# Patient Record
Sex: Male | Born: 1950 | Race: White | Hispanic: No | Smoking: Never smoker
Health system: Southern US, Community
[De-identification: ages and names within clinical notes are randomized; demographics above are authoritative.]

## PROBLEM LIST (undated history)

## (undated) DIAGNOSIS — Z9289 Personal history of other medical treatment: Secondary | ICD-10-CM

## (undated) DIAGNOSIS — E039 Hypothyroidism, unspecified: Secondary | ICD-10-CM

## (undated) DIAGNOSIS — F32A Depression, unspecified: Secondary | ICD-10-CM

## (undated) DIAGNOSIS — I712 Thoracic aortic aneurysm, without rupture, unspecified: Secondary | ICD-10-CM

## (undated) DIAGNOSIS — F1290 Cannabis use, unspecified, uncomplicated: Secondary | ICD-10-CM

## (undated) DIAGNOSIS — C8330 Diffuse large B-cell lymphoma, unspecified site: Secondary | ICD-10-CM

## (undated) DIAGNOSIS — R351 Nocturia: Secondary | ICD-10-CM

## (undated) DIAGNOSIS — F329 Major depressive disorder, single episode, unspecified: Secondary | ICD-10-CM

## (undated) DIAGNOSIS — I10 Essential (primary) hypertension: Secondary | ICD-10-CM

## (undated) DIAGNOSIS — I739 Peripheral vascular disease, unspecified: Secondary | ICD-10-CM

## (undated) DIAGNOSIS — Z9109 Other allergy status, other than to drugs and biological substances: Secondary | ICD-10-CM

## (undated) DIAGNOSIS — F419 Anxiety disorder, unspecified: Secondary | ICD-10-CM

## (undated) DIAGNOSIS — M549 Dorsalgia, unspecified: Secondary | ICD-10-CM

## (undated) DIAGNOSIS — Z9221 Personal history of antineoplastic chemotherapy: Secondary | ICD-10-CM

## (undated) DIAGNOSIS — D649 Anemia, unspecified: Secondary | ICD-10-CM

## (undated) DIAGNOSIS — Z8709 Personal history of other diseases of the respiratory system: Secondary | ICD-10-CM

## (undated) DIAGNOSIS — K5900 Constipation, unspecified: Secondary | ICD-10-CM

## (undated) DIAGNOSIS — R350 Frequency of micturition: Secondary | ICD-10-CM

## (undated) HISTORY — PX: RHINOPLASTY: SUR1284

## (undated) HISTORY — DX: Dorsalgia, unspecified: M54.9

## (undated) HISTORY — DX: Depression, unspecified: F32.A

## (undated) HISTORY — PX: OTHER SURGICAL HISTORY: SHX169

## (undated) HISTORY — DX: Major depressive disorder, single episode, unspecified: F32.9

## (undated) HISTORY — DX: Anxiety disorder, unspecified: F41.9

## (undated) HISTORY — PX: BONE MARROW BIOPSY: SHX199

## (undated) HISTORY — DX: Diffuse large B-cell lymphoma, unspecified site: C83.30

## (undated) HISTORY — PX: HERNIA REPAIR: SHX51

---

## 2012-06-04 ENCOUNTER — Ambulatory Visit (INDEPENDENT_AMBULATORY_CARE_PROVIDER_SITE_OTHER): Payer: BC Managed Care – PPO | Admitting: Psychiatry

## 2012-06-04 VITALS — BP 130/80 | HR 68 | Resp 12 | Ht 73.0 in | Wt 206.0 lb

## 2012-06-04 DIAGNOSIS — F4321 Adjustment disorder with depressed mood: Secondary | ICD-10-CM

## 2012-06-04 DIAGNOSIS — F4323 Adjustment disorder with mixed anxiety and depressed mood: Secondary | ICD-10-CM

## 2012-06-04 MED ORDER — LORAZEPAM 1 MG PO TABS: 1.0000 mg | ORAL_TABLET | Freq: Three times a day (TID) | ORAL | Status: DC
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
## 2012-06-25 ENCOUNTER — Telehealth (HOSPITAL_COMMUNITY): Payer: Self-pay

## 2012-06-25 NOTE — Telephone Encounter (Signed)
8:09am 06/25/12 Recd vm from 058/29 @ 5:10pm statign that pt needed to r/s an appt ..returned call but recd a fast busy..will try again later/sh

## 2012-07-13 ENCOUNTER — Ambulatory Visit (HOSPITAL_COMMUNITY): Payer: BC Managed Care – PPO | Admitting: Licensed Clinical Social Worker

## 2012-08-10 ENCOUNTER — Ambulatory Visit (INDEPENDENT_AMBULATORY_CARE_PROVIDER_SITE_OTHER): Payer: BC Managed Care – PPO | Admitting: Licensed Clinical Social Worker

## 2012-08-10 ENCOUNTER — Encounter (HOSPITAL_COMMUNITY): Payer: Self-pay | Admitting: Licensed Clinical Social Worker

## 2012-08-10 DIAGNOSIS — F3289 Other specified depressive episodes: Secondary | ICD-10-CM

## 2012-08-10 DIAGNOSIS — F1210 Cannabis abuse, uncomplicated: Secondary | ICD-10-CM

## 2012-08-10 DIAGNOSIS — F329 Major depressive disorder, single episode, unspecified: Secondary | ICD-10-CM

## 2012-08-10 DIAGNOSIS — F429 Obsessive-compulsive disorder, unspecified: Secondary | ICD-10-CM | POA: Insufficient documentation
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
## 2012-08-12 ENCOUNTER — Ambulatory Visit (HOSPITAL_COMMUNITY): Payer: BC Managed Care – PPO | Admitting: Licensed Clinical Social Worker

## 2012-08-31 ENCOUNTER — Ambulatory Visit (HOSPITAL_COMMUNITY): Payer: BC Managed Care – PPO | Admitting: Licensed Clinical Social Worker

## 2012-09-02 ENCOUNTER — Ambulatory Visit (INDEPENDENT_AMBULATORY_CARE_PROVIDER_SITE_OTHER): Payer: BC Managed Care – PPO | Admitting: Licensed Clinical Social Worker

## 2012-09-02 DIAGNOSIS — F429 Obsessive-compulsive disorder, unspecified: Secondary | ICD-10-CM

## 2012-09-02 DIAGNOSIS — F329 Major depressive disorder, single episode, unspecified: Secondary | ICD-10-CM

## 2012-09-02 NOTE — Progress Notes (Signed)
   THERAPIST PROGRESS NOTE  Session Time: 4:00pm-4:50pm  Participation Level: Active  Behavioral Response: Well GroomedAlertAnxious and Depressed  Type of Therapy: Individual Therapy  Treatment Goals addressed: Coping  Interventions: CBT, Motivational Interviewing, Strength-based, Supportive and Reframing  Summary: Mayjor Ager III is a 62 y.o. male who presents with depressed mood and anxious affect. He reports ongoing depression and anxiety. He processes his feelings of frustration about living with his sister, parenting his son who is not behaving responsibly and dealing with his girlfriend. He is not happy with his romantic relationship and processes the possibility of ending it. He questions his own desires and ability to set clear boundaries in relationships. He endorses strong feelings of guilt over his parents deaths and not being with them when they died. He processes his grief. His sleep and appetite are wnl.    Suicidal/Homicidal: Nowithout intent/plan  Therapist Response: Assessed patients current functioning and reviewed progress. Reviewed coping strategies. Assessed patients safety and assisted in identifying protective factors.  Reviewed crisis plan with patient. Assisted patient with the expression of anxiety. Reviewed patients self care plan. Assessed progress related to self care. Patients self care is good. Recommend daily exercise, increased socialization and recreation. Used motivational interviewing to assist and encourage patient through the change process. Explored patients barriers to change. Used CBT to assist patient with the identification of negative distortions and irrational thoughts. Encouraged patient to verbalize alternative and factual responses which challenge thought distortions. Used DBT to practice mindfulness, review distraction list and improve distress tolerance skills.   Plan: Return again in two weeks.  Diagnosis: Axis I: Depressive Disorder NOS  and Obsessive Compulsive Disorder    Axis II: No diagnosis    Orbin Mayeux, LCSW 09/02/2012

## 2012-09-15 ENCOUNTER — Ambulatory Visit (INDEPENDENT_AMBULATORY_CARE_PROVIDER_SITE_OTHER): Payer: BC Managed Care – PPO | Admitting: Psychiatry

## 2012-09-15 DIAGNOSIS — F4322 Adjustment disorder with anxiety: Secondary | ICD-10-CM

## 2012-09-15 MED ORDER — LORAZEPAM 1 MG PO TABS: 1.0000 mg | ORAL_TABLET | Freq: Three times a day (TID) | ORAL | Status: DC

## 2012-09-15 MED ORDER — ZOLPIDEM TARTRATE 10 MG PO TABS: 10.0000 mg | ORAL_TABLET | Freq: Every evening | ORAL | Status: DC | PRN

## 2012-09-15 NOTE — Progress Notes (Signed)
[facility] MD Progress Note  09/15/2012 3:40 PM Douglas Bryant  MRN:  [id number] Subjective:  Better Today the patient showed up on time and was seen alone. At this time he is somewhat better. As he says that he doesn't feels freaked out or as anxious as he was when he saw me last. He essentially is done better adjusted to his living circumstance living with his handicapped sister and his teenage son. The other dynamic course is that he was living in [place] near his girlfriend. He is somewhat ambivalent however about his girlfriend. Today the patient denies being severely depressed. He is inconsistent about how often he feels depressed. His anxiety seems to be somewhat stable at this time. He was having great problems sleeping but his primary care doctor gave him Ambien which helps him a great deal. Resonant patient is in therapy and is doing well with Orvan July. The patient who technology smoking marijuana on a rare basis as claim that is beginning to make him paranoia. He is now bleeding it is marijuana days are coming to an end. At this time his alcohol marijuana. The patient is eating well can concentrate without problems and has had no change in his energy level. He denies being suicidal. He still enjoys reading watching TV and occasionally seeing his girlfriend from [place]. Over the next few months the patient will be making some big decisions. Namely this is around the issue with his son. His son is 18 his footwear water regularly and apparently has not committed to school or to work. The patient is somewhat down on himself yet in reality I reviewed with him that he accident nothing wrong. In fact just the opposite. The patient is here because is the only way he can take care of his son and his sister.today reviewed all his medications and I fact offered to give him his Ambien. At this time he does not drink much alcohol at all. Not use any other drugs other than now reducing amounts of marijuana.  Patient also is to make a decision about his financial situation over the next few months. The patient is looking for primary care doctor at this time. The patient denies any shortness of breath or chest pain and denies any neurological symptoms. Diagnosis:   DSM5: Schizophrenia Disorders:   Obsessive-Compulsive Disorders:   Trauma-Stressor Disorders:   Substance/Addictive Disorders:   Depressive Disorders:    Axis I: Adjustment Disorder with Anxiety  ADL's:  Intact  Sleep: Fair  Appetite:  Good  Suicidal Ideation:  no Homicidal Ideation:  none AEB (as evidenced by):  Psychiatric Specialty Exam: ROS  Blood pressure 133/91, pulse 70, height 6\' 1"  (1.854 m), weight 199 lb 12.8 oz (90.629 kg).Body mass index is 26.37 kg/(m^2).  General Appearance: Casual  Eye Contact::  Good  Speech:  Normal Rate  Volume:  Normal  Mood:  Euthymic  Affect:  Appropriate  Thought Process:  Goal Directed  Orientation:  Full (Time, Place, and Person)  Thought Content:  WDL  Suicidal Thoughts:  No  Homicidal Thoughts:  No  Memory:  nl  Judgement:  Good  Insight:  Good  Psychomotor Activity:  Normal  Concentration:  Good  Recall:  Good  Akathisia:  No  Handed:  Right  AIMS (if indicated):     Assets:  Desire for Improvement  Sleep:      Current Medications: Current Outpatient Prescriptions  Medication Sig Dispense Refill  . LORazepam (ATIVAN) 1 MG tablet Take 1  tablet (1 mg total) by mouth 3 (three) times daily.  90 tablet  4  . LORazepam (ATIVAN) 1 MG tablet Take 1 tablet (1 mg total) by mouth 3 (three) times daily.  90 tablet  4  . zolpidem (AMBIEN) 10 MG tablet Take 1 tablet (10 mg total) by mouth at bedtime as needed for sleep.  30 tablet  4   No current facility-administered medications for this visit.    Lab Results: No results found for this or any previous visit (from the past 48 hour(s)).  Physical Findings: AIMS:  , ,  ,  ,    CIWA:    COWS:     Treatment Plan  Summary: At this time I will prescribe Ambien 10 mg at night. I will continue his Ativan 1 mg 3 times a day. The patient will continue in therapy. This patient return to see me in 3 months.  Plan:  Medical Decision Making Problem Points:  Established problem, stable/improving (1) Data Points:  Review of new medications or change in dosage (2)  I certify that inpatient services furnished can reasonably be expected to improve the patient's condition.   Kherington Meraz IRVING 09/15/2012, 3:40 PM

## 2012-09-16 ENCOUNTER — Ambulatory Visit (INDEPENDENT_AMBULATORY_CARE_PROVIDER_SITE_OTHER): Payer: BC Managed Care – PPO | Admitting: Licensed Clinical Social Worker

## 2012-09-16 DIAGNOSIS — F429 Obsessive-compulsive disorder, unspecified: Secondary | ICD-10-CM

## 2012-09-16 DIAGNOSIS — F4323 Adjustment disorder with mixed anxiety and depressed mood: Secondary | ICD-10-CM

## 2012-09-16 NOTE — Progress Notes (Signed)
   THERAPIST PROGRESS NOTE  Session Time: 4:00pm-4:50pm  Participation Level: Active  Behavioral Response: Well GroomedAlertAnxious  Type of Therapy: Individual Therapy  Treatment Goals addressed: Coping  Interventions: CBT, Motivational Interviewing, Strength-based, Supportive and Reframing  Summary: Lenon Kuennen III is a 62 y.o. male who presents with anxious mood and affect. He reports improvement in his depression and anxiety over the past few weeks. He is pleased to report that he did talk to his sister about the animals and express his concern and boundaries. He feels he made a small step and does endorse ongoing difficulty expressing his needs. He struggles with ambivalence in all his romantic relationships. Processed his difficulty letting past hurts go and how he obsesses over these. He plans to talk to his girlfriend about his feelings but often puts this off because it is not the "right time". His sleep and appetite are wnl.    Suicidal/Homicidal: Nowithout intent/plan  Therapist Response: Assessed patients current functioning and reviewed progress. Reviewed coping strategies. Assessed patients safety and assisted in identifying protective factors.  Reviewed crisis plan with patient. Assisted patient with the expression of anxiety. Reviewed patients self care plan. Assessed progress related to self care. Patients self care is improving . Recommend daily exercise, increased socialization and recreation. Used CBT to assist patient with the identification of negative distortions and irrational thoughts. Encouraged patient to verbalize alternative and factual responses which challenge thought distortions. Used motivational interviewing to assist and encourage patient through the change process. Explored patients barriers to change. Reviewed healthy boundaries and assertive communication.   Plan: Return again in three weeks.  Diagnosis: Axis I: Adjustment Disorder with Mixed Emotional  Features and Obsessive Compulsive Disorder    Axis II: No diagnosis    Jayle Solarz, LCSW 09/16/2012

## 2012-10-06 ENCOUNTER — Ambulatory Visit (INDEPENDENT_AMBULATORY_CARE_PROVIDER_SITE_OTHER): Payer: BC Managed Care – PPO | Admitting: Licensed Clinical Social Worker

## 2012-10-06 DIAGNOSIS — F429 Obsessive-compulsive disorder, unspecified: Secondary | ICD-10-CM

## 2012-10-06 DIAGNOSIS — F4323 Adjustment disorder with mixed anxiety and depressed mood: Secondary | ICD-10-CM

## 2012-10-06 NOTE — Progress Notes (Signed)
   THERAPIST PROGRESS NOTE  Session Time: 3:30pm-4:00pm  Participation Level: Active  Behavioral Response: Well GroomedAlertAnxious and Depressed  Type of Therapy: Individual Therapy  Treatment Goals addressed: Coping  Interventions: CBT, Motivational Interviewing, Strength-based, Supportive and Reframing  Summary: Arsalan Brisbin III is a 62 y.o. male who presents with depressed mood and anxious affect. He is late for his appointment because he thought it was at a different time. He reports ongoing frustration and anxiety related to the recent changes in his life, dealing with his son and caring for his sister. He continues to contemplate the relationship with his girlfriend and if this is what he wants. He is under financial stress and will need to get a job. His sleep and appetite are wnl.   Suicidal/Homicidal: Nowithout intent/plan  Therapist Response: Assessed patients current functioning and reviewed progress. Reviewed coping strategies. Assessed patients safety and assisted in identifying protective factors.  Reviewed crisis plan with patient. Assisted patient with the expression of anxiety. Reviewed patients self care plan. Assessed progress related to self care. Patients self care is fair. Recommend daily exercise, increased socialization and recreation. Used CBT to assist patient with the identification of negative distortions and irrational thoughts. Encouraged patient to verbalize alternative and factual responses which challenge thought distortions. Reviewed healthy boundaries and assertive communication. Used motivational interviewing to assist and encourage patient through the change process. Explored patients barriers to change.   Plan: Return again in three weeks.  Diagnosis: Axis I: Adjustment Disorder with Mixed Emotional Features and Obsessive Compulsive Disorder    Axis II: No diagnosis    Ezel Vallone, LCSW 10/06/2012

## 2012-10-27 ENCOUNTER — Ambulatory Visit (INDEPENDENT_AMBULATORY_CARE_PROVIDER_SITE_OTHER): Payer: BC Managed Care – PPO | Admitting: Licensed Clinical Social Worker

## 2012-10-27 DIAGNOSIS — F429 Obsessive-compulsive disorder, unspecified: Secondary | ICD-10-CM

## 2012-10-27 DIAGNOSIS — F329 Major depressive disorder, single episode, unspecified: Secondary | ICD-10-CM

## 2012-10-27 NOTE — Progress Notes (Signed)
   THERAPIST PROGRESS NOTE  Session Time: 4:00pm-4:50pm  Participation Level: Active  Behavioral Response: Fairly GroomedAlertAnxious and Depressed  Type of Therapy: Individual Therapy  Treatment Goals addressed: Coping  Interventions: CBT, Strength-based and Reframing  Summary: Douglas Bryant is a 62 y.o. male who presents with anxious mood and affect. Patient reports ongoing anxiety related to caring for his sister, his son and the house. He reports that he hoards and he feels he is unable to keep up with the upkeep of the home. He needs help, but has not asked for it. He reports feeling stuck and that he will have to care for his sister and live in this home for the rest of his life. He is unable to see choices in front of him. He reports ongoing depression about the state of his life and feels little control.    Suicidal/Homicidal: Nowithout intent/plan  Therapist Response: Assessed patients current functioning and reviewed progress. Reviewed coping strategies. Assessed patients safety and assisted in identifying protective factors.  Reviewed crisis plan with patient. Assisted patient with the expression of anxiety. Reviewed patients self care plan. Assessed progress related to self care. Patients self care is good. Recommend daily exercise, increased socialization and recreation. Used CBT to assist patient with the identification of negative distortions and irrational thoughts. Encouraged patient to verbalize alternative and factual responses which challenge thought distortions. Reviewed healthy boundaries and assertive communication. Used motivational interviewing to assist and encourage patient through the change process. Explored patients barriers to change.   Plan: Return again in three weeks.  Diagnosis: Axis I: Depressive Disorder NOS and Obsessive Compulsive Disorder    Axis II: No diagnosis    Adreena Willits, LCSW 10/27/2012

## 2012-11-10 ENCOUNTER — Ambulatory Visit (HOSPITAL_COMMUNITY): Payer: BC Managed Care – PPO | Admitting: Licensed Clinical Social Worker

## 2012-11-24 ENCOUNTER — Ambulatory Visit (INDEPENDENT_AMBULATORY_CARE_PROVIDER_SITE_OTHER): Payer: BC Managed Care – PPO | Admitting: Licensed Clinical Social Worker

## 2012-11-24 DIAGNOSIS — F329 Major depressive disorder, single episode, unspecified: Secondary | ICD-10-CM

## 2012-11-24 DIAGNOSIS — F429 Obsessive-compulsive disorder, unspecified: Secondary | ICD-10-CM

## 2012-11-24 NOTE — Progress Notes (Signed)
   THERAPIST PROGRESS NOTE  Session Time: 4:00pm-4:50pm  Participation Level: Active  Behavioral Response: Fairly GroomedAlertAnxious and Depressed  Type of Therapy: Individual Therapy  Treatment Goals addressed: Coping  Interventions: CBT, Motivational Interviewing, Strength-based, Assertiveness Training and Reframing  Summary: Douglas Bryant is a 62 y.o. male who presents with depressed mood and anxious affect. He reports no improvement in his degree of depression. Some days he does not leave the house and spends his time in bed. He is not motivated, feels overwhelmed and feels burdened and guilty about care taking for his sister, his son and managing the relationship with his girlfriend. He struggles to hold his son accountable and processed his difficulty with being assertive. His sleep and appetite are fair. Recommend patient discuss medication options at his next med check.   Suicidal/Homicidal: Nowithout intent/plan  Therapist Response: Assessed patients current functioning and reviewed progress. Reviewed coping strategies. Assessed patients safety and assisted in identifying protective factors.  Reviewed crisis plan with patient. Assisted patient with the expression of anxiety. Reviewed patients self care plan. Assessed progress related to self care. Patients self care is fair. Recommend daily exercise, increased socialization and recreation. Used CBT to assist patient with the identification of negative distortions and irrational thoughts. Encouraged patient to verbalize alternative and factual responses which challenge thought distortions. Used motivational interviewing to assist and encourage patient through the change process. Explored patients barriers to change. Reviewed healthy boundaries and assertive communication.   Plan: Return again in three weeks.  Diagnosis: Axis I: Depressive Disorder NOS and Obsessive Compulsive Disorder    Axis II: No  diagnosis    Kajsa Butrum, LCSW 11/24/2012

## 2012-12-17 ENCOUNTER — Ambulatory Visit (INDEPENDENT_AMBULATORY_CARE_PROVIDER_SITE_OTHER): Payer: BC Managed Care – PPO | Admitting: Psychiatry

## 2012-12-17 DIAGNOSIS — F4323 Adjustment disorder with mixed anxiety and depressed mood: Secondary | ICD-10-CM

## 2012-12-17 DIAGNOSIS — F4320 Adjustment disorder, unspecified: Secondary | ICD-10-CM

## 2012-12-17 MED ORDER — ZOLPIDEM TARTRATE 10 MG PO TABS: 10.0000 mg | ORAL_TABLET | Freq: Every evening | ORAL | Status: DC | PRN

## 2012-12-17 MED ORDER — LORAZEPAM 1 MG PO TABS: 1.0000 mg | ORAL_TABLET | Freq: Three times a day (TID) | ORAL | Status: DC

## 2012-12-17 NOTE — Progress Notes (Signed)
[facility] MD Progress Note  12/17/2012 11:42 AM Douglas Bryant  MRN:  [id number] Subjective: Feeling well Patient was seen one time. His son has graduated from high school and he is pleased. His son is going to look at her job. The patient's anxiety seems to be well-controlled. He continues in therapy at the Center. The patient is seeing his girlfriend this week and last week. Is not clear what is going to do ultimately with her. The patient has however that he has to look for part-time job. The patient seemed to be functioning well. Plays guitar rarely and reads. Watches TV. He is engaging and friendly. He denies the use of alcohol or any drugs. Overall he feels calm seems contented. He is sleeping very well on the Ambien. His appetite is good. This patient is not suicidal. Schizophrenia Disorders:   Obsessive-Compulsive Disorders:   Trauma-Stressor Disorders:   Substance/Addictive Disorders:   Depressive Disorders:    Axis I: Adjustment Disorder NOS  ADL's:  Intact  Sleep: Good  Appetite:  Good  Suicidal Ideation:  no Homicidal Ideation:  none AEB (as evidenced by):  Psychiatric Specialty Exam: ROS  There were no vitals taken for this visit.There is no weight on file to calculate BMI.  General Appearance: Fairly Groomed  Patent attorney::  Good  Speech:  Clear and Coherent  Volume:  Normal  Mood:  Euthymic  Affect:  Appropriate  Thought Process:  Coherent  Orientation:  Full (Time, Place, and Person)  Thought Content:  WDL  Suicidal Thoughts:  No  Homicidal Thoughts:  No  Memory:  nl  Judgement:  Good  Insight:  Good  Psychomotor Activity:  Normal  Concentration:  Good  Recall:  Good  Akathisia:  No  Handed:  Right  AIMS (if indicated):     Assets:  Communication Skills  Sleep:      Current Medications: Current Outpatient Prescriptions  Medication Sig Dispense Refill  . LORazepam (ATIVAN) 1 MG tablet Take 1 tablet (1 mg total) by mouth 3 (three) times daily.  90  tablet  4  . LORazepam (ATIVAN) 1 MG tablet Take 1 tablet (1 mg total) by mouth 3 (three) times daily.  90 tablet  5  . zolpidem (AMBIEN) 10 MG tablet Take 1 tablet (10 mg total) by mouth at bedtime as needed for sleep.  30 tablet  4  . zolpidem (AMBIEN) 10 MG tablet Take 1 tablet (10 mg total) by mouth at bedtime as needed for sleep.  30 tablet  5   No current facility-administered medications for this visit.    Lab Results: No results found for this or any previous visit (from the past 48 hour(s)).  Physical Findings: AIMS:  , ,  ,  ,    CIWA:    COWS:     Treatment Plan Summary: At this time I will go ahead and continue his Ativan 1 mg 3 times a day. It should be noted this gentleman has been taking this agent for over 2 decades. The patient also is taking Ambien and seems to get great benefit from it. The patient is not depressed. The patient continues in talking treatment and has a number of issues to deal with. The patient seems to have good insight and seems to benefit by being in therapy. This patient to return to see me in 4 months.  Plan:  Medical Decision Making Problem Points:  Established problem, stable/improving (1) Data Points:  Review of medication regiment & side  effects (2)  I certify that inpatient services furnished can reasonably be expected to improve the patient's condition.   Douglas Bryant 12/17/2012, 11:42 AM

## 2013-01-05 ENCOUNTER — Ambulatory Visit (INDEPENDENT_AMBULATORY_CARE_PROVIDER_SITE_OTHER): Payer: BC Managed Care – PPO | Admitting: Licensed Clinical Social Worker

## 2013-01-05 DIAGNOSIS — F4323 Adjustment disorder with mixed anxiety and depressed mood: Secondary | ICD-10-CM

## 2013-01-05 DIAGNOSIS — F1210 Cannabis abuse, uncomplicated: Secondary | ICD-10-CM

## 2013-01-06 NOTE — Progress Notes (Signed)
   THERAPIST PROGRESS NOTE  Session Time: 4:00pm-4:50pm Participation Level: Active  Behavioral Response: Fairly GroomedAlertAnxious  Type of Therapy: Individual Therapy  Treatment Goals addressed: Anxiety, Communication: expression of boundaries with his sister, his son and his girlfriend, Coping and Diagnosis: cannabis abuse  Interventions: CBT, Motivational Interviewing, Strength-based, Supportive and Reframing  Summary: Douglas Bryant is a 61 y.o. male who presents with anxious mood and affect. He reports that his son graduated from [employer] and he is pleased and surprised. He endorses increased anxiety due to finances and realizes he will have to return to work. He admits to daily cannabis use and has stopped smoking since Thanksgiving because he does not want to fail a drug test for employment. He reports smoking cannabis on an off since his 20's. He states that he has smoked it to calm him down and deal with physical symptoms, such as nausea and stomach pain. He admits that he drinks too much coffee which he knows increases his anxiety and then would smoke to decrease his anxiety. He demonstrates insight into his patterns of use and some commitment to change. He is focused on this goal of sobriety and anxiety reduction. His sleep and appetite are wnl.   Suicidal/Homicidal: Nowithout intent/plan  Therapist Response: Assessed patients current functioning and reviewed progress. Reviewed coping strategies. Assessed patients safety and assisted in identifying protective factors.  Reviewed crisis plan with patient. Assisted patient with the expression of anxeity. Reviewed patients self care plan. Assessed progress related to self care. Patients self care is fair, but improving . Recommend daily exercise, increased socialization and recreation. Used CBT to assist patient with the identification of negative distortions and irrational thoughts. Encouraged patient to verbalize alternative and  factual responses which challenge thought distortions. Reviewed healthy boundaries and assertive communication. Discussed recovery options and support for cannabis abuse.   Plan: Return again in four weeks.  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

## 2013-01-24 ENCOUNTER — Ambulatory Visit (HOSPITAL_COMMUNITY): Payer: BC Managed Care – PPO | Admitting: Licensed Clinical Social Worker

## 2013-02-07 ENCOUNTER — Ambulatory Visit (HOSPITAL_COMMUNITY): Payer: BC Managed Care – PPO | Admitting: Licensed Clinical Social Worker

## 2013-02-21 ENCOUNTER — Ambulatory Visit (INDEPENDENT_AMBULATORY_CARE_PROVIDER_SITE_OTHER): Payer: BC Managed Care – PPO | Admitting: Licensed Clinical Social Worker

## 2013-02-21 DIAGNOSIS — F3289 Other specified depressive episodes: Secondary | ICD-10-CM

## 2013-02-21 DIAGNOSIS — F329 Major depressive disorder, single episode, unspecified: Secondary | ICD-10-CM

## 2013-02-22 NOTE — Progress Notes (Signed)
   THERAPIST PROGRESS NOTE  Session Time: 4:00pm-4:50pm  Participation Level: Active  Behavioral Response: Fairly GroomedAlertAnxious and Depressed  Type of Therapy: Individual Therapy  Treatment Goals addressed: Coping  Interventions: CBT, Motivational Interviewing, Strength-based, Supportive and Reframing  Summary: Douglas Bryant is a 63 y.o. male who presents with depressed mood and anxious affect. He reports an increase in feelings of depression, with poor motivation, increased sleep and anhedonia. He finds completing every day tasks difficult and overwhelming. He reports that he will have to return to work and is anxious about this prospect. He is under financial strain and processes his fears. Informed patient of this writers departure. Will refer at next appointment.    Suicidal/Homicidal: Nowithout intent/plan  Therapist Response: Assessed patients current functioning and reviewed progress. Reviewed coping strategies. Assessed patients safety and assisted in identifying protective factors.  Reviewed crisis plan with patient. Assisted patient with the expression of anxiety. Reviewed patients self care plan. Assessed progress related to self care. Patients self care is fair. Recommend daily exercise, increased socialization and recreation. Used CBT to assist patient with the identification of negative distortions and irrational thoughts. Encouraged patient to verbalize alternative and factual responses which challenge thought distortions. Reviewed healthy boundaries and assertive communication. Used motivational interviewing to assist and encourage patient through the change process. Explored patients barriers to change.   Plan: Return again in two weeks.  Diagnosis: Axis I: Depressive Disorder NOS    Axis II: No diagnosis    Douglas Vanvranken, LCSW 02/22/2013

## 2013-03-03 ENCOUNTER — Ambulatory Visit (INDEPENDENT_AMBULATORY_CARE_PROVIDER_SITE_OTHER): Payer: BC Managed Care – PPO | Admitting: Psychiatry

## 2013-03-03 VITALS — BP 126/84 | HR 76 | Ht 72.0 in | Wt 198.4 lb

## 2013-03-03 DIAGNOSIS — F329 Major depressive disorder, single episode, unspecified: Secondary | ICD-10-CM

## 2013-03-03 DIAGNOSIS — F321 Major depressive disorder, single episode, moderate: Secondary | ICD-10-CM

## 2013-03-03 MED ORDER — BUPROPION HCL ER (XL) 150 MG PO TB24: 150.0000 mg | ORAL_TABLET | Freq: Every day | ORAL | Status: DC

## 2013-03-03 NOTE — Progress Notes (Signed)
[facility] MD Progress Note  03/03/2013 5:00 PM Douglas Bryant  MRN:  [id number] Subjective:  Depressed Today the patient shares that he now feels depressed persistently which is new. He says he sleeps okay but it's only because he takes his medicine to sleep that is Ambien. His appetite is just a bit down but enough that he has lost some weight. His energy level is lower. He is still thinking concentrating he still enjoys the TV and playing a part. The patient's self-worth seems to be reduced. He continues to deal with a variety of issues including the fact that his therapist now sleeping from the Center. He remains caught between his son and his girlfriend and his sister. The patient is not suicidal. He is not drinking alcohol. He uses no drugs. He remembers responding in the past Wellbutrin just some. Again we reviewed how SSRIs were intolerable for him. The patient remembers she could tolerate Wellbutrin but is not certain of this outcome. Diagnosis:   DSM5: Schizophrenia Disorders:   Obsessive-Compulsive Disorders:   Trauma-Stressor Disorders:   Substance/Addictive Disorders:   Depressive Disorders:  Major Depressive Disorder - Mild (296.21) Total Time spent with patient: 30 minutes  Axis I: Major Depression, single episode  ADL's:  Intact  Sleep: Good  Appetite:  Fair  Suicidal Ideation:  no Homicidal Ideation:  none AEB (as evidenced by):  Psychiatric Specialty Exam: Physical Exam  ROS  Blood pressure 126/84, pulse 76, height 6' (1.829 m), weight 198 lb 6.4 oz (89.994 kg).Body mass index is 26.9 kg/(m^2).  General Appearance: Casual  Eye Contact::  Good  Speech:  Clear and Coherent  Volume:  Normal  Mood:  Depressed  Affect:  Blunt  Thought Process:  Coherent  Orientation:  Full (Time, Place, and Person)  Thought Content:  WDL  Suicidal Thoughts:  No  Homicidal Thoughts:  No  Memory:  NA  Judgement:  Good  Insight:  Good  Psychomotor Activity:  Normal  Concentration:   Good  Recall:  Good  Fund of Knowledge:Good  Language: Good  Akathisia:  No  Handed:  Right  AIMS (if indicated):     Assets:  Communication Skills  Sleep:      Musculoskeletal: Strength & Muscle Tone:  Gait & Station:  Patient leans:   Current Medications: Current Outpatient Prescriptions  Medication Sig Dispense Refill  . buPROPion (WELLBUTRIN XL) 150 MG 24 hr tablet Take 1 tablet (150 mg total) by mouth daily. 1 qam for 1 week then 2 qam  60 tablet  5  . LORazepam (ATIVAN) 1 MG tablet Take 1 tablet (1 mg total) by mouth 3 (three) times daily.  90 tablet  4  . LORazepam (ATIVAN) 1 MG tablet Take 1 tablet (1 mg total) by mouth 3 (three) times daily.  90 tablet  5  . zolpidem (AMBIEN) 10 MG tablet Take 1 tablet (10 mg total) by mouth at bedtime as needed for sleep.  30 tablet  4  . zolpidem (AMBIEN) 10 MG tablet Take 1 tablet (10 mg total) by mouth at bedtime as needed for sleep.  30 tablet  5   No current facility-administered medications for this visit.    Lab Results: No results found for this or any previous visit (from the past 48 hour(s)).  Physical Findings: AIMS:  , ,  ,  ,    CIWA:    COWS:     Treatment Plan Summary: At this time we'll go ahead and begin him on  Wellbutrin. It is a very empirical trial. He'll start 150 mg XL in the morning for a week and then double. Ultimately the patient will absolute be reassigned to a new therapist. This patient is not suicidal.  Plan:  Medical Decision Making Problem Points:  Established problem, worsening (2) Data Points:  Review of new medications or change in dosage (2)  I certify that inpatient services furnished can reasonably be expected to improve the patient's condition.   Sumayah Bearse, [place] 03/03/2013, 5:00 PM

## 2013-03-07 ENCOUNTER — Ambulatory Visit (INDEPENDENT_AMBULATORY_CARE_PROVIDER_SITE_OTHER): Payer: BC Managed Care – PPO | Admitting: Licensed Clinical Social Worker

## 2013-03-07 DIAGNOSIS — F329 Major depressive disorder, single episode, unspecified: Secondary | ICD-10-CM

## 2013-03-07 DIAGNOSIS — F3289 Other specified depressive episodes: Secondary | ICD-10-CM

## 2013-03-08 NOTE — Progress Notes (Signed)
   THERAPIST PROGRESS NOTE  Session Time: 4:00pm-4:50pm  Participation Level: Active  Behavioral Response: Fairly GroomedAlertAnxious and Depressed  Type of Therapy: Individual Therapy  Treatment Goals addressed: Coping  Interventions: CBT, Motivational Interviewing, Strength-based, Supportive and Reframing  Summary: Douglas Bryant is a 63 y.o. male who presents with depressed mood and anxious affect. He reports ongoing depression and anxiety but is pleased that he has been started on Wellbutrin. He remains isolated in bed most days. He reports a lack of motivation and overall frustration with her life circumstances. Informed patient of this writers departure, processed work and discussed referral.    Suicidal/Homicidal: Nowithout intent/plan  Therapist Response: Assessed patients current functioning and reviewed progress. Reviewed coping strategies. Assessed patients safety and assisted in identifying protective factors.  Reviewed crisis plan with patient. Assisted patient with the expression of frustration with his current living situation. Reviewed patients self care plan. Assessed progress related to self care. Patients self care is poor. Recommend daily exercise, increased socialization and recreation. Used CBT to assist patient with the identification of negative distortions and irrational thoughts. Encouraged patient to verbalize alternative and factual responses which challenge thought distortions. Reviewed healthy boundaries and assertive communication. Used motivational interviewing to assist and encourage patient through the change process. Explored patients barriers to change.   Plan: Referred to the [place] office since it is closer to his home.   Diagnosis: Axis I: Depressive Disorder NOS    Axis II: No diagnosis    Chenille Toor, LCSW 03/08/2013

## 2013-04-20 ENCOUNTER — Ambulatory Visit (HOSPITAL_COMMUNITY): Payer: BC Managed Care – PPO | Admitting: Psychiatry

## 2013-05-11 ENCOUNTER — Ambulatory Visit (INDEPENDENT_AMBULATORY_CARE_PROVIDER_SITE_OTHER): Payer: BC Managed Care – PPO | Admitting: Psychiatry

## 2013-05-11 VITALS — BP 126/78 | HR 84 | Ht 73.0 in | Wt 189.0 lb

## 2013-05-11 DIAGNOSIS — F341 Dysthymic disorder: Secondary | ICD-10-CM

## 2013-05-11 DIAGNOSIS — F339 Major depressive disorder, recurrent, unspecified: Secondary | ICD-10-CM

## 2013-05-11 MED ORDER — DESIPRAMINE HCL 25 MG PO TABS: 25.0000 mg | ORAL_TABLET | Freq: Every day | ORAL | Status: DC

## 2013-05-11 MED ORDER — LORAZEPAM 1 MG PO TABS: 1.0000 mg | ORAL_TABLET | Freq: Three times a day (TID) | ORAL | Status: DC

## 2013-05-11 MED ORDER — ZOLPIDEM TARTRATE 10 MG PO TABS
10.0000 mg | ORAL_TABLET | Freq: Every evening | ORAL | Status: DC | PRN
Start: 2013-05-11 — End: 2013-06-23

## 2013-05-11 NOTE — Progress Notes (Signed)
[facility] MD Progress Note  05/11/2013 3:56 PM Douglas Bryant  MRN:  [id number] Subjective:  No change The patient clearly seems to describe persistent daily depression. He describes it as an unusual unnoted needed mood state. His sleep is poor get up because he's urinating multiple times through the night. He knows he has a prostate problem. Therefore his sleep is fragmented but it's due to a problem with urination. The patient is eating well but has a reduction in his energy. He can think and concentrate fairly well his self-esteem is clearly reduced but is not quite worthless. The patient denies being suicidal. He still enjoys playing the guitar the harmonic. He reads and watches TV. He continues to have an ongoing issue with his girlfriend. He sees himself dividing himself between his 65 year old son who seems to be drug addicted, his handicapped sister who he lives with a girlfriend who lives in [place] who can quite move towards him. This patient again has been on multiple medications. He had side effects from Zoloft, Effexor, Cymbalta and now Wellbutrin. Strictly speaking he does not meet the criteria of major depression other than perhaps equivocally. I do not think he is a candidate for [place] but that is not completely out of the question. I think to get [place] he should have a definitive diagnosis of major depression and I am not sure of that. I would like to see the patient after he was sleeping better once he sees a urologist for his urinary frequency. The patient also is going to start in therapy with Anderson Malta brown. I would like to give that some time as well. Nonetheless I will make what may be my last pharmacological intervention. It should be noted this patient continues take Ativan 1 mg 3 times a day and has been on for well over a decade. He'll continue Ambien 10 mg. Diagnosis:   DSM5: Schizophrenia Disorders:   Obsessive-Compulsive Disorders:   Trauma-Stressor Disorders:   Substance/Addictive  Disorders:   Depressive Disorders:  Major Depressive Disorder - Mild (296.21) Total Time spent with patient: 30 minutes  Axis I: Dysthymic Disorder  ADL's:  Intact  Sleep: Fair  Appetite:  Good  Suicidal Ideation:  no Homicidal Ideation:  none AEB (as evidenced by):  Psychiatric Specialty Exam: Physical Exam  ROS  Blood pressure 126/78, pulse 84, height 6\' 1"  (1.854 m), weight 189 lb (85.73 kg).Body mass index is 24.94 kg/(m^2).  General Appearance: Casual  Eye Contact::  Good  Speech:  Clear and Coherent  Volume:  Normal  Mood:  Euthymic  Affect:  Appropriate  Thought Process:  Coherent  Orientation:  Full (Time, Place, and Person)  Thought Content:  WDL  Suicidal Thoughts:  No  Homicidal Thoughts:  No  Memory:  NA  Judgement:  Good  Insight:  Fair  Psychomotor Activity:  Normal  Concentration:  Good  Recall:  Good  Fund of Knowledge:Good  Language: Good  Akathisia:  No  Handed:  Right  AIMS (if indicated):     Assets:  Desire for Improvement  Sleep:      Musculoskeletal: Strength & Muscle Tone:  Gait & Station:  Patient leans:   Current Medications: Current Outpatient Prescriptions  Medication Sig Dispense Refill  . buPROPion (WELLBUTRIN XL) 150 MG 24 hr tablet Take 1 tablet (150 mg total) by mouth daily. 1 qam for 1 week then 2 qam  60 tablet  5  . desipramine (NORPRAMIN) 25 MG tablet Take 1 tablet (25 mg total) by  mouth daily.  30 tablet  5  . LORazepam (ATIVAN) 1 MG tablet Take 1 tablet (1 mg total) by mouth 3 (three) times daily.  90 tablet  4  . LORazepam (ATIVAN) 1 MG tablet Take 1 tablet (1 mg total) by mouth 3 (three) times daily.  90 tablet  5  . zolpidem (AMBIEN) 10 MG tablet Take 1 tablet (10 mg total) by mouth at bedtime as needed for sleep.  30 tablet  5  . zolpidem (AMBIEN) 10 MG tablet Take 1 tablet (10 mg total) by mouth at bedtime as needed for sleep.  30 tablet  4   No current facility-administered medications for this visit.    Lab  Results: No results found for this or any previous visit (from the past 48 hour(s)).  Physical Findings: AIMS:  , ,  ,  ,    CIWA:    COWS:     Treatment Plan Summary: At this time we will officially discontinue his Wellbutrin. Today the patient will start on 25 mg of desipramine in the morning. The patient was told that he should call us if he has side effects. She'll return to see me in 6 weeks. If in fact he is not better I would get a dizzy of her mean blood level. At this time he will start up with Anderson Malta brown in therapy, contact a urologist about his urinary problems and continue taking Ativan 1 mg 3 times a day and Ambien 10 mg.  Plan:  Medical Decision Making Problem Points:  Established problem, worsening (2) Data Points:  Review of medication regiment & side effects (2)  I certify that inpatient services furnished can reasonably be expected to improve the patient's condition.   Norma Fredrickson 05/11/2013, 3:56 PM

## 2013-05-19 ENCOUNTER — Telehealth (HOSPITAL_COMMUNITY): Payer: Self-pay | Admitting: *Deleted

## 2013-05-19 NOTE — Telephone Encounter (Signed)
Patient left VM Was prescribed Norpramin last week.Having angry and disturbing thoughts all the time,every day since he started. Should he stay on it? Contacted patient: Advised him if side effects are uncomfortable, can be stopped.

## 2013-06-22 ENCOUNTER — Other Ambulatory Visit (HOSPITAL_COMMUNITY): Payer: Self-pay | Admitting: Internal Medicine

## 2013-06-22 DIAGNOSIS — C8590 Non-Hodgkin lymphoma, unspecified, unspecified site: Secondary | ICD-10-CM

## 2013-06-23 ENCOUNTER — Encounter (HOSPITAL_COMMUNITY): Payer: Self-pay | Admitting: Pharmacy Technician

## 2013-06-24 ENCOUNTER — Ambulatory Visit (INDEPENDENT_AMBULATORY_CARE_PROVIDER_SITE_OTHER): Payer: BC Managed Care – PPO | Admitting: Psychiatry

## 2013-06-24 ENCOUNTER — Other Ambulatory Visit (HOSPITAL_COMMUNITY): Payer: Self-pay | Admitting: Internal Medicine

## 2013-06-24 DIAGNOSIS — F3289 Other specified depressive episodes: Secondary | ICD-10-CM

## 2013-06-24 DIAGNOSIS — C8590 Non-Hodgkin lymphoma, unspecified, unspecified site: Secondary | ICD-10-CM

## 2013-06-24 DIAGNOSIS — F329 Major depressive disorder, single episode, unspecified: Secondary | ICD-10-CM

## 2013-06-24 MED ORDER — LORAZEPAM 1 MG PO TABS: 1.0000 mg | ORAL_TABLET | Freq: Three times a day (TID) | ORAL | Status: DC

## 2013-06-24 NOTE — Progress Notes (Signed)
[facility] MD Progress Note  06/24/2013 9:09 AM Douglas Bryant  MRN:  [id number] Subjective:  Got some bad news Today the patient described that after his last visit he seemed to get more and more depressed. He felt weaker and weaker and when he would get up to urinate at night he started feeling dizzy and couldn't walk. Eventually he was medically hospitalized and found to be severely anemic. He received transfusions and felt much better. In that medical hospitalization he began a diagnostic process where they concluded that he has non-Hodgkin's lymphoma. They suggest chemotherapy in the coming months. At this time he still being staged. Over the next week or 2 he is getting a PET scan and other diagnostic tests. Interestingly the patient's mood does not seem to be any different. In fact in some ways his mood seems a bit better. Once he became medically ill his family he claims  ralleyed  around him. An essential issue was that he felt unsupported as he come to his community to live in his home with his handicapped sister. He also was very frustrated with his teenage son who is not doing well. Once he became physically ill his other 2 sisters came to have been caring for him in an erratic but meaningful way. Also his girlfriend had come back and was supportive. In essence he experienced a great deal of support. The patient says that overall he sleeping fairly well. He still gets up 2 or 3 times a night to urinate. This is still somewhat of a problem. Apparently months ago at some point he did have abdominal pain and nausea and that now seems to be resolved. While he claims that he still has an appetite and he eats a moderate amount of food he has lost over 20 pounds in the last few months. His energy level is the biggest problem. It is distinctly low. The patient stopped all alcohol use. The patient still is able to enjoy guitar watching TV and reading. The patient is not suicidal. The patient is coughing through  this interview. The patient did give Disipramine a trial but he said it made him feel curable and angry. He is no other physical side effects from it. The patient has an appointment on June 22 with Jennifer brown. Overall his anxiety seems to be well-controlled. Is not psychotic. Diagnosis:   DSM5: Schizophrenia Disorders:   Obsessive-Compulsive Disorders:   Trauma-Stressor Disorders:   Substance/Addictive Disorders:   Depressive Disorders:  Major Depressive Disorder - Mild (296.21) and Major Depressive Disorder - Unspecified (296.20) Total Time spent with patient: 30 minutes  Axis I: Depressive Disorder NOS  ADL's:  Intact  Sleep: Good  Appetite:  Fair  Suicidal Ideation:  no Homicidal Ideation:  none AEB (as evidenced by):  Psychiatric Specialty Exam: Physical Exam  ROS  There were no vitals taken for this visit.There is no weight on file to calculate BMI.  General Appearance: Disheveled  Eye Contact::  Good  Speech:  NA  Volume:  Normal  Mood:  Euthymic  Affect: NL  Thought Process:  Coherent  Orientation:  Full (Time, Place, and Person)  Thought Content:  WDL  Suicidal Thoughts:  No  Homicidal Thoughts:  No  Memory:  NA  Judgement:  Good  Insight:  Good  Psychomotor Activity:  Normal  Concentration:  Good  Recall:  Good  Fund of Knowledge:Good  Language: Good  Akathisia:  No  Handed:  Right  AIMS (if indicated):  Assets:  Communication Skills  Sleep:      Musculoskeletal: Strength & Muscle Tone:  Gait & Station:  Patient leans:   Current Medications: Current Outpatient Prescriptions  Medication Sig Dispense Refill  . levofloxacin (LEVAQUIN) 500 MG tablet Take 500 mg by mouth daily. 10 day course started 06/20/13      . LORazepam (ATIVAN) 1 MG tablet Take 1 tablet (1 mg total) by mouth 3 (three) times daily.  90 tablet  4  . pantoprazole (PROTONIX) 40 MG tablet Take 40 mg by mouth daily.      . valACYclovir (VALTREX) 500 MG tablet Take 500 mg by  mouth daily as needed (Herpes Virus).      . zolpidem (AMBIEN) 10 MG tablet Take 1 tablet (10 mg total) by mouth at bedtime as needed for sleep.  30 tablet  5   No current facility-administered medications for this visit.    Lab Results: No results found for this or any previous visit (from the past 48 hour(s)).  Physical Findings: AIMS:  , ,  ,  ,    CIWA:    COWS:     Treatment Plan Summary: At this time this patient she'll continue taking his Ativan 1 mg 3 times at night. He no longer takes Ambien at this time we will not be offering an antidepressant. At this time I'll attempt to support him and follow him. I strongly encouraged him to keep his June 22 therapy appointment. I not certain that antidepressants are to be relevant in this patient. The possibility of using nortriptyline is not out of the question. I suspect that the Cipro me was too stimulating and resulted in irritability. Nonetheless he has a lot of things are going happen in the next few months including the completion of the diagnostic staging process in the beginning of chemotherapy. This patient to return to see me in 3 months.   Plan:  Medical Decision Making Problem Points:  Established problem, worsening (2) Data Points:  Review of medication regiment & side effects (2)  I certify that inpatient services furnished can reasonably be expected to improve the patient's condition.   Berneta Sages Glenys Snader 06/24/2013, 9:09 AM

## 2013-06-27 ENCOUNTER — Inpatient Hospital Stay (HOSPITAL_COMMUNITY): Admission: RE | Admit: 2013-06-27 | Payer: BC Managed Care – PPO | Source: Ambulatory Visit

## 2013-06-27 ENCOUNTER — Other Ambulatory Visit: Payer: Self-pay | Admitting: Radiology

## 2013-06-29 ENCOUNTER — Encounter (HOSPITAL_COMMUNITY): Payer: Self-pay

## 2013-06-29 ENCOUNTER — Ambulatory Visit (HOSPITAL_COMMUNITY)
Admission: RE | Admit: 2013-06-29 | Discharge: 2013-06-29 | Disposition: A | Discharge status: Home / Self Care | Payer: Medicare Other | Source: Ambulatory Visit | Attending: Internal Medicine | Admitting: Internal Medicine

## 2013-06-29 ENCOUNTER — Other Ambulatory Visit (HOSPITAL_COMMUNITY): Payer: Self-pay | Admitting: Internal Medicine

## 2013-06-29 ENCOUNTER — Ambulatory Visit (HOSPITAL_COMMUNITY)
Admission: RE | Admit: 2013-06-29 | Discharge: 2013-06-29 | Disposition: A | Discharge status: Home / Self Care | Payer: BC Managed Care – PPO | Source: Ambulatory Visit | Attending: Internal Medicine | Admitting: Internal Medicine

## 2013-06-29 DIAGNOSIS — F329 Major depressive disorder, single episode, unspecified: Secondary | ICD-10-CM | POA: Insufficient documentation

## 2013-06-29 DIAGNOSIS — C8590 Non-Hodgkin lymphoma, unspecified, unspecified site: Secondary | ICD-10-CM

## 2013-06-29 DIAGNOSIS — F411 Generalized anxiety disorder: Secondary | ICD-10-CM | POA: Insufficient documentation

## 2013-06-29 DIAGNOSIS — F3289 Other specified depressive episodes: Secondary | ICD-10-CM | POA: Insufficient documentation

## 2013-06-29 DIAGNOSIS — C8589 Other specified types of non-Hodgkin lymphoma, extranodal and solid organ sites: Secondary | ICD-10-CM | POA: Insufficient documentation

## 2013-06-29 DIAGNOSIS — I10 Essential (primary) hypertension: Secondary | ICD-10-CM | POA: Insufficient documentation

## 2013-06-29 DIAGNOSIS — Z79899 Other long term (current) drug therapy: Secondary | ICD-10-CM | POA: Insufficient documentation

## 2013-06-29 LAB — CBC WITH DIFFERENTIAL/PLATELET
BASOS ABS: 0 10*3/uL (ref 0.0–0.1)
BASOS PCT: 0 % (ref 0–1)
EOS ABS: 0 10*3/uL (ref 0.0–0.7)
EOS PCT: 0 % (ref 0–5)
HCT: 28.6 % — ABNORMAL LOW (ref 39.0–52.0)
Hemoglobin: 9.2 g/dL — ABNORMAL LOW (ref 13.0–17.0)
Lymphocytes Relative: 11 % — ABNORMAL LOW (ref 12–46)
Lymphs Abs: 0.8 10*3/uL (ref 0.7–4.0)
MCH: 26.1 pg (ref 26.0–34.0)
MCHC: 32.2 g/dL (ref 30.0–36.0)
MCV: 81.3 fL (ref 78.0–100.0)
Monocytes Absolute: 0.6 10*3/uL (ref 0.1–1.0)
Monocytes Relative: 9 % (ref 3–12)
Neutro Abs: 5.9 10*3/uL (ref 1.7–7.7)
Neutrophils Relative %: 80 % — ABNORMAL HIGH (ref 43–77)
PLATELETS: 383 10*3/uL (ref 150–400)
RBC: 3.52 MIL/uL — ABNORMAL LOW (ref 4.22–5.81)
RDW: 15.7 % — ABNORMAL HIGH (ref 11.5–15.5)
WBC: 7.3 10*3/uL (ref 4.0–10.5)

## 2013-06-29 LAB — PROTIME-INR
INR: 1.09 (ref 0.00–1.49)
PROTHROMBIN TIME: 13.9 s (ref 11.6–15.2)

## 2013-06-29 MED ORDER — SODIUM CHLORIDE 0.9 % IV SOLN
INTRAVENOUS | Status: DC
  Administered 2013-06-29: 12:00:00 via INTRAVENOUS

## 2013-06-29 MED ORDER — MIDAZOLAM HCL 2 MG/2ML IJ SOLN
INTRAMUSCULAR | Status: AC | PRN
  Administered 2013-06-29: 1 mg via INTRAVENOUS
  Administered 2013-06-29: 2 mg via INTRAVENOUS
  Administered 2013-06-29: 1 mg via INTRAVENOUS

## 2013-06-29 MED ORDER — LIDOCAINE-EPINEPHRINE (PF) 2 %-1:200000 IJ SOLN
INTRAMUSCULAR | Status: AC
  Filled 2013-06-29: qty 20

## 2013-06-29 MED ORDER — MIDAZOLAM HCL 2 MG/2ML IJ SOLN
INTRAMUSCULAR | Status: AC
  Filled 2013-06-29: qty 4

## 2013-06-29 MED ORDER — FENTANYL CITRATE 0.05 MG/ML IJ SOLN
INTRAMUSCULAR | Status: AC | PRN
  Administered 2013-06-29 (×2): 50 ug via INTRAVENOUS
  Administered 2013-06-29: 100 ug via INTRAVENOUS

## 2013-06-29 MED ORDER — HEPARIN SOD (PORK) LOCK FLUSH 100 UNIT/ML IV SOLN
500.0000 [IU] | Freq: Once | INTRAVENOUS | Status: AC
  Administered 2013-06-29: 500 [IU] via INTRAVENOUS

## 2013-06-29 MED ORDER — HEPARIN SOD (PORK) LOCK FLUSH 100 UNIT/ML IV SOLN
INTRAVENOUS | Status: AC
  Filled 2013-06-29: qty 5

## 2013-06-29 MED ORDER — FENTANYL CITRATE 0.05 MG/ML IJ SOLN
INTRAMUSCULAR | Status: AC
  Filled 2013-06-29: qty 4

## 2013-06-29 MED ORDER — LIDOCAINE HCL 1 % IJ SOLN
INTRAMUSCULAR | Status: AC
  Filled 2013-06-29: qty 20

## 2013-06-29 MED ORDER — CEFAZOLIN SODIUM-DEXTROSE 2-3 GM-% IV SOLR
2.0000 g | INTRAVENOUS | Status: AC
Start: 2013-06-29 — End: 2013-06-29
  Administered 2013-06-29: 2 g via INTRAVENOUS
  Filled 2013-06-29: qty 50

## 2013-06-29 NOTE — H&P (Signed)
Chief Complaint: "I'm here for a portacath" Referring Physician:Darovsky HPI: Douglas Bryant is an 63 y.o. male with new diagnosis of lymphoma. He is set to begin chemotherapy next week and is scheduled today for [facility] placement. PMHx and current med list reviewed. Pt feels well today, no recent fevers, chills, illness.  Past Medical History:  Past Medical History  Diagnosis Date  . Anxiety   . Depression   . HTN (hypertension)   . Back pain     Past Surgical History:  Past Surgical History  Procedure Laterality Date  . Hernia repair      Family History:  Family History  Problem Relation Age of Onset  . Alcohol abuse Father   . Depression Sister   . Anxiety disorder Sister   . Depression Sister   . Anxiety disorder Sister     Social History:  reports that he has never smoked. He does not have any smokeless tobacco history on file. He reports that he drinks about 1.8 ounces of alcohol per week. He reports that he uses illicit drugs (Marijuana).  Allergies: No Known Allergies  Medications:   Medication List    ASK your doctor about these medications       levofloxacin 500 MG tablet  Commonly known as:  LEVAQUIN  Take 500 mg by mouth daily. 10 day course started 06/20/13     LORazepam 1 MG tablet  Commonly known as:  ATIVAN  Take 1 tablet (1 mg total) by mouth 3 (three) times daily.     pantoprazole 40 MG tablet  Commonly known as:  PROTONIX  Take 40 mg by mouth daily.     valACYclovir 500 MG tablet  Commonly known as:  VALTREX  Take 500 mg by mouth daily as needed (Herpes Virus).     zolpidem 10 MG tablet  Commonly known as:  AMBIEN  Take 1 tablet (10 mg total) by mouth at bedtime as needed for sleep.        Please HPI for pertinent positives, otherwise complete 10 system ROS negative.  Physical Exam: Temp: 97.3, HR: 78, RR: 18, BP: 107/71   General Appearance:  Alert, cooperative, no distress, appears stated age  Head:  Normocephalic, without  obvious abnormality, atraumatic  ENT: Unremarkable  Neck: Supple, symmetrical, trachea midline  Lungs:   Clear to auscultation bilaterally, no w/r/r, respirations unlabored without use of accessory muscles.  Chest Wall:  No tenderness or deformity  Heart:  Regular rate and rhythm, S1, S2 normal, no murmur, rub or gallop.  Neurologic: Normal affect, no gross deficits.   Results for orders placed during the hospital encounter of 06/29/13 (from the past 48 hour(s))  CBC WITH DIFFERENTIAL     Status: Abnormal   Collection Time    06/29/13 11:54 AM      Result Value Ref Range   WBC 7.3  4.0 - 10.5 K/uL   RBC 3.52 (*) 4.22 - 5.81 MIL/uL   Hemoglobin 9.2 (*) 13.0 - 17.0 g/dL   HCT 28.6 (*) 39.0 - 52.0 %   MCV 81.3  78.0 - 100.0 fL   MCH 26.1  26.0 - 34.0 pg   MCHC 32.2  30.0 - 36.0 g/dL   RDW 15.7 (*) 11.5 - 15.5 %   Platelets 383  150 - 400 K/uL   Neutrophils Relative % 80 (*) 43 - 77 %   Neutro Abs 5.9  1.7 - 7.7 K/uL   Lymphocytes Relative 11 (*) 12 - 46 %  Lymphs Abs 0.8  0.7 - 4.0 K/uL   Monocytes Relative 9  3 - 12 %   Monocytes Absolute 0.6  0.1 - 1.0 K/uL   Eosinophils Relative 0  0 - 5 %   Eosinophils Absolute 0.0  0.0 - 0.7 K/uL   Basophils Relative 0  0 - 1 %   Basophils Absolute 0.0  0.0 - 0.1 K/uL  PROTIME-INR     Status: None   Collection Time    06/29/13 11:54 AM      Result Value Ref Range   Prothrombin Time 13.9  11.6 - 15.2 seconds   INR 1.09  0.00 - 1.49   No results found.  Assessment/Plan Lymphoma For Port placement today Discussed procedure, risks, complications, use of sedation Labs reviewed, ok Consent signed in chart  Ascencion Dike PA-C 06/29/2013, 1:20 PM

## 2013-06-29 NOTE — Procedures (Signed)
Placement of right jugular port.  Tip in lower SVC.  No immediate complication.

## 2013-06-29 NOTE — Discharge Instructions (Signed)
Implanted Port Home Guide °An implanted port is a type of central line that is placed under the skin. Central lines are used to provide IV access when treatment or nutrition needs to be given through a person's veins. Implanted ports are used for long-term IV access. An implanted port may be placed because:  °· You need IV medicine that would be irritating to the small veins in your hands or arms.   °· You need long-term IV medicines, such as antibiotics.   °· You need IV nutrition for a long period.   °· You need frequent blood draws for lab tests.   °· You need dialysis.   °Implanted ports are usually placed in the chest area, but they can also be placed in the upper arm, the abdomen, or the leg. An implanted port has two main parts:  °· Reservoir. The reservoir is round and will appear as a small, raised area under your skin. The reservoir is the part where a needle is inserted to give medicines or draw blood.   °· Catheter. The catheter is a thin, flexible tube that extends from the reservoir. The catheter is placed into a large vein. Medicine that is inserted into the reservoir goes into the catheter and then into the vein.   °HOW WILL I CARE FOR MY INCISION SITE? °Do not get the incision site wet. Bathe or shower as directed by your health care provider.  °HOW IS MY PORT ACCESSED? °Special steps must be taken to access the port:  °· Before the port is accessed, a numbing cream can be placed on the skin. This helps numb the skin over the port site.   °· Your health care provider uses a sterile technique to access the port. °· Your health care provider must put on a mask and sterile gloves. °· The skin over your port is cleaned carefully with an antiseptic and allowed to dry. °· The port is gently pinched between sterile gloves, and a needle is inserted into the port. °· Only "non-coring" port needles should be used to access the port. Once the port is accessed, a blood return should be checked. This helps  ensure that the port is in the vein and is not clogged.   °· If your port needs to remain accessed for a constant infusion, a clear (transparent) bandage will be placed over the needle site. The bandage and needle will need to be changed every week, or as directed by your health care provider.   °· Keep the bandage covering the needle clean and dry. Do not get it wet. Follow your health care provider's instructions on how to take a shower or bath while the port is accessed.   °· If your port does not need to stay accessed, no bandage is needed over the port.   °WHAT IS FLUSHING? °Flushing helps keep the port from getting clogged. Follow your health care provider's instructions on how and when to flush the port. Ports are usually flushed with saline solution or a medicine called heparin. The need for flushing will depend on how the port is used.  °· If the port is used for intermittent medicines or blood draws, the port will need to be flushed:   °· After medicines have been given.   °· After blood has been drawn.   °· As part of routine maintenance.   °· If a constant infusion is running, the port may not need to be flushed.   °HOW LONG WILL MY PORT STAY IMPLANTED? °The port can stay in for as long as your health care   provider thinks it is needed. When it is time for the port to come out, surgery will be done to remove it. The procedure is similar to the one performed when the port was put in.  °WHEN SHOULD I SEEK IMMEDIATE MEDICAL CARE? °When you have an implanted port, you should seek immediate medical care if:  °· You notice a bad smell coming from the incision site.   °· You have swelling, redness, or drainage at the incision site.   °· You have more swelling or pain at the port site or the surrounding area.   °· You have a fever that is not controlled with medicine. °Document Released: 01/13/2005 Document Revised: 11/03/2012 Document Reviewed: 09/20/2012 °[employer]® Patient Information ©2014 [employer]. ° °Conscious Sedation, Adult, Care After °Refer to this sheet in the next few weeks. These instructions provide you with information on caring for yourself after your procedure. Your health care provider may also give you more specific instructions. Your treatment has been planned according to current medical practices, but problems sometimes occur. Call your health care provider if you have any problems or questions after your procedure. °WHAT TO EXPECT AFTER THE PROCEDURE  °After your procedure: °· You may feel sleepy, clumsy, and have poor balance for several hours. °· Vomiting may occur if you eat too soon after the procedure. °HOME CARE INSTRUCTIONS °· Do not participate in any activities where you could become injured for at least 24 hours. Do not: °· Drive. °· Swim. °· Ride a bicycle. °· Operate heavy machinery. °· Cook. °· Use power tools. °· Climb ladders. °· Work from a high place. °· Do not make important decisions or sign legal documents until you are improved. °· If you vomit, drink water, juice, or soup when you can drink without vomiting. Make sure you have little or no nausea before eating solid foods. °· Only take over-the-counter or prescription medicines for pain, discomfort, or fever as directed by your health care provider. °· Make sure you and your family fully understand everything about the medicines given to you, including what side effects may occur. °· You should not drink alcohol, take sleeping pills, or take medicines that cause drowsiness for at least 24 hours. °· If you smoke, do not smoke without supervision. °· If you are feeling better, you may resume normal activities 24 hours after you were sedated. °· Keep all appointments with your health care provider. °SEEK MEDICAL CARE IF: °· Your skin is pale or bluish in color. °· You continue to feel nauseous or vomit. °· Your pain is getting worse and is not helped by medicine. °· You have bleeding or swelling. °· You are still  sleepy or feeling clumsy after 24 hours. °SEEK IMMEDIATE MEDICAL CARE IF: °· You develop a rash. °· You have difficulty breathing. °· You develop any type of allergic problem. °· You have a fever. °MAKE SURE YOU: °· Understand these instructions. °· Will watch your condition. °· Will get help right away if you are not doing well or get worse. °Document Released: 11/03/2012 Document Reviewed: 08/20/2012 °ExitCare® Patient Information ©2014 [employer]. ° °

## 2013-07-05 ENCOUNTER — Other Ambulatory Visit: Payer: Self-pay | Admitting: Radiology

## 2013-07-05 ENCOUNTER — Ambulatory Visit (HOSPITAL_COMMUNITY): Payer: BC Managed Care – PPO

## 2013-07-06 ENCOUNTER — Encounter (HOSPITAL_COMMUNITY): Payer: Self-pay | Admitting: Pharmacy Technician

## 2013-07-07 ENCOUNTER — Ambulatory Visit (HOSPITAL_COMMUNITY)
Admission: RE | Admit: 2013-07-07 | Discharge: 2013-07-07 | Disposition: A | Discharge status: Home / Self Care | Payer: Medicare Other | Source: Ambulatory Visit | Attending: Internal Medicine | Admitting: Internal Medicine

## 2013-07-07 ENCOUNTER — Encounter (HOSPITAL_COMMUNITY): Payer: Self-pay

## 2013-07-07 DIAGNOSIS — D704 Cyclic neutropenia: Secondary | ICD-10-CM | POA: Diagnosis not present

## 2013-07-07 DIAGNOSIS — C8589 Other specified types of non-Hodgkin lymphoma, extranodal and solid organ sites: Secondary | ICD-10-CM | POA: Insufficient documentation

## 2013-07-07 DIAGNOSIS — I10 Essential (primary) hypertension: Secondary | ICD-10-CM | POA: Insufficient documentation

## 2013-07-07 DIAGNOSIS — F3289 Other specified depressive episodes: Secondary | ICD-10-CM | POA: Diagnosis not present

## 2013-07-07 DIAGNOSIS — F329 Major depressive disorder, single episode, unspecified: Secondary | ICD-10-CM | POA: Insufficient documentation

## 2013-07-07 DIAGNOSIS — F411 Generalized anxiety disorder: Secondary | ICD-10-CM | POA: Diagnosis not present

## 2013-07-07 DIAGNOSIS — D649 Anemia, unspecified: Secondary | ICD-10-CM | POA: Diagnosis not present

## 2013-07-07 DIAGNOSIS — C8590 Non-Hodgkin lymphoma, unspecified, unspecified site: Secondary | ICD-10-CM

## 2013-07-07 LAB — BONE MARROW EXAM

## 2013-07-07 LAB — APTT: APTT: 35 s (ref 24–37)

## 2013-07-07 LAB — CBC
HCT: 28.9 % — ABNORMAL LOW (ref 39.0–52.0)
HEMOGLOBIN: 9.1 g/dL — AB (ref 13.0–17.0)
MCH: 25.7 pg — AB (ref 26.0–34.0)
MCHC: 31.5 g/dL (ref 30.0–36.0)
MCV: 81.6 fL (ref 78.0–100.0)
PLATELETS: 305 10*3/uL (ref 150–400)
RBC: 3.54 MIL/uL — ABNORMAL LOW (ref 4.22–5.81)
RDW: 16 % — ABNORMAL HIGH (ref 11.5–15.5)
WBC: 7.8 10*3/uL (ref 4.0–10.5)

## 2013-07-07 LAB — PROTIME-INR
INR: 1.22 (ref 0.00–1.49)
PROTHROMBIN TIME: 15.1 s (ref 11.6–15.2)

## 2013-07-07 MED ORDER — FENTANYL CITRATE 0.05 MG/ML IJ SOLN
INTRAMUSCULAR | Status: AC
  Filled 2013-07-07: qty 4

## 2013-07-07 MED ORDER — ACETAMINOPHEN 325 MG PO TABS
650.0000 mg | ORAL_TABLET | Freq: Once | ORAL | Status: AC
  Administered 2013-07-07: 650 mg via ORAL
  Filled 2013-07-07 (×2): qty 2

## 2013-07-07 MED ORDER — MIDAZOLAM HCL 2 MG/2ML IJ SOLN
INTRAMUSCULAR | Status: AC
  Filled 2013-07-07: qty 4

## 2013-07-07 MED ORDER — SODIUM CHLORIDE 0.9 % IV SOLN
INTRAVENOUS | Status: DC
Start: 2013-07-07 — End: 2013-07-08
  Administered 2013-07-07: 08:00:00 via INTRAVENOUS

## 2013-07-07 MED ORDER — FENTANYL CITRATE 0.05 MG/ML IJ SOLN
INTRAMUSCULAR | Status: AC | PRN
  Administered 2013-07-07: 100 ug via INTRAVENOUS

## 2013-07-07 NOTE — Discharge Instructions (Signed)
Conscious Sedation, Adult, Care After °Refer to this sheet in the next few weeks. These instructions provide you with information on caring for yourself after your procedure. Your health care provider may also give you more specific instructions. Your treatment has been planned according to current medical practices, but problems sometimes occur. Call your health care provider if you have any problems or questions after your procedure. °WHAT TO EXPECT AFTER THE PROCEDURE  °After your procedure: °· You may feel sleepy, clumsy, and have poor balance for several hours. °· Vomiting may occur if you eat too soon after the procedure. °HOME CARE INSTRUCTIONS °· Do not participate in any activities where you could become injured for at least 24 hours. Do not: °· Drive. °· Swim. °· Ride a bicycle. °· Operate heavy machinery. °· Cook. °· Use power tools. °· Climb ladders. °· Work from a high place. °· Do not make important decisions or sign legal documents until you are improved. °· If you vomit, drink water, juice, or soup when you can drink without vomiting. Make sure you have little or no nausea before eating solid foods. °· Only take over-the-counter or prescription medicines for pain, discomfort, or fever as directed by your health care provider. °· Make sure you and your family fully understand everything about the medicines given to you, including what side effects may occur. °· You should not drink alcohol, take sleeping pills, or take medicines that cause drowsiness for at least 24 hours. °· If you smoke, do not smoke without supervision. °· If you are feeling better, you may resume normal activities 24 hours after you were sedated. °· Keep all appointments with your health care provider. °SEEK MEDICAL CARE IF: °· Your skin is pale or bluish in color. °· You continue to feel nauseous or vomit. °· Your pain is getting worse and is not helped by medicine. °· You have bleeding or swelling. °· You are still sleepy or  feeling clumsy after 24 hours. °SEEK IMMEDIATE MEDICAL CARE IF: °· You develop a rash. °· You have difficulty breathing. °· You develop any type of allergic problem. °· You have a fever. °MAKE SURE YOU: °· Understand these instructions. °· Will watch your condition. °· Will get help right away if you are not doing well or get worse. °Document Released: 11/03/2012 Document Reviewed: 08/20/2012 °Exit[employer]® Patient Information ©2014 [employer]. °Bone Marrow Aspiration, Bone Marrow Biopsy °Care After °Read the instructions outlined below and refer to this sheet in the next few weeks. These discharge instructions provide you with general information on caring for yourself after you leave the hospital. Your caregiver may also give you specific instructions. While your treatment has been planned according to the most current medical practices available, unavoidable complications occasionally occur. If you have any problems or questions after discharge, call your caregiver. °FINDING OUT THE RESULTS OF YOUR TEST °Not all test results are available during your visit. If your test results are not back during the visit, make an appointment with your caregiver to find out the results. Do not assume everything is normal if you have not heard from your caregiver or the medical facility. It is important for you to follow up on all of your test results.  °HOME CARE INSTRUCTIONS  °You have had sedation and may be sleepy or dizzy. Your thinking may not be as clear as usual. For the next 24 hours: °· Only take over-the-counter or prescription medicines for pain, discomfort, and or fever as directed by your caregiver. °·   by your caregiver.  Do not drink alcohol.  Do not smoke.  Do not drive.  Do not make important legal decisions.  Do not operate heavy machinery.  Do not care for small children by yourself.  Keep your dressing clean and dry. You may replace dressing with a bandage after 24 hours.  You may take a bath or shower after 24  hours.  Use an ice pack for 20 minutes every 2 hours while awake for pain as needed. SEEK MEDICAL CARE IF:   There is redness, swelling, or increasing pain at the biopsy site.  There is pus coming from the biopsy site.  There is drainage from a biopsy site lasting longer than one day.  An unexplained oral temperature above 102 F (38.9 C) develops. SEEK IMMEDIATE MEDICAL CARE IF:   You develop a rash.  You have difficulty breathing.  You develop any reaction or side effects to medications given. Document Released: 08/02/2004 Document Revised: 04/07/2011 Document Reviewed: 01/11/2008 [facility] Patient Information 2014 [place].

## 2013-07-07 NOTE — H&P (Signed)
Douglas Bryant is an 63 y.o. male.   Chief Complaint: "I'm having a bone marrow biopsy" HPI: Patient with history of lymphoma presents today for CT guided bone marrow biopsy.  Past Medical History  Diagnosis Date  . Anxiety   . Depression   . HTN (hypertension)   . Back pain     Past Surgical History  Procedure Laterality Date  . Hernia repair      Family History  Problem Relation Age of Onset  . Alcohol abuse Father   . Depression Sister   . Anxiety disorder Sister   . Depression Sister   . Anxiety disorder Sister    Social History:  reports that he has never smoked. He does not have any smokeless tobacco history on file. He reports that he drinks about 1.8 ounces of alcohol per week. He reports that he uses illicit drugs (Marijuana).  Allergies: No Known Allergies  Current outpatient prescriptions:LORazepam (ATIVAN) 1 MG tablet, Take 1 tablet (1 mg total) by mouth 3 (three) times daily., Disp: 90 tablet, Rfl: 4;  pantoprazole (PROTONIX) 40 MG tablet, Take 40 mg by mouth daily., Disp: , Rfl: ;  valACYclovir (VALTREX) 500 MG tablet, Take 500 mg by mouth daily as needed (Herpes Virus)., Disp: , Rfl:  acetaminophen (TYLENOL) 500 MG tablet, Take 1,000 mg by mouth every 6 (six) hours as needed for mild pain., Disp: , Rfl: ;  chlorpheniramine (CHLOR-TRIMETON) 4 MG tablet, Take 4 mg by mouth 2 (two) times daily as needed for allergies., Disp: , Rfl: ;  HYDROcodone-acetaminophen (NORCO/VICODIN) 5-325 MG per tablet, Take 1 tablet by mouth every 4 (four) hours as needed for moderate pain., Disp: , Rfl:  Current facility-administered medications:0.9 %  sodium chloride infusion, , Intravenous, Continuous, Ascencion Dike, PA-C, Last Rate: 20 mL/hr at 07/07/13 0809   Results for orders placed during the hospital encounter of 07/07/13 (from the past 48 hour(s))  APTT     Status: None   Collection Time    07/07/13  7:59 AM      Result Value Ref Range   aPTT 35  24 - 37 seconds  CBC      Status: Abnormal   Collection Time    07/07/13  7:59 AM      Result Value Ref Range   WBC 7.8  4.0 - 10.5 K/uL   RBC 3.54 (*) 4.22 - 5.81 MIL/uL   Hemoglobin 9.1 (*) 13.0 - 17.0 g/dL   HCT 28.9 (*) 39.0 - 52.0 %   MCV 81.6  78.0 - 100.0 fL   MCH 25.7 (*) 26.0 - 34.0 pg   MCHC 31.5  30.0 - 36.0 g/dL   RDW 16.0 (*) 11.5 - 15.5 %   Platelets 305  150 - 400 K/uL  PROTIME-INR     Status: None   Collection Time    07/07/13  7:59 AM      Result Value Ref Range   Prothrombin Time 15.1  11.6 - 15.2 seconds   INR 1.22  0.00 - 1.49   No results found.  Review of Systems  Constitutional: Positive for fever, chills and malaise/fatigue.  HENT:       Occ HA's  Respiratory: Negative for hemoptysis and shortness of breath.        Occ cough  Cardiovascular: Negative for chest pain.  Gastrointestinal: Positive for abdominal pain. Negative for vomiting and blood in stool.       Occ nausea  Genitourinary: Negative for dysuria and hematuria.  Musculoskeletal: Positive for back pain.  Endo/Heme/Allergies: Does not bruise/bleed easily.  Psychiatric/Behavioral: Positive for depression.    Blood pressure 113/61, pulse 112, temperature 103 F (39.4 C), resp. rate 16, height $RemoveBe'6\' 1"'[id number]$  (1.854 m), weight 169 lb (76.658 kg), SpO2 96.00%. Physical Exam  Constitutional: He is oriented to person, place, and time. He appears well-developed and well-nourished.  Neck:  Left lateral neck incision from recent node dissection clean and dry, no erythema  Cardiovascular:  Tachy but regular  Respiratory: Effort normal.  Few scatt wheezes; clean, intact rt chest wall PAC  GI: Soft. Bowel sounds are normal.  Mild LLQ discomfort  Musculoskeletal: Normal range of motion. He exhibits no edema.  Neurological: He is alert and oriented to person, place, and time.     Assessment/Plan Pt with hx newly diagnosed lymphoma. Plan is for CT guided bone marrow biopsy today. Details/risks of procedure d/w pt with his  understanding and consent. Fever most likely related to lymphoma- will administer tylenol. Case d/w Drs. Darovsky/Shick- plan to proceed with bone marrow biopsy today. Pt will f/u with Dr. [name] on 6/12.  Ranesha Val,D KEVIN 07/07/2013, 8:40 AM

## 2013-07-07 NOTE — Sedation Documentation (Signed)
Pt. Stable. Fluids offered for low BP.

## 2013-07-07 NOTE — Progress Notes (Signed)
Patient ambulated to restroom with RN. Tolerated well. Temperature down to 98.5. Escorted to exit and home with sister.

## 2013-07-07 NOTE — Progress Notes (Signed)
Patient stated he took his Ativan (1mg ) last night and took an extra one. Stated took 2 tabs then he stated he actually took 4 tabs. After questioning patient was unclear exactly how many he took and how spaced out they were. Sister states patient is more groggy than normal this am. Patient also states he was having chills last night with fever. IV started and blood work drawn. Will call IR MD now and inform of Ativan as well as fever this am. Patient currently resting in bed with sister at bedside.

## 2013-07-07 NOTE — Progress Notes (Signed)
Patient's temp is 102.9 rechecked and temp 103. He states he started feeling poorly last night. Had port placed last week. Site unremarkable. Had lymph nodes from left side of neck removed several days ago. Incision unremarkable. Call placed to IR to make aware of temp.

## 2013-07-07 NOTE — Procedures (Signed)
Successful RTILIAC BM ASP AND CORE BX No comp Stable Path pending

## 2013-07-15 LAB — CHROMOSOME ANALYSIS, BONE MARROW

## 2013-07-18 ENCOUNTER — Ambulatory Visit (HOSPITAL_COMMUNITY): Payer: BC Managed Care – PPO | Admitting: Psychiatry

## 2013-07-25 ENCOUNTER — Ambulatory Visit (HOSPITAL_COMMUNITY): Payer: BC Managed Care – PPO

## 2013-08-08 ENCOUNTER — Telehealth (HOSPITAL_COMMUNITY): Payer: Self-pay | Admitting: *Deleted

## 2013-08-08 NOTE — Telephone Encounter (Signed)
Patient states on Prednisone during chemotherapy.Due to Prednisone, has been wound up feeling and was taking extra Lorazepam during first of month.Realized what he was doing and that it did not help as he thought it would, so resumed  3/day as ordered.Because of taking extra Lorazepam, ran out early. If MD will approve, needs to fill earlier tan allowed at [facility]. Reviewed information with Dr. Dwyane Dee (in Dr. Karen Chafe absence). Per Dr.Kumar, early fill of Lorazepam authorized this one time.Per Vaughan Basta at [facility], last fill was 07/15/13 so this is 6 days early. Information given to [place] at [facility] regarding auth to refill early.

## 2013-08-10 NOTE — Telephone Encounter (Signed)
Phoned patient to update: 7/13 @ 1625, 1630, 1643, 1653 - Busy signal Phoned patient to update: 7/14 @ 1610, 1620 - Busy signal

## 2013-08-26 ENCOUNTER — Other Ambulatory Visit: Payer: Self-pay | Admitting: Internal Medicine

## 2013-08-26 DIAGNOSIS — C8590 Non-Hodgkin lymphoma, unspecified, unspecified site: Secondary | ICD-10-CM

## 2013-09-01 ENCOUNTER — Other Ambulatory Visit: Payer: Self-pay | Admitting: Internal Medicine

## 2013-09-01 ENCOUNTER — Ambulatory Visit (HOSPITAL_COMMUNITY)
Admission: RE | Admit: 2013-09-01 | Discharge: 2013-09-01 | Disposition: A | Discharge status: Home / Self Care | Payer: Medicare Other | Source: Ambulatory Visit | Attending: Internal Medicine | Admitting: Internal Medicine

## 2013-09-01 ENCOUNTER — Encounter (HOSPITAL_COMMUNITY): Payer: Self-pay

## 2013-09-01 DIAGNOSIS — R161 Splenomegaly, not elsewhere classified: Secondary | ICD-10-CM | POA: Insufficient documentation

## 2013-09-01 DIAGNOSIS — C8590 Non-Hodgkin lymphoma, unspecified, unspecified site: Secondary | ICD-10-CM

## 2013-09-01 DIAGNOSIS — Z9221 Personal history of antineoplastic chemotherapy: Secondary | ICD-10-CM | POA: Insufficient documentation

## 2013-09-01 DIAGNOSIS — C8589 Other specified types of non-Hodgkin lymphoma, extranodal and solid organ sites: Secondary | ICD-10-CM | POA: Insufficient documentation

## 2013-09-01 DIAGNOSIS — R599 Enlarged lymph nodes, unspecified: Secondary | ICD-10-CM | POA: Insufficient documentation

## 2013-09-01 DIAGNOSIS — D739 Disease of spleen, unspecified: Secondary | ICD-10-CM | POA: Diagnosis not present

## 2013-09-01 LAB — GLUCOSE, CAPILLARY: Glucose-Capillary: 86 mg/dL (ref 70–99)

## 2013-09-01 MED ORDER — FLUDEOXYGLUCOSE F - 18 (FDG) INJECTION
10.1000 | Freq: Once | INTRAVENOUS | Status: AC | PRN
  Administered 2013-09-01: 10.1 via INTRAVENOUS

## 2013-09-21 ENCOUNTER — Encounter (HOSPITAL_COMMUNITY): Payer: Self-pay

## 2013-10-12 ENCOUNTER — Ambulatory Visit (INDEPENDENT_AMBULATORY_CARE_PROVIDER_SITE_OTHER): Payer: Medicare Other | Admitting: Psychiatry

## 2013-10-12 VITALS — BP 100/64 | HR 88 | Ht 73.0 in | Wt 181.4 lb

## 2013-10-12 DIAGNOSIS — F4323 Adjustment disorder with mixed anxiety and depressed mood: Secondary | ICD-10-CM

## 2013-10-12 MED ORDER — ZOLPIDEM TARTRATE 10 MG PO TABS: 10.0000 mg | ORAL_TABLET | Freq: Every evening | ORAL | Status: DC | PRN

## 2013-10-12 MED ORDER — LORAZEPAM 1 MG PO TABS: ORAL_TABLET | ORAL | Status: DC

## 2013-10-12 NOTE — Progress Notes (Signed)
[facility] MD Progress Note  10/12/2013 5:01 PM Douglas Bryant  MRN:  [id number] Subjective: Doing well Today the patient is finishing his chemotherapy for his non-Hodgkin's lymphoma. His mood is amazingly good. His anxiety is controlled taking 3 or 4 mg of Ativan. He is sleeping fairly well he is eating well. He's finished his chemotherapy. The patient is not drinking any alcohol and he remains positive and optimistic. The patient shows no evidence of psychosis. Diagnosis:   DSM5: Schizophrenia Disorders:   Obsessive-Compulsive Disorders:   Trauma-Stressor Disorders:   Substance/Addictive Disorders:   Depressive Disorders:   Total Time spent with patient: 20 minutes  Axis I: Adjustment Disorder with Mixed Emotional Features  ADL's:  Intact  Sleep: Good  Appetite:  Good  Suicidal Ideation:  no Homicidal Ideation:  none AEB (as evidenced by):  Psychiatric Specialty Exam: Physical Exam  ROS  Blood pressure 100/64, pulse 88, height 6\' 1"  (1.854 m), weight 181 lb 6.4 oz (82.283 kg).Body mass index is 23.94 kg/(m^2).  General Appearance: Casual  Eye Contact::  Good  Speech:  Clear and Coherent  Volume:  Normal  Mood:  Euthymic  Affect:  Appropriate  Thought Process:  Coherent  Orientation:  Full (Time, Place, and Person)  Thought Content:  WDL  Suicidal Thoughts:  No  Homicidal Thoughts:  No  Memory:  NA  Judgement:  Good  Insight:  Good  Psychomotor Activity:  Normal  Concentration:  Good  Recall:  Good  Fund of Knowledge:Good  Language: Good  Akathisia:  No  Handed:  Right  AIMS (if indicated):     Assets:  Desire for Improvement  Sleep:      Musculoskeletal: Strength & Muscle Tone:  Gait & Station:  Patient leans:   Current Medications: Current Outpatient Prescriptions  Medication Sig Dispense Refill  . acetaminophen (TYLENOL) 500 MG tablet Take 1,000 mg by mouth every 6 (six) hours as needed for mild pain.      . chlorpheniramine (CHLOR-TRIMETON) 4 MG  tablet Take 4 mg by mouth 2 (two) times daily as needed for allergies.      Marland Kitchen HYDROcodone-acetaminophen (NORCO/VICODIN) 5-325 MG per tablet Take 1 tablet by mouth every 4 (four) hours as needed for moderate pain.      Marland Kitchen LORazepam (ATIVAN) 1 MG tablet 1 tid  1  prn  120 tablet  4  . pantoprazole (PROTONIX) 40 MG tablet Take 40 mg by mouth daily.      . valACYclovir (VALTREX) 500 MG tablet Take 500 mg by mouth daily as needed (Herpes Virus).      . zolpidem (AMBIEN) 10 MG tablet Take 1 tablet (10 mg total) by mouth at bedtime as needed for sleep.  30 tablet  5   No current facility-administered medications for this visit.    Lab Results: No results found for this or any previous visit (from the past 48 hour(s)).  Physical Findings: AIMS:  , ,  ,  ,    CIWA:    COWS:     Treatment Plan Summary: At this time the patient will increase his Ativan to taking 1 mg 3 times a day with 1 extra when necessary. I will describe Ambien 10 mg at night for sleep. This patient to return to see me in 3 months. Patient is really doing quite well.  Plan:  Medical Decision Making Problem Points:  Established problem, stable/improving (1) Data Points:  Review of medication regiment & side effects (2)  I certify that  inpatient services furnished can reasonably be expected to improve the patient's condition.   Kaislyn Gulas, [place] 10/12/2013, 5:01 PM

## 2013-11-10 ENCOUNTER — Other Ambulatory Visit (HOSPITAL_COMMUNITY): Payer: Self-pay | Admitting: Internal Medicine

## 2013-11-10 DIAGNOSIS — C8590 Non-Hodgkin lymphoma, unspecified, unspecified site: Secondary | ICD-10-CM

## 2013-11-28 ENCOUNTER — Encounter (HOSPITAL_COMMUNITY)
Admission: RE | Admit: 2013-11-28 | Discharge: 2013-11-28 | Disposition: A | Discharge status: Home / Self Care | Payer: Medicare Other | Source: Ambulatory Visit | Attending: Body Imaging | Admitting: Body Imaging

## 2013-11-28 ENCOUNTER — Encounter (HOSPITAL_COMMUNITY): Payer: Self-pay

## 2013-11-28 DIAGNOSIS — I517 Cardiomegaly: Secondary | ICD-10-CM | POA: Insufficient documentation

## 2013-11-28 DIAGNOSIS — C8590 Non-Hodgkin lymphoma, unspecified, unspecified site: Secondary | ICD-10-CM

## 2013-11-28 DIAGNOSIS — D7389 Other diseases of spleen: Secondary | ICD-10-CM | POA: Insufficient documentation

## 2013-11-28 DIAGNOSIS — I2510 Atherosclerotic heart disease of native coronary artery without angina pectoris: Secondary | ICD-10-CM | POA: Diagnosis not present

## 2013-11-28 DIAGNOSIS — R918 Other nonspecific abnormal finding of lung field: Secondary | ICD-10-CM | POA: Insufficient documentation

## 2013-11-28 DIAGNOSIS — I7090 Unspecified atherosclerosis: Secondary | ICD-10-CM | POA: Insufficient documentation

## 2013-11-28 MED ORDER — FLUDEOXYGLUCOSE F - 18 (FDG) INJECTION
9.1000 | Freq: Once | INTRAVENOUS | Status: AC | PRN
  Administered 2013-11-28: 9.1 via INTRAVENOUS

## 2013-11-29 DIAGNOSIS — C8590 Non-Hodgkin lymphoma, unspecified, unspecified site: Secondary | ICD-10-CM | POA: Diagnosis not present

## 2013-11-29 LAB — GLUCOSE, CAPILLARY: Glucose-Capillary: 104 mg/dL — ABNORMAL HIGH (ref 70–99)

## 2014-01-11 ENCOUNTER — Ambulatory Visit (INDEPENDENT_AMBULATORY_CARE_PROVIDER_SITE_OTHER): Payer: Medicare Other | Admitting: Psychiatry

## 2014-01-11 VITALS — BP 140/92 | HR 98 | Ht 73.5 in | Wt 186.2 lb

## 2014-01-11 DIAGNOSIS — F4322 Adjustment disorder with anxiety: Secondary | ICD-10-CM

## 2014-01-11 DIAGNOSIS — F4323 Adjustment disorder with mixed anxiety and depressed mood: Secondary | ICD-10-CM

## 2014-01-11 MED ORDER — ZOLPIDEM TARTRATE 10 MG PO TABS: 10.0000 mg | ORAL_TABLET | Freq: Every evening | ORAL | Status: DC | PRN

## 2014-01-11 MED ORDER — LORAZEPAM 1 MG PO TABS: ORAL_TABLET | ORAL | Status: DC

## 2014-01-11 NOTE — Progress Notes (Signed)
[facility] MD Progress Note  01/11/2014 3:56 PM Douglas Bryant  MRN:  [id number] Subjective: Doing great The patient's last PET scan was negative. The patient is cancer free from his lymphoma. Tomorrow is going to visit his girlfriend in [place]. Patient's son is doing better. He is in college. Environment the patient sitting in is stable. The patient denies daily depression. He is sleeping and eating very well. He's got good energy. He is minimal side effects from his chemotherapy. He just finished his chemotherapy in the last one month. The patient plays music plays a guitar even plays with his son. He denies anxiety. The patient doesn't knowledge that he sleeping fairly long. He sleeps 10-11 hours a day which is more than usual. The patient is a good sex drive he enjoys life. The patient has been on Ativan for many many years. He clearly calm and benefits from it. The patient is doing very well. Diagnosis:   DSM5: Schizophrenia Disorders:   Obsessive-Compulsive Disorders:   Trauma-Stressor Disorders:   Substance/Addictive Disorders:   Depressive Disorders:   Total Time spent with patient:   Axis I: Adjustment Disorder with Anxiety  ADL's:  Intact  Sleep: Good  Appetite:  Good  Suicidal Ideation:  no Homicidal Ideation:  none AEB (as evidenced by):  Psychiatric Specialty Exam: Physical Exam  ROS  Blood pressure 140/92, pulse 98, height 6' 1.5" (1.867 m), weight 186 lb 3.2 oz (84.46 kg).Body mass index is 24.23 kg/(m^2).  General Appearance: Casual  Eye Contact::  Good  Speech:  Clear and Coherent  Volume:  Normal  Mood:  Euthymic  Affect:  Appropriate  Thought Process:  Coherent  Orientation:  Full (Time, Place, and Person)  Thought Content:  WDL  Suicidal Thoughts:  No  Homicidal Thoughts:  No  Memory:  NA  Judgement:  NA  Insight:  Good  Psychomotor Activity:  Normal  Concentration:  Good  Recall:  Good  Fund of Knowledge:Good  Language: Good  Akathisia:  No   Handed:  Right  AIMS (if indicated):     Assets:  Desire for Improvement  Sleep:      Musculoskeletal: Strength & Muscle Tone:  Gait & Station:  Patient leans:   Current Medications: Current Outpatient Prescriptions  Medication Sig Dispense Refill  . acetaminophen (TYLENOL) 500 MG tablet Take 1,000 mg by mouth every 6 (six) hours as needed for mild pain.    . chlorpheniramine (CHLOR-TRIMETON) 4 MG tablet Take 4 mg by mouth 2 (two) times daily as needed for allergies.    Marland Kitchen HYDROcodone-acetaminophen (NORCO/VICODIN) 5-325 MG per tablet Take 1 tablet by mouth every 4 (four) hours as needed for moderate pain.    Marland Kitchen LORazepam (ATIVAN) 1 MG tablet 1 tid  1  prn 120 tablet 4  . pantoprazole (PROTONIX) 40 MG tablet Take 40 mg by mouth daily.    . valACYclovir (VALTREX) 500 MG tablet Take 500 mg by mouth daily as needed (Herpes Virus).    . zolpidem (AMBIEN) 10 MG tablet Take 1 tablet (10 mg total) by mouth at bedtime as needed for sleep. 30 tablet 5   No current facility-administered medications for this visit.    Lab Results: No results found for this or any previous visit (from the past 48 hour(s)).  Physical Findings: AIMS:  , ,  ,  ,    CIWA:    COWS:     Treatment Plan Summary: At this time the patient will continue all his medications. The  patient make attempt to just take 3 of his 1 mg Ativan's and take 1 extra only if necessary. He seems to most the time get away with just taking 3. He has difficulty falling off to sleep all. When he does follow-up to sleep he sleeps a long time. At this time I will continue his Ambien. This patient will return to see me in 5 months.  Plan:  Medical Decision Making Problem Points:  Established problem, stable/improving (1) Data Points:  Review of medication regiment & side effects (2)  I certify that inpatient services furnished can reasonably be expected to improve the patient's condition.   Akaila Rambo, [place] 01/11/2014, 3:56 PM

## 2014-03-09 ENCOUNTER — Telehealth (HOSPITAL_COMMUNITY): Payer: Self-pay | Admitting: *Deleted

## 2014-03-09 NOTE — Telephone Encounter (Signed)
Pt called asking if he can be started on Cymbalta for depression. States he has taken it in the past with good results. Has an appointment in May but will make one sooner if he needs to speak with the doctor first.

## 2014-03-24 NOTE — Telephone Encounter (Signed)
Called patient after discussion with Dr. Casimiro Needle about his request to restart Cymbalta he had taken in the past.  Dr. Casimiro Needle reported he would like to evaluate patient first and informed patient he was actually scheduled for 03/29/14 at 4:00pm with Dr. Casimiro Needle.  Patient agreed with plan and will discuss medication adjustments at evaluation that date.  Patient to call if any concerns prior.

## 2014-03-29 ENCOUNTER — Ambulatory Visit (INDEPENDENT_AMBULATORY_CARE_PROVIDER_SITE_OTHER): Payer: Medicare Other | Admitting: Psychiatry

## 2014-03-29 ENCOUNTER — Encounter (HOSPITAL_COMMUNITY): Payer: Self-pay | Admitting: Psychiatry

## 2014-03-29 VITALS — BP 136/82 | HR 68 | Wt 195.0 lb

## 2014-03-29 DIAGNOSIS — F341 Dysthymic disorder: Secondary | ICD-10-CM | POA: Diagnosis not present

## 2014-03-29 MED ORDER — DULOXETINE HCL 30 MG PO CPEP: 30.0000 mg | ORAL_CAPSULE | Freq: Every day | ORAL | Status: DC

## 2014-03-29 NOTE — Progress Notes (Addendum)
[name] Progress Note  03/29/2014 4:31 PM Douglas Bryant  MRN:  [id number] Subjective:  Depressed The patient physically is doing well. He is cancer free. He just had a negative CAT scan. He no longer has anemia. The patient notes that in the last month or 2 he's become persistently depressed much like he is been over the years. For a while it seemed to abate while he was dealing with his cancer but now he experiences depression nearly every day. The patient notes that he sleeping excessively. His energy level is low although it's getting better. The patient is actually eating well has good concentration. 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 He's able to read. The patient can describe a long time history of being depressed since high school. He does admit to symptoms of being hopeless and helpless over the years. Fortunately he's never been suicidal. He's not suicidal now. He denies a feeling of worthlessness but does have a low self-esteem. In review of all his notes with me it is evident this patient likely has a dysthymic disorder early onset. He's been on multiple antidepressants with minimal benefits with exception of one time when he took Cymbalta. The patient remembers taking it and feeling a little bit better but not really getting it a good chance to work. It is noted this patient is never been in a psychiatric hospital. He has upcoming appointment in a month or 2 but could not wait. The patient has been in therapy the past. Right now his psychosocial stressors seem actually mild. He lives with his physically ill sister who has polio who actually is stable. His son is in college and is doing a little bit better. His relationship with his girlfriend is stable. In essence he is survived lymphoma and instead of feeling great continues to feel sad and down. Is been a chronic problem. I think the most likely diagnosis for this gentleman is early onset dysthymic disorder. Consistent with  this is the fact that he's failed many different antidepressants including Wellbutrin and other SSRIs. Principal Problem:Dysthymic Disoder Diagnosis:   Patient Active Problem List   Diagnosis Date Noted  . Cannabis abuse [F12.10] 08/10/2012  . Depressive disorder, not elsewhere classified [F32.9] 08/10/2012  . 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 Adjustment disorder with mixed anxiety and depressed mood [F43.23] 06/04/2012   Total Time spent with patient: 30 minutes   Past Medical History:  Past Medical History  Diagnosis Date  . Anxiety   . Depression   . HTN (hypertension)   . Back pain     Past Surgical History  Procedure Laterality Date  . Hernia repair     Family History:  Family History  Problem Relation Age of Onset  . Alcohol abuse Father   . Depression Sister   . Anxiety disorder Sister   . Depression Sister   . Anxiety disorder Sister    Social History:  History  Alcohol Use  . 1.8 oz/week  . 3 Cans of beer per week     History  Drug Use  . Yes  . Special: Marijuana    Comment: smokes daily for thirty years. currently takes one puff    History   Social History  . Marital Status: Divorced    Spouse Name: N/A  . Number of Children: N/A  . Years of Education: N/A   Social History Main Topics  . Smoking status: Never Smoker   . Smokeless tobacco: Not on  file  . Alcohol Use: 1.8 oz/week    3 Cans of beer per week  . Drug Use: Yes    Special: Marijuana     Comment: smokes daily for thirty years. currently takes one puff  . Sexual Activity: Yes   Other Topics Concern  . None   Social History Narrative   Additional History:    Sleep: Poor  Appetite:  Good   Assessment:   Musculoskeletal: Strength & Muscle Tone:  Gait & Station:  Patient leans:    Psychiatric Specialty Exam: Physical Exam  ROS  Blood pressure 136/82, pulse 68, weight 195 lb (88.451 kg).Body mass index is 25.38 kg/(m^2).  General App good  Eye  Contact::  Good  Speech:  Clear and Coherent  Volume:  Normal  Mood:  Depressed  Affect:  Blunt  Thought Process:  Coherent  Orientation:  Full (Time, Place, and Person)  Thought Content:  WDL  Suicidal Thoughts:  No  Homicidal Thoughts:  No  Memory:  NA  Judgement:  Good  Insight:  Good  Psychomotor Activity:  NA and Normal  Concentration:  Good  Recall:  Good  Fund of Knowledge:Good  Language: Good  Akathisia:  No  Handed:  Right  AIMS (if indicated):     Assets:  Desire for Improvement  ADL's:  Intact  Cognition: WNL  Sleep:        Current Medications: Current Outpatient Prescriptions  Medication Sig Dispense Refill  . acetaminophen (TYLENOL) 500 MG tablet Take 1,000 mg by mouth every 6 (six) hours as needed for mild pain.    . chlorpheniramine (CHLOR-TRIMETON) 4 MG tablet Take 4 mg by mouth 2 (two) times daily as needed for allergies.    Marland Kitchen HYDROcodone-acetaminophen (NORCO/VICODIN) 5-325 MG per tablet Take 1 tablet by mouth every 4 (four) hours as needed for moderate pain.    Marland Kitchen LORazepam (ATIVAN) 1 MG tablet 1 tid  1  prn 120 tablet 4  . pantoprazole (PROTONIX) 40 MG tablet Take 40 mg by mouth daily.    . valACYclovir (VALTREX) 500 MG tablet Take 500 mg by mouth daily as needed (Herpes Virus).    . DULoxetine (CYMBALTA) 30 MG capsule Take 1 capsule (30 mg total) by mouth daily. 30 capsule 4   No current facility-administered medications for this visit.    Lab Results: No results found for this or any previous visit (from the past 48 hour(s)).  Physical Findings: AIMS:  , ,  ,  ,    CIWA:    COWS:     Treatment Plan Summary: Medication management At this time we will go ahead and begin this patient on Cymbalta 30 mg. I am willing to trial this for couple months and perhaps even increase it to 60. The patient continue taking Ativan 1 3 times a day with 1 extra when necessary and continue Ambien at night. This patient is not suicidal. I think at a close examination  it is evident this patient has a chronic dysthymic disorder. This patient she'll be reevaluated in 2 months. This patient may take time to respond.  Medical Decision Making:  New problem, with additional work up planned     Haskel Schroeder 03/29/2014, 4:31 PM

## 2014-04-10 ENCOUNTER — Ambulatory Visit (HOSPITAL_COMMUNITY): Payer: Self-pay | Admitting: Psychology

## 2014-06-14 ENCOUNTER — Ambulatory Visit (HOSPITAL_COMMUNITY): Payer: Self-pay | Admitting: Psychiatry

## 2014-06-15 ENCOUNTER — Ambulatory Visit (INDEPENDENT_AMBULATORY_CARE_PROVIDER_SITE_OTHER): Payer: Medicare Other | Admitting: Psychology

## 2014-06-15 DIAGNOSIS — F341 Dysthymic disorder: Secondary | ICD-10-CM

## 2014-07-05 ENCOUNTER — Encounter (HOSPITAL_COMMUNITY): Payer: Self-pay | Admitting: Psychiatry

## 2014-07-05 ENCOUNTER — Ambulatory Visit (INDEPENDENT_AMBULATORY_CARE_PROVIDER_SITE_OTHER): Payer: Medicare Other | Admitting: Psychiatry

## 2014-07-05 VITALS — BP 130/86 | HR 71 | Ht 73.5 in | Wt 193.0 lb

## 2014-07-05 DIAGNOSIS — F341 Dysthymic disorder: Secondary | ICD-10-CM | POA: Diagnosis not present

## 2014-07-05 MED ORDER — LORAZEPAM 1 MG PO TABS: ORAL_TABLET | ORAL | Status: DC

## 2014-07-05 MED ORDER — ZOLPIDEM TARTRATE 10 MG PO TABS: 10.0000 mg | ORAL_TABLET | Freq: Every evening | ORAL | Status: DC | PRN

## 2014-07-05 MED ORDER — VORTIOXETINE HBR 5 MG PO TABS: 5.0000 mg | ORAL_TABLET | Freq: Every morning | ORAL | Status: DC

## 2014-07-05 NOTE — Progress Notes (Signed)
[facility] MD Progress Note  07/05/2014 4:16 PM [name]  MRN:  [id number] Subjective:  Mood is good Principal Problem: Dysthymic disorder  Diagnosis:  Dysthymic disorder  Today the patient is doing fairly well. He does describe a chronic mild depression which does affect him on a day-to-day basis. He is sleeping excessively. His energy level is low. He does not believe he has much stamina at all. The patient is eating and concentrating fairly well. He is not psychotic he is not drinking any alcohol. The patient remembered now in retrospect that all the SSRI specifically that of Cymbalta actually made him feel like he was chilling and cold. I do suspect it made him sweat after sweating he then would get a chill. He try the Cymbalta and could not tolerate it. Once again the patient has issues related to an girlfriend who lives in another city and he has to deal with his son who just recently got a job. The patient feels a little bit better about a son now now that is working. This patient is not suicidal. He shows no evidence of psychosis. He shows a chronic dysthymic disorder and is failed most anterior presence. I suspect he has one left there were going to try. Patient Active Problem List   Diagnosis Date Noted  . Cannabis abuse [F12.10] 08/10/2012  . Depressive disorder, not elsewhere classified [F32.9] 08/10/2012  . 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 Adjustment disorder with mixed anxiety and depressed mood [F43.23] 06/04/2012   Total Time spent with patient: 30 minutes   Past Medical History:  Past Medical History  Diagnosis Date  . Anxiety   . Depression   . HTN (hypertension)   . Back pain     Past Surgical History  Procedure Laterality Date  . Hernia repair     Family History:  Family History  Problem Relation Age of Onset  . Alcohol abuse Father   . Depression Sister   . Anxiety disorder Sister   . Depression Sister   . Anxiety disorder Sister     Social History:  History  Alcohol Use  . 1.8 oz/week  . 3 Cans of beer per week     History  Drug Use  . Yes  . Special: Marijuana    Comment: smokes daily for thirty years. currently takes one puff    History   Social History  . Marital Status: Divorced    Spouse Name: N/A  . Number of Children: N/A  . Years of Education: N/A   Social History Main Topics  . Smoking status: Never Smoker   . Smokeless tobacco: Not on file  . Alcohol Use: 1.8 oz/week    3 Cans of beer per week  . Drug Use: Yes    Special: Marijuana     Comment: smokes daily for thirty years. currently takes one puff  . Sexual Activity: Yes   Other Topics Concern  . None   Social History Narrative   Additional History:    Sleep: Fair  Appetite:  Good   Assessment:   Musculoskeletal: Strength & Muscle Tone:  Gait & Station:  Patient leans:    Psychiatric Specialty Exam: Physical Exam  ROS  Blood pressure 130/86, pulse 71, height 6' 1.5" (1.867 m), weight 193 lb (87.544 kg).Body mass index is 25.12 kg/(m^2).  General Appearance: Casual  Eye Contact::  Good  Speech:  Normal Rate  Volume:  Normal  Mood:  NA  Affect:  NA  Thought  Process:  Coherent  Orientation:  Negative  Thought Content:  WDL  Suicidal Thoughts:  No  Homicidal Thoughts:  No  Memory:  NA  Judgement:  Good  Insight:  NA  Psychomotor Activity:  Normal  Concentration:  Good  Recall:  Good  Fund of Knowledge:Fair  Language: NA  Akathisia:  No  Handed:  Right  AIMS (if indicated):     Assets:  Desire for Improvement  ADL's:  Intact  Cognition: WNL  Sleep:        Current Medications: Current Outpatient Prescriptions  Medication Sig Dispense Refill  . acetaminophen (TYLENOL) 500 MG tablet Take 1,000 mg by mouth every 6 (six) hours as needed for mild pain.    . chlorpheniramine (CHLOR-TRIMETON) 4 MG tablet Take 4 mg by mouth 2 (two) times daily as needed for allergies.    . DULoxetine (CYMBALTA) 30 MG  capsule Take 1 capsule (30 mg total) by mouth daily. 30 capsule 4  . HYDROcodone-acetaminophen (NORCO/VICODIN) 5-325 MG per tablet Take 1 tablet by mouth every 4 (four) hours as needed for moderate pain.    Marland Kitchen LORazepam (ATIVAN) 1 MG tablet 1 tid  1  prn 120 tablet 4  . pantoprazole (PROTONIX) 40 MG tablet Take 40 mg by mouth daily.    . valACYclovir (VALTREX) 500 MG tablet Take 500 mg by mouth daily as needed (Herpes Virus).    . Vortioxetine HBr (BRINTELLIX) 5 MG TABS Take 1 tablet (5 mg total) by mouth every morning. 30 tablet 3  . zolpidem (AMBIEN) 10 MG tablet Take 1 tablet (10 mg total) by mouth at bedtime as needed for sleep. 30 tablet 4   No current facility-administered medications for this visit.    Lab Results: No results found for this or any previous visit (from the past 48 hour(s)).  Physical Findings: AIMS:  , ,  ,  ,    CIWA:    COWS:     Treatment Plan Summary: At this time we are going to start this patient on Brentilix 5 mg. The patient also is entered in therapy in his community. This patient to return to see me in 2 months    Medical Decision Making:  Self-Limited or Minor (1)     Douglas Bryant IRVING 07/05/2014, 4:16 PM

## 2014-09-27 ENCOUNTER — Ambulatory Visit (INDEPENDENT_AMBULATORY_CARE_PROVIDER_SITE_OTHER): Payer: Medicare Other | Admitting: Psychiatry

## 2014-09-27 VITALS — BP 133/95 | HR 75 | Ht 73.0 in | Wt 196.0 lb

## 2014-09-27 DIAGNOSIS — F341 Dysthymic disorder: Secondary | ICD-10-CM

## 2014-09-27 MED ORDER — VORTIOXETINE HBR 5 MG PO TABS: ORAL_TABLET | ORAL | Status: DC

## 2014-09-27 MED ORDER — LORAZEPAM 1 MG PO TABS: ORAL_TABLET | ORAL | Status: DC

## 2014-09-27 MED ORDER — ZOLPIDEM TARTRATE 10 MG PO TABS: 10.0000 mg | ORAL_TABLET | Freq: Every evening | ORAL | Status: DC | PRN

## 2014-09-27 NOTE — Progress Notes (Signed)
[facility] MD Progress Note  09/27/2014 2:40 PM Douglas Bryant  MRN:  [id number] Subjective: Better Principal Problem depression  Diagnosis:   dysthymic disorder  Today the patient says that his mood is better. Which most important is the fact that he is tolerated the new antidepressive. Usually he cannot tolerate SSRIs but is taken trineelix 5 mg low dose and done well. He says his mood actually has improved. His energy level is better. He'll he sleeps well and is eating okay. He still enjoying things. His son got a new job. The patient still enjoys music and singing his girlfriend in [place]. The patient denies chest pain or shortness of breath. He is in good spirits. The patient has no neurological symptoms. The new antidepressive is not making him have chills and not making him hyper. He says his blood pressure is mildly elevated but I do not believe it's due to this antidepressive. The patient is actually pleased that he is responding and tolerating a low dose of the medicine.  Patient Active Problem List   Diagnosis Date Noted  . Cannabis abuse [F12.10] 08/10/2012  . Depressive disorder, not elsewhere classified [F32.9] 08/10/2012  . 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 Adjustment disorder with mixed anxiety and depressed mood [F43.23] 06/04/2012   Total Time spent with patient: 30 minutes   Past Medical History:  Past Medical History  Diagnosis Date  . Anxiety   . Depression   . HTN (hypertension)   . Back pain     Past Surgical History  Procedure Laterality Date  . Hernia repair     Family History:  Family History  Problem Relation Age of Onset  . Alcohol abuse Father   . Depression Sister   . Anxiety disorder Sister   . Depression Sister   . Anxiety disorder Sister    Social History:  History  Alcohol Use  . 1.8 oz/week  . 3 Cans of beer per week     History  Drug Use  . Yes  . Special: Marijuana    Comment: smokes daily for thirty years. currently  takes one puff    Social History   Social History  . Marital Status: Divorced    Spouse Name: N/A  . Number of Children: N/A  . Years of Education: N/A   Social History Main Topics  . Smoking status: Never Smoker   . Smokeless tobacco: Not on file  . Alcohol Use: 1.8 oz/week    3 Cans of beer per week  . Drug Use: Yes    Special: Marijuana     Comment: smokes daily for thirty years. currently takes one puff  . Sexual Activity: Yes   Other Topics Concern  . Not on file   Social History Narrative   Additional History:    Sleep: Good  Appetite:  Good   Assessment:   Musculoskeletal: Strength & Muscle Tone: within normal limits Gait & Station: normal Patient leans: Right   Psychiatric Specialty Exam: Physical Exam  ROS  Blood pressure 133/95, pulse 75, height 6\' 1"  (1.854 m), weight 196 lb (88.905 kg).Body mass index is 25.86 kg/(m^2).  General Appearance: Casual  Eye Contact::  Good  Speech:  Clear and Coherent  Volume:  Normal  Mood:  Euthymic  Affect:  Appropriate  Thought Process:  Coherent  Orientation:  Full (Time, Place, and Person)  Thought Content:  WDL  Suicidal Thoughts:  No  Homicidal Thoughts:  No  Memory:  NA  Judgement:  NA  Insight:  Good  Psychomotor Activity:  NA  Concentration:  Good  Recall:  Good  Fund of Knowledge:Good  Language: Fair  Akathisia:  No  Handed:  Right  AIMS (if indicated):     Assets:  Financial Resources/Insurance  ADL's:  Intact  Cognition: WNL  Sleep:        Current Medications: Current Outpatient Prescriptions  Medication Sig Dispense Refill  . acetaminophen (TYLENOL) 500 MG tablet Take 1,000 mg by mouth every 6 (six) hours as needed for mild pain.    . chlorpheniramine (CHLOR-TRIMETON) 4 MG tablet Take 4 mg by mouth 2 (two) times daily as needed for allergies.    . DULoxetine (CYMBALTA) 30 MG capsule Take 1 capsule (30 mg total) by mouth daily. 30 capsule 4  . HYDROcodone-acetaminophen (NORCO/VICODIN)  5-325 MG per tablet Take 1 tablet by mouth every 4 (four) hours as needed for moderate pain.    Marland Kitchen LORazepam (ATIVAN) 1 MG tablet 1 tid  1  prn 120 tablet 4  . pantoprazole (PROTONIX) 40 MG tablet Take 40 mg by mouth daily.    . valACYclovir (VALTREX) 500 MG tablet Take 500 mg by mouth daily as needed (Herpes Virus).    . Vortioxetine HBr (BRINTELLIX) 5 MG TABS 1  qam 30 tablet 3  . zolpidem (AMBIEN) 10 MG tablet Take 1 tablet (10 mg total) by mouth at bedtime as needed for sleep. 30 tablet 4   No current facility-administered medications for this visit.    Lab Results: No results found for this or any previous visit (from the past 48 hour(s)).  Physical Findings: AIMS:  , ,  ,  ,    CIWA:    COWS:     Treatment Plan Summary: At this time we'll go ahead and increase his  Trntelix to a dose of 10 mg. This will be the highest dose will go for the time being. The patient continue taking Ativan for day and Ambien. The possibility beginning to reduce his Ativan she'll be re-looked at again. This patient is #1 problem is that of a depressed mood state that is chronic and mild. His mood is better and as a result his energy level seems to be better as well. He's taking the 80 to present as prescribed.    Medical Decision Making:  New problem, with additional work up planned     Haskel Schroeder 09/27/2014, 2:40 PM

## 2015-01-17 ENCOUNTER — Ambulatory Visit (HOSPITAL_COMMUNITY): Payer: Self-pay | Admitting: Psychiatry

## 2015-02-05 NOTE — Progress Notes (Signed)
Patient:  Douglas Bryant   DOB: September 30, 1950  MR Number: [id number]  Location: [facility] [address] [place] [address] Dept: [phone]  Start: 11 AM End: 12 PM  Provider/Observer:     Edgardo Roys PSYD  Chief Complaint:      Chief Complaint  Patient presents with  . Depression    Reason For Service:     The patient was referred by the con behavior helped clinic in [facility] for issues of depression. He described significant stressors have it with financial difficulties, his relationships, and his living situation. He describes excessive worrying, low energy, anxiety and depression. The patient reports he rightly worries about money and where he is going to live. He reports that the symptoms of been going on for the past four years. The patient reports that he has experienced depression for a long time and was treated by psychiatry in [place] and continues to be followed there. He was seen for counseling with Serafina Mitchell in the past. He reported two years ago things change. The patient had a son with his second wife who lived in [place]. As a teenager the sun was kicked out of the home and the patient was living in [place]. The patient for city took over and the patient eventually moved back to meet and to live here and keep the sun. The patient portion got very unhappy living here in [place] became depressed about his living situation. He reports it now he is stuck living here and taking care of his sister who is disabled from polio. His son is now 57 years old and is going to [employer]. 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  Interventions Strategy:  Cognitive/behavioral psychotherapeutic intervention  Participation Level:   Active  Participation Quality:  Appropriate      Behavioral Observation:  Well  Groomed, Alert, and Appropriate.   Current Psychosocial Factors: The patient reports he is very stressed about his current living situation and continues to be frustrated about the overall situation. He is happy that his son is doing better in school.  Content of Session:   Review current symptoms and worked on therapeutic interventions around issues of dysthymic disorder and depression.  Current Status:   Patient reports cities continue to be quite frustrated about his overall living situation.  Patient Progress:   stable  Target Goals:   Target goals include building better coping skills and strategies around the room with his current situation is financial difficulties particularly his medical debts.  Last Reviewed:   06/15/2014  Goals Addressed Today:    Goals addressed today had to do with building better coping skills and strategies in following up on the work that was being done in [place] at the behavioral health clinic there.  Impression/Diagnosis:   The patient has a history of increasing symptoms of depression and stress as well as dysthymic types of symptoms. Low energy, worrying about money, and his current living situation are all playing a major role.  Diagnosis:    Axis I: Dysthymic disorder

## 2015-02-08 ENCOUNTER — Ambulatory Visit (INDEPENDENT_AMBULATORY_CARE_PROVIDER_SITE_OTHER): Payer: Medicare Other | Admitting: Psychology

## 2015-02-08 DIAGNOSIS — F341 Dysthymic disorder: Secondary | ICD-10-CM | POA: Diagnosis not present

## 2015-02-08 DIAGNOSIS — F329 Major depressive disorder, single episode, unspecified: Secondary | ICD-10-CM | POA: Diagnosis not present

## 2015-02-16 DIAGNOSIS — L720 Epidermal cyst: Secondary | ICD-10-CM | POA: Diagnosis not present

## 2015-02-16 DIAGNOSIS — L989 Disorder of the skin and subcutaneous tissue, unspecified: Secondary | ICD-10-CM | POA: Diagnosis not present

## 2015-02-16 DIAGNOSIS — D485 Neoplasm of uncertain behavior of skin: Secondary | ICD-10-CM | POA: Diagnosis not present

## 2015-02-16 DIAGNOSIS — D225 Melanocytic nevi of trunk: Secondary | ICD-10-CM | POA: Diagnosis not present

## 2015-02-22 DIAGNOSIS — Z95828 Presence of other vascular implants and grafts: Secondary | ICD-10-CM | POA: Diagnosis not present

## 2015-02-28 ENCOUNTER — Ambulatory Visit (INDEPENDENT_AMBULATORY_CARE_PROVIDER_SITE_OTHER): Payer: Medicare Other | Admitting: Psychology

## 2015-02-28 DIAGNOSIS — F329 Major depressive disorder, single episode, unspecified: Secondary | ICD-10-CM | POA: Diagnosis not present

## 2015-03-20 ENCOUNTER — Ambulatory Visit (INDEPENDENT_AMBULATORY_CARE_PROVIDER_SITE_OTHER): Payer: Medicare Other | Admitting: Psychology

## 2015-03-20 DIAGNOSIS — F329 Major depressive disorder, single episode, unspecified: Secondary | ICD-10-CM

## 2015-03-21 ENCOUNTER — Ambulatory Visit (INDEPENDENT_AMBULATORY_CARE_PROVIDER_SITE_OTHER): Payer: Medicare Other | Admitting: Psychiatry

## 2015-03-21 VITALS — BP 122/83 | HR 78 | Ht 73.0 in | Wt 195.0 lb

## 2015-03-21 DIAGNOSIS — F341 Dysthymic disorder: Secondary | ICD-10-CM | POA: Diagnosis not present

## 2015-03-21 DIAGNOSIS — F1210 Cannabis abuse, uncomplicated: Secondary | ICD-10-CM

## 2015-03-21 MED ORDER — BUSPIRONE HCL 10 MG PO TABS: ORAL_TABLET | ORAL | Status: DC

## 2015-03-21 MED ORDER — LORAZEPAM 1 MG PO TABS: ORAL_TABLET | ORAL | Status: DC

## 2015-03-21 NOTE — Progress Notes (Signed)
[facility] MD Progress Note  03/21/2015 4:05 PM Douglas Bryant  MRN:  [id number] Subjective:  Mild depression Principal Problem: Dysthymic disorder Diagnosis: Dysthymic disorder; cannabis abuse In contrast to his last visit he now claims that the new antidepressant, Trintelix was causing side effects. He said he would making feel strange and sweaty. He said it would make him feel nervous and anxious. On his last visit he was taking 5 mg and he claimed it was helping him. We asked him to increase it to 10 mg a day for a while but he was erratic in its use. The patient seems to be inconsistent historian. He says the Ativan helps him a lot and reduce his anxiety yet he says he is always nervous and always depressed. Is not suicidal. He sleeps fairly normally waking up now and then to urinate. He is eating well. He says his energy level is reduced. He smokes marijuana every day but a small amount. He uses no other drugs he does drink a little more than initially describe drinking about 3 or 4 drinks every other day. His son is moved out and is working. The patient seems like he's not having a good relationship with his girlfriend in [place]. Patient's major complaint is that he can't seem to get anything done. He completely knowledge is that marijuana might affect his motivation but he says when he gets high for a little while he can do some things. The patient told me that he took his sons stimulant Focalin and it helped him. The patient requesting to be on this agent. The patient describes himself as a straight a student all the way until about fifth or sixth grade when he had problems with OCD symptoms. The patient eventually did get to college but he was not consistent. It is noted the patient presently is in therapy seeing his therapist at least once a month. He likes the therapist. The patient is still able to play guitar and still enjoys TV. He denies being hopeless or helpless. He demonstrates no evidence of  psychosis. He is not suicidal. The patient is difficult to assess. My initial wish was to decrease his Ativan and today he is agreed to reduce it from 4 day down to 3 a day. He says sometimes he only takes it 3 a day since should be that the change. I think this patient being a straight a student up until fifth grade likely did not have attention deficit disorder. We will reevaluate the issue of OCD. Today we talked about other antidepressants but he's tried many of them and doesn't think really make a difference.   Patient Active Problem List   Diagnosis Date Noted  . Cannabis abuse [F12.10] 08/10/2012  . Depressive disorder, not elsewhere classified [F32.9] 08/10/2012  . Obsessive-compulsive disorders [F42.9] 08/10/2012  . Adjustment disorder with mixed anxiety and depressed mood [F43.23] 06/04/2012   Total Time spent with patient: 30 minutes  Past Psychiatric History:   Past Medical History:  Past Medical History  Diagnosis Date  . Anxiety   . Depression   . HTN (hypertension)   . Back pain     Past Surgical History  Procedure Laterality Date  . Hernia repair     Family History:  Family History  Problem Relation Age of Onset  . Alcohol abuse Father   . Depression Sister   . Anxiety disorder Sister   . Depression Sister   . Anxiety disorder Sister    Family Psychiatric  History:  Social History:  History  Alcohol Use  . 1.8 oz/week  . 3 Cans of beer per week     History  Drug Use  . Yes  . Special: Marijuana    Comment: smokes daily for thirty years. currently takes one puff    Social History   Social History  . Marital Status: Divorced    Spouse Name: N/A  . Number of Children: N/A  . Years of Education: N/A   Social History Main Topics  . Smoking status: Never Smoker   . Smokeless tobacco: Not on file  . Alcohol Use: 1.8 oz/week    3 Cans of beer per week  . Drug Use: Yes    Special: Marijuana     Comment: smokes daily for thirty years. currently takes  one puff  . Sexual Activity: Yes   Other Topics Concern  . Not on file   Social History Narrative   Additional Social History:                         Sleep: Fair  Appetite:  Good  Current Medications: Current Outpatient Prescriptions  Medication Sig Dispense Refill  . acetaminophen (TYLENOL) 500 MG tablet Take 1,000 mg by mouth every 6 (six) hours as needed for mild pain.    . busPIRone (BUSPAR) 10 MG tablet 1  Bid withfood 60 tablet 3  . chlorpheniramine (CHLOR-TRIMETON) 4 MG tablet Take 4 mg by mouth 2 (two) times daily as needed for allergies.    . DULoxetine (CYMBALTA) 30 MG capsule Take 1 capsule (30 mg total) by mouth daily. 30 capsule 4  . HYDROcodone-acetaminophen (NORCO/VICODIN) 5-325 MG per tablet Take 1 tablet by mouth every 4 (four) hours as needed for moderate pain.    Marland Kitchen LORazepam (ATIVAN) 1 MG tablet 1 tid 90 tablet 3  . pantoprazole (PROTONIX) 40 MG tablet Take 40 mg by mouth daily.    . valACYclovir (VALTREX) 500 MG tablet Take 500 mg by mouth daily as needed (Herpes Virus).    . Vortioxetine HBr (BRINTELLIX) 5 MG TABS 1  qam 30 tablet 3   No current facility-administered medications for this visit.    Lab Results: No results found for this or any previous visit (from the past 48 hour(s)).  Blood Alcohol level:  No results found for: [facility]  Physical Findings: AIMS:  , ,  ,  ,    CIWA:    COWS:     Musculoskeletal: Strength & Muscle Tone: within normal limits Gait & Station: normal Patient leans: N/A  Psychiatric Specialty Exam: ROS  Blood pressure 122/83, pulse 78, height 6\' 1"  (1.854 m), weight 195 lb (88.451 kg).Body mass index is 25.73 kg/(m^2).  General Appearance: Casual  Eye Contact::  Good  Speech:  Clear and Coherent  Volume:  Normal  Mood:  Euthymic  Affect:  Congruent  Thought Process:  Coherent  Orientation:  Full (Time, Place, and Person)  Thought Content:  WDL  Suicidal Thoughts:  No  Homicidal Thoughts:  No  Memory:   NA  Judgement:  Good  Insight:  Fair  Psychomotor Activity:  Normal  Concentration:  Good  Recall:  Good  Fund of Knowledge:Good  Language: Fair  Akathisia:  No  Handed:  Right  AIMS (if indicated):     Assets:  Desire for Improvement  ADL's:  Intact  Cognition: WNL  Sleep:      Treatment Plan Summary: At this time  this patient is difficult to assess. He's had chemotherapy and I suspected had a cognitive affect. He actually thinks the same thing. I think the use of stimulants may not be appropriate in this individual in that he is not focused on any particular outcome and he smoking marijuana. Is also drinking some. The patient says that he is reading implicating that he is concentrating to some degree. I was not believe the stimulant should be used for motivation. Motivation should be a psychological issue and hopefully he'll discuss this with his therapist. Today is agreed to stop his Ambien which she rarely takes to take no antidepressant and to reduce his Ativan to 1 mg 3 a day. I shared with him that I disagree with the use of marijuana if in fact he has a cognitive problem from his chemotherapy. I think is likely making it worse. I think this is self-destructive behavior addiction behavior and I think the use of a stimulant in the setting is inappropriate. We will keep discussing this topic when he returns in 3 months. The patient acknowledges that because he smokes marijuana he knows he can apply for a job because they'll drug screen yet the patient does not seem to be be willing to stop smoking marijuana at least for a month. I'm glad the patient is in therapy yet he is yet to describe to his therapist is lack of motivation issue. Again I think there is no specific task that this patient has to do that he is not being able to do because of concentration issues. I think he can repeat I think he complained guitar. He agreed that the one topic that we could focus on is his anxiety. Therefore  we'll go ahead and start him on BuSpar 10 mg twice a day in conjunction with his Ativan. A scenario could be that he comes off Ativan and uses only BuSpar. He'll return in 3 months and we'll discuss it.  Haskel Schroeder, MD 03/21/2015, 4:05 PM

## 2015-04-18 DIAGNOSIS — D801 Nonfamilial hypogammaglobulinemia: Secondary | ICD-10-CM | POA: Diagnosis not present

## 2015-04-18 DIAGNOSIS — C8338 Diffuse large B-cell lymphoma, lymph nodes of multiple sites: Secondary | ICD-10-CM | POA: Diagnosis not present

## 2015-04-18 DIAGNOSIS — Z95828 Presence of other vascular implants and grafts: Secondary | ICD-10-CM | POA: Diagnosis not present

## 2015-04-18 DIAGNOSIS — C8380 Other non-follicular lymphoma, unspecified site: Secondary | ICD-10-CM | POA: Diagnosis not present

## 2015-04-18 DIAGNOSIS — R740 Nonspecific elevation of levels of transaminase and lactic acid dehydrogenase [LDH]: Secondary | ICD-10-CM | POA: Diagnosis not present

## 2015-04-18 DIAGNOSIS — Z9221 Personal history of antineoplastic chemotherapy: Secondary | ICD-10-CM | POA: Diagnosis not present

## 2015-05-07 ENCOUNTER — Telehealth (HOSPITAL_COMMUNITY): Payer: Self-pay

## 2015-05-07 DIAGNOSIS — F341 Dysthymic disorder: Secondary | ICD-10-CM

## 2015-05-07 NOTE — Telephone Encounter (Signed)
Patient calling in, he states that at his last appointment on 2/22, that the Ambien had been discontinued. Patient now feels like he really needs it and would like to know if you can refill it again. Please review and advise, thank you

## 2015-05-09 MED ORDER — DOXEPIN HCL 25 MG PO CAPS: 25.0000 mg | ORAL_CAPSULE | Freq: Every evening | ORAL | Status: DC | PRN

## 2015-05-09 NOTE — Telephone Encounter (Signed)
Per Dr. Casimiro Needle, no refill on Ambien. He ordered Doxepin 25 mg 1 po qhs, this order was sent to the pharmacy and I called patient and left a voicemail for him

## 2015-05-15 ENCOUNTER — Encounter (HOSPITAL_COMMUNITY): Payer: Self-pay | Admitting: Psychology

## 2015-05-15 NOTE — Progress Notes (Signed)
Patient:  Douglas Bryant   DOB: 11/27/1950  MR Number: [id number]  Location: [facility] [address] [place] [address] Dept: [phone]  Start: 11 AM End: 12 PM  Provider/Observer:     Edgardo Roys PSYD  Chief Complaint:      Chief Complaint  Patient presents with  . Depression  . Stress    Reason For Service:     The patient was referred by the [facility] behavior helped clinic in [facility] for issues of depression. He described significant stressors have it with financial difficulties, his relationships, and his living situation. He describes excessive worrying, low energy, anxiety and depression. The patient reports he rightly worries about money and where he is going to live. He reports that the symptoms of been going on for the past four years. The patient reports that he has experienced depression for a long time and was treated by psychiatry in [place] and continues to be followed there. He was seen for counseling with Serafina Mitchell in the past. He reported two years ago things change. The patient had a son with his second wife who lived in [place]. As a teenager the sun was kicked out of the home and the patient was living in [place]. The patient for city took over and the patient eventually moved back to [place] and to live here and keep the sun. The patient portion got very unhappy living here in [place] became depressed about his living situation. He reports it now he is stuck living here and taking care of his sister who is disabled from polio. His son is now 30 years old and is going to [employer]. 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  Interventions Strategy:  Cognitive/behavioral psychotherapeutic intervention  Participation Level:   Active  Participation Quality:  Appropriate      Behavioral  Observation:  Well Groomed, Alert, and Appropriate.   Current Psychosocial Factors: The patient returns after not being seen for some time. The patient reports that he is continued to be quite stressed with his financial situation but we continue to work on coping skills. The patient reports that relationships have stabilized and he is actively work on some of the coping skills we have developed .  Content of Session:   Review current symptoms and worked on therapeutic interventions around issues of dysthymic disorder and depression.  Current Status:   Patient reports cities continue to be quite frustrated about his overall living situation.  Patient Progress:   stable  Target Goals:   Target goals include building better coping skills and strategies around the room with his current situation is financial difficulties particularly his medical debts.  Last Reviewed:   02/09/2015  Goals Addressed Today:    Goals addressed today had to do with building better coping skills and strategies in following up on the work that was being done in [place] at the behavioral health clinic there.  Impression/Diagnosis:   The patient has a history of increasing symptoms of depression and stress as well as dysthymic types of symptoms. Low energy, worrying about money, and his current living situation are all playing a major role.  Diagnosis:    Axis I: Dysthymic disorder  Major depressive disorder, single episode, unspecified ([place])

## 2015-05-21 ENCOUNTER — Encounter (HOSPITAL_COMMUNITY): Payer: Self-pay | Admitting: Psychology

## 2015-05-21 NOTE — Progress Notes (Signed)
Patient:  Douglas Bryant   DOB: 1950/09/10  MR Number: [id number]  Location: [facility] [address] [place] [address] Dept: [phone]  Start: 3 PM End: 4 PM  Provider/Observer:     Edgardo Roys PSYD  Chief Complaint:      Chief Complaint  Patient presents with  . Depression    Reason For Service:     The patient was referred by the [facility] behavior helped clinic in [facility] for issues of depression. He described significant stressors have it with financial difficulties, his relationships, and his living situation. He describes excessive worrying, low energy, anxiety and depression. The patient reports he rightly worries about money and where he is going to live. He reports that the symptoms of been going on for the past four years. The patient reports that he has experienced depression for a long time and was treated by psychiatry in [place] and continues to be followed there. He was seen for counseling with Serafina Mitchell in the past. He reported two years ago things change. The patient had a son with his second wife who lived in [place]. As a teenager the sun was kicked out of the home and the patient was living in [place]. The patient for city took over and the patient eventually moved back to [place] and to live here and keep the sun. The patient portion got very unhappy living here in [place] became depressed about his living situation. He reports it now he is stuck living here and taking care of his sister who is disabled from polio. His son is now 67 years old and is going to [employer]. 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  Interventions Strategy:  Cognitive/behavioral psychotherapeutic intervention  Participation Level:   Active  Participation Quality:  Appropriate      Behavioral Observation:  Well  Groomed, Alert, and Appropriate.   Current Psychosocial Factors: The patient returns reporting that he is still struggling with financial issues. However, he reports that he has been actively working on furthering the development of his coping skills and strategies.  Content of Session:   Review current symptoms and worked on therapeutic interventions around issues of dysthymic disorder and depression.  Current Status:   Patient reports that he has been working on figuring out his long-term plans for where he is can live and what can happen with his relationship with his girlfriend. He has been doing better with how to deal with his son.  Patient Progress:   stable  Target Goals:   Target goals include building better coping skills and strategies around the room with his current situation is financial difficulties particularly his medical debts.  Last Reviewed:   02/28/2015  Goals Addressed Today:    Goals addressed today had to do with building better coping skills and strategies in following up on the work that was being done in [place] at the behavioral health clinic there.  Impression/Diagnosis:   The patient has a history of increasing symptoms of depression and stress as well as dysthymic types of symptoms. Low energy, worrying about money, and his current living situation are all playing a major role.  Diagnosis:    Axis I: Major depressive disorder, single episode, unspecified ([place])

## 2015-05-22 DIAGNOSIS — N400 Enlarged prostate without lower urinary tract symptoms: Secondary | ICD-10-CM | POA: Diagnosis not present

## 2015-05-22 DIAGNOSIS — R350 Frequency of micturition: Secondary | ICD-10-CM | POA: Diagnosis not present

## 2015-05-22 DIAGNOSIS — Z789 Other specified health status: Secondary | ICD-10-CM | POA: Diagnosis not present

## 2015-05-22 DIAGNOSIS — Z6826 Body mass index (BMI) 26.0-26.9, adult: Secondary | ICD-10-CM | POA: Diagnosis not present

## 2015-05-22 DIAGNOSIS — M461 Sacroiliitis, not elsewhere classified: Secondary | ICD-10-CM | POA: Diagnosis not present

## 2015-05-22 DIAGNOSIS — Z299 Encounter for prophylactic measures, unspecified: Secondary | ICD-10-CM | POA: Diagnosis not present

## 2015-05-22 DIAGNOSIS — C8590 Non-Hodgkin lymphoma, unspecified, unspecified site: Secondary | ICD-10-CM | POA: Diagnosis not present

## 2015-06-06 DIAGNOSIS — Z95828 Presence of other vascular implants and grafts: Secondary | ICD-10-CM | POA: Diagnosis not present

## 2015-06-21 ENCOUNTER — Ambulatory Visit (INDEPENDENT_AMBULATORY_CARE_PROVIDER_SITE_OTHER): Payer: Medicare Other | Admitting: Psychiatry

## 2015-06-21 VITALS — BP 140/88 | HR 70 | Ht 73.0 in | Wt 196.0 lb

## 2015-06-21 DIAGNOSIS — F341 Dysthymic disorder: Secondary | ICD-10-CM

## 2015-06-21 MED ORDER — LORAZEPAM 1 MG PO TABS: ORAL_TABLET | ORAL | Status: DC

## 2015-06-21 NOTE — Progress Notes (Signed)
[name], male   DOB: 15-Mar-1950, 65 y.o.   MRN: [id number] [facility] MD Progress Note  06/21/2015 3:28 PM Douglas Bryant  MRN:  [id number] Subjective:  Mild depression Principal Problem: Dysthymic disorder Diagnosis: Dysthymic disorder; cannabis abuse At this time this patient is at his baseline. He did actually start knowing his neighbor's lawn and his own lawn. The patient's son continues to do well having a part-time job Theme park manager living on his own. The patient's sister is stable but is dependent upon crutches. It is evident the patient is blind to take care of her. Patient says he wakes up every morning and feels depressed looking at his sister and her two dogs. The patient was girlfriend visits every month or he goes to visit her neurologist. They continue to have a relationship. The patient however is glad he doesn't live with her. He likes living alone. On his last visit the patient was begun on some BuSpar which he took only for a week or so but didn't like it. He said he didn't like the way deal. The patient did not bring up his wish for stimulants. The context that this patient is that he fact drinks 3 drinks every night and can't describe why he does that. He says it really doesn't make him feel that well. The patient is high for marijuana all the time. He does enjoy playing his guitar and does have some friends that he plays with. The patient in general is sleeping fairly well but has to get up to urinate. His eating is stable his energy level is fair. Patient's central complaint is a lack of motivation. This is likely a combination of multiple factors including the fact that he's recipient of chemotherapy, smokes marijuana and drinks every day. Is not clear that he is persistently depressed. He says he feels depressed every day but he still able to function and he has not anhedonic. Her last effort was to begin him on BuSpar with his Ativan never to eventually reduce  and taper his Ativan. At this time is able to describe that Ativan has been helpful and he has been on it for over 20 years. He says that he removes his obsessive thinking and worrying about things. Generally the patient is not suicidal not psychotic and he is functioning. Patient now is beginning to try to work more. Medically he is stable. 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 He denies chest pain or shortness of breath. He denies any neurological symptoms at this time. Total Time spent with patient: 30 minutes  Past Psychiatric History:   Past Medical History:  Past Medical History  Diagnosis Date  . Anxiety   . Depression   . HTN (hypertension)   . Back pain     Past Surgical History  Procedure Laterality Date  . Hernia repair     Family History:  Family History  Problem Relation Age of Onset  . Alcohol abuse Father   . Depression Sister   . Anxiety disorder Sister   . Depression Sister   . Anxiety disorder Sister    Family Psychiatric  History:  Social History:  History  Alcohol Use  . 1.8 oz/week  . 3 Cans of beer per week     History  Drug Use  . Yes  . Special: Marijuana    Comment: smokes daily for thirty years. currently takes one puff    Social History   Social History  .  Marital Status: Divorced    Spouse Name: N/A  . Number of Children: N/A  . Years of Education: N/A   Social History Main Topics  . Smoking status: Never Smoker   . Smokeless tobacco: Not on file  . Alcohol Use: 1.8 oz/week    3 Cans of beer per week  . Drug Use: Yes    Special: Marijuana     Comment: smokes daily for thirty years. currently takes one puff  . Sexual Activity: Yes   Other Topics Concern  . Not on file   Social History Narrative   Additional Social History:                         Sleep: Fair  Appetite:  Good  Current Medications: Current Outpatient Prescriptions  Medication Sig Dispense Refill  . acetaminophen (TYLENOL) 500 MG  tablet Take 1,000 mg by mouth every 6 (six) hours as needed for mild pain.    . busPIRone (BUSPAR) 10 MG tablet 1  Bid withfood 60 tablet 3  . chlorpheniramine (CHLOR-TRIMETON) 4 MG tablet Take 4 mg by mouth 2 (two) times daily as needed for allergies.    [place] Kitchen HYDROcodone-acetaminophen (NORCO/VICODIN) 5-325 MG per tablet Take 1 tablet by mouth every 4 (four) hours as needed for moderate pain.    [place] Kitchen LORazepam (ATIVAN) 1 MG tablet 1 tid 90 tablet 4  . pantoprazole (PROTONIX) 40 MG tablet Take 40 mg by mouth daily.    . valACYclovir (VALTREX) 500 MG tablet Take 500 mg by mouth daily as needed (Herpes Virus).    . Vortioxetine HBr (BRINTELLIX) 5 MG TABS 1  qam 30 tablet 3   No current facility-administered medications for this visit.    Lab Results: No results found for this or any previous visit (from the past 48 hour(s)).  Blood Alcohol level:  No results found for: [facility]  Physical Findings: AIMS:  , ,  ,  ,    CIWA:    COWS:     Musculoskeletal: Strength & Muscle Tone: within normal limits Gait & Station: normal Patient leans: N/A  Psychiatric Specialty Exam: ROS  Blood pressure 140/88, pulse 70, height 6\' 1"  (1.854 m), weight 196 lb (88.905 kg).Body mass index is 25.86 kg/(m^2).  General Appearance: Casual  Eye Contact::  Good  Speech:  Clear and Coherent  Volume:  Normal  Mood:  Euthymic  Affect:  Congruent  Thought Process:  Coherent  Orientation:  Full (Time, Place, and Person)  Thought Content:  WDL  Suicidal Thoughts:  No  Homicidal Thoughts:  No  Memory:  NA  Judgement:  Good  Insight:  Fair  Psychomotor Activity:  Normal  Concentration:  Good  Recall:  Good  Fund of Knowledge:Good  Language: Fair  Akathisia:  No  Handed:  Right  AIMS (if indicated):     Assets:  Desire for Improvement  ADL's:  Intact  Cognition: WNL  Sleep:      Treatment Plan Summary: At this time the patient will officially discontinue any efforts to use BuSpar. He restates that all the  antidepressants he's tried have been intolerable. His sleep problem is chronic but mainly related to the fact that he has to get up to urinate. The patient has made his own decisions about the place to live. Overall he now to live with his sister than anywhere else. At this time I think is reasonable just to continue his Ativan that is been on  for over 2 decades. He takes 1 mg 3 times a day. We will continue this and asked him to return to see me in 4 months. The patient is medically stable. Is not suicidal. He denies the use of illicit illegal drugs other than marijuana.  Haskel Schroeder, MD 06/21/2015, 3:28 PM

## 2015-06-22 DIAGNOSIS — Z299 Encounter for prophylactic measures, unspecified: Secondary | ICD-10-CM | POA: Diagnosis not present

## 2015-06-22 DIAGNOSIS — R5383 Other fatigue: Secondary | ICD-10-CM | POA: Diagnosis not present

## 2015-06-22 DIAGNOSIS — Z0000 Encounter for general adult medical examination without abnormal findings: Secondary | ICD-10-CM | POA: Diagnosis not present

## 2015-06-22 DIAGNOSIS — Z6826 Body mass index (BMI) 26.0-26.9, adult: Secondary | ICD-10-CM | POA: Diagnosis not present

## 2015-06-22 DIAGNOSIS — E7800 Pure hypercholesterolemia, unspecified: Secondary | ICD-10-CM | POA: Diagnosis not present

## 2015-06-22 DIAGNOSIS — Z125 Encounter for screening for malignant neoplasm of prostate: Secondary | ICD-10-CM | POA: Diagnosis not present

## 2015-06-22 DIAGNOSIS — Z7189 Other specified counseling: Secondary | ICD-10-CM | POA: Diagnosis not present

## 2015-06-22 DIAGNOSIS — Z79899 Other long term (current) drug therapy: Secondary | ICD-10-CM | POA: Diagnosis not present

## 2015-06-22 DIAGNOSIS — Z1389 Encounter for screening for other disorder: Secondary | ICD-10-CM | POA: Diagnosis not present

## 2015-06-22 DIAGNOSIS — Z1211 Encounter for screening for malignant neoplasm of colon: Secondary | ICD-10-CM | POA: Diagnosis not present

## 2015-06-27 ENCOUNTER — Ambulatory Visit (HOSPITAL_COMMUNITY): Payer: Medicare Other | Admitting: Psychology

## 2015-07-04 ENCOUNTER — Encounter (HOSPITAL_COMMUNITY): Payer: Self-pay | Admitting: Psychology

## 2015-07-04 NOTE — Progress Notes (Signed)
Patient:  Douglas Bryant   DOB: 09-13-1950  MR Number: [id number]  Location: [facility] [address] [place] [address] Dept: [phone]  Start: 3 PM End: 4 PM  Provider/Observer:     Edgardo Roys PSYD  Chief Complaint:      Chief Complaint  Patient presents with  . Depression    Reason For Service:     The patient was referred by the [facility] behavior helped clinic in [facility] for issues of depression. He described significant stressors have it with financial difficulties, his relationships, and his living situation. He describes excessive worrying, low energy, anxiety and depression. The patient reports he rightly worries about money and where he is going to live. He reports that the symptoms of been going on for the past four years. The patient reports that he has experienced depression for a long time and was treated by psychiatry in [place] and continues to be followed there. He was seen for counseling with Serafina Mitchell in the past. He reported two years ago things change. The patient had a son with his second wife who lived in [place]. As a teenager the sun was kicked out of the home and the patient was living in [place]. The patient for city took over and the patient eventually moved back to [place] and to live here and keep the sun. The patient portion got very unhappy living here in [place] became depressed about his living situation. He reports it now he is stuck living here and taking care of his sister who is disabled from polio. His son is now 7 years old and is going to [employer]. 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  Interventions Strategy:  Cognitive/behavioral psychotherapeutic intervention  Participation Level:   Active  Participation Quality:  Appropriate      Behavioral Observation:  Well  Groomed, Alert, and Appropriate.   Current Psychosocial Factors: The patient returns reporting that he is still struggling with financial issues. However, he reports that he has been actively working on furthering the development of his coping skills and strategies.  Content of Session:   Review current symptoms and worked on therapeutic interventions around issues of dysthymic disorder and depression.  Current Status:   Patient reports that he has been working on figuring out his long-term plans for where he is can live and what can happen with his relationship with his girlfriend. He has been doing better with how to deal with his son.  Patient Progress:   stable  Target Goals:   Target goals include building better coping skills and strategies around the room with his current situation is financial difficulties particularly his medical debts.  Last Reviewed:   03/20/2015  Goals Addressed Today:    Goals addressed today had to do with building better coping skills and strategies in following up on the work that was being done in [place] at the behavioral health clinic there.  Impression/Diagnosis:   The patient has a history of increasing symptoms of depression and stress as well as dysthymic types of symptoms. Low energy, worrying about money, and his current living situation are all playing a major role.  Diagnosis:    Axis I: Major depressive disorder, single episode, unspecified ([place])

## 2015-07-12 DIAGNOSIS — E039 Hypothyroidism, unspecified: Secondary | ICD-10-CM | POA: Diagnosis not present

## 2015-07-12 DIAGNOSIS — E7800 Pure hypercholesterolemia, unspecified: Secondary | ICD-10-CM | POA: Diagnosis not present

## 2015-07-17 ENCOUNTER — Other Ambulatory Visit (HOSPITAL_COMMUNITY): Payer: Self-pay | Admitting: Oncology

## 2015-07-17 DIAGNOSIS — C8330 Diffuse large B-cell lymphoma, unspecified site: Secondary | ICD-10-CM

## 2015-07-17 DIAGNOSIS — D801 Nonfamilial hypogammaglobulinemia: Secondary | ICD-10-CM | POA: Diagnosis not present

## 2015-07-17 DIAGNOSIS — Z95828 Presence of other vascular implants and grafts: Secondary | ICD-10-CM | POA: Diagnosis not present

## 2015-07-17 DIAGNOSIS — C8380 Other non-follicular lymphoma, unspecified site: Secondary | ICD-10-CM | POA: Diagnosis not present

## 2015-07-17 HISTORY — DX: Diffuse large B-cell lymphoma, unspecified site: C83.30

## 2015-08-20 ENCOUNTER — Ambulatory Visit (HOSPITAL_COMMUNITY): Payer: Self-pay | Admitting: Psychology

## 2015-08-22 ENCOUNTER — Ambulatory Visit (INDEPENDENT_AMBULATORY_CARE_PROVIDER_SITE_OTHER): Payer: Medicare Other | Admitting: Psychology

## 2015-08-22 DIAGNOSIS — F341 Dysthymic disorder: Secondary | ICD-10-CM | POA: Diagnosis not present

## 2015-08-23 DIAGNOSIS — E039 Hypothyroidism, unspecified: Secondary | ICD-10-CM | POA: Diagnosis not present

## 2015-08-23 DIAGNOSIS — C8590 Non-Hodgkin lymphoma, unspecified, unspecified site: Secondary | ICD-10-CM | POA: Diagnosis not present

## 2015-08-23 DIAGNOSIS — Z299 Encounter for prophylactic measures, unspecified: Secondary | ICD-10-CM | POA: Diagnosis not present

## 2015-08-23 DIAGNOSIS — E7800 Pure hypercholesterolemia, unspecified: Secondary | ICD-10-CM | POA: Diagnosis not present

## 2015-09-07 ENCOUNTER — Encounter (HOSPITAL_COMMUNITY): Payer: Self-pay

## 2015-09-10 ENCOUNTER — Encounter (HOSPITAL_COMMUNITY): Payer: Medicare Other | Attending: Oncology

## 2015-09-10 VITALS — BP 120/78 | HR 65 | Temp 97.8°F | Resp 18

## 2015-09-10 DIAGNOSIS — C8330 Diffuse large B-cell lymphoma, unspecified site: Secondary | ICD-10-CM | POA: Diagnosis not present

## 2015-09-10 DIAGNOSIS — Z95828 Presence of other vascular implants and grafts: Secondary | ICD-10-CM

## 2015-09-10 DIAGNOSIS — Z452 Encounter for adjustment and management of vascular access device: Secondary | ICD-10-CM

## 2015-09-10 MED ORDER — HEPARIN SOD (PORK) LOCK FLUSH 100 UNIT/ML IV SOLN
500.0000 [IU] | Freq: Once | INTRAVENOUS | Status: AC
  Administered 2015-09-10: 500 [IU] via INTRAVENOUS

## 2015-09-10 MED ORDER — HEPARIN SOD (PORK) LOCK FLUSH 100 UNIT/ML IV SOLN
INTRAVENOUS | Status: AC
  Filled 2015-09-10: qty 5

## 2015-09-10 MED ORDER — SODIUM CHLORIDE 0.9% FLUSH
10.0000 mL | INTRAVENOUS | Status: DC | PRN
  Administered 2015-09-10: 10 mL via INTRAVENOUS
  Filled 2015-09-10: qty 10

## 2015-09-10 NOTE — Patient Instructions (Signed)
[place] at [facility] Discharge Instructions  RECOMMENDATIONS MADE BY THE CONSULTANT AND ANY TEST RESULTS WILL BE SENT TO YOUR REFERRING PHYSICIAN.  Port flushed today. Follow-up as scheduled. Call clinic for any questions or concerns  Thank you for choosing [place] at [facility] to provide your oncology and hematology care.  To afford each patient quality time with our provider, please arrive at least 15 minutes before your scheduled appointment time.   Beginning January 23rd 2017 lab work for the [facility] will be done in the  Main lab at [facility] on 1st floor. If you have a lab appointment with the [facility] please come in thru the  Main Entrance and check in at the main information desk  You need to re-schedule your appointment should you arrive 10 or more minutes late.  We strive to give you quality time with our providers, and arriving late affects you and other patients whose appointments are after yours.  Also, if you no show three or more times for appointments you may be dismissed from the clinic at the providers discretion.     Again, thank you for choosing [facility].  Our hope is that these requests will decrease the amount of time that you wait before being seen by our physicians.       _____________________________________________________________  Should you have questions after your visit to [facility], please contact our office at [phone] between the hours of 8:30 a.m. and 4:30 p.m.  Voicemails left after 4:30 p.m. will not be returned until the following business day.  For prescription refill requests, have your pharmacy contact our office.         Resources For Cancer Patients and their Caregivers ? American Cancer Society: Can assist with transportation, wigs, general needs, runs Look Good Feel Better.        701-576-8532 ? Cancer Care: Provides financial assistance,  online support groups, medication/co-pay assistance.  1-800-813-HOPE (551)743-9502) ? [place] Assists [place] cancer patients and their families through emotional , educational and financial support.  709-587-9950 ? [place] DSS Where to apply for food stamps, Medicaid and utility assistance. 785-550-7414 ? [facility]: Transportation to medical appointments. (415)660-3166 ? Social Security Administration: May apply for disability if have a Stage IV cancer. 217-413-6257 (337) 519-3838 ? [employer], Disability and Transit Services: Assists with nutrition, care and transit needs. [place] Support Programs: @10RELATIVEDAYS @ > Cancer Support Group  2nd Tuesday of the month 1pm-2pm, Journey Room  > Creative Journey  3rd Tuesday of the month 1130am-1pm, Journey Room  > Look Good Feel Better  1st Wednesday of the month 10am-12 noon, Journey Room (Call [place] to register [phone])

## 2015-09-10 NOTE — Progress Notes (Signed)
Lynann Beaver III presented for Portacath access and flush.  Proper placement of portacath confirmed by CXR.  Portacath located right chest wall accessed with  H 20 needle.  Good blood return present. Portacath flushed with 43ml NS and 500U/81ml Heparin and needle removed intact.  Procedure tolerated well and without incident.

## 2015-09-20 ENCOUNTER — Ambulatory Visit (HOSPITAL_COMMUNITY): Payer: Medicare Other | Admitting: Psychology

## 2015-09-21 DIAGNOSIS — N400 Enlarged prostate without lower urinary tract symptoms: Secondary | ICD-10-CM | POA: Diagnosis not present

## 2015-09-21 DIAGNOSIS — E7800 Pure hypercholesterolemia, unspecified: Secondary | ICD-10-CM | POA: Diagnosis not present

## 2015-09-21 DIAGNOSIS — E039 Hypothyroidism, unspecified: Secondary | ICD-10-CM | POA: Diagnosis not present

## 2015-10-03 ENCOUNTER — Telehealth (HOSPITAL_COMMUNITY): Payer: Self-pay | Admitting: *Deleted

## 2015-10-03 NOTE — Telephone Encounter (Signed)
patient canceled appointment on 10/24/15, he said he will call back to reschedule.

## 2015-10-17 ENCOUNTER — Ambulatory Visit (HOSPITAL_COMMUNITY)
Admission: RE | Admit: 2015-10-17 | Discharge: 2015-10-17 | Disposition: A | Discharge status: Home / Self Care | Payer: Medicare Other | Source: Ambulatory Visit | Attending: Oncology | Admitting: Oncology

## 2015-10-17 DIAGNOSIS — C8330 Diffuse large B-cell lymphoma, unspecified site: Secondary | ICD-10-CM | POA: Insufficient documentation

## 2015-10-17 DIAGNOSIS — J984 Other disorders of lung: Secondary | ICD-10-CM | POA: Insufficient documentation

## 2015-10-17 DIAGNOSIS — I700 Atherosclerosis of aorta: Secondary | ICD-10-CM | POA: Insufficient documentation

## 2015-10-17 DIAGNOSIS — D739 Disease of spleen, unspecified: Secondary | ICD-10-CM | POA: Diagnosis not present

## 2015-10-17 DIAGNOSIS — I2510 Atherosclerotic heart disease of native coronary artery without angina pectoris: Secondary | ICD-10-CM | POA: Diagnosis not present

## 2015-10-17 LAB — POCT I-STAT CREATININE: Creatinine, Ser: 0.9 mg/dL (ref 0.61–1.24)

## 2015-10-17 MED ORDER — IOPAMIDOL (ISOVUE-300) INJECTION 61%
100.0000 mL | Freq: Once | INTRAVENOUS | Status: AC | PRN
  Administered 2015-10-17: 100 mL via INTRAVENOUS

## 2015-10-17 NOTE — Progress Notes (Signed)
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 [name] at [facility]    DLBCL (diffuse large B cell lymphoma) ([place])   07/07/2013 Initial Biopsy    CT guided right iliac bone marrow aspiration and core biopsy.      07/07/2013 Pathology Results    Bone Marrow Flow Cytometry - PREDOMINANCE OF T-LYMPHOCYTES WITH NONSPECIFIC REVERSAL OF THE CD4:CD8 RATIO. - NO MONOCLONAL B-CELL POPULATION IDENTIFIED. Bone Marrow, Aspirate,Biopsy, and Clot, right iliac - SLIGHTLY HYPERCELLULAR BONE MARROW FOR AGE WITH TRILINEAGE HEMATOPOIESIS. - NEGATIVE FOR LYMPHOMA - SEE COMMENT. PERIPHERAL BLOOD: - NORMOCYTIC-NORMOCHROMIC ANEMIA. - NEUTROPHILIC LEFT SHIFT.      09/01/2013 PET scan    Minimal metabolic activity associated with the splenic lesion. Otherwise complete response to chemotherapy. No residual hypermetabolic lymph nodes in the skullbase to thigh PET scan. Marked reduction in  lingular consolidation      07/17/2015 Initial Diagnosis    DLBCL (diffuse large B cell lymphoma) ([place])     10/17/2015 Imaging    CT CAP- Stable exam. No evidence for lymphadenopathy in the chest, abdomen, or pelvis. No new or progressive interval findings. 2. 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        HISTORY OF PRESENTING ILLNESS:  Douglas Bryant 65 y.o. male is here to establish ongoing care for of a history of DLBCL, stage IV. He was previously followed at [facility] by Dr. [name].  Douglas Bryant is a pleasant 65 y.o. man who presents with non-Hodgkin's lymphoma, diffuse large B-cell type, clinical stage IV who finished 6 cycles of chemotherapy RCHOP in October 2015. PET CT scan in 2016 was entirely negative. He was last seen by Dr. [name] on 04/18/2015. At that visit,  elevated LDH was noting to be slowly rising.   His PCP is Dr. Brigitte Pulse. Patient notes that his TSH has been running high. He has been started on synthroid. Last TSH was WNL.  Denies any B symptoms. No drenching night sweats. Good appetite.  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 Stumbling at times, has not fallen.  Overall feels well without major complaints.    MEDICAL HISTORY:  Past Medical History:  Diagnosis Date  . Anxiety   . Back pain   . Depression   . HTN (hypertension)     SURGICAL HISTORY: Past Surgical History:  Procedure Laterality Date  . HERNIA REPAIR      SOCIAL HISTORY: Social History   Social History  . Marital status: Divorced    Spouse name: N/A  . Number of children: N/A  . Years of education: N/A   Occupational History  . Not on file.   Social History Main Topics  . Smoking status: Never Smoker  . Smokeless tobacco: Former Systems developer    Types: Chew    Quit date: 1997  . Alcohol use 3.6 oz/week    6 Cans of beer per week  . Drug use:     Frequency: 2.0 times per week    Types: Marijuana     Comment: smokes daily for thirty years. currently takes one puff  . Sexual activity: Yes    Partners: Female    Birth control/ protection: None   Other Topics Concern  . Not on file   Social History Narrative  . No narrative on file  Born  in [place]. Used to live in [place]. Moved here to help sister, she had polio as child. Divorced. 2 children. No grandchildren. 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 Nonsmoker, no tobacco products. Parents smoked. Occasional marijuana.   FAMILY HISTORY: Family History  Problem Relation Age of Onset  . Alcohol abuse Father   . Depression Sister   . Anxiety disorder Sister   . Depression Sister   . Anxiety disorder Sister   Both parents are deceased. Mother was 34, CHF, COPD. Father was 4, MI. 3 younger sisters. Healthy.  ALLERGIES:  is allergic to flomax [tamsulosin hcl].  MEDICATIONS:  Current  Outpatient Prescriptions  Medication Sig Dispense Refill  . acetaminophen (TYLENOL) 500 MG tablet Take 1,000 mg by mouth every 6 (six) hours as needed for mild pain.    . busPIRone (BUSPAR) 10 MG tablet 1  Bid withfood (Patient not taking: Reported on 09/10/2015) 60 tablet 3  . chlorpheniramine (CHLOR-TRIMETON) 4 MG tablet Take 4 mg by mouth 2 (two) times daily as needed for allergies.    Marland Kitchen HYDROcodone-acetaminophen (NORCO/VICODIN) 5-325 MG per tablet Take 1 tablet by mouth every 4 (four) hours as needed for moderate pain.    Marland Kitchen levothyroxine (SYNTHROID) 25 MCG tablet Take 25 mcg by mouth daily before breakfast.    . LORazepam (ATIVAN) 1 MG tablet 1  bid 60 tablet 4  . pantoprazole (PROTONIX) 40 MG tablet Take 40 mg by mouth daily.    . valACYclovir (VALTREX) 500 MG tablet Take 500 mg by mouth daily as needed (Herpes Virus).    . Vortioxetine HBr (BRINTELLIX) 5 MG TABS 1  qam (Patient not taking: Reported on 09/10/2015) 30 tablet 3   No current facility-administered medications for this visit.     Review of Systems  Constitutional: Negative for chills, fever, malaise/fatigue and weight loss.  HENT: Negative for congestion, hearing loss, nosebleeds, sore throat and tinnitus.   Eyes: Negative for blurred vision, double vision, pain and discharge.       Wears bifocals  Respiratory: Negative for cough, hemoptysis, sputum production, shortness of breath and wheezing.   Cardiovascular: Negative for chest pain, palpitations, claudication, leg swelling and PND.  Gastrointestinal: Positive for constipation. Negative for abdominal pain, blood in stool, diarrhea, heartburn, melena, nausea and vomiting.       Colonoscopy 2011 in [place]  Genitourinary: Positive for frequency. Negative for dysuria, hematuria and urgency.       Some symptoms of enlarged prostate at times frequency, nocturia  Musculoskeletal: Positive for back pain. Negative for falls, joint pain and myalgias.  Skin: Negative for itching  and rash.  Neurological: Positive for tingling. Negative for dizziness, tremors, sensory change, speech change, focal weakness, seizures, loss of consciousness, weakness and headaches.       Neuropathy in feet from chemotherapy, has stumbled at times. No falls  Endo/Heme/Allergies: Does not bruise/bleed easily.  Psychiatric/Behavioral: Negative for depression, memory loss, substance abuse and suicidal ideas. The patient is not nervous/anxious and does not have insomnia.    14 point ROS was done and is otherwise as detailed above or in HPI   PHYSICAL EXAMINATION: ECOG PERFORMANCE STATUS: 1 - Symptomatic but completely ambulatory   Vitals - 1 value per visit [id number]  SYSTOLIC [id number]  DIASTOLIC 56  Pulse 79  Temperature 98  Respirations 18  Weight (lb) 197  Height   BMI 25.99  VISIT REPORT     Physical Exam  Constitutional: He is oriented to person, place, and time  and well-developed, well-nourished, and in no distress.  HENT:  Head: Normocephalic and atraumatic.  Nose: Nose normal.  Mouth/Throat: Oropharynx is clear and moist. No oropharyngeal exudate.  Eyes: Conjunctivae and EOM are normal. Pupils are equal, round, and reactive to light. Right eye exhibits no discharge. Left eye exhibits no discharge. No scleral icterus.  Neck: Normal range of motion. Neck supple. No tracheal deviation present. No thyromegaly present.  Cardiovascular: Normal rate, regular rhythm and normal heart sounds.  Exam reveals no gallop and no friction rub.   No murmur heard. Pulmonary/Chest: Effort normal and breath sounds normal. He has no wheezes. He has no rales.  Abdominal: Soft. Bowel sounds are normal. He exhibits no distension and no mass. There is no tenderness. There is no rebound and no guarding.  Musculoskeletal: Normal range of motion. He exhibits no edema.  Lymphadenopathy:    He has no cervical adenopathy.  Neurological: He is alert and oriented to person, place, and time. He has normal  reflexes. No cranial nerve deficit. Gait normal. Coordination normal.  Skin: Skin is warm and dry. No rash noted.  Psychiatric: Mood, memory, affect and judgment normal.  Nursing note and vitals reviewed.   LABORATORY DATA:  I have reviewed the data as listed Lab Results  Component Value Date   WBC 5.3 10/18/2015   HGB 13.8 10/18/2015   HCT 39.2 10/18/2015   MCV 94.7 10/18/2015   PLT 172 10/18/2015   CMP     Component Value Date/Time   NA 135 10/18/2015 1430   K 3.7 10/18/2015 1430   CL 103 10/18/2015 1430   CO2 25 10/18/2015 1430   GLUCOSE 99 10/18/2015 1430   BUN 16 10/18/2015 1430   CREATININE 0.96 10/18/2015 1430   CALCIUM 9.0 10/18/2015 1430   PROT 6.8 10/18/2015 1430   ALBUMIN 4.5 10/18/2015 1430   AST 32 10/18/2015 1430   ALT 26 10/18/2015 1430   ALKPHOS 30 (L) 10/18/2015 1430   BILITOT 1.1 10/18/2015 1430   GFRNONAA >60 10/18/2015 1430   GFRAA >60 10/18/2015 1430         RADIOGRAPHIC STUDIES: I have personally reviewed the radiological images as listed and agreed with the findings in the report. No results found. CLINICAL DATA:  Restaging diffuse large B-cell lymphoma.  EXAM: CT CHEST, ABDOMEN, AND PELVIS WITH CONTRAST  TECHNIQUE: Multidetector CT imaging of the chest, abdomen and pelvis was performed following the standard protocol during bolus administration of intravenous contrast.  CONTRAST:  196mL ISOVUE-300 IOPAMIDOL (ISOVUE-300) INJECTION 61%  COMPARISON:  03/23/2014.  PET-CT 11/28/2013.  FINDINGS: CT CHEST FINDINGS  Cardiovascular: Heart size is normal. Coronary artery calcification is noted. Ascending thoracic aorta measures up to 4.3 cm in maximum diameter. Right Port-A-Cath tip is positioned at the mid SVC level.  Mediastinum/Nodes: No mediastinal lymphadenopathy. There is no hilar lymphadenopathy. The esophagus has normal imaging features. There is no axillary lymphadenopathy.  Lungs/Pleura: Stable appearance of  lingular scarring. Platelike scarring posterior right lower lobe is also stable. No focal airspace consolidation. No pulmonary nodule or mass. No evidence for pulmonary edema or pleural effusion.  Musculoskeletal: Bone windows reveal no worrisome lytic or sclerotic osseous lesions.  CT ABDOMEN PELVIS FINDINGS  Hepatobiliary: No focal abnormality within the liver parenchyma. There is no evidence for gallstones, gallbladder wall thickening, or pericholecystic fluid. No intrahepatic or extrahepatic biliary dilation.  Pancreas: No focal mass lesion. No dilatation of the main duct. No intraparenchymal cyst. No peripancreatic edema.  Spleen: Hypo attenuating lesions seen  in the anterior spleen on the prior study are less conspicuous today.  Adrenals/Urinary Tract: No adrenal nodule or mass. Kidneys are unremarkable. Tiny cortical lesions left kidney are too small to characterize but likely represent cysts. No evidence for hydroureter. The urinary bladder appears normal for the degree of distention.  Stomach/Bowel: Stomach is nondistended. No gastric wall thickening. No evidence of outlet obstruction. Duodenum is normally positioned as is the ligament of Treitz. No small bowel wall thickening. No small bowel dilatation. The terminal ileum is normal. The appendix is normal. No gross colonic mass. No colonic wall thickening. No substantial diverticular change.  Vascular/Lymphatic: There is abdominal aortic atherosclerosis without aneurysm. There is no gastrohepatic or hepatoduodenal ligament lymphadenopathy. No intraperitoneal or retroperitoneal lymphadenopathy. No pelvic sidewall lymphadenopathy.  Reproductive: The prostate gland and seminal vesicles have normal imaging features.  Other: No intraperitoneal free fluid.  Musculoskeletal: 2 cm lucent lesion right iliac crest unchanged since 03/23/2014. Bone windows reveal no worrisome lytic or sclerotic osseous  lesions.  IMPRESSION: 1. Stable exam. No evidence for lymphadenopathy in the chest, abdomen, or pelvis. No new or progressive interval findings. 2. 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 Recommend annual imaging followup by CTA or MRA. This recommendation follows 2010 ACCF/AHA/AATS/ACR/ASA/SCA/SCAI/SIR/STS/SVM Guidelines for the Diagnosis and Management of Patients with Thoracic Aortic Disease. Circulation. 2010; 121: [id number]. 3. Coronary artery atherosclerosis. 4. Stable scarring lingula and right lower lobe. 5. Previously seen hypo attenuating lesion anterior spleen is less conspicuous on today's study.   Electronically Signed   By: Misty Stanley M.D.   On: 10/17/2015 16:14  ASSESSMENT & PLAN:  DLBCL, stage IV Depression Chemotherapy induced neuropathy Port a cath Ascending thoracic aortic aneurysm  We reviewed his CT imaging in detail. He is currently doing fairly well without evidence of recurrence. Will continue with follow-ups in accordance with NCCN guidelines detailed below for stage IV DLBCL.  Flu Vaccination today.   Last appointment with Dr. [name] on 04/18/15. Port flushes will be arranged every 6 weeks.   CT scans show an ascending thoracic aortic aneurysm, he will be referred to CT surgery.   RTC in 3 months with labs, PE.   NCCN guidelines recommends the follow surveillance for Stage I, II for those who asertain a complete response in the first-line treatment setting (3.2017):  A. H&P every 3-6 months for 5 years, then yearly or as clinically indicated.  B. Labs every 3-6 months for 5 years and then annually or as clinically indicated.  C. Imaging only as indicated. For those ascertaining a complete response in the Stage Bryant, IV setting in first-line treatment setting:  A. H&P every 3-6 months for 5 years, then yearly or as clinically indicated.  B. Labs every 3-6 months for 5 years and then annually or as clinically  indicated.  C. CT C/A/P with contrast no more than every 6 months for 2 years after  completion of treatment; then only as indicated.   MEDICATIONS PRESCRIBED THIS ENCOUNTER: Meds ordered this encounter  Medications  . Influenza vac split quadrivalent PF (FLUARIX) injection 0.5 mL   All questions were answered. The patient knows to call the clinic with any problems, questions or concerns.  This document serves as a record of services personally performed by Ancil Linsey, MD. It was created on her behalf by Arlyce Harman, a trained medical scribe. The creation of this record is based on the scribe's personal observations and the provider's statements to them. This document has been checked  and approved by the attending provider.  I have reviewed the above documentation for accuracy and completeness and I agree with the above.  This note was electronically signed.    Molli Hazard, MD   10/27/2015 12:22 AM

## 2015-10-18 ENCOUNTER — Encounter (HOSPITAL_COMMUNITY): Payer: Medicare Other | Attending: Oncology | Admitting: Hematology & Oncology

## 2015-10-18 ENCOUNTER — Encounter (HOSPITAL_BASED_OUTPATIENT_CLINIC_OR_DEPARTMENT_OTHER): Payer: Medicare Other

## 2015-10-18 VITALS — BP 114/56 | HR 79 | Temp 98.0°F | Resp 18 | Wt 197.0 lb

## 2015-10-18 DIAGNOSIS — F1290 Cannabis use, unspecified, uncomplicated: Secondary | ICD-10-CM | POA: Diagnosis not present

## 2015-10-18 DIAGNOSIS — Z23 Encounter for immunization: Secondary | ICD-10-CM | POA: Diagnosis present

## 2015-10-18 DIAGNOSIS — I7121 Aneurysm of the ascending aorta, without rupture: Secondary | ICD-10-CM

## 2015-10-18 DIAGNOSIS — F329 Major depressive disorder, single episode, unspecified: Secondary | ICD-10-CM | POA: Diagnosis not present

## 2015-10-18 DIAGNOSIS — Z95828 Presence of other vascular implants and grafts: Secondary | ICD-10-CM | POA: Insufficient documentation

## 2015-10-18 DIAGNOSIS — G620 Drug-induced polyneuropathy: Secondary | ICD-10-CM

## 2015-10-18 DIAGNOSIS — C8330 Diffuse large B-cell lymphoma, unspecified site: Secondary | ICD-10-CM | POA: Diagnosis not present

## 2015-10-18 DIAGNOSIS — T451X5A Adverse effect of antineoplastic and immunosuppressive drugs, initial encounter: Secondary | ICD-10-CM

## 2015-10-18 DIAGNOSIS — I712 Thoracic aortic aneurysm, without rupture: Secondary | ICD-10-CM

## 2015-10-18 DIAGNOSIS — Z0000 Encounter for general adult medical examination without abnormal findings: Secondary | ICD-10-CM | POA: Diagnosis not present

## 2015-10-18 LAB — COMPREHENSIVE METABOLIC PANEL
ALT: 26 U/L (ref 17–63)
ANION GAP: 7 (ref 5–15)
AST: 32 U/L (ref 15–41)
Albumin: 4.5 g/dL (ref 3.5–5.0)
Alkaline Phosphatase: 30 U/L — ABNORMAL LOW (ref 38–126)
BUN: 16 mg/dL (ref 6–20)
CHLORIDE: 103 mmol/L (ref 101–111)
CO2: 25 mmol/L (ref 22–32)
Calcium: 9 mg/dL (ref 8.9–10.3)
Creatinine, Ser: 0.96 mg/dL (ref 0.61–1.24)
Glucose, Bld: 99 mg/dL (ref 65–99)
POTASSIUM: 3.7 mmol/L (ref 3.5–5.1)
SODIUM: 135 mmol/L (ref 135–145)
Total Bilirubin: 1.1 mg/dL (ref 0.3–1.2)
Total Protein: 6.8 g/dL (ref 6.5–8.1)

## 2015-10-18 LAB — LACTATE DEHYDROGENASE: LDH: 229 U/L — ABNORMAL HIGH (ref 98–192)

## 2015-10-18 LAB — CBC WITH DIFFERENTIAL/PLATELET
Basophils Absolute: 0.1 10*3/uL (ref 0.0–0.1)
Basophils Relative: 2 %
EOS ABS: 0.4 10*3/uL (ref 0.0–0.7)
EOS PCT: 7 %
HCT: 39.2 % (ref 39.0–52.0)
Hemoglobin: 13.8 g/dL (ref 13.0–17.0)
LYMPHS ABS: 1.8 10*3/uL (ref 0.7–4.0)
LYMPHS PCT: 33 %
MCH: 33.3 pg (ref 26.0–34.0)
MCHC: 35.2 g/dL (ref 30.0–36.0)
MCV: 94.7 fL (ref 78.0–100.0)
MONO ABS: 0.5 10*3/uL (ref 0.1–1.0)
Monocytes Relative: 9 %
Neutro Abs: 2.6 10*3/uL (ref 1.7–7.7)
Neutrophils Relative %: 49 %
PLATELETS: 172 10*3/uL (ref 150–400)
RBC: 4.14 MIL/uL — AB (ref 4.22–5.81)
RDW: 12.5 % (ref 11.5–15.5)
WBC: 5.3 10*3/uL (ref 4.0–10.5)

## 2015-10-18 LAB — SEDIMENTATION RATE: SED RATE: 5 mm/h (ref 0–16)

## 2015-10-18 LAB — C-REACTIVE PROTEIN

## 2015-10-18 MED ORDER — HEPARIN SOD (PORK) LOCK FLUSH 100 UNIT/ML IV SOLN
INTRAVENOUS | Status: AC
  Filled 2015-10-18: qty 5

## 2015-10-18 MED ORDER — HEPARIN SOD (PORK) LOCK FLUSH 100 UNIT/ML IV SOLN
500.0000 [IU] | Freq: Once | INTRAVENOUS | Status: AC
  Administered 2015-10-18: 500 [IU] via INTRAVENOUS

## 2015-10-18 MED ORDER — INFLUENZA VAC SPLIT QUAD 0.5 ML IM SUSY
0.5000 mL | PREFILLED_SYRINGE | Freq: Once | INTRAMUSCULAR | Status: AC
  Administered 2015-10-18: 0.5 mL via INTRAMUSCULAR
  Filled 2015-10-18: qty 0.5

## 2015-10-18 MED ORDER — SODIUM CHLORIDE 0.9% FLUSH
20.0000 mL | INTRAVENOUS | Status: DC | PRN
  Administered 2015-10-18: 20 mL via INTRAVENOUS
  Filled 2015-10-18: qty 20

## 2015-10-18 NOTE — Patient Instructions (Addendum)
[place] at [facility] Discharge Instructions  RECOMMENDATIONS MADE BY THE CONSULTANT AND ANY TEST RESULTS WILL BE SENT TO YOUR REFERRING PHYSICIAN.  You saw Dr. Whitney Muse today. Follow up in 3 months with labs. Flu vaccine today. You are being referred to Dr. Roxan Hockey, a cardiothoracic surgeon, (or one of his associates) to evaluate your ascending thoracic aortic aneurysm  Thank you for choosing [place] at [facility] to provide your oncology and hematology care.  To afford each patient quality time with our provider, please arrive at least 15 minutes before your scheduled appointment time.   Beginning January 23rd 2017 lab work for the [facility] will be done in the  Main lab at [facility] on 1st floor. If you have a lab appointment with the [facility] please come in thru the  Main Entrance and check in at the main information desk  You need to re-schedule your appointment should you arrive 10 or more minutes late.  We strive to give you quality time with our providers, and arriving late affects you and other patients whose appointments are after yours.  Also, if you no show three or more times for appointments you may be dismissed from the clinic at the providers discretion.     Again, thank you for choosing [facility].  Our hope is that these requests will decrease the amount of time that you wait before being seen by our physicians.       _____________________________________________________________  Should you have questions after your visit to [facility], please contact our office at [phone] between the hours of 8:30 a.m. and 4:30 p.m.  Voicemails left after 4:30 p.m. will not be returned until the following business day.  For prescription refill requests, have your pharmacy contact our office.         Resources For Cancer Patients and their Caregivers ? American Cancer Society: Can  assist with transportation, wigs, general needs, runs Look Good Feel Better.        401-150-8459 ? Cancer Care: Provides financial assistance, online support groups, medication/co-pay assistance.  1-800-813-HOPE 561-588-4890) ? [place] Assists [place] cancer patients and their families through emotional , educational and financial support.  564-038-3096 ? [place] DSS Where to apply for food stamps, Medicaid and utility assistance. (863)746-6891 ? RCATS: Transportation to medical appointments. 469-498-4884 ? Social Security Administration: May apply for disability if have a Stage IV cancer. 250 291 6097 (540)042-5891 ? [employer], Disability and Transit Services: Assists with nutrition, care and transit needs. [place] Support Programs: @10RELATIVEDAYS @ > Cancer Support Group  2nd Tuesday of the month 1pm-2pm, Journey Room  > Creative Journey  3rd Tuesday of the month 1130am-1pm, Journey Room  > Look Good Feel Better  1st Wednesday of the month 10am-12 noon, Journey Room (Call [place] to register [phone])

## 2015-10-18 NOTE — Progress Notes (Signed)
Douglas Bryant presents today for injection per MD orders. Flu vaccine administered IM in left Upper Arm. Administration without incident. Patient tolerated well.

## 2015-10-18 NOTE — Patient Instructions (Signed)
[place] at [facility] Discharge Instructions  RECOMMENDATIONS MADE BY THE CONSULTANT AND ANY TEST RESULTS WILL BE SENT TO YOUR REFERRING PHYSICIAN.  Port flush and peripheral lab draw today. Follow-up as scheduled. Call clinic for any questions or concerns  Thank you for choosing [place] at [facility] to provide your oncology and hematology care.  To afford each patient quality time with our provider, please arrive at least 15 minutes before your scheduled appointment time.   Beginning January 23rd 2017 lab work for the [facility] will be done in the  Main lab at [facility] on 1st floor. If you have a lab appointment with the [facility] please come in thru the  Main Entrance and check in at the main information desk  You need to re-schedule your appointment should you arrive 10 or more minutes late.  We strive to give you quality time with our providers, and arriving late affects you and other patients whose appointments are after yours.  Also, if you no show three or more times for appointments you may be dismissed from the clinic at the providers discretion.     Again, thank you for choosing [facility].  Our hope is that these requests will decrease the amount of time that you wait before being seen by our physicians.       _____________________________________________________________  Should you have questions after your visit to [facility], please contact our office at [phone] between the hours of 8:30 a.m. and 4:30 p.m.  Voicemails left after 4:30 p.m. will not be returned until the following business day.  For prescription refill requests, have your pharmacy contact our office.         Resources For Cancer Patients and their Caregivers ? American Cancer Society: Can assist with transportation, wigs, general needs, runs Look Good Feel Better.        [phone] ? Cancer Care: Provides  financial assistance, online support groups, medication/co-pay assistance.  1-800-813-HOPE 931 590 6864) ? [place] Assists [place] cancer patients and their families through emotional , educational and financial support.  731-250-1628 ? [place] DSS Where to apply for food stamps, Medicaid and utility assistance. 6826603386 ? RCATS: Transportation to medical appointments. (404) 464-7452 ? Social Security Administration: May apply for disability if have a Stage IV cancer. 6154826387 830-056-3952 ? [employer], Disability and Transit Services: Assists with nutrition, care and transit needs. [place] Support Programs: @10RELATIVEDAYS @ > Cancer Support Group  2nd Tuesday of the month 1pm-2pm, Journey Room  > Creative Journey  3rd Tuesday of the month 1130am-1pm, Journey Room  > Look Good Feel Better  1st Wednesday of the month 10am-12 noon, Journey Room (Call [place] to register [phone])

## 2015-10-18 NOTE — Progress Notes (Signed)
Douglas Bryant tolerated port flush and peripheral lab draw well without complaints. Port was accessed with blood return noted but not enough for lab that was ordered so blood was obtained peripherally without issues. Port flushed with 20 ml NS and 5 ml Heparin easily without complaints. Pt discharged self ambulatory in satisfactory condition

## 2015-10-19 ENCOUNTER — Telehealth (HOSPITAL_COMMUNITY): Payer: Self-pay | Admitting: *Deleted

## 2015-10-19 NOTE — Telephone Encounter (Signed)
Pt aware that labs are stable.  

## 2015-10-19 NOTE — Telephone Encounter (Signed)
-----   Message from [facility], PA-C sent at 10/18/2015  6:06 PM EDT ----- Stable

## 2015-10-24 ENCOUNTER — Encounter (HOSPITAL_COMMUNITY): Payer: Self-pay | Admitting: Psychiatry

## 2015-10-24 ENCOUNTER — Ambulatory Visit (HOSPITAL_COMMUNITY): Payer: Self-pay | Admitting: Psychology

## 2015-10-24 ENCOUNTER — Ambulatory Visit (INDEPENDENT_AMBULATORY_CARE_PROVIDER_SITE_OTHER): Payer: Medicare Other | Admitting: Psychiatry

## 2015-10-24 ENCOUNTER — Other Ambulatory Visit (HOSPITAL_COMMUNITY): Payer: Self-pay | Admitting: Psychiatry

## 2015-10-24 VITALS — BP 132/86 | HR 76 | Ht 73.0 in | Wt 196.8 lb

## 2015-10-24 DIAGNOSIS — F341 Dysthymic disorder: Secondary | ICD-10-CM

## 2015-10-24 MED ORDER — LORAZEPAM 1 MG PO TABS: ORAL_TABLET | ORAL | 4 refills | Status: DC

## 2015-10-24 NOTE — Progress Notes (Signed)
[name], male   DOB: Sep 12, 1950, 65 y.o.   MRN: [id number] [facility] MD Progress Note  10/24/2015 2:17 PM Douglas Bryant  MRN:  [id number] Subjective:  Mild depression Principal Problem: Dysthymic disorder At this time the patient seems to be stable. He denies daily persistent depression. He is mild chronic depression. He sleeps excessively about 12 hours. His energy level is reduced. His energy level and his sleep pattern been like this for years. He does describe psychomotor retardation. He denies overt worthlessness. Is not suicidal. He generally is eating fairly well. He is in remission from his lymphoma. He still enjoys music somewhat but he plays a lot of computer. He likes to read. He still gets benefits from his environment. Interestingly he cut back his alcohol only a few times a week and he smokes less marijuana 2 or 3 times a week. He sees his therapist a little bit more which is only about once a month. The patient is a very sedentary lifestyle. Today he shared with me that he was concerned about his cognition. He's concerned about his energy and concerned about balance. Today we had a long talk about his Ativan. Given his age of 38 I suspect it is now time after a decade to start reducing his Ativan. He agreed with this idea is to add Vistaril at night for sleep. Past Psychiatric History:   Past Medical History:  Past Medical History:  Diagnosis Date  . Anxiety   . Back pain   . Depression   . HTN (hypertension)     Past Surgical History:  Procedure Laterality Date  . HERNIA REPAIR     Family History:  Family History  Problem Relation Age of Onset  . Alcohol abuse Father   . Depression Sister   . Anxiety disorder Sister   . Depression Sister   . Anxiety disorder Sister    Family Psychiatric  History:  Social History:  History  Alcohol Use  . 3.6 oz/week  . 6 Cans of beer per week     History  Drug Use  . Frequency: 2.0 times per week  . Types:  Marijuana    Comment: smokes daily for thirty years. currently takes one puff    Social History   Social History  . Marital status: Divorced    Spouse name: N/A  . Number of children: N/A  . Years of education: N/A   Social History Main Topics  . Smoking status: Never Smoker  . Smokeless tobacco: Former Systems developer    Types: Chew    Quit date: 1997  . Alcohol use 3.6 oz/week    6 Cans of beer per week  . Drug use:     Frequency: 2.0 times per week    Types: Marijuana     Comment: smokes daily for thirty years. currently takes one puff  . Sexual activity: Yes    Partners: Female    Birth control/ protection: None   Other Topics Concern  . None   Social History Narrative  . None   Additional Social History:                         Sleep: Fair  Appetite:  Good  Current Medications: Current Outpatient Prescriptions  Medication Sig Dispense Refill  . acetaminophen (TYLENOL) 500 MG tablet Take 1,000 mg by mouth every 6 (six) hours as needed for mild pain.    . busPIRone (BUSPAR) 10  MG tablet 1  Bid withfood (Patient not taking: Reported on 09/10/2015) 60 tablet 3  . chlorpheniramine (CHLOR-TRIMETON) 4 MG tablet Take 4 mg by mouth 2 (two) times daily as needed for allergies.    Marland Kitchen HYDROcodone-acetaminophen (NORCO/VICODIN) 5-325 MG per tablet Take 1 tablet by mouth every 4 (four) hours as needed for moderate pain.    Marland Kitchen levothyroxine (SYNTHROID) 25 MCG tablet Take 25 mcg by mouth daily before breakfast.    . LORazepam (ATIVAN) 1 MG tablet 1  bid 60 tablet 4  . pantoprazole (PROTONIX) 40 MG tablet Take 40 mg by mouth daily.    . valACYclovir (VALTREX) 500 MG tablet Take 500 mg by mouth daily as needed (Herpes Virus).    . Vortioxetine HBr (BRINTELLIX) 5 MG TABS 1  qam (Patient not taking: Reported on 09/10/2015) 30 tablet 3   No current facility-administered medications for this visit.     Lab Results: No results found for this or any previous visit (from the past 48  hour(s)).  Blood Alcohol level:  No results found for: [facility]  Physical Findings: AIMS:  , ,  ,  ,    CIWA:    COWS:     Musculoskeletal: Strength & Muscle Tone: within normal limits Gait & Station: normal Patient leans: N/A  Psychiatric Specialty Exam: ROS  Blood pressure 132/86, pulse 76, height 6\' 1"  (1.854 m), weight 196 lb 12.8 oz (89.3 kg).Body mass index is 25.96 kg/m.  General Appearance: Casual  Eye Contact::  Good  Speech:  Clear and Coherent  Volume:  Normal  Mood:  Euthymic  Affect:  Congruent  Thought Process:  Coherent  Orientation:  Full (Time, Place, and Person)  Thought Content:  WDL  Suicidal Thoughts:  No  Homicidal Thoughts:  No  Memory:  NA  Judgement:  Good  Insight:  Fair  Psychomotor Activity:  Normal  Concentration:  Good  Recall:  Good  Fund of Knowledge:Good  Language: Fair  Akathisia:  No  Handed:  Right  AIMS (if indicated):     Assets:  Desire for Improvement  ADL's:  Intact  Cognition: WNL  Sleep:      Treatment Plan Summary: At this time we will reduce his Ativan to taking 1 mg twice a day. He'll begin on this drill 25 mg taking one or 2 at night. This medicine was called into his pharmacy as was heart findings in our computer system. The patient is not suicidal. Again his alcohol and marijuana have lessened. The patient does very little. He knows his landlord on gets pain 4. Haskel Schroeder, MD 10/24/2015, 2:17 PM

## 2015-10-27 ENCOUNTER — Encounter (HOSPITAL_COMMUNITY): Payer: Self-pay | Admitting: Hematology & Oncology

## 2015-11-27 ENCOUNTER — Encounter: Payer: Self-pay | Admitting: Thoracic Surgery (Cardiothoracic Vascular Surgery)

## 2015-11-27 ENCOUNTER — Institutional Professional Consult (permissible substitution) (INDEPENDENT_AMBULATORY_CARE_PROVIDER_SITE_OTHER): Payer: Medicare Other | Admitting: Thoracic Surgery (Cardiothoracic Vascular Surgery)

## 2015-11-27 VITALS — BP 140/91 | HR 81 | Resp 20 | Ht 73.0 in | Wt 196.0 lb

## 2015-11-27 DIAGNOSIS — I7121 Aneurysm of the ascending aorta, without rupture: Secondary | ICD-10-CM

## 2015-11-27 DIAGNOSIS — I712 Thoracic aortic aneurysm, without rupture: Secondary | ICD-10-CM | POA: Insufficient documentation

## 2015-11-27 NOTE — Progress Notes (Signed)
PCP is [name], MD Referring Provider is Penland, Kelby Fam, MD  Chief Complaint  Patient presents with  . Thoracic Aortic Aneurysm    Surgical eval, Chest CT 10/17/15,     HPI: Douglas Bryant is a 65 year old man with a history of stage IV B-cell non-Hodgkin's lymphoma, borderline hypertension, anxiety and depression. He worked as a Education officer, museum with a minimally ill retiring. He is a lifelong nonsmoker. He was diagnosed with lymphoma in May 2015. He completed treatment in October 2015 and has been in remission since then. He recently had a 6 month follow-up with a CT of the chest, abdomen and pelvis. 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  He says that since he found out about the aneurysm he has been feeling some vague chest discomfort, but thinks is probably indigestion. He's not having any shortness of breath or severe chest pain.   Past Medical History:  Diagnosis Date  . Anxiety   . Asthma due to seasonal allergies   . Back pain   . Depression   . HTN (hypertension)     Past Surgical History:  Procedure Laterality Date  . HERNIA REPAIR    . RHINOPLASTY      Family History  Problem Relation Age of Onset  . Alcohol abuse Father   . Depression Sister   . Anxiety disorder Sister   . Depression Sister   . Anxiety disorder Sister     Social History Social History  Substance Use Topics  . Smoking status: Never Smoker  . Smokeless tobacco: Former Systems developer    Types: Chew    Quit date: 1997  . Alcohol use 3.6 oz/week    6 Cans of beer per week    Current Outpatient Prescriptions  Medication Sig Dispense Refill  . acetaminophen (TYLENOL) 500 MG tablet Take 1,000 mg by mouth every 6 (six) hours as needed for mild pain.    . chlorpheniramine (CHLOR-TRIMETON) 4 MG tablet Take 4 mg by mouth 2 (two) times daily as needed for allergies.    . hydrOXYzine (VISTARIL) 25 MG capsule 25 mg at bedtime as needed.     Marland Kitchen levothyroxine (SYNTHROID,  LEVOTHROID) 50 MCG tablet Take 50 mcg by mouth daily before breakfast.     . LORazepam (ATIVAN) 1 MG tablet 1  bid 60 tablet 4  . valACYclovir (VALTREX) 500 MG tablet Take 500 mg by mouth daily as needed (Herpes Virus).     No current facility-administered medications for this visit.     Allergies  Allergen Reactions  . Flomax [Tamsulosin Hcl] Other (See Comments)    Review of Systems  Constitutional: Positive for fatigue. Negative for activity change, chills, diaphoresis and fever.  HENT: Negative for voice change.   Respiratory: Negative for shortness of breath and wheezing.   Cardiovascular: Positive for chest pain. Negative for palpitations and leg swelling.  Gastrointestinal: Negative for blood in stool.       Indigestion  Genitourinary: Positive for frequency. Negative for dysuria.  Musculoskeletal: Negative for arthralgias and joint swelling.  Neurological: Positive for weakness (Subjective) and numbness.  Psychiatric/Behavioral: Positive for dysphoric mood. The patient is nervous/anxious.   All other systems reviewed and are negative.   BP (!) 140/91   Pulse 81   Resp 20   Ht 6\' 1"  (1.854 m)   Wt 196 lb (88.9 kg)   SpO2 98% Comment: RA  BMI 25.86 kg/m  Physical Exam  Constitutional: He  is oriented to person, place, and time. He appears well-developed and well-nourished. No distress.  HENT:  Head: Normocephalic and atraumatic.  Mouth/Throat: No oropharyngeal exudate.  Eyes: Conjunctivae and EOM are normal. No scleral icterus.  Neck: Neck supple. No thyromegaly present.  No carotid bruits  Cardiovascular: Normal rate, regular rhythm, normal heart sounds and intact distal pulses.  Exam reveals no gallop and no friction rub.   No murmur heard. Pulmonary/Chest: Effort normal and breath sounds normal. No respiratory distress. He has no wheezes.  Abdominal: Soft. He exhibits no distension. There is no tenderness.  Musculoskeletal: He exhibits no edema.   Lymphadenopathy:    He has no cervical adenopathy.  Neurological: He is alert and oriented to person, place, and time. No cranial nerve deficit.  Motor intact  Skin: Skin is warm and dry.  Vitals reviewed.    Diagnostic Tests: CT CHEST, ABDOMEN, AND PELVIS WITH CONTRAST  TECHNIQUE: Multidetector CT imaging of the chest, abdomen and pelvis was performed following the standard protocol during bolus administration of intravenous contrast.  CONTRAST:  147mL ISOVUE-300 IOPAMIDOL (ISOVUE-300) INJECTION 61%  COMPARISON:  03/23/2014.  PET-CT 11/28/2013.  FINDINGS: CT CHEST FINDINGS  Cardiovascular: Heart size is normal. Coronary artery calcification is noted. Ascending thoracic aorta measures up to 4.3 cm in maximum diameter. Right Port-A-Cath tip is positioned at the mid SVC level.  Mediastinum/Nodes: No mediastinal lymphadenopathy. There is no hilar lymphadenopathy. The esophagus has normal imaging features. There is no axillary lymphadenopathy.  Lungs/Pleura: Stable appearance of lingular scarring. Platelike scarring posterior right lower lobe is also stable. No focal airspace consolidation. No pulmonary nodule or mass. No evidence for pulmonary edema or pleural effusion.  Musculoskeletal: Bone windows reveal no worrisome lytic or sclerotic osseous lesions.  CT ABDOMEN PELVIS FINDINGS  Hepatobiliary: No focal abnormality within the liver parenchyma. There is no evidence for gallstones, gallbladder wall thickening, or pericholecystic fluid. No intrahepatic or extrahepatic biliary dilation.  Pancreas: No focal mass lesion. No dilatation of the main duct. No intraparenchymal cyst. No peripancreatic edema.  Spleen: Hypo attenuating lesions seen in the anterior spleen on the prior study are less conspicuous today.  Adrenals/Urinary Tract: No adrenal nodule or mass. Kidneys are unremarkable. Tiny cortical lesions left kidney are too small to characterize but  likely represent cysts. No evidence for hydroureter. The urinary bladder appears normal for the degree of distention.  Stomach/Bowel: Stomach is nondistended. No gastric wall thickening. No evidence of outlet obstruction. Duodenum is normally positioned as is the ligament of Treitz. No small bowel wall thickening. No small bowel dilatation. The terminal ileum is normal. The appendix is normal. No gross colonic mass. No colonic wall thickening. No substantial diverticular change.  Vascular/Lymphatic: There is abdominal aortic atherosclerosis without aneurysm. There is no gastrohepatic or hepatoduodenal ligament lymphadenopathy. No intraperitoneal or retroperitoneal lymphadenopathy. No pelvic sidewall lymphadenopathy.  Reproductive: The prostate gland and seminal vesicles have normal imaging features.  Other: No intraperitoneal free fluid.  Musculoskeletal: 2 cm lucent lesion right iliac crest unchanged since 03/23/2014. Bone windows reveal no worrisome lytic or sclerotic osseous lesions.  IMPRESSION: 1. Stable exam. No evidence for lymphadenopathy in the chest, abdomen, or pelvis. No new or progressive interval findings. 2. 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 Recommend annual imaging followup by CTA or MRA. This recommendation follows 2010 ACCF/AHA/AATS/ACR/ASA/SCA/SCAI/SIR/STS/SVM Guidelines for the Diagnosis and Management of Patients with Thoracic Aortic Disease. Circulation. 2010; 121: [id number]. 3. Coronary artery atherosclerosis. 4. Stable scarring lingula and right lower lobe. 5.  Previously seen hypo attenuating lesion anterior spleen is less conspicuous on today's study.   Electronically Signed   By: Misty Stanley M.D.   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 Looking back at his previous CTs including the first one in our system from May 2015, the aneurysm was present at that time and is unchanged in the interim.  There is no indication for surgery at the present time. Indications for size would be 5.5-6 cm or an increase of greater than 5 mm between scans.  He will need at least annual follow-up of the aneurysm with CT or MR. He currently is getting CT scans on a six-month interval basis for his lymphoma.  His blood pressure was borderline elevated today. Rather than start him on medication based on 1 borderline abnormal reading I recommended he follow up with Dr. Trena Platt office to have his blood pressure checked sometime next week.  Plan: Return after next CT scan for follow-up(6 months)  Melrose Nakayama, MD [facility] [phone]

## 2015-11-28 NOTE — Progress Notes (Signed)
Patient:  Douglas Bryant   DOB: January 26, 1951  MR Number: [id number]  Location: [facility] [address] [place] [address] Dept: [phone]  Start: 3 PM End: 4 PM  Provider/Observer:     Edgardo Roys PSYD  Chief Complaint:      Chief Complaint  Patient presents with  . Anxiety  . Depression  . Stress    Reason For Service:     The patient [facility] for issues of depression. He described significant stressors have it with financial difficulties, his relationships, and his living situation. He describes excessive worrying, low energy, anxiety and depression. The patient reports he rightly worries about money and where he is going to live. He reports that the symptoms of been going on for the past four years. The patient reports that he has experienced depression for a long time and was treated by psychiatry in [place] and continues to be followed there. He was seen for counseling with Serafina Mitchell in the past. He reported two years ago things change. The patient had a son with his second wife who lived in [place]. As a teenager the sun was kicked out of the home and the patient was living in [employer] [place]. The patient for city took over and the patient eventually moved back to [place] and to live here and keep the sun. The patient portion got very unhappy living here in [place] became depressed about his living situation. He reports it now he is stuck living here and taking care of his sister who is disabled from polio. His son is now 44 years old and is going to [employer]. 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  Interventions Strategy:  Cognitive/behavioral psychotherapeutic intervention  Participation Level:   Active  Participation Quality:  Appropriate       Behavioral Observation:  Well Groomed, Alert, and Appropriate.   Current Psychosocial Factors: The patient returns reporting that he is still struggling with financial issues. However, he reports that he has been actively working on furthering the development of his coping skills and strategies.  Content of Session:   Review current symptoms and worked on therapeutic interventions around issues of dysthymic disorder and depression.  Current Status:   Patient reports that he has been working on figuring out his long-term plans for where he is can live and what can happen with his relationship with his girlfriend. He has been doing better with how to deal with his son.  Patient Progress:   stable  Target Goals:   Target goals include building better coping skills and strategies around the room with his current situation is financial difficulties particularly his medical debts.  Last Reviewed:   08/16/2015  Goals Addressed Today:    Goals addressed today had to do with building better coping skills and strategies in following up on the work that was being done in [place] at the behavioral health clinic there.  Impression/Diagnosis:   The patient has a history of increasing symptoms of depression and stress as well as dysthymic types of symptoms. Low energy, worrying about money, and his current living situation are all playing a major role.  Diagnosis:    Axis I: Dysthymic disorder

## 2015-12-25 ENCOUNTER — Ambulatory Visit (HOSPITAL_COMMUNITY): Payer: Self-pay | Admitting: Psychology

## 2015-12-28 ENCOUNTER — Ambulatory Visit (INDEPENDENT_AMBULATORY_CARE_PROVIDER_SITE_OTHER): Payer: Medicare Other | Admitting: Psychology

## 2015-12-28 DIAGNOSIS — F4323 Adjustment disorder with mixed anxiety and depressed mood: Secondary | ICD-10-CM | POA: Diagnosis not present

## 2015-12-28 DIAGNOSIS — F341 Dysthymic disorder: Secondary | ICD-10-CM | POA: Diagnosis not present

## 2016-01-04 ENCOUNTER — Ambulatory Visit (HOSPITAL_COMMUNITY): Payer: Self-pay | Admitting: Psychology

## 2016-01-17 ENCOUNTER — Encounter (HOSPITAL_COMMUNITY): Payer: Self-pay | Admitting: Oncology

## 2016-01-17 ENCOUNTER — Encounter (HOSPITAL_COMMUNITY): Payer: Medicare Other | Attending: Oncology | Admitting: Oncology

## 2016-01-17 ENCOUNTER — Encounter (HOSPITAL_COMMUNITY): Payer: Medicare Other

## 2016-01-17 ENCOUNTER — Other Ambulatory Visit (HOSPITAL_COMMUNITY): Payer: Self-pay

## 2016-01-17 VITALS — BP 123/80 | HR 63 | Temp 98.2°F | Resp 18 | Ht 73.0 in | Wt 199.0 lb

## 2016-01-17 DIAGNOSIS — Z9889 Other specified postprocedural states: Secondary | ICD-10-CM | POA: Insufficient documentation

## 2016-01-17 DIAGNOSIS — Z9221 Personal history of antineoplastic chemotherapy: Secondary | ICD-10-CM | POA: Diagnosis not present

## 2016-01-17 DIAGNOSIS — C8330 Diffuse large B-cell lymphoma, unspecified site: Secondary | ICD-10-CM | POA: Diagnosis not present

## 2016-01-17 DIAGNOSIS — G4700 Insomnia, unspecified: Secondary | ICD-10-CM

## 2016-01-17 DIAGNOSIS — Z5189 Encounter for other specified aftercare: Secondary | ICD-10-CM | POA: Diagnosis not present

## 2016-01-17 LAB — COMPREHENSIVE METABOLIC PANEL
ALT: 26 U/L (ref 17–63)
AST: 28 U/L (ref 15–41)
Albumin: 4.1 g/dL (ref 3.5–5.0)
Alkaline Phosphatase: 33 U/L — ABNORMAL LOW (ref 38–126)
Anion gap: 5 (ref 5–15)
BUN: 13 mg/dL (ref 6–20)
CHLORIDE: 104 mmol/L (ref 101–111)
CO2: 27 mmol/L (ref 22–32)
Calcium: 9 mg/dL (ref 8.9–10.3)
Creatinine, Ser: 0.87 mg/dL (ref 0.61–1.24)
GFR calc non Af Amer: >60 mL/min (ref >60)
Glucose, Bld: 96 mg/dL (ref 65–99)
POTASSIUM: 3.7 mmol/L (ref 3.5–5.1)
SODIUM: 136 mmol/L (ref 135–145)
Total Bilirubin: 0.7 mg/dL (ref 0.3–1.2)
Total Protein: 6.8 g/dL (ref 6.5–8.1)

## 2016-01-17 LAB — CBC WITH DIFFERENTIAL/PLATELET
Basophils Absolute: 0.1 10*3/uL (ref 0.0–0.1)
Basophils Relative: 3 %
EOS ABS: 0.6 10*3/uL (ref 0.0–0.7)
EOS PCT: 11 %
HCT: 40.6 % (ref 39.0–52.0)
Hemoglobin: 14.3 g/dL (ref 13.0–17.0)
LYMPHS ABS: 1.7 10*3/uL (ref 0.7–4.0)
Lymphocytes Relative: 34 %
MCH: 33.9 pg (ref 26.0–34.0)
MCHC: 35.2 g/dL (ref 30.0–36.0)
MCV: 96.2 fL (ref 78.0–100.0)
MONO ABS: 0.6 10*3/uL (ref 0.1–1.0)
MONOS PCT: 11 %
Neutro Abs: 2.1 10*3/uL (ref 1.7–7.7)
Neutrophils Relative %: 41 %
PLATELETS: 159 10*3/uL (ref 150–400)
RBC: 4.22 MIL/uL (ref 4.22–5.81)
RDW: 12.5 % (ref 11.5–15.5)
WBC: 5.2 10*3/uL (ref 4.0–10.5)

## 2016-01-17 LAB — LACTATE DEHYDROGENASE: LDH: 200 U/L — AB (ref 98–192)

## 2016-01-17 MED ORDER — SODIUM CHLORIDE 0.9% FLUSH
20.0000 mL | INTRAVENOUS | Status: DC | PRN
  Administered 2016-01-17: 20 mL via INTRAVENOUS
  Filled 2016-01-17: qty 20

## 2016-01-17 MED ORDER — HEPARIN SOD (PORK) LOCK FLUSH 100 UNIT/ML IV SOLN
INTRAVENOUS | Status: AC
  Filled 2016-01-17: qty 5

## 2016-01-17 MED ORDER — HEPARIN SOD (PORK) LOCK FLUSH 100 UNIT/ML IV SOLN
500.0000 [IU] | Freq: Once | INTRAVENOUS | Status: AC
  Administered 2016-01-17: 500 [IU] via INTRAVENOUS

## 2016-01-17 NOTE — Progress Notes (Signed)
[facility], MD [address] [place] [address]  Diffuse large B-cell lymphoma, unspecified body region [facility]) - Plan: CBC with Differential, Comprehensive metabolic panel, Lactate dehydrogenase, CBC with Differential, Comprehensive metabolic panel, Lactate dehydrogenase  CURRENT THERAPY: Surveillance according with NCCN guidelines  INTERVAL HISTORY: Douglas Bryant 65 y.o. male returns for followup of Stage IV DLBCL, S/P R-CHOP x 6 cycles by Dr. [name] at [facility] with last cycle being administered on 10/27/2013.    DLBCL (diffuse large B cell lymphoma) ([place])   07/07/2013 Initial Biopsy    CT guided right iliac bone marrow aspiration and core biopsy.      07/07/2013 Pathology Results    Bone Marrow Flow Cytometry - PREDOMINANCE OF T-LYMPHOCYTES WITH NONSPECIFIC REVERSAL OF THE CD4:CD8 RATIO. - NO MONOCLONAL B-CELL POPULATION IDENTIFIED. Bone Marrow, Aspirate,Biopsy, and Clot, right iliac - SLIGHTLY HYPERCELLULAR BONE MARROW FOR AGE WITH TRILINEAGE HEMATOPOIESIS. - NEGATIVE FOR LYMPHOMA - SEE COMMENT. PERIPHERAL BLOOD: - NORMOCYTIC-NORMOCHROMIC ANEMIA. - NEUTROPHILIC LEFT SHIFT.      07/13/2013 - 10/27/2013 Chemotherapy    R-CHOP x 6 cycles by Dr. [name] at [facility]       09/01/2013 PET scan    Minimal metabolic activity associated with the splenic lesion. Otherwise complete response to chemotherapy. No residual hypermetabolic lymph nodes in the skullbase to thigh PET scan. Marked reduction in  lingular consolidation      09/01/2013 PET scan    1. Minimal metabolic activity associated with the splenic lesion. Otherwise complete response to chemotherapy. 1. No residual hypermetabolic lymph nodes in the skullbase to thigh PET scan.  2. Marked reduction in  lingular consolidation      11/28/2013 PET scan    1. Today's study is very similar to prior study from 09/01/2013. Specifically, the ill-defined low-attenuation lesion in the anterior aspect of the spleen is  slightly smaller, currently measuring 2.1 x 2.3 cm, and continues to demonstrate some very low-level metabolic activity (SUVmax = 2.9). 2. There is a persistent mass-like opacity in the posterior aspect of the left upper lobe, which also appears slightly smaller than the prior examination. This too demonstrates some low-level metabolic activity (SUVmax = 2.7). While this may simply represent a resolving post infectious or inflammatory scar, if there is any clinical concern that the opacity on the original study from 06/09/2013 that this was in fact a lymphomatous infiltrate, this could represent a residual focus of disease. 3. No new foci of disease noted in the neck, chest, abdomen or pelvis. 4. Atherosclerosis, including left main and 3 vessel coronary artery disease. Please note that although the presence of coronary artery calcium documents the presence of coronary artery disease, the severity of this disease and any potential stenosis cannot be assessed on this non-gated CT examination. Assessment for potential risk factor modification, dietary therapy or pharmacologic therapy may be warranted, if clinically indicated. 5. Mild cardiomegaly.      07/17/2015 Initial Diagnosis    DLBCL (diffuse large B cell lymphoma) ([place])     10/17/2015 Imaging    CT CAP- Stable exam. No evidence for lymphadenopathy in the chest, abdomen, or pelvis. No new or progressive interval findings. 2. 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       He is doing well from a hematologic standpoint.  He denies any B symptoms.  His weight is stable and appetite is good.  He denies any unintentional weight loss or early satiety.    He is having  issues with sleeping and I have given him some OTC options.  Otherwise, he is advised to follow-up with his primary care provider.  I reviewed the NCCN guidelines pertaining to surveillance for Stage IV DLBCL.  Review of Systems    Constitutional: Negative.  Negative for chills, fever and weight loss.  HENT: Negative.   Eyes: Negative.   Respiratory: Negative.  Negative for cough.   Cardiovascular: Negative.  Negative for chest pain.  Gastrointestinal: Negative.  Negative for abdominal pain, constipation, diarrhea, nausea and vomiting.  Genitourinary: Negative.   Musculoskeletal: Negative.   Skin: Negative.   Neurological: Negative.  Negative for weakness.  Endo/Heme/Allergies: Negative.   Psychiatric/Behavioral: Negative.     Past Medical History:  Diagnosis Date  . Anxiety   . Asthma due to seasonal allergies   . Back pain   . Depression   . DLBCL (diffuse large B cell lymphoma) ([place]) 07/17/2015  . HTN (hypertension)     Past Surgical History:  Procedure Laterality Date  . HERNIA REPAIR    . RHINOPLASTY      Family History  Problem Relation Age of Onset  . Alcohol abuse Father   . Depression Sister   . Anxiety disorder Sister   . Depression Sister   . Anxiety disorder Sister     Social History   Social History  . Marital status: Divorced    Spouse name: N/A  . Number of children: N/A  . Years of education: N/A   Social History Main Topics  . Smoking status: Never Smoker  . Smokeless tobacco: Former Systems developer    Types: Chew    Quit date: 1997  . Alcohol use 3.6 oz/week    6 Cans of beer per week  . Drug use:     Frequency: 2.0 times per week    Types: Marijuana     Comment: smokes daily for thirty years. currently takes one puff  . Sexual activity: Yes    Partners: Female    Birth control/ protection: None   Other Topics Concern  . None   Social History Narrative  . None     PHYSICAL EXAMINATION  ECOG PERFORMANCE STATUS: 0 - Asymptomatic  Vitals:   01/17/16 1145  BP: 123/80  Pulse: 63  Resp: 18  Temp: 98.2 F (36.8 C)    GENERAL:alert, no distress, well nourished, well developed, comfortable, cooperative, smiling and unaccompanied. SKIN: skin color, texture, turgor  are normal, no rashes or significant lesions HEAD: Normocephalic, No masses, lesions, tenderness or abnormalities EYES: normal, EOMI, Conjunctiva are pink and non-injected EARS: External ears normal OROPHARYNX:lips, buccal mucosa, and tongue normal and mucous membranes are moist  NECK: supple, no adenopathy, trachea midline LYMPH:  no palpable lymphadenopathy BREAST:not examined LUNGS: clear to auscultation  HEART: regular rate & rhythm ABDOMEN:abdomen soft and normal bowel sounds BACK: Back symmetric, no curvature. EXTREMITIES:less then 2 second capillary refill, no joint deformities, effusion, or inflammation, no skin discoloration, no cyanosis  NEURO: alert & oriented x 3 with fluent speech, no focal motor/sensory deficits, gait normal   LABORATORY DATA: CBC    Component Value Date/Time   WBC 5.3 10/18/2015 1430   RBC 4.14 (L) 10/18/2015 1430   HGB 13.8 10/18/2015 1430   HCT 39.2 10/18/2015 1430   PLT 172 10/18/2015 1430   MCV 94.7 10/18/2015 1430   MCH 33.3 10/18/2015 1430   MCHC 35.2 10/18/2015 1430   RDW 12.5 10/18/2015 1430   LYMPHSABS 1.8 10/18/2015 1430   MONOABS 0.5  10/18/2015 1430   EOSABS 0.4 10/18/2015 1430   BASOSABS 0.1 10/18/2015 1430      Chemistry      Component Value Date/Time   NA 135 10/18/2015 1430   K 3.7 10/18/2015 1430   CL 103 10/18/2015 1430   CO2 25 10/18/2015 1430   BUN 16 10/18/2015 1430   CREATININE 0.96 10/18/2015 1430      Component Value Date/Time   CALCIUM 9.0 10/18/2015 1430   ALKPHOS 30 (L) 10/18/2015 1430   AST 32 10/18/2015 1430   ALT 26 10/18/2015 1430   BILITOT 1.1 10/18/2015 1430        PENDING LABS:   RADIOGRAPHIC STUDIES:  No results found.   PATHOLOGY:    ASSESSMENT AND PLAN:  DLBCL (diffuse large B cell lymphoma) (HCC) Stage IV DLBCL, S/P R-CHOP x 6 cycles by Dr. [name] at [facility] with last cycle being administered on 10/27/2013.  Oncology history is updated with more details.  Labs today with port  flush: CBC diff, CMET, LDH.  Port flush every 6-8 weeks  Labs in 4 months: CBC diff, CMET, LDH.  I have provided him with some OTC sleeping aids that he can purchase including Melatonin, Valerian Root, Tylenol PM, Benadryl.  I will defer any prescription strength sleeping aids to his primary care provider.  I have messaged Dr. Roxan Hockey regarding the patient's upcoming surveillance plan which will no longer include routine imaging now that the patient is 2+ years out from systemic chemotherapy.  Dr. Roxan Hockey is following the patient thoracic aneurysm.  He was last seen by Dr. Roxan Hockey in October 2017 with an anticipated follow-up in April 2018.  Return in 4 months for follow-up.   ORDERS PLACED FOR THIS ENCOUNTER: Orders Placed This Encounter  Procedures  . CBC with Differential  . Comprehensive metabolic panel  . Lactate dehydrogenase  . CBC with Differential  . Comprehensive metabolic panel  . Lactate dehydrogenase    MEDICATIONS PRESCRIBED THIS ENCOUNTER: No orders of the defined types were placed in this encounter.   THERAPY PLAN:  NCCN guidelines recommends the follow surveillance for Stage I, II for those who asertain a complete response in the first-line treatment setting (3.2017):  A. H&P every 3-6 months for 5 years, then yearly or as clinically indicated.  B. Labs every 3-6 months for 5 years and then annually or as clinically indicated.  C. Imaging only as indicated. For those ascertaining a complete response in the Stage Bryant, IV setting in first-line treatment setting:  A. H&P every 3-6 months for 5 years, then yearly or as clinically indicated.  B. Labs every 3-6 months for 5 years and then annually or as clinically indicated.  C. CT C/A/P with contrast no more than every 6 months for 2 years after completion of treatment; then only as indicated.   All questions were answered. The patient knows to call the clinic with any problems, questions or concerns. We  can certainly see the patient much sooner if necessary.  Patient and plan discussed with Dr. Ancil Linsey and she is in agreement with the aforementioned.   This note is electronically signed by: Doy Mince 01/17/2016 12:13 PM

## 2016-01-17 NOTE — Assessment & Plan Note (Addendum)
Stage IV DLBCL, S/P R-CHOP x 6 cycles by Dr. [name] at [facility] with last cycle being administered on 10/27/2013.  Oncology history is updated with more details.  Labs today with port flush: CBC diff, CMET, LDH.  Port flush every 6-8 weeks  Labs in 4 months: CBC diff, CMET, LDH.  I have provided him with some OTC sleeping aids that he can purchase including Melatonin, Valerian Root, Tylenol PM, Benadryl.  I will defer any prescription strength sleeping aids to his primary care provider.  I have messaged Dr. Roxan Hockey regarding the patient's upcoming surveillance plan which will no longer include routine imaging now that the patient is 2+ years out from systemic chemotherapy.  Dr. Roxan Hockey is following the patient thoracic aneurysm.  He was last seen by Dr. Roxan Hockey in October 2017 with an anticipated follow-up in April 2018.  Return in 4 months for follow-up.

## 2016-01-17 NOTE — Progress Notes (Signed)
Douglas Bryant tolerated Port lab draw and flush well without complaints or incident. Port accessed with 20 gauge needle and blood drawn for labs ordered then port flushed with 20 ml NS and 5 ml Heparin per protocol then de-accessed.Pt discharged self ambulatory in satisfactory condition

## 2016-01-17 NOTE — Patient Instructions (Signed)
[place] at [facility] Discharge Instructions  RECOMMENDATIONS MADE BY THE CONSULTANT AND ANY TEST RESULTS WILL BE SENT TO YOUR REFERRING PHYSICIAN.  You were seen today by Kirby Crigler PA-C. Labs today, will call with results. Port flush done today. Return every 6-8 weeks for port flush. Labs in 3-4 months. Return in 4 months for follow up.   Thank you for choosing [place] at [facility] to provide your oncology and hematology care.  To afford each patient quality time with our provider, please arrive at least 15 minutes before your scheduled appointment time.   Beginning January 23rd 2017 lab work for the [facility] will be done in the  Main lab at [facility] on 1st floor. If you have a lab appointment with the [facility] please come in thru the  Main Entrance and check in at the main information desk  You need to re-schedule your appointment should you arrive 10 or more minutes late.  We strive to give you quality time with our providers, and arriving late affects you and other patients whose appointments are after yours.  Also, if you no show three or more times for appointments you may be dismissed from the clinic at the providers discretion.     Again, thank you for choosing [facility].  Our hope is that these requests will decrease the amount of time that you wait before being seen by our physicians.       _____________________________________________________________  Should you have questions after your visit to [facility], please contact our office at [phone] between the hours of 8:30 a.m. and 4:30 p.m.  Voicemails left after 4:30 p.m. will not be returned until the following business day.  For prescription refill requests, have your pharmacy contact our office.         Resources For Cancer Patients and their Caregivers ? American Cancer Society: Can assist with transportation, wigs,  general needs, runs Look Good Feel Better.        (236)695-4783 ? Cancer Care: Provides financial assistance, online support groups, medication/co-pay assistance.  1-800-813-HOPE (740)029-5605) ? [facility] Assists [place] cancer patients and their families through emotional , educational and financial support.  915-682-3009 ? [place] DSS Where to apply for food stamps, Medicaid and utility assistance. 9127794503 ? RCATS: Transportation to medical appointments. 313-359-7195 ? Social Security Administration: May apply for disability if have a Stage IV cancer. 440-419-2689 248-594-1024 ? [employer], Disability and Transit Services: Assists with nutrition, care and transit needs. [place] Support Programs: @10RELATIVEDAYS @ > Cancer Support Group  2nd Tuesday of the month 1pm-2pm, Journey Room  > Creative Journey  3rd Tuesday of the month 1130am-1pm, Journey Room  > Look Good Feel Better  1st Wednesday of the month 10am-12 noon, Journey Room (Call [place] to register [phone])

## 2016-01-17 NOTE — Patient Instructions (Signed)
[place] at [facility] Discharge Instructions  RECOMMENDATIONS MADE BY THE CONSULTANT AND ANY TEST RESULTS WILL BE SENT TO YOUR REFERRING PHYSICIAN. Labs drawn from portacath and port flushed per protocol. Follow-up as scheduled. Call clinic for any questions or concerns  Thank you for choosing [place] at [facility] to provide your oncology and hematology care.  To afford each patient quality time with our provider, please arrive at least 15 minutes before your scheduled appointment time.   Beginning January 23rd 2017 lab work for the [facility] will be done in the  Main lab at [facility] on 1st floor. If you have a lab appointment with the [facility] please come in thru the  Main Entrance and check in at the main information desk  You need to re-schedule your appointment should you arrive 10 or more minutes late.  We strive to give you quality time with our providers, and arriving late affects you and other patients whose appointments are after yours.  Also, if you no show three or more times for appointments you may be dismissed from the clinic at the providers discretion.     Again, thank you for choosing [facility].  Our hope is that these requests will decrease the amount of time that you wait before being seen by our physicians.       _____________________________________________________________  Should you have questions after your visit to [facility], please contact our office at [phone] between the hours of 8:30 a.m. and 4:30 p.m.  Voicemails left after 4:30 p.m. will not be returned until the following business day.  For prescription refill requests, have your pharmacy contact our office.         Resources For Cancer Patients and their Caregivers ? American Cancer Society: Can assist with transportation, wigs, general needs, runs Look Good Feel Better.        667-649-8478 ? Cancer  Care: Provides financial assistance, online support groups, medication/co-pay assistance.  1-800-813-HOPE 413-111-0038) ? [facility] Assists [place] cancer patients and their families through emotional , educational and financial support.  2295669885 ? [place] DSS Where to apply for food stamps, Medicaid and utility assistance. (478)786-8132 ? RCATS: Transportation to medical appointments. 5126449124 ? Social Security Administration: May apply for disability if have a Stage IV cancer. (581) 694-0486 5022789320 ? [employer], Disability and Transit Services: Assists with nutrition, care and transit needs. [place] Support Programs: @10RELATIVEDAYS @ > Cancer Support Group  2nd Tuesday of the month 1pm-2pm, Journey Room  > Creative Journey  3rd Tuesday of the month 1130am-1pm, Journey Room  > Look Good Feel Better  1st Wednesday of the month 10am-12 noon, Journey Room (Call [place] to register [phone])

## 2016-02-07 ENCOUNTER — Ambulatory Visit (HOSPITAL_COMMUNITY): Payer: Self-pay | Admitting: Psychology

## 2016-02-08 NOTE — Progress Notes (Signed)
Patient:  Douglas Bryant   DOB: May 30, 1950  MR Number: [id number]  Location: [facility] [address] [place] [address] Dept: [phone]  Start: 3 PM End: 4 PM  Provider/Observer:     Edgardo Roys PSYD  Chief Complaint:      Chief Complaint  Patient presents with  . Depression  . Stress  . Agitation    Reason For Service:     The patient was [facility] by [facility] for issues of depression. He described significant stressors have it with financial difficulties, his relationships, and his living situation. He describes excessive worrying, low energy, anxiety and depression. The patient reports he rightly worries about money and where he is going to live. He reports that the symptoms of been going on for the past four years. The patient reports that he has experienced depression for a long time and was treated by psychiatry in [place] and continues to be followed there. He was seen for counseling with Serafina Mitchell in the past. He reported two years ago things change. The patient had a son with his second wife who lived in [place]. As a teenager the sun was kicked out of the home and the patient was living in [place]. The patient for city took over and the patient eventually moved back to [place] and to live here and keep the sun. The patient portion got very unhappy living here in [place] became depressed about his living situation. He reports it now he is stuck living here and taking care of his sister who is disabled from polio. His son is now 1 years old and is going to [employer]. 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  Interventions Strategy:  Cognitive/behavioral psychotherapeutic intervention  Participation Level:   Active  Participation Quality:  Appropriate       Behavioral Observation:  Well Groomed, Alert, and Appropriate.   Current Psychosocial Factors: The patient returns reporting that he is still struggling with financial issues. However, he reports that he has been actively working on furthering the development of his coping skills and strategies.  Content of Session:   Review current symptoms and worked on therapeutic interventions around issues of dysthymic disorder and depression.  Current Status:   Patient reports that he has been working on figuring out his long-term plans for where he is can live and what can happen with his relationship with his girlfriend. He has been doing better with how to deal with his son.  Patient Progress:   stable  Target Goals:   Target goals include building better coping skills and strategies around the room with his current situation is financial difficulties particularly his medical debts.  Last Reviewed:   12/28/2015  Goals Addressed Today:    Goals addressed today had to do with building better coping skills and strategies in following up on the work that was being done in [place] at the behavioral health clinic there.  Impression/Diagnosis:   The patient has a history of increasing symptoms of depression and stress as well as dysthymic types of symptoms. Low energy, worrying about money, and his current living situation are all playing a major role.  Diagnosis:    Axis I: Dysthymic disorder  Adjustment disorder with mixed anxiety and depressed mood

## 2016-02-14 ENCOUNTER — Ambulatory Visit (HOSPITAL_COMMUNITY): Payer: Self-pay | Admitting: Psychology

## 2016-02-21 DIAGNOSIS — E039 Hypothyroidism, unspecified: Secondary | ICD-10-CM | POA: Diagnosis not present

## 2016-02-21 DIAGNOSIS — M549 Dorsalgia, unspecified: Secondary | ICD-10-CM | POA: Diagnosis not present

## 2016-02-21 DIAGNOSIS — C8590 Non-Hodgkin lymphoma, unspecified, unspecified site: Secondary | ICD-10-CM | POA: Diagnosis not present

## 2016-02-27 ENCOUNTER — Ambulatory Visit (INDEPENDENT_AMBULATORY_CARE_PROVIDER_SITE_OTHER): Payer: Medicare Other | Admitting: Psychiatry

## 2016-02-27 ENCOUNTER — Encounter (HOSPITAL_COMMUNITY): Payer: Self-pay | Admitting: Psychiatry

## 2016-02-27 VITALS — BP 114/82 | HR 75 | Ht 73.0 in | Wt 199.0 lb

## 2016-02-27 DIAGNOSIS — Z818 Family history of other mental and behavioral disorders: Secondary | ICD-10-CM | POA: Diagnosis not present

## 2016-02-27 DIAGNOSIS — Z811 Family history of alcohol abuse and dependence: Secondary | ICD-10-CM

## 2016-02-27 DIAGNOSIS — F341 Dysthymic disorder: Secondary | ICD-10-CM

## 2016-02-27 DIAGNOSIS — Z79899 Other long term (current) drug therapy: Secondary | ICD-10-CM

## 2016-02-27 MED ORDER — LORAZEPAM 1 MG PO TABS: ORAL_TABLET | ORAL | 4 refills | Status: DC

## 2016-02-27 MED ORDER — HYDROXYZINE PAMOATE 25 MG PO CAPS: 25.0000 mg | ORAL_CAPSULE | Freq: Every evening | ORAL | 7 refills | Status: DC | PRN

## 2016-02-27 MED ORDER — ZOLPIDEM TARTRATE 10 MG PO TABS: 10.0000 mg | ORAL_TABLET | Freq: Every evening | ORAL | 4 refills | Status: DC | PRN

## 2016-02-27 NOTE — Progress Notes (Signed)
[name], male   DOB: 1951-01-16, 66 y.o.   MRN: [id number] [facility] MD Progress Note  02/27/2016 2:05 PM Douglas Bryant  MRN:  [id number] Subjective:  Mild depression Principal Problem: Dysthymic disorder Today the patient is seen on time. He is at his baseline. He has chronic dysthymia. He has a problem with fatigue. He believes this is related to his treatment for his lymphoma. The patient has a problem with his prostate. Wakes up through the night. His sleep is clearly afflicted by this. He feels fatigued and that anytime he does things she gets tired her entire. He is eating fairly well. His energy is reduced. He denies worthlessness. He just ending with his therapist is he seeing for years. Will be reassigned to a new therapist. He denies being suicidal. He started cutting down alcohol and marijuana. Is reduced his Ativan as acquired to 1 mg twice a day. He did take a Vistaril for sleep but it makes his mouth very dry. Today the patient is requesting Ambien. He stabilization before. Patient denies psychotic symptoms. He is not suicidal. He's a great deal of problems with motivation. His son is moved out and is living with his mother. The patient continues his relationship with his girlfriend in [place] cc found. He has one other friendly this visit.  Past Medical History:  Past Medical History:  Diagnosis Date  . Anxiety   . Asthma due to seasonal allergies   . Back pain   . Depression   . DLBCL (diffuse large B cell lymphoma) ([place]) 07/17/2015  . HTN (hypertension)     Past Surgical History:  Procedure Laterality Date  . HERNIA REPAIR    . RHINOPLASTY     Family History:  Family History  Problem Relation Age of Onset  . Alcohol abuse Father   . Depression Sister   . Anxiety disorder Sister   . Depression Sister   . Anxiety disorder Sister    Family Psychiatric  History:  Social History:  History  Alcohol Use  . 3.6 oz/week  . 6 Cans of beer per week      History  Drug Use  . Frequency: 2.0 times per week  . Types: Marijuana    Comment: smokes daily for thirty years. currently takes one puff    Social History   Social History  . Marital status: Divorced    Spouse name: N/A  . Number of children: N/A  . Years of education: N/A   Social History Main Topics  . Smoking status: Never Smoker  . Smokeless tobacco: Former Systems developer    Types: Chew    Quit date: 1997  . Alcohol use 3.6 oz/week    6 Cans of beer per week  . Drug use: Yes    Frequency: 2.0 times per week    Types: Marijuana     Comment: smokes daily for thirty years. currently takes one puff  . Sexual activity: Yes    Partners: Female    Birth control/ protection: Condom   Other Topics Concern  . None   Social History Narrative  . None   Additional Social History:                         Sleep: Fair  Appetite:  Good  Current Medications: Current Outpatient Prescriptions  Medication Sig Dispense Refill  . acetaminophen (TYLENOL) 500 MG tablet Take 1,000 mg by mouth every 6 (six) hours as  needed for mild pain.    . chlorpheniramine (CHLOR-TRIMETON) 4 MG tablet Take 4 mg by mouth 2 (two) times daily as needed for allergies.    . diphenhydrAMINE (BENADRYL) 25 mg capsule Take 25 mg by mouth every 6 (six) hours as needed for allergies or sleep.    . hydrOXYzine (VISTARIL) 25 MG capsule Take 1 capsule (25 mg total) by mouth at bedtime as needed. 30 capsule 7  . levothyroxine (SYNTHROID, LEVOTHROID) 50 MCG tablet Take 50 mcg by mouth daily before breakfast.     . LORazepam (ATIVAN) 1 MG tablet 1  bid 60 tablet 4  . valACYclovir (VALTREX) 500 MG tablet Take 500 mg by mouth daily as needed (Herpes Virus).    . zolpidem (AMBIEN) 10 MG tablet Take 1 tablet (10 mg total) by mouth at bedtime as needed for sleep. 30 tablet 4   No current facility-administered medications for this visit.     Lab Results: No results found for this or any previous visit (from the  past 48 hour(s)).  Blood Alcohol level:  No results found for: [facility]  Physical Findings: AIMS:  , ,  ,  ,    CIWA:    COWS:     Musculoskeletal: Strength & Muscle Tone: within normal limits Gait & Station: normal Patient leans: N/A  Psychiatric Specialty Exam: ROS  Blood pressure 114/82, pulse 75, height 6\' 1"  (1.854 m), weight 199 lb (90.3 kg).Body mass index is 26.25 kg/m.  General Appearance: Casual  Eye Contact::  Good  Speech:  Clear and Coherent  Volume:  Normal  Mood:  Euthymic  Affect:  Congruent  Thought Process:  Coherent  Orientation:  Full (Time, Place, and Person)  Thought Content:  WDL  Suicidal Thoughts:  No  Homicidal Thoughts:  No  Memory:  NA  Judgement:  Good  Insight:  Fair  Psychomotor Activity:  Normal  Concentration:  Good  Recall:  Good  Fund of Knowledge:Good  Language: Fair  Akathisia:  No  Handed:  Right  AIMS (if indicated):     Assets:  Desire for Improvement  ADL's:  Intact  Cognition: WNL  Sleep:      Treatment Plan Summary: 02/27/2016, 2:05 PM At this time the patient will continue taking his Vistaril 25 mg 1 or 2 at night. For now we'll continue Ativan 1 mg twice a day. Patient will continue trying to be in therapy. Get a therapist in the next month. Today we'll go ahead and start her on Ambien 10 mg 6 sleep through the night. My hope is that he gets a therapist more energy and hopefully that'll translate more motivation. At this time is 66 years of age he denies chest pain shortness of breath or any physical symptoms. He actually is fairly healthy. Still plays the guitar. He'll return to see me in 5 months.

## 2016-03-13 ENCOUNTER — Encounter (HOSPITAL_COMMUNITY): Payer: Self-pay

## 2016-03-13 ENCOUNTER — Encounter (HOSPITAL_COMMUNITY): Payer: Medicare Other | Attending: Oncology

## 2016-03-13 DIAGNOSIS — Z452 Encounter for adjustment and management of vascular access device: Secondary | ICD-10-CM | POA: Diagnosis not present

## 2016-03-13 DIAGNOSIS — Z5189 Encounter for other specified aftercare: Secondary | ICD-10-CM | POA: Insufficient documentation

## 2016-03-13 DIAGNOSIS — Z9889 Other specified postprocedural states: Secondary | ICD-10-CM | POA: Insufficient documentation

## 2016-03-13 DIAGNOSIS — Z9221 Personal history of antineoplastic chemotherapy: Secondary | ICD-10-CM | POA: Insufficient documentation

## 2016-03-13 DIAGNOSIS — C8330 Diffuse large B-cell lymphoma, unspecified site: Secondary | ICD-10-CM | POA: Insufficient documentation

## 2016-03-13 MED ORDER — HEPARIN SOD (PORK) LOCK FLUSH 100 UNIT/ML IV SOLN
500.0000 [IU] | Freq: Once | INTRAVENOUS | Status: AC
  Administered 2016-03-13: 500 [IU] via INTRAVENOUS
  Filled 2016-03-13: qty 5

## 2016-03-13 MED ORDER — SODIUM CHLORIDE 0.9% FLUSH: 10.0000 mL | INTRAVENOUS | Status: DC | PRN

## 2016-03-13 NOTE — Patient Instructions (Signed)
[facility] at [facility] Discharge Instructions  RECOMMENDATIONS MADE BY THE CONSULTANT AND ANY TEST RESULTS WILL BE SENT TO YOUR REFERRING PHYSICIAN.  Port flush today. Return as scheduled.   Thank you for choosing [facility] at [facility] to provide your oncology and hematology care.  To afford each patient quality time with our provider, please arrive at least 15 minutes before your scheduled appointment time.    If you have a lab appointment with the Cancer Center please come in thru the  Main Entrance and check in at the main information desk  You need to re-schedule your appointment should you arrive 10 or more minutes late.  We strive to give you quality time with our providers, and arriving late affects you and other patients whose appointments are after yours.  Also, if you no show three or more times for appointments you may be dismissed from the clinic at the providers discretion.     Again, thank you for choosing [facility].  Our hope is that these requests will decrease the amount of time that you wait before being seen by our physicians.       _____________________________________________________________  Should you have questions after your visit to [facility], please contact our office at [phone] between the hours of 8:30 a.m. and 4:30 p.m.  Voicemails left after 4:30 p.m. will not be returned until the following business day.  For prescription refill requests, have your pharmacy contact our office.       Resources For Cancer Patients and their Caregivers ? American Cancer Society: Can assist with transportation, wigs, general needs, runs Look Good Feel Better.        1-888-227-6333 ? Cancer Care: Provides financial assistance, online support groups, medication/co-pay assistance.  1-800-813-HOPE (4673) ? [facility] Assists [place] cancer patients and their  families through emotional , educational and financial support.  336-427-4357 ? [place] DSS Where to apply for food stamps, Medicaid and utility assistance. 336-342-1394 ? RCATS: Transportation to medical appointments. 336-347-2287 ? Social Security Administration: May apply for disability if have a Stage IV cancer. 336-342-7796 1-800-772-1213 ? [place] Aging, Disability and Transit Services: Assists with nutrition, care and transit needs. 336-349-2343  Cancer Center Support Programs: @10RELATIVEDAYS@ > Cancer Support Group  2nd Tuesday of the month 1pm-2pm, Journey Room  > Creative Journey  3rd Tuesday of the month 1130am-1pm, Journey Room  > Look Good Feel Better  1st Wednesday of the month 10am-12 noon, Journey Room (Call American Cancer Society to register 1-800-395-5775)   

## 2016-03-13 NOTE — Progress Notes (Signed)
Douglas Bryant presented for Portacath access and flush.   Portacath located right chest wall accessed with  H 20 needle.  Good blood return present. Portacath flushed with 65ml NS and 500U/71ml Heparin and needle removed intact.  Procedure tolerated well and without incident.

## 2016-04-07 DIAGNOSIS — Z299 Encounter for prophylactic measures, unspecified: Secondary | ICD-10-CM | POA: Diagnosis not present

## 2016-04-07 DIAGNOSIS — C8590 Non-Hodgkin lymphoma, unspecified, unspecified site: Secondary | ICD-10-CM | POA: Diagnosis not present

## 2016-04-07 DIAGNOSIS — Z713 Dietary counseling and surveillance: Secondary | ICD-10-CM | POA: Diagnosis not present

## 2016-04-07 DIAGNOSIS — Z6826 Body mass index (BMI) 26.0-26.9, adult: Secondary | ICD-10-CM | POA: Diagnosis not present

## 2016-04-07 DIAGNOSIS — M549 Dorsalgia, unspecified: Secondary | ICD-10-CM | POA: Diagnosis not present

## 2016-04-07 DIAGNOSIS — E039 Hypothyroidism, unspecified: Secondary | ICD-10-CM | POA: Diagnosis not present

## 2016-04-08 ENCOUNTER — Other Ambulatory Visit: Payer: Self-pay | Admitting: *Deleted

## 2016-04-08 DIAGNOSIS — I712 Thoracic aortic aneurysm, without rupture, unspecified: Secondary | ICD-10-CM

## 2016-04-16 ENCOUNTER — Other Ambulatory Visit: Payer: Self-pay | Admitting: *Deleted

## 2016-04-16 DIAGNOSIS — I7121 Aneurysm of the ascending aorta, without rupture: Secondary | ICD-10-CM

## 2016-04-16 DIAGNOSIS — I712 Thoracic aortic aneurysm, without rupture: Secondary | ICD-10-CM

## 2016-04-29 ENCOUNTER — Ambulatory Visit (HOSPITAL_COMMUNITY): Payer: Medicare Other

## 2016-05-13 ENCOUNTER — Ambulatory Visit: Payer: Medicare Other | Admitting: Thoracic Surgery (Cardiothoracic Vascular Surgery)

## 2016-05-16 ENCOUNTER — Encounter (HOSPITAL_COMMUNITY): Payer: Self-pay | Admitting: Hematology

## 2016-05-16 ENCOUNTER — Encounter (HOSPITAL_COMMUNITY): Payer: Medicare Other | Attending: Oncology | Admitting: Hematology

## 2016-05-16 ENCOUNTER — Encounter (HOSPITAL_COMMUNITY): Payer: Medicare Other

## 2016-05-16 VITALS — BP 137/79 | HR 74 | Temp 97.9°F | Resp 18 | Wt 193.0 lb

## 2016-05-16 VITALS — Wt 193.9 lb

## 2016-05-16 DIAGNOSIS — F418 Other specified anxiety disorders: Secondary | ICD-10-CM

## 2016-05-16 DIAGNOSIS — Z9889 Other specified postprocedural states: Secondary | ICD-10-CM | POA: Diagnosis not present

## 2016-05-16 DIAGNOSIS — C8330 Diffuse large B-cell lymphoma, unspecified site: Secondary | ICD-10-CM | POA: Diagnosis not present

## 2016-05-16 DIAGNOSIS — Z5189 Encounter for other specified aftercare: Secondary | ICD-10-CM | POA: Diagnosis not present

## 2016-05-16 DIAGNOSIS — Z9221 Personal history of antineoplastic chemotherapy: Secondary | ICD-10-CM | POA: Diagnosis not present

## 2016-05-16 DIAGNOSIS — Z95828 Presence of other vascular implants and grafts: Secondary | ICD-10-CM

## 2016-05-16 LAB — CBC WITH DIFFERENTIAL/PLATELET
BASOS ABS: 0.2 10*3/uL — AB (ref 0.0–0.1)
Basophils Relative: 3 %
Eosinophils Absolute: 0.4 10*3/uL (ref 0.0–0.7)
Eosinophils Relative: 7 %
HEMATOCRIT: 41.3 % (ref 39.0–52.0)
HEMOGLOBIN: 14.9 g/dL (ref 13.0–17.0)
LYMPHS ABS: 2.2 10*3/uL (ref 0.7–4.0)
LYMPHS PCT: 37 %
MCH: 33.1 pg (ref 26.0–34.0)
MCHC: 36.1 g/dL — AB (ref 30.0–36.0)
MCV: 91.8 fL (ref 78.0–100.0)
Monocytes Absolute: 0.5 10*3/uL (ref 0.1–1.0)
Monocytes Relative: 9 %
NEUTROS ABS: 2.6 10*3/uL (ref 1.7–7.7)
NEUTROS PCT: 44 %
Platelets: 194 10*3/uL (ref 150–400)
RBC: 4.5 MIL/uL (ref 4.22–5.81)
RDW: 12.8 % (ref 11.5–15.5)
WBC: 5.8 10*3/uL (ref 4.0–10.5)

## 2016-05-16 LAB — COMPREHENSIVE METABOLIC PANEL
ALK PHOS: 33 U/L — AB (ref 38–126)
ALT: 23 U/L (ref 17–63)
ANION GAP: 9 (ref 5–15)
AST: 24 U/L (ref 15–41)
Albumin: 4.4 g/dL (ref 3.5–5.0)
BILIRUBIN TOTAL: 1 mg/dL (ref 0.3–1.2)
BUN: 15 mg/dL (ref 6–20)
CALCIUM: 9.1 mg/dL (ref 8.9–10.3)
CO2: 22 mmol/L (ref 22–32)
Chloride: 103 mmol/L (ref 101–111)
Creatinine, Ser: 0.81 mg/dL (ref 0.61–1.24)
Glucose, Bld: 101 mg/dL — ABNORMAL HIGH (ref 65–99)
Potassium: 3.6 mmol/L (ref 3.5–5.1)
Sodium: 134 mmol/L — ABNORMAL LOW (ref 135–145)
TOTAL PROTEIN: 7.1 g/dL (ref 6.5–8.1)

## 2016-05-16 LAB — LACTATE DEHYDROGENASE: LDH: 182 U/L (ref 98–192)

## 2016-05-16 MED ORDER — SODIUM CHLORIDE 0.9% FLUSH
10.0000 mL | INTRAVENOUS | Status: DC | PRN
  Administered 2016-05-16: 10 mL via INTRAVENOUS
  Filled 2016-05-16: qty 10

## 2016-05-16 MED ORDER — HEPARIN SOD (PORK) LOCK FLUSH 100 UNIT/ML IV SOLN
500.0000 [IU] | Freq: Once | INTRAVENOUS | Status: AC
  Administered 2016-05-16: 500 [IU] via INTRAVENOUS
  Filled 2016-05-16: qty 5

## 2016-05-16 NOTE — Progress Notes (Signed)
Douglas Bryant presented for Portacath access and flush. Portacath located rt chest wall accessed with  H 20 needle. Good blood return present (slow fill but obtained 30ml) but unable to get blood for labs. Portacath flushed with 54ml NS and 500U/73ml Heparin and needle removed intact. Procedure without incident. Patient tolerated procedure well.  Douglas Bryant presented for labwork. Labs per MD order drawn via Peripheral Line 23 gauge needle inserted in rt ac Good blood return present. Procedure without incident.  Needle removed intact. Patient tolerated procedure well.

## 2016-05-16 NOTE — Progress Notes (Signed)
Douglas Bryant  HEMATOLOGY ONCOLOGY PROGRESS NOTE  Date of service: .05/16/2016  Patient Care Team: [name], MD as PCP - General (Internal Medicine) No Pcp Per Patient (General Practice)  CC: f/u for DLBCL  Diagnosis: DLBCL in complete remission   Current Treatment: Active surveillance   SUMMARY OF ONCOLOGIC HISTORY:   DLBCL (diffuse large B cell lymphoma) ([place])   07/07/2013 Initial Biopsy    CT guided right iliac bone marrow aspiration and core biopsy.      07/07/2013 Pathology Results    Bone Marrow Flow Cytometry - PREDOMINANCE OF T-LYMPHOCYTES WITH NONSPECIFIC REVERSAL OF THE CD4:CD8 RATIO. - NO MONOCLONAL B-CELL POPULATION IDENTIFIED. Bone Marrow, Aspirate,Biopsy, and Clot, right iliac - SLIGHTLY HYPERCELLULAR BONE MARROW FOR AGE WITH TRILINEAGE HEMATOPOIESIS. - NEGATIVE FOR LYMPHOMA - SEE COMMENT. PERIPHERAL BLOOD: - NORMOCYTIC-NORMOCHROMIC ANEMIA. - NEUTROPHILIC LEFT SHIFT.      07/13/2013 - 10/27/2013 Chemotherapy    R-CHOP x 6 cycles by Dr. [name] at [facility]       09/01/2013 PET scan    Minimal metabolic activity associated with the splenic lesion. Otherwise complete response to chemotherapy. No residual hypermetabolic lymph nodes in the skullbase to thigh PET scan. Marked reduction in  lingular consolidation      09/01/2013 PET scan    1. Minimal metabolic activity associated with the splenic lesion. Otherwise complete response to chemotherapy. 1. No residual hypermetabolic lymph nodes in the skullbase to thigh PET scan.  2. Marked reduction in  lingular consolidation      11/28/2013 PET scan    1. Today's study is very similar to prior study from 09/01/2013. Specifically, the ill-defined low-attenuation lesion in the anterior aspect of the spleen is slightly smaller, currently measuring 2.1 x 2.3 cm, and continues to demonstrate some very low-level metabolic activity (SUVmax = 2.9). 2. There is a persistent mass-like opacity in the posterior aspect of the  left upper lobe, which also appears slightly smaller than the prior examination. This too demonstrates some low-level metabolic activity (SUVmax = 2.7). While this may simply represent a resolving post infectious or inflammatory scar, if there is any clinical concern that the opacity on the original study from 06/09/2013 that this was in fact a lymphomatous infiltrate, this could represent a residual focus of disease. 3. No new foci of disease noted in the neck, chest, abdomen or pelvis. 4. Atherosclerosis, including left main and 3 vessel coronary artery disease. Please note that although the presence of coronary artery calcium documents the presence of coronary artery disease, the severity of this disease and any potential stenosis cannot be assessed on this non-gated CT examination. Assessment for potential risk factor modification, dietary therapy or pharmacologic therapy may be warranted, if clinically indicated. 5. Mild cardiomegaly.      07/17/2015 Initial Diagnosis    DLBCL (diffuse large B cell lymphoma) ([place])     10/17/2015 Imaging    CT CAP- Stable exam. No evidence for lymphadenopathy in the chest, abdomen, or pelvis. No new or progressive interval findings. 2. 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       INTERVAL HISTORY:  Patient is here for his scheduled f/u for diffuse large B cell lymphoma. No notes no new enlarged lymph nodes, no constitutional symptoms, no weight loss no night sweats. Notes multiple social and financial stressors. 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 Has good veins and no indication for continuing to keep the port- a-cath in. Has f/u  with Dr Roxan Hockey and needs to have his CTA chest for TAA rescheduled with Dr Hendrickson's office.  REVIEW OF SYSTEMS:    10 Point review of systems of done and is negative except as noted above.  . Past Medical History:  Diagnosis  Date  . Anxiety   . Asthma due to seasonal allergies   . Back pain   . Depression   . DLBCL (diffuse large B cell lymphoma) ([place]) 07/17/2015  . HTN (hypertension)     . Past Surgical History:  Procedure Laterality Date  . HERNIA REPAIR    . RHINOPLASTY      . Social History  Substance Use Topics  . Smoking status: Never Smoker  . Smokeless tobacco: Former Systems developer    Types: Chew    Quit date: 1997  . Alcohol use 3.6 oz/week    6 Cans of beer per week    ALLERGIES:  is allergic to flomax [tamsulosin hcl].  MEDICATIONS:  Current Outpatient Prescriptions  Medication Sig Dispense Refill  . acetaminophen (TYLENOL) 500 MG tablet Take 1,000 mg by mouth every 6 (six) hours as needed for mild pain.    . chlorpheniramine (CHLOR-TRIMETON) 4 MG tablet Take 4 mg by mouth 2 (two) times daily as needed for allergies.    . diphenhydrAMINE (BENADRYL) 25 mg capsule Take 25 mg by mouth every 6 (six) hours as needed for allergies or sleep.    . hydrOXYzine (VISTARIL) 25 MG capsule Take 1 capsule (25 mg total) by mouth at bedtime as needed. 30 capsule 7  . levothyroxine (SYNTHROID, LEVOTHROID) 50 MCG tablet Take 50 mcg by mouth daily before breakfast.     . LORazepam (ATIVAN) 1 MG tablet 1  bid 60 tablet 4  . valACYclovir (VALTREX) 500 MG tablet Take 500 mg by mouth daily as needed (Herpes Virus).    . zolpidem (AMBIEN) 10 MG tablet      No current facility-administered medications for this visit.     PHYSICAL EXAMINATION: ECOG PERFORMANCE STATUS: 1 - Symptomatic but completely ambulatory  . Vitals:   05/16/16 1030  BP: 137/79  Pulse: 74  Resp: 18  Temp: 97.9 F (36.6 C)    Filed Weights   05/16/16 1030  Weight: 193 lb (87.5 kg)   .Body mass index is 25.46 kg/m.  GENERAL:alert, in no acute distress and comfortable SKIN: no acute rashes, no significant lesions EYES: conjunctiva are pink and non-injected, sclera anicteric OROPHARYNX: MMM, no exudates, no oropharyngeal erythema  or ulceration NECK: supple, no JVD LYMPH:  no palpable lymphadenopathy in the cervical, axillary or inguinal regions LUNGS: clear to auscultation b/l with normal respiratory effort HEART: regular rate & rhythm ABDOMEN:  normoactive bowel sounds , non tender, not distended. No palpable hepato-splenomegaly Extremity: no pedal edema PSYCH: alert & oriented x 3 with fluent speech NEURO: no focal motor/sensory deficits  LABORATORY DATA:   I have reviewed the data as listed  . CBC Latest Ref Rng & Units 01/17/2016 10/18/2015 07/07/2013  WBC 4.0 - 10.5 K/uL 5.2 5.3 7.8  Hemoglobin 13.0 - 17.0 g/dL 14.3 13.8 9.1(L)  Hematocrit 39.0 - 52.0 % 40.6 39.2 28.9(L)  Platelets 150 - 400 K/uL 159 172 305    . CMP Latest Ref Rng & Units 01/17/2016 10/18/2015 10/17/2015  Glucose 65 - 99 mg/dL 96 99 -  BUN 6 - 20 mg/dL 13 16 -  Creatinine 0.61 - 1.24 mg/dL 0.87 0.96 0.90  Sodium 135 - 145 mmol/L 136 135 -  Potassium  3.5 - 5.1 mmol/L 3.7 3.7 -  Chloride 101 - 111 mmol/L 104 103 -  CO2 22 - 32 mmol/L 27 25 -  Calcium 8.9 - 10.3 mg/dL 9.0 9.0 -  Total Protein 6.5 - 8.1 g/dL 6.8 6.8 -  Total Bilirubin 0.3 - 1.2 mg/dL 0.7 1.1 -  Alkaline Phos 38 - 126 U/L 33(L) 30(L) -  AST 15 - 41 U/L 28 32 -  ALT 17 - 63 U/L 26 26 -   . Lab Results  Component Value Date   LDH 182 05/16/2016     RADIOGRAPHIC STUDIES: I have personally reviewed the radiological images as listed and agreed with the findings in the report. No results found.  ASSESSMENT & PLAN:   DLBCL (diffuse large B cell lymphoma) (HCC) Stage IV DLBCL, S/P R-CHOP x 6 cycles by Dr. [name] at [facility] with last cycle being administered on 10/27/2013. Patient continues to reman in CR with no clinical evidence of lymphoma progression at this time Labs  Are stable, LDH WNL No palpable LNadenopathy PLAN -no indication for additional w/u or treatment for the patient DLBCL at this time [facility] a cath is not drawing blood and is not need. -- will  setup port-a-cath removal by IR - patient agreeable with this plan. -RTC in 4 months with labs - f/u with Dr Roxan Hockey for TAA and CTA  Depression/Anxiety -continue f/u with psych and continue meds per their recommendations  I spent 20 minutes counseling the patient face to face. The total time spent in the appointment was 25 minutes and more than 50% was on counseling and direct patient cares.    Sullivan Lone MD [place] AAHIVMS [facility] Hematology/Oncology Physician [facility]  (Office):       [phone] (Work cell):  [phone] (Fax):           [phone]

## 2016-05-16 NOTE — Patient Instructions (Signed)
[place] at [facility] Discharge Instructions  RECOMMENDATIONS MADE BY THE CONSULTANT AND ANY TEST RESULTS WILL BE SENT TO YOUR REFERRING PHYSICIAN.  You were seen today by Dr. Irene Limbo We will schedule to have your Port removed Follow up with Dr. Leonarda Salon office for CT scans of chest Follow up in 4 months with lab work See Amy up front for appointments    Thank you for choosing [place] at [facility] to provide your oncology and hematology care.  To afford each patient quality time with our provider, please arrive at least 15 minutes before your scheduled appointment time.    If you have a lab appointment with the [place] please come in thru the  Main Entrance and check in at the main information desk  You need to re-schedule your appointment should you arrive 10 or more minutes late.  We strive to give you quality time with our providers, and arriving late affects you and other patients whose appointments are after yours.  Also, if you no show three or more times for appointments you may be dismissed from the clinic at the providers discretion.     Again, thank you for choosing [facility].  Our hope is that these requests will decrease the amount of time that you wait before being seen by our physicians.       _____________________________________________________________  Should you have questions after your visit to [facility], please contact our office at [phone] between the hours of 8:30 a.m. and 4:30 p.m.  Voicemails left after 4:30 p.m. will not be returned until the following business day.  For prescription refill requests, have your pharmacy contact our office.       Resources For Cancer Patients and their Caregivers ? American Cancer Society: Can assist with transportation, wigs, general needs, runs Look Good Feel Better.        608-599-9048 ? Cancer Care: Provides financial  assistance, online support groups, medication/co-pay assistance.  1-800-813-HOPE (484)415-8985) ? [place] Assists [place] cancer patients and their families through emotional , educational and financial support.  (513)644-3986 ? [place] DSS Where to apply for food stamps, Medicaid and utility assistance. (223)689-3247 ? RCATS: Transportation to medical appointments. 603 501 1159 ? Social Security Administration: May apply for disability if have a Stage IV cancer. 509-567-3174 458 238 9126 ? [employer], Disability and Transit Services: Assists with nutrition, care and transit needs. [place] Support Programs: @10RELATIVEDAYS @ > Cancer Support Group  2nd Tuesday of the month 1pm-2pm, Journey Room  > Creative Journey  3rd Tuesday of the month 1130am-1pm, Journey Room  > Look Good Feel Better  1st Wednesday of the month 10am-12 noon, Journey Room (Call [place] to register [phone])

## 2016-05-30 ENCOUNTER — Ambulatory Visit (HOSPITAL_COMMUNITY): Payer: Medicare Other

## 2016-05-30 ENCOUNTER — Other Ambulatory Visit (HOSPITAL_COMMUNITY): Payer: Self-pay

## 2016-06-02 ENCOUNTER — Other Ambulatory Visit (HOSPITAL_COMMUNITY): Payer: Self-pay

## 2016-06-02 NOTE — Telephone Encounter (Signed)
Medication management - Pt called back and left a message verifying he had filled his Lorazepam several times early due to "extreme stress" and would need a new order prior to appt. 07/23/16. Requested Dr. Casimiro Needle refill again or to get him in earlier if needed.

## 2016-06-02 NOTE — Telephone Encounter (Signed)
Medication management - Message left for pt after he left on that he would need a new Lorazepam order prior to appt. 07/23/16. Informed last order 02/27/16 + 4 refills and requested call back if would be out before appt as appears he should have enough until seen.  Patient to call back if order needed sooner.

## 2016-06-03 NOTE — Telephone Encounter (Signed)
I called and spoke to the pharmacy. Patient still has 3 refills left on the January prescription. He last filled on 4/16 and can get another refill on 5/15. Patient has filled early the last 3 months, I requested they put a note in his chart to fill at 30 days no early fills. I called patient and lvm for him with my call back number.

## 2016-06-04 ENCOUNTER — Telehealth (HOSPITAL_COMMUNITY): Payer: Self-pay | Admitting: *Deleted

## 2016-07-01 ENCOUNTER — Ambulatory Visit: Payer: Self-pay | Admitting: Thoracic Surgery (Cardiothoracic Vascular Surgery)

## 2016-07-02 ENCOUNTER — Other Ambulatory Visit (HOSPITAL_COMMUNITY): Payer: Self-pay

## 2016-07-02 ENCOUNTER — Ambulatory Visit (HOSPITAL_COMMUNITY): Payer: Medicare Other

## 2016-07-06 ENCOUNTER — Other Ambulatory Visit: Payer: Self-pay | Admitting: Radiology

## 2016-07-07 ENCOUNTER — Other Ambulatory Visit: Payer: Self-pay | Admitting: General Surgery

## 2016-07-08 ENCOUNTER — Encounter (HOSPITAL_COMMUNITY): Payer: Self-pay

## 2016-07-08 ENCOUNTER — Ambulatory Visit (HOSPITAL_COMMUNITY)
Admission: RE | Admit: 2016-07-08 | Discharge: 2016-07-08 | Disposition: A | Discharge status: Home / Self Care | Payer: Medicare Other | Source: Ambulatory Visit | Attending: Hematology | Admitting: Hematology

## 2016-07-08 DIAGNOSIS — F419 Anxiety disorder, unspecified: Secondary | ICD-10-CM | POA: Diagnosis not present

## 2016-07-08 DIAGNOSIS — Z9221 Personal history of antineoplastic chemotherapy: Secondary | ICD-10-CM | POA: Diagnosis not present

## 2016-07-08 DIAGNOSIS — Z79899 Other long term (current) drug therapy: Secondary | ICD-10-CM | POA: Diagnosis not present

## 2016-07-08 DIAGNOSIS — Z5111 Encounter for antineoplastic chemotherapy: Secondary | ICD-10-CM | POA: Diagnosis not present

## 2016-07-08 DIAGNOSIS — Z8572 Personal history of non-Hodgkin lymphomas: Secondary | ICD-10-CM | POA: Diagnosis not present

## 2016-07-08 DIAGNOSIS — C8330 Diffuse large B-cell lymphoma, unspecified site: Secondary | ICD-10-CM | POA: Insufficient documentation

## 2016-07-08 DIAGNOSIS — Z452 Encounter for adjustment and management of vascular access device: Secondary | ICD-10-CM | POA: Insufficient documentation

## 2016-07-08 DIAGNOSIS — Z888 Allergy status to other drugs, medicaments and biological substances status: Secondary | ICD-10-CM | POA: Diagnosis not present

## 2016-07-08 DIAGNOSIS — I10 Essential (primary) hypertension: Secondary | ICD-10-CM | POA: Diagnosis not present

## 2016-07-08 DIAGNOSIS — F329 Major depressive disorder, single episode, unspecified: Secondary | ICD-10-CM | POA: Insufficient documentation

## 2016-07-08 HISTORY — DX: Personal history of antineoplastic chemotherapy: Z92.21

## 2016-07-08 HISTORY — DX: Personal history of other medical treatment: Z92.89

## 2016-07-08 HISTORY — DX: Thoracic aortic aneurysm, without rupture, unspecified: I71.20

## 2016-07-08 HISTORY — DX: Personal history of other diseases of the respiratory system: Z87.09

## 2016-07-08 HISTORY — DX: Nocturia: R35.1

## 2016-07-08 HISTORY — DX: Hypothyroidism, unspecified: E03.9

## 2016-07-08 HISTORY — DX: Peripheral vascular disease, unspecified: I73.9

## 2016-07-08 HISTORY — DX: Anemia, unspecified: D64.9

## 2016-07-08 HISTORY — DX: Frequency of micturition: R35.0

## 2016-07-08 HISTORY — PX: IR REMOVAL TUN ACCESS W/ PORT W/O FL MOD SED: IMG2290

## 2016-07-08 HISTORY — DX: Other allergy status, other than to drugs and biological substances: Z91.09

## 2016-07-08 HISTORY — DX: Thoracic aortic aneurysm, without rupture: I71.2

## 2016-07-08 LAB — CBC WITH DIFFERENTIAL/PLATELET
BASOS PCT: 2 %
Basophils Absolute: 0.1 10*3/uL (ref 0.0–0.1)
EOS ABS: 0.5 10*3/uL (ref 0.0–0.7)
Eosinophils Relative: 9 %
HCT: 40.8 % (ref 39.0–52.0)
Hemoglobin: 14.3 g/dL (ref 13.0–17.0)
LYMPHS ABS: 2.2 10*3/uL (ref 0.7–4.0)
Lymphocytes Relative: 36 %
MCH: 32.5 pg (ref 26.0–34.0)
MCHC: 35 g/dL (ref 30.0–36.0)
MCV: 92.7 fL (ref 78.0–100.0)
MONOS PCT: 8 %
Monocytes Absolute: 0.5 10*3/uL (ref 0.1–1.0)
Neutro Abs: 2.8 10*3/uL (ref 1.7–7.7)
Neutrophils Relative %: 45 %
Platelets: 172 10*3/uL (ref 150–400)
RBC: 4.4 MIL/uL (ref 4.22–5.81)
RDW: 13 % (ref 11.5–15.5)
WBC: 6.1 10*3/uL (ref 4.0–10.5)

## 2016-07-08 LAB — PROTIME-INR
INR: 0.91
PROTHROMBIN TIME: 12.3 s (ref 11.4–15.2)

## 2016-07-08 LAB — BASIC METABOLIC PANEL
Anion gap: 7 (ref 5–15)
BUN: 13 mg/dL (ref 6–20)
CALCIUM: 8.9 mg/dL (ref 8.9–10.3)
CHLORIDE: 105 mmol/L (ref 101–111)
CO2: 26 mmol/L (ref 22–32)
CREATININE: 0.86 mg/dL (ref 0.61–1.24)
GFR calc Af Amer: >60 mL/min (ref >60)
GFR calc non Af Amer: >60 mL/min (ref >60)
GLUCOSE: 107 mg/dL — AB (ref 65–99)
Potassium: 4.1 mmol/L (ref 3.5–5.1)
Sodium: 138 mmol/L (ref 135–145)

## 2016-07-08 MED ORDER — MIDAZOLAM HCL 2 MG/2ML IJ SOLN
INTRAMUSCULAR | Status: AC
  Filled 2016-07-08: qty 6

## 2016-07-08 MED ORDER — LIDOCAINE-EPINEPHRINE (PF) 2 %-1:200000 IJ SOLN
INTRAMUSCULAR | Status: AC
  Filled 2016-07-08: qty 20

## 2016-07-08 MED ORDER — MIDAZOLAM HCL 2 MG/2ML IJ SOLN
INTRAMUSCULAR | Status: AC | PRN
  Administered 2016-07-08 (×2): 1 mg via INTRAVENOUS

## 2016-07-08 MED ORDER — CEFAZOLIN SODIUM-DEXTROSE 2-4 GM/100ML-% IV SOLN
INTRAVENOUS | Status: AC
  Filled 2016-07-08: qty 100

## 2016-07-08 MED ORDER — FENTANYL CITRATE (PF) 100 MCG/2ML IJ SOLN
INTRAMUSCULAR | Status: AC
  Filled 2016-07-08: qty 2

## 2016-07-08 MED ORDER — FENTANYL CITRATE (PF) 100 MCG/2ML IJ SOLN
INTRAMUSCULAR | Status: AC | PRN
  Administered 2016-07-08 (×2): 50 ug via INTRAVENOUS

## 2016-07-08 MED ORDER — CEFAZOLIN SODIUM-DEXTROSE 2-4 GM/100ML-% IV SOLN
2.0000 g | INTRAVENOUS | Status: AC
  Administered 2016-07-08: 2 g via INTRAVENOUS
  Filled 2016-07-08: qty 100

## 2016-07-08 MED ORDER — SODIUM CHLORIDE 0.9 % IV SOLN
INTRAVENOUS | Status: DC
  Administered 2016-07-08: 12:00:00 via INTRAVENOUS

## 2016-07-08 NOTE — H&P (Signed)
Referring Physician(s): Brunetta Genera  Supervising Physician: Sandi Mariscal  Patient Status:  [facility]  Chief Complaint: "I'm getting my port out"   Subjective: Patient familiar to IR service from prior right chest wall Port-A-Cath and bone marrow biopsy in 2015. He has a history of diffuse large B-cell lymphoma and is status post chemotherapy. He is currently in clinical remission. He presents today for Port-A-Cath removal. He currently denies fever, headache, chest pain, dyspnea, cough, abdominal/back pain, nausea, vomiting or abnormal bleeding. Past Medical History:  Diagnosis Date  . Anxiety   . Asthma due to seasonal allergies   . Back pain   . Depression   . DLBCL (diffuse large B cell lymphoma) ([place]) 07/17/2015  . HTN (hypertension)    Past Surgical History:  Procedure Laterality Date  . HERNIA REPAIR    . RHINOPLASTY        Allergies: Flomax [tamsulosin hcl]  Medications: Prior to Admission medications   Medication Sig Start Date End Date Taking? Authorizing Provider  acetaminophen (TYLENOL) 500 MG tablet Take 1,000 mg by mouth every 6 (six) hours as needed for mild pain.    [provider]  chlorpheniramine (CHLOR-TRIMETON) 4 MG tablet Take 4 mg by mouth 2 (two) times daily as needed for allergies.    [provider]  diphenhydrAMINE (BENADRYL) 25 mg capsule Take 25 mg by mouth every 6 (six) hours as needed for allergies or sleep.    [provider]  hydrOXYzine (VISTARIL) 25 MG capsule Take 1 capsule (25 mg total) by mouth at bedtime as needed. 02/27/16   Plovsky, Berneta Sages, MD  levothyroxine (SYNTHROID, LEVOTHROID) 50 MCG tablet Take 50 mcg by mouth daily before breakfast.  10/30/15   [provider]  LORazepam (ATIVAN) 1 MG tablet 1  bid 02/27/16   Plovsky, Berneta Sages, MD  valACYclovir (VALTREX) 500 MG tablet Take 500 mg by mouth daily as needed (Herpes Virus).    [provider]  zolpidem (AMBIEN) 10 MG tablet  05/02/16    [provider]     Vital Signs: BP (!) 155/101 (BP Location: Left Arm)   Pulse 79   Temp 98.2 F (36.8 C) (Oral)   Resp 18   SpO2 95%   Physical Exam awake, alert. Chest clear to auscultation bilaterally. Clean, intact right chest wall Port-A-Cath. Heart with regular rate and rhythm. Abdomen soft, positive bowel sounds, nontender. Lower extremities with no edema.  Imaging: No results found.  Labs:  CBC:  Recent Labs  10/18/15 1430 01/17/16 1150 05/16/16 1030  WBC 5.3 5.2 5.8  HGB 13.8 14.3 14.9  HCT 39.2 40.6 41.3  PLT 172 159 194    COAGS: No results for input(s): INR, APTT in the last 8760 hours.  BMP:  Recent Labs  10/17/15 1136 10/18/15 1430 01/17/16 1150 05/16/16 1030  NA  --  135 136 134*  K  --  3.7 3.7 3.6  CL  --  103 104 103  CO2  --  _0 GLUCOSE  --  99 96 101*  BUN  --  _1 CALCIUM  --  9.0 9.0 9.1  CREATININE 0.90 0.96 0.87 0.81  GFRNONAA  --  >60 >60 >60  GFRAA  --  >60 >60 >60    LIVER FUNCTION TESTS:  Recent Labs  10/18/15 1430 01/17/16 1150 05/16/16 1030  BILITOT 1.1 0.7 1.0  AST 32 28 24  ALT _2 ALKPHOS 30* 33* 33*  PROT 6.8 6.8 7.1  ALBUMIN 4.5 4.1 4.4    Assessment and Plan:  Pt with history of diffuse large B-cell lymphoma, initially diagnosed in 2015 ; status post chemotherapy. He is currently in clinical remission. He presents today for Port-A-Cath removal. Details/risks of procedure, including but not limited to, internal bleeding, infection, injury to adjacent structures, discussed with patient with his understanding and consent. Labs pending.    Electronically Signed: D. Rowe Robert, PA-C 07/08/2016, 11:48 AM   I spent a total of 20 minutes at the the patient's bedside AND on the patient's hospital floor or unit, greater than 50% of which was counseling/coordinating care for Port-A-Cath removal

## 2016-07-08 NOTE — Sedation Documentation (Signed)
Patient is resting comfortably. 

## 2016-07-08 NOTE — Procedures (Signed)
Successful removal of right anterior chest wall port-a-cath. EBL: Minimal No immediate post procedural complications.   Ronny Bacon, MD Pager #: [phone]

## 2016-07-08 NOTE — Discharge Instructions (Signed)
Moderate Conscious Sedation, Adult, Care After These instructions provide you with information about caring for yourself after your procedure. Your health care provider may also give you more specific instructions. Your treatment has been planned according to current medical practices, but problems sometimes occur. Call your health care provider if you have any problems or questions after your procedure. What can I expect after the procedure? After your procedure, it is common:  To feel sleepy for several hours.  To feel clumsy and have poor balance for several hours.  To have poor judgment for several hours.  To vomit if you eat too soon.  Follow these instructions at home: For at least 24 hours after the procedure:   Do not: ? Participate in activities where you could fall or become injured. ? Drive. ? Use heavy machinery. ? Drink alcohol. ? Take sleeping pills or medicines that cause drowsiness. ? Make important decisions or sign legal documents. ? Take care of children on your own.  Rest. Eating and drinking  Follow the diet recommended by your health care provider.  If you vomit: ? Drink water, juice, or soup when you can drink without vomiting. ? Make sure you have little or no nausea before eating solid foods. General instructions  Have a responsible adult stay with you until you are awake and alert.  Take over-the-counter and prescription medicines only as told by your health care provider.  If you smoke, do not smoke without supervision.  Keep all follow-up visits as told by your health care provider. This is important. Contact a health care provider if:  You keep feeling nauseous or you keep vomiting.  You feel light-headed.  You develop a rash.  You have a fever. Get help right away if:  You have trouble breathing. This information is not intended to replace advice given to you by your health care provider. Make sure you discuss any questions you have  with your health care provider. Document Released: 11/03/2012 Document Revised: 06/18/2015 Document Reviewed: 05/05/2015 Elsevier Interactive Patient Education  2018 [place] Removal Implanted port removal is a procedure to remove the port and catheter (port-a-cath) that is implanted under your skin. The port is a small disc under your skin that can be punctured with a needle. It is connected to a vein in your chest or neck by a small flexible tube (catheter). The port-a-cath is used for treatment through an IV tube and for taking blood samples. Your health care provider will remove the port-a-cath if:  You no longer need it for treatment.  It is not working properly.  The area around it gets infected.  Tell a health care provider about:  Any allergies you have.  All medicines you are taking, including vitamins, herbs, eye drops, creams, and over-the-counter medicines.  Any problems you or family members have had with anesthetic medicines.  Any blood disorders you have.  Any surgeries you have had.  Any medical conditions you have.  Whether you are pregnant or may be pregnant. What are the risks? Generally, this is a safe procedure. However, problems may occur, including:  Infection.  Bleeding.  Allergic reactions to anesthetic medicines.  Damage to nerves or blood vessels.  What happens before the procedure?  You will have: ? A physical exam. ? Blood tests. ? Imaging tests, including a chest X-ray.  Follow instructions from your health care provider about eating or drinking restrictions.  Ask your health care provider about: ? Changing or stopping your  regular medicines. This is especially important if you are taking diabetes medicines or blood thinners. ? Taking medicines such as aspirin and ibuprofen. These medicines can thin your blood. Do not take these medicines before your procedure if your surgeon instructs you not to.  Ask your health  care provider how your surgical site will be marked or identified.  You may be given antibiotic medicine to help prevent infection.  Plan to have someone take you home after the procedure.  If you will be going home right after the procedure, plan to have someone stay with you for 24 hours. What happens during the procedure?  To reduce your risk of infection: ? Your health care team will wash or sanitize their hands. ? Your skin will be washed with soap.  You may be given one or more of the following: ? A medicine to help you relax (sedative). ? A medicine to numb the area (local anesthetic).  A small cut (incision) will be made at the site of your port-a-cath.  The port-a-cath and the catheter that has been inside your vein will gently be removed.  The incision will be closed with stitches (sutures), adhesive strips, or skin glue.  A bandage (dressing) will be placed over the incision. The procedure may vary among health care providers and hospitals. What happens after the procedure?  Your blood pressure, heart rate, breathing rate, and blood oxygen level will be monitored often until the medicines you were given have worn off.  Do not drive for 24 hours if you received a sedative. This information is not intended to replace advice given to you by your health care provider. Make sure you discuss any questions you have with your health care provider. Document Released: 12/25/2014 Document Revised: 06/21/2015 Document Reviewed: 10/18/2014 Elsevier Interactive Patient Education  2018 [place] Removal, Care After Refer to this sheet in the next few weeks. These instructions provide you with information about caring for yourself after your procedure. Your health care provider may also give you more specific instructions. Your treatment has been planned according to current medical practices, but problems sometimes occur. Call your health care provider if you have  any problems or questions after your procedure. What can I expect after the procedure? After the procedure, it is common to have:  Soreness or pain near your incision.  Some swelling or bruising near your incision.  Follow these instructions at home: Medicines  Take over-the-counter and prescription medicines only as told by your health care provider.  If you were prescribed an antibiotic medicine, take it as told by your health care provider. Do not stop taking the antibiotic even if you start to feel better. Bathing  Do not take baths, swim, or use a hot tub until your health care provider approves. Ask your health care provider if you can take showers. You may only be allowed to take sponge baths for bathing. Incision care  Follow instructions from your health care provider about how to take care of your incision. Make sure you: ? Wash your hands with soap and water before you change your bandage (dressing). If soap and water are not available, use hand sanitizer. ? Change your dressing as told by your health care provider. ? Keep your dressing dry. ? Leave stitches (sutures), skin glue, or adhesive strips in place. These skin closures may need to stay in place for 2 weeks or longer. If adhesive strip edges start to loosen and curl up, you may  trim the loose edges. Do not remove adhesive strips completely unless your health care provider tells you to do that.  Check your incision area every day for signs of infection. Check for: ? More redness, swelling, or pain. ? More fluid or blood. ? Warmth. ? Pus or a bad smell. Driving  If you received a sedative, do not drive for 24 hours after the procedure.  If you did not receive a sedative, ask your health care provider when it is safe to drive. Activity  Return to your normal activities as told by your health care provider. Ask your health care provider what activities are safe for you.  Until your health care provider says it is  safe: ? Do not lift anything that is heavier than 10 lb (4.5 kg). ? Do not do activities that involve lifting your arms over your head. General instructions  Do not use any tobacco products, such as cigarettes, chewing tobacco, and e-cigarettes. Tobacco can delay healing. If you need help quitting, ask your health care provider.  Keep all follow-up visits as told by your health care provider. This is important. Contact a health care provider if:  You have more redness, swelling, or pain around your incision.  You have more fluid or blood coming from your incision.  Your incision feels warm to the touch.  You have pus or a bad smell coming from your incision.  You have a fever.  You have pain that is not relieved by your pain medicine. Get help right away if:  You have chest pain.  You have difficulty breathing. This information is not intended to replace advice given to you by your health care provider. Make sure you discuss any questions you have with your health care provider. Document Released: 12/25/2014 Document Revised: 06/21/2015 Document Reviewed: 10/18/2014 Elsevier Interactive Patient Education  [employer].

## 2016-07-14 DIAGNOSIS — J069 Acute upper respiratory infection, unspecified: Secondary | ICD-10-CM | POA: Diagnosis not present

## 2016-07-14 DIAGNOSIS — I711 Thoracic aortic aneurysm, ruptured: Secondary | ICD-10-CM | POA: Diagnosis not present

## 2016-07-14 DIAGNOSIS — Z6825 Body mass index (BMI) 25.0-25.9, adult: Secondary | ICD-10-CM | POA: Diagnosis not present

## 2016-07-14 DIAGNOSIS — Z299 Encounter for prophylactic measures, unspecified: Secondary | ICD-10-CM | POA: Diagnosis not present

## 2016-07-14 DIAGNOSIS — E7800 Pure hypercholesterolemia, unspecified: Secondary | ICD-10-CM | POA: Diagnosis not present

## 2016-07-14 DIAGNOSIS — Z789 Other specified health status: Secondary | ICD-10-CM | POA: Diagnosis not present

## 2016-07-14 DIAGNOSIS — C8590 Non-Hodgkin lymphoma, unspecified, unspecified site: Secondary | ICD-10-CM | POA: Diagnosis not present

## 2016-07-23 ENCOUNTER — Ambulatory Visit (HOSPITAL_COMMUNITY): Payer: Self-pay | Admitting: Psychiatry

## 2016-07-25 ENCOUNTER — Ambulatory Visit (INDEPENDENT_AMBULATORY_CARE_PROVIDER_SITE_OTHER): Payer: Medicare Other | Admitting: Psychiatry

## 2016-07-25 DIAGNOSIS — F341 Dysthymic disorder: Secondary | ICD-10-CM

## 2016-07-25 DIAGNOSIS — Z818 Family history of other mental and behavioral disorders: Secondary | ICD-10-CM

## 2016-07-25 DIAGNOSIS — Z811 Family history of alcohol abuse and dependence: Secondary | ICD-10-CM | POA: Diagnosis not present

## 2016-07-25 MED ORDER — CLONAZEPAM 1 MG PO TABS: ORAL_TABLET | ORAL | 3 refills | Status: DC

## 2016-07-25 MED ORDER — HYDROXYZINE PAMOATE 25 MG PO CAPS: 25.0000 mg | ORAL_CAPSULE | Freq: Every evening | ORAL | 7 refills | Status: DC | PRN

## 2016-07-25 MED ORDER — ZOLPIDEM TARTRATE 10 MG PO TABS: 10.0000 mg | ORAL_TABLET | Freq: Every day | ORAL | 4 refills | Status: DC
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
## 2016-08-06 ENCOUNTER — Ambulatory Visit (HOSPITAL_COMMUNITY): Payer: Self-pay | Admitting: Psychiatry

## 2016-08-08 DIAGNOSIS — L538 Other specified erythematous conditions: Secondary | ICD-10-CM | POA: Diagnosis not present

## 2016-08-08 DIAGNOSIS — L814 Other melanin hyperpigmentation: Secondary | ICD-10-CM | POA: Diagnosis not present

## 2016-08-08 DIAGNOSIS — L821 Other seborrheic keratosis: Secondary | ICD-10-CM | POA: Diagnosis not present

## 2016-08-08 DIAGNOSIS — L820 Inflamed seborrheic keratosis: Secondary | ICD-10-CM | POA: Diagnosis not present

## 2016-08-08 DIAGNOSIS — L720 Epidermal cyst: Secondary | ICD-10-CM | POA: Diagnosis not present

## 2016-08-18 ENCOUNTER — Ambulatory Visit (INDEPENDENT_AMBULATORY_CARE_PROVIDER_SITE_OTHER): Payer: Medicare Other | Admitting: Licensed Clinical Social Worker

## 2016-08-18 ENCOUNTER — Encounter (HOSPITAL_COMMUNITY): Payer: Self-pay | Admitting: Licensed Clinical Social Worker

## 2016-08-18 DIAGNOSIS — F4322 Adjustment disorder with anxiety: Secondary | ICD-10-CM | POA: Diagnosis not present

## 2016-08-18 DIAGNOSIS — F341 Dysthymic disorder: Secondary | ICD-10-CM
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
## 2016-08-29 DIAGNOSIS — N400 Enlarged prostate without lower urinary tract symptoms: Secondary | ICD-10-CM | POA: Diagnosis not present

## 2016-08-29 DIAGNOSIS — Z299 Encounter for prophylactic measures, unspecified: Secondary | ICD-10-CM | POA: Diagnosis not present

## 2016-08-29 DIAGNOSIS — G4700 Insomnia, unspecified: Secondary | ICD-10-CM | POA: Diagnosis not present

## 2016-08-29 DIAGNOSIS — C8590 Non-Hodgkin lymphoma, unspecified, unspecified site: Secondary | ICD-10-CM | POA: Diagnosis not present

## 2016-08-29 DIAGNOSIS — Z79899 Other long term (current) drug therapy: Secondary | ICD-10-CM | POA: Diagnosis not present

## 2016-08-29 DIAGNOSIS — M549 Dorsalgia, unspecified: Secondary | ICD-10-CM | POA: Diagnosis not present

## 2016-08-29 DIAGNOSIS — Z6826 Body mass index (BMI) 26.0-26.9, adult: Secondary | ICD-10-CM | POA: Diagnosis not present

## 2016-08-29 DIAGNOSIS — E039 Hypothyroidism, unspecified: Secondary | ICD-10-CM | POA: Diagnosis not present

## 2016-08-29 DIAGNOSIS — I711 Thoracic aortic aneurysm, ruptured: Secondary | ICD-10-CM | POA: Diagnosis not present

## 2016-08-29 DIAGNOSIS — E7800 Pure hypercholesterolemia, unspecified: Secondary | ICD-10-CM | POA: Diagnosis not present

## 2016-09-15 ENCOUNTER — Encounter (HOSPITAL_COMMUNITY): Payer: Self-pay

## 2016-09-15 ENCOUNTER — Encounter (HOSPITAL_COMMUNITY): Payer: Medicare Other | Attending: Oncology

## 2016-09-15 ENCOUNTER — Encounter (HOSPITAL_COMMUNITY): Payer: Medicare Other | Attending: Oncology | Admitting: Oncology

## 2016-09-15 VITALS — BP 143/88 | HR 70 | Resp 16 | Ht 73.0 in | Wt 193.0 lb

## 2016-09-15 DIAGNOSIS — F418 Other specified anxiety disorders: Secondary | ICD-10-CM

## 2016-09-15 DIAGNOSIS — C8330 Diffuse large B-cell lymphoma, unspecified site: Secondary | ICD-10-CM

## 2016-09-15 LAB — CBC WITH DIFFERENTIAL/PLATELET
BASOS ABS: 0.1 10*3/uL (ref 0.0–0.1)
Basophils Relative: 2 %
EOS PCT: 9 %
Eosinophils Absolute: 0.6 10*3/uL (ref 0.0–0.7)
HCT: 42.6 % (ref 39.0–52.0)
Hemoglobin: 15 g/dL (ref 13.0–17.0)
LYMPHS PCT: 37 %
Lymphs Abs: 2.3 10*3/uL (ref 0.7–4.0)
MCH: 33.3 pg (ref 26.0–34.0)
MCHC: 35.2 g/dL (ref 30.0–36.0)
MCV: 94.5 fL (ref 78.0–100.0)
MONO ABS: 0.5 10*3/uL (ref 0.1–1.0)
Monocytes Relative: 9 %
Neutro Abs: 2.7 10*3/uL (ref 1.7–7.7)
Neutrophils Relative %: 43 %
PLATELETS: 185 10*3/uL (ref 150–400)
RBC: 4.51 MIL/uL (ref 4.22–5.81)
RDW: 12.9 % (ref 11.5–15.5)
WBC: 6.2 10*3/uL (ref 4.0–10.5)

## 2016-09-15 LAB — COMPREHENSIVE METABOLIC PANEL
ALK PHOS: 37 U/L — AB (ref 38–126)
ALT: 25 U/L (ref 17–63)
AST: 30 U/L (ref 15–41)
Albumin: 4.3 g/dL (ref 3.5–5.0)
Anion gap: 8 (ref 5–15)
BILIRUBIN TOTAL: 0.9 mg/dL (ref 0.3–1.2)
BUN: 16 mg/dL (ref 6–20)
CHLORIDE: 101 mmol/L (ref 101–111)
CO2: 27 mmol/L (ref 22–32)
CREATININE: 0.94 mg/dL (ref 0.61–1.24)
Calcium: 9 mg/dL (ref 8.9–10.3)
GFR calc Af Amer: >60 mL/min (ref >60)
Glucose, Bld: 102 mg/dL — ABNORMAL HIGH (ref 65–99)
Potassium: 3.9 mmol/L (ref 3.5–5.1)
Sodium: 136 mmol/L (ref 135–145)
Total Protein: 7.2 g/dL (ref 6.5–8.1)

## 2016-09-15 LAB — LACTATE DEHYDROGENASE: LDH: 220 U/L — AB (ref 98–192)

## 2016-09-15 NOTE — Progress Notes (Signed)
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 [name] at [facility]    DLBCL (diffuse large B cell lymphoma) ([place])   07/07/2013 Initial Biopsy    CT guided right iliac bone marrow aspiration and core biopsy.      07/07/2013 Pathology Results    Bone Marrow Flow Cytometry - PREDOMINANCE OF T-LYMPHOCYTES WITH NONSPECIFIC REVERSAL OF THE CD4:CD8 RATIO. - NO MONOCLONAL B-CELL POPULATION IDENTIFIED. Bone Marrow, Aspirate,Biopsy, and Clot, right iliac - SLIGHTLY HYPERCELLULAR BONE MARROW FOR AGE WITH TRILINEAGE HEMATOPOIESIS. - NEGATIVE FOR LYMPHOMA - SEE COMMENT. PERIPHERAL BLOOD: - NORMOCYTIC-NORMOCHROMIC ANEMIA. - NEUTROPHILIC LEFT SHIFT.      07/13/2013 - 10/27/2013 Chemotherapy    R-CHOP x 6 cycles by Dr. [name] at [facility]       09/01/2013 PET scan    Minimal metabolic activity associated with the splenic lesion. Otherwise complete response to chemotherapy. No residual hypermetabolic lymph nodes in the skullbase to thigh PET scan. Marked reduction in  lingular consolidation      09/01/2013 PET scan    1. Minimal metabolic activity associated with the splenic lesion. Otherwise complete response to chemotherapy. 1. No residual hypermetabolic lymph nodes in the skullbase to thigh PET scan.  2. Marked reduction in  lingular consolidation      11/28/2013 PET scan    1. Today's study is very similar to prior study from 09/01/2013. Specifically, the ill-defined low-attenuation lesion in the anterior aspect of the spleen is slightly smaller, currently measuring 2.1 x 2.3 cm, and continues to demonstrate some very low-level metabolic activity (SUVmax = 2.9). 2. There is a persistent mass-like opacity in the posterior aspect of the left upper lobe, which also appears slightly  smaller than the prior examination. This too demonstrates some low-level metabolic activity (SUVmax = 2.7). While this may simply represent a resolving post infectious or inflammatory scar, if there is any clinical concern that the opacity on the original study from 06/09/2013 that this was in fact a lymphomatous infiltrate, this could represent a residual focus of disease. 3. No new foci of disease noted in the neck, chest, abdomen or pelvis. 4. Atherosclerosis, including left main and 3 vessel coronary artery disease. Please note that although the presence of coronary artery calcium documents the presence of coronary artery disease, the severity of this disease and any potential stenosis cannot be assessed on this non-gated CT examination. Assessment for potential risk factor modification, dietary therapy or pharmacologic therapy may be warranted, if clinically indicated. 5. Mild cardiomegaly.      07/17/2015 Initial Diagnosis    DLBCL (diffuse large B cell lymphoma) ([place])     10/17/2015 Imaging    CT CAP- Stable exam. No evidence for lymphadenopathy in the chest, abdomen, or pelvis. No new or progressive interval findings. 2. 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        HISTORY OF PRESENTING ILLNESS:  Douglas Bryant 66 y.o. male is here to establish ongoing care for of a history of DLBCL, stage IV. He was previously followed at [facility] by Dr. [name].  Mr. Brandow is a pleasant 66 y.o. man who presents with non-Hodgkin's lymphoma, diffuse large B-cell type, clinical stage IV who finished 6 cycles of chemotherapy RCHOP in October 2015.   He states that he has been doing well  since his last visit. He denies any drenching night sweats except for when they turn on the air-conditioning to high images under a blanket and he sweats. Appetite is good. No weight loss. No severe fatigue. Patient states that he struggles with anxiety/depression and has been  having a lot of stresses at home with his 15 year old son who is living with him and getting him into debt. He denies any chest pain, shortness breath, abdominal pain, recent infections, focal weakness.  MEDICAL HISTORY:  Past Medical History:  Diagnosis Date  . Anemia   . Aneurysm, aorta, thoracic ([place])   . Anxiety   . Back pain   . Depression   . DLBCL (diffuse large B cell lymphoma) ([place]) 07/17/2015  . Environmental allergies   . History of blood transfusion   . History of bronchitis   . History of chemotherapy   . Hypothyroidism   . Nocturia   . Peripheral vascular disease ([place])   . Urinary frequency     SURGICAL HISTORY: Past Surgical History:  Procedure Laterality Date  . BONE MARROW BIOPSY    . HERNIA REPAIR     umblical times 2; inguinal (left)   . IR REMOVAL TUN ACCESS W/ PORT W/O FL MOD SED  07/08/2016  . port a cath placement    . RHINOPLASTY      SOCIAL HISTORY: Social History   Social History  . Marital status: Divorced    Spouse name: N/A  . Number of children: N/A  . Years of education: N/A   Occupational History  . Not on file.   Social History Main Topics  . Smoking status: Never Smoker  . Smokeless tobacco: Never Used  . Alcohol use 3.6 oz/week    6 Cans of beer per week     Comment: beer occas  . Drug use: No     Comment: smokes daily for thirty years. currently takes one puff  . Sexual activity: Yes    Partners: Female    Birth control/ protection: Condom   Other Topics Concern  . Not on file   Social History Narrative  . No narrative on file  Born in [place]. Used to live in [place]. Moved here to help sister, she had polio as child. Divorced. 2 children. No grandchildren. 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 Nonsmoker, no tobacco products. Parents smoked. Occasional marijuana.   FAMILY HISTORY: Family History  Problem Relation Age of Onset  . Alcohol abuse Father   . Depression Sister   . Anxiety  disorder Sister   . Depression Sister   . Anxiety disorder Sister   Both parents are deceased. Mother was 39, CHF, COPD. Father was 42, MI. 3 younger sisters. Healthy.  ALLERGIES:  is allergic to flomax [tamsulosin hcl].  MEDICATIONS:  Current Outpatient Prescriptions  Medication Sig Dispense Refill  . acetaminophen (TYLENOL) 500 MG tablet Take 1,000 mg by mouth every 6 (six) hours as needed for mild pain.    . chlorpheniramine (CHLOR-TRIMETON) 4 MG tablet Take 4 mg by mouth 2 (two) times daily as needed for allergies.    . clonazePAM (KLONOPIN) 1 MG tablet 1 bid 60 tablet 3  . diphenhydrAMINE (BENADRYL) 25 mg capsule Take 25 mg by mouth every 6 (six) hours as needed for allergies or sleep.    . hydrOXYzine (VISTARIL) 25 MG capsule Take 1 capsule (25 mg total) by mouth at bedtime as needed. 30 capsule 7  . levothyroxine (SYNTHROID, LEVOTHROID) 50 MCG tablet  Take 50 mcg by mouth daily before breakfast.     . valACYclovir (VALTREX) 500 MG tablet Take 500 mg by mouth daily as needed (Herpes Virus).    . zolpidem (AMBIEN) 10 MG tablet Take 1 tablet (10 mg total) by mouth at bedtime. 30 tablet 4   No current facility-administered medications for this visit.     Review of Systems  Constitutional: Negative for chills, fever, malaise/fatigue and weight loss.  HENT: Negative for congestion, hearing loss, nosebleeds, sore throat and tinnitus.   Eyes: Negative for blurred vision, double vision, pain and discharge.       Wears bifocals  Respiratory: Negative for cough, hemoptysis, sputum production, shortness of breath and wheezing.   Cardiovascular: Negative for chest pain, palpitations, claudication, leg swelling and PND.  Gastrointestinal: Positive for constipation (chronic, intermittent). Negative for abdominal pain, blood in stool, diarrhea, heartburn, melena, nausea and vomiting.  Genitourinary: Negative for dysuria, frequency, hematuria and urgency.  Musculoskeletal: Negative for back pain,  falls, joint pain and myalgias.  Skin: Negative for itching and rash.  Neurological: Negative for dizziness, tingling, tremors, sensory change, speech change, focal weakness, seizures, loss of consciousness, weakness and headaches.  Endo/Heme/Allergies: Does not bruise/bleed easily.  Psychiatric/Behavioral: Positive for depression. Negative for memory loss, substance abuse and suicidal ideas. The patient is nervous/anxious. The patient does not have insomnia.    14 point ROS was done and is otherwise as detailed above or in HPI   PHYSICAL EXAMINATION: ECOG PERFORMANCE STATUS: 1 - Symptomatic but completely ambulatory   Physical Exam  Constitutional: He is oriented to person, place, and time and well-developed, well-nourished, and in no distress.  HENT:  Head: Normocephalic and atraumatic.  Nose: Nose normal.  Mouth/Throat: Oropharynx is clear and moist. No oropharyngeal exudate.  Eyes: Pupils are equal, round, and reactive to light. Conjunctivae and EOM are normal. Right eye exhibits no discharge. Left eye exhibits no discharge. No scleral icterus.  Neck: Normal range of motion. Neck supple. No tracheal deviation present. No thyromegaly present.  Cardiovascular: Normal rate, regular rhythm and normal heart sounds.  Exam reveals no gallop and no friction rub.   No murmur heard. Pulmonary/Chest: Effort normal and breath sounds normal. He has no wheezes. He has no rales.  Abdominal: Soft. Bowel sounds are normal. He exhibits no distension and no mass. There is no tenderness. There is no rebound and no guarding.  Musculoskeletal: Normal range of motion. He exhibits no edema.  Lymphadenopathy:    He has no cervical adenopathy.  Neurological: He is alert and oriented to person, place, and time. He has normal reflexes. No cranial nerve deficit. Gait normal. Coordination normal.  Skin: Skin is warm and dry. No rash noted.  Psychiatric: Mood, memory, affect and judgment normal.  Nursing note and  vitals reviewed.   LABORATORY DATA:  I have reviewed the data as listed Lab Results  Component Value Date   WBC 6.2 09/15/2016   HGB 15.0 09/15/2016   HCT 42.6 09/15/2016   MCV 94.5 09/15/2016   PLT 185 09/15/2016   CMP     Component Value Date/Time   NA 136 09/15/2016 1034   K 3.9 09/15/2016 1034   CL 101 09/15/2016 1034   CO2 27 09/15/2016 1034   GLUCOSE 102 (H) 09/15/2016 1034   BUN 16 09/15/2016 1034   CREATININE 0.94 09/15/2016 1034   CALCIUM 9.0 09/15/2016 1034   PROT 7.2 09/15/2016 1034   ALBUMIN 4.3 09/15/2016 1034   AST 30 09/15/2016 1034  ALT 25 09/15/2016 1034   ALKPHOS 37 (L) 09/15/2016 1034   BILITOT 0.9 09/15/2016 1034   GFRNONAA >60 09/15/2016 1034   GFRAA >60 09/15/2016 1034         RADIOGRAPHIC STUDIES: I have personally reviewed the radiological images as listed and agreed with the findings in the report. No results found. CLINICAL DATA:  Restaging diffuse large B-cell lymphoma.  EXAM: CT CHEST, ABDOMEN, AND PELVIS WITH CONTRAST  TECHNIQUE: Multidetector CT imaging of the chest, abdomen and pelvis was performed following the standard protocol during bolus administration of intravenous contrast.  CONTRAST:  188m ISOVUE-300 IOPAMIDOL (ISOVUE-300) INJECTION 61%  COMPARISON:  03/23/2014.  PET-CT 11/28/2013.  FINDINGS: CT CHEST FINDINGS  Cardiovascular: Heart size is normal. Coronary artery calcification is noted. Ascending thoracic aorta measures up to 4.3 cm in maximum diameter. Right Port-A-Cath tip is positioned at the mid SVC level.  Mediastinum/Nodes: No mediastinal lymphadenopathy. There is no hilar lymphadenopathy. The esophagus has normal imaging features. There is no axillary lymphadenopathy.  Lungs/Pleura: Stable appearance of lingular scarring. Platelike scarring posterior right lower lobe is also stable. No focal airspace consolidation. No pulmonary nodule or mass. No evidence for pulmonary edema or pleural  effusion.  Musculoskeletal: Bone windows reveal no worrisome lytic or sclerotic osseous lesions.  CT ABDOMEN PELVIS FINDINGS  Hepatobiliary: No focal abnormality within the liver parenchyma. There is no evidence for gallstones, gallbladder wall thickening, or pericholecystic fluid. No intrahepatic or extrahepatic biliary dilation.  Pancreas: No focal mass lesion. No dilatation of the main duct. No intraparenchymal cyst. No peripancreatic edema.  Spleen: Hypo attenuating lesions seen in the anterior spleen on the prior study are less conspicuous today.  Adrenals/Urinary Tract: No adrenal nodule or mass. Kidneys are unremarkable. Tiny cortical lesions left kidney are too small to characterize but likely represent cysts. No evidence for hydroureter. The urinary bladder appears normal for the degree of distention.  Stomach/Bowel: Stomach is nondistended. No gastric wall thickening. No evidence of outlet obstruction. Duodenum is normally positioned as is the ligament of Treitz. No small bowel wall thickening. No small bowel dilatation. The terminal ileum is normal. The appendix is normal. No gross colonic mass. No colonic wall thickening. No substantial diverticular change.  Vascular/Lymphatic: There is abdominal aortic atherosclerosis without aneurysm. There is no gastrohepatic or hepatoduodenal ligament lymphadenopathy. No intraperitoneal or retroperitoneal lymphadenopathy. No pelvic sidewall lymphadenopathy.  Reproductive: The prostate gland and seminal vesicles have normal imaging features.  Other: No intraperitoneal free fluid.  Musculoskeletal: 2 cm lucent lesion right iliac crest unchanged since 03/23/2014. Bone windows reveal no worrisome lytic or sclerotic osseous lesions.  IMPRESSION: 1. Stable exam. No evidence for lymphadenopathy in the chest, abdomen, or pelvis. No new or progressive interval findings. 2. 4.3 cm diameter ascending thoracic aorta  consistent with aneurysm. Recommend annual imaging followup by CTA or MRA. This recommendation follows 2010 ACCF/AHA/AATS/ACR/ASA/SCA/SCAI/SIR/STS/SVM Guidelines for the Diagnosis and Management of Patients with Thoracic Aortic Disease. Circulation. 2010; 121: [id number] 3. Coronary artery atherosclerosis. 4. Stable scarring lingula and right lower lobe. 5. Previously seen hypo attenuating lesion anterior spleen is less conspicuous on today's study.   Electronically Signed   By: EMisty StanleyM.D.   On: 10/17/2015 16:14  ASSESSMENT & PLAN:  DLBCL, stage IV  Clinically NED on exam.  Reviewed labs with the patient in detail. RTC in 6 months for follow up with labs. No need to perform scans unless clinically indicated.    NCCN guidelines recommends the follow surveillance for Stage I, II  for those who asertain a complete response in the first-line treatment setting (3.2017):  A. H&P every 3-6 months for 5 years, then yearly or as clinically indicated.  B. Labs every 3-6 months for 5 years and then annually or as clinically indicated.  C. Imaging only as indicated. For those ascertaining a complete response in the Stage [facility], IV setting in first-line treatment setting:  A. H&P every 3-6 months for 5 years, then yearly or as clinically indicated.  B. Labs every 3-6 months for 5 years and then annually or as clinically indicated.  C. CT C/A/P with contrast no more than every 6 months for 2 years after  completion of treatment; then only as indicated.  Orders Placed This Encounter  Procedures  . CBC with Differential    Standing Status:   Future    Standing Expiration Date:   09/15/2017  . Comprehensive metabolic panel    Standing Status:   Future    Standing Expiration Date:   09/15/2017  . Lactate dehydrogenase    Standing Status:   Future    Standing Expiration Date:   09/15/2017     All questions were answered. The patient knows to call the clinic with any problems,  questions or concerns.   This note was electronically signed.    Twana First, MD   09/15/2016 11:28 AM

## 2016-10-01 ENCOUNTER — Telehealth (HOSPITAL_COMMUNITY): Payer: Self-pay | Admitting: *Deleted

## 2016-10-01 DIAGNOSIS — Z0000 Encounter for general adult medical examination without abnormal findings: Secondary | ICD-10-CM | POA: Diagnosis not present

## 2016-10-01 DIAGNOSIS — Z125 Encounter for screening for malignant neoplasm of prostate: Secondary | ICD-10-CM | POA: Diagnosis not present

## 2016-10-01 DIAGNOSIS — I711 Thoracic aortic aneurysm, ruptured: Secondary | ICD-10-CM | POA: Diagnosis not present

## 2016-10-01 DIAGNOSIS — E039 Hypothyroidism, unspecified: Secondary | ICD-10-CM | POA: Diagnosis not present

## 2016-10-01 DIAGNOSIS — Z7189 Other specified counseling: Secondary | ICD-10-CM | POA: Diagnosis not present

## 2016-10-01 DIAGNOSIS — Z79899 Other long term (current) drug therapy: Secondary | ICD-10-CM | POA: Diagnosis not present

## 2016-10-01 DIAGNOSIS — Z1211 Encounter for screening for malignant neoplasm of colon: Secondary | ICD-10-CM | POA: Diagnosis not present

## 2016-10-01 DIAGNOSIS — Z6825 Body mass index (BMI) 25.0-25.9, adult: Secondary | ICD-10-CM | POA: Diagnosis not present

## 2016-10-01 DIAGNOSIS — Z299 Encounter for prophylactic measures, unspecified: Secondary | ICD-10-CM | POA: Diagnosis not present

## 2016-10-01 DIAGNOSIS — Z1389 Encounter for screening for other disorder: Secondary | ICD-10-CM | POA: Diagnosis not present

## 2016-10-01 DIAGNOSIS — C8590 Non-Hodgkin lymphoma, unspecified, unspecified site: Secondary | ICD-10-CM | POA: Diagnosis not present

## 2016-10-01 DIAGNOSIS — R5383 Other fatigue: Secondary | ICD-10-CM | POA: Diagnosis not present

## 2016-10-01 DIAGNOSIS — E7800 Pure hypercholesterolemia, unspecified: Secondary | ICD-10-CM | POA: Diagnosis not present

## 2016-10-01 DIAGNOSIS — N400 Enlarged prostate without lower urinary tract symptoms: Secondary | ICD-10-CM | POA: Diagnosis not present

## 2016-10-01 NOTE — Telephone Encounter (Signed)
returned phone call regarding an appointment, no answer, left voice message.

## 2016-10-15 ENCOUNTER — Ambulatory Visit (HOSPITAL_COMMUNITY): Payer: Self-pay | Admitting: Psychiatry

## 2016-10-16 ENCOUNTER — Ambulatory Visit (INDEPENDENT_AMBULATORY_CARE_PROVIDER_SITE_OTHER): Payer: Medicare Other | Admitting: Psychiatry

## 2016-10-16 VITALS — BP 120/80 | HR 71 | Ht 73.0 in | Wt 191.8 lb

## 2016-10-16 DIAGNOSIS — F341 Dysthymic disorder: Secondary | ICD-10-CM

## 2016-10-16 MED ORDER — ZOLPIDEM TARTRATE 10 MG PO TABS: 10.0000 mg | ORAL_TABLET | Freq: Every day | ORAL | 4 refills | Status: DC

## 2016-10-16 MED ORDER — HYDROXYZINE PAMOATE 25 MG PO CAPS: ORAL_CAPSULE | ORAL | 7 refills | Status: DC

## 2016-10-16 MED ORDER — CLONAZEPAM 1 MG PO TABS: ORAL_TABLET | ORAL | 3 refills | Status: DC

## 2016-10-16 NOTE — Progress Notes (Signed)
[name], male   DOB: May 05, 1950, 66 y.o.   MRN: [id number] [facility] MD Progress Note  10/16/2016 3:18 PM Douglas Bryant  MRN:  [id number] Subjective:  Mild depression Principal Problem: Dysthymic disorder   Today the patient is seen on time.  The patient is at his baseline. I do think the higher dose Klonopin 1 mg wice a yas been hepful he's not really depressed.  He continues to have conflict with his son who lives with him. Today we talked abot the importance of setting date he's to leave. The patient is not persisty dpressed. There continues to be  A issue with his son is really having an effect is life. The patiet is not suicidl. The patient smokes marijuana once a week. He rarely drnks alcohol. He just started in therapy. Past Medical History:  Past Medical History:  Diagnosis Date  . Anemia   . Aneurysm, aorta, thoracic ([place])   . Anxiety   . Back pain   . Depression   . DLBCL (diffuse large B cell lymphoma) ([place]) 07/17/2015  . Environmental allergies   . History of blood transfusion   . History of bronchitis   . History of chemotherapy   . Hypothyroidism   . Nocturia   . Peripheral vascular disease ([place])   . Urinary frequency     Past Surgical History:  Procedure Laterality Date  . BONE MARROW BIOPSY    . HERNIA REPAIR     umblical times 2; inguinal (left)   . IR REMOVAL TUN ACCESS W/ PORT W/O FL MOD SED  07/08/2016  . port a cath placement    . RHINOPLASTY     Family History:  Family History  Problem Relation Age of Onset  . Alcohol abuse Father   . Depression Sister   . Anxiety disorder Sister   . Depression Sister   . Anxiety disorder Sister    Family Psychiatric  History:  Social History:  History  Alcohol Use  . 3.6 oz/week  . 6 Cans of beer per week    Comment: beer occas     History  Drug Use No    Comment: smokes daily for thirty years. currently takes one puff    Social History   Social History  . Marital status: Divorced   Spouse name: N/A  . Number of children: N/A  . Years of education: N/A   Social History Main Topics  . Smoking status: Never Smoker  . Smokeless tobacco: Never Used  . Alcohol use 3.6 oz/week    6 Cans of beer per week     Comment: beer occas  . Drug use: No     Comment: smokes daily for thirty years. currently takes one puff  . Sexual activity: Yes    Partners: Female    Birth control/ protection: Condom   Other Topics Concern  . Not on file   Social History Narrative  . No narrative on file   Additional Social History:                         Sleep: Fair  Appetite:  Good  Current Medications: Current Outpatient Prescriptions  Medication Sig Dispense Refill  . acetaminophen (TYLENOL) 500 MG tablet Take 1,000 mg by mouth every 6 (six) hours as needed for mild pain.    . chlorpheniramine (CHLOR-TRIMETON) 4 MG tablet Take 4 mg by mouth 2 (two) times daily as needed for allergies.    Marland Kitchen  clonazePAM (KLONOPIN) 1 MG tablet 1 bid 60 tablet 3  . diphenhydrAMINE (BENADRYL) 25 mg capsule Take 25 mg by mouth every 6 (six) hours as needed for allergies or sleep.    . hydrOXYzine (VISTARIL) 25 MG capsule 1  qhs   1  prn 60 capsule 7  . levothyroxine (SYNTHROID, LEVOTHROID) 50 MCG tablet Take 50 mcg by mouth daily before breakfast.     . valACYclovir (VALTREX) 500 MG tablet Take 500 mg by mouth daily as needed (Herpes Virus).    . zolpidem (AMBIEN) 10 MG tablet Take 1 tablet (10 mg total) by mouth at bedtime. 30 tablet 4   No current facility-administered medications for this visit.     Lab Results: No results found for this or any previous visit (from the past 48 hour(s)).  Blood Alcohol level:  No results found for: [facility]  Physical Findings: AIMS:  , ,  ,  ,    CIWA:    COWS:     Musculoskeletal: Strength & Muscle Tone: within normal limits Gait & Station: normal Patient leans: N/A  Psychiatric Specialty Exam: ROS  Blood pressure 120/80, pulse 71, height '6\' 1"'$   (1.854 m), weight 191 lb 12.8 oz (87 kg).Body mass index is 25.3 kg/m.  General Appearance: Casual  Eye Contact::  Good  Speech:  Clear and Coherent  Volume:  Normal  Mood:  Euthymic  Affect:  Congruent  Thought Process:  Coherent  Orientation:  Full (Time, Place, and Person)  Thought Content:  WDL  Suicidal Thoughts:  No  Homicidal Thoughts:  No  Memory:  NA  Judgement:  Good  Insight:  Fair  Psychomotor Activity:  Normal  Concentration:  Good  Recall:  Good  Fund of Knowledge:Good  Language: Fair  Akathisia:  No  Handed:  Right  AIMS (if indicated):     Assets:  Desire for Improvement  ADL's:  Intact  Cognition: WNL  Sleep:      Treatment Plan Summary: 10/16/2016,     At this time the patient willcontinue taking Klonopin1 mg twice a da cotinue on Amben 10 mg. He'll begin on Vistaril 5 m taking one or 2at night. Hell ne in [place]. Return to see me in 4 months. He'll eventuall have to get hsso o leave. I will not changeany of his mdiines. This patient is notsuicidal. He denies chest pain , shortness of breath review any other physical omplain is no suicidal.

## 2016-10-20 ENCOUNTER — Other Ambulatory Visit: Payer: Self-pay | Admitting: *Deleted

## 2016-10-20 ENCOUNTER — Telehealth (HOSPITAL_COMMUNITY): Payer: Self-pay

## 2016-10-20 DIAGNOSIS — R0602 Shortness of breath: Secondary | ICD-10-CM

## 2016-10-20 DIAGNOSIS — I7121 Aneurysm of the ascending aorta, without rupture: Secondary | ICD-10-CM

## 2016-10-20 DIAGNOSIS — I712 Thoracic aortic aneurysm, without rupture: Secondary | ICD-10-CM

## 2016-10-20 NOTE — Telephone Encounter (Signed)
Medication management - Telephone message left for pt. that Dr. Casimiro Needle did send a Hydroxyzine new order in with 7 refills but was sent to [facility] and not [facility] on 10/16/16.  Requested patient have [facility] contact [facility] to transfer order and if patient had any problems with this to call back so we could contact Dr. Casimiro Needle to get a new order to send.

## 2016-10-23 ENCOUNTER — Ambulatory Visit (INDEPENDENT_AMBULATORY_CARE_PROVIDER_SITE_OTHER): Payer: Medicare Other | Admitting: Licensed Clinical Social Worker

## 2016-10-23 DIAGNOSIS — F341 Dysthymic disorder: Secondary | ICD-10-CM | POA: Diagnosis not present

## 2016-10-23 NOTE — Progress Notes (Signed)
   THERAPIST PROGRESS NOTE  Session Time: 10:00 am -11:00 am  Participation Level: Active  Behavioral Response: CasualAlertAnxious  Type of Therapy: Individual Therapy  Treatment Goals addressed: Anxiety  Interventions: CBT and Solution Focused  Summary: Douglas [facility] is a 66 y.o. male who presents  that presents oriented x5 (person, place, situation, time and object), alert, anxious, casually dressed, appropriately groomed, and cooperative to address mood and anxiety. Patient has a history of medical treatment including lymphoma. Patient has a history of mental health treatment including outpatient therapy and medication management. Patient denies symptoms of mania. He admits to passive thoughts of suicide but denies a plan and means. Patient denies homicidal ideations. Patient denies psychosis including auditory and visual hallucination. Patient denies substance abuse. Patient is at low risk for lethality at this time.   Patient had an average score of 4 out of 10 on the Outcome Rating Scale. Patient shared his frustration with his son who has not been contributing to the household. Patient reported that his son has not been working or doing anything. Patient shared his struggles with OCD and how asking his son to leave if he doesn't contribute causes him great anxiety. Patient committed to hold his son accountable for contributing to the household. Patient rated the session 10 out of 10 on the Session Rating Scale.  Patient engaged in session. He responded well to interventions. 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 Patient will continue in outpatient therapy due to being the least restrictive service to meet his needs. Patient made no progress on his goal at this time.   Suicidal/Homicidal: Negativewithout intent/plan  Therapist Response: Therapist reviewed patient's recent thoughts and behaviors. Therapist utilized CBT to  address anxiety and mood. Therapist processed patient's feelings to identify triggers for mood. Therapist assisted patient in identifying ways to hold son accountable. Therapist committed patient to hold his son accountable for contributing to the household. Therapist administered the Outcome Rating Scale and Session Rating Scale.   Plan: Return again in 3 weeks. Therapist will review patient goals on or before 10.23.2018  Diagnosis: Axis I: Dysthymic Disorder    Axis II: No diagnosis    Glori Bickers, LCSW 10/23/2016

## 2016-11-11 ENCOUNTER — Other Ambulatory Visit: Payer: Self-pay | Admitting: *Deleted

## 2016-11-11 ENCOUNTER — Ambulatory Visit
Admission: RE | Admit: 2016-11-11 | Discharge: 2016-11-11 | Disposition: A | Discharge status: Home / Self Care | Payer: Medicare Other | Source: Ambulatory Visit | Attending: Thoracic Surgery (Cardiothoracic Vascular Surgery) | Admitting: Thoracic Surgery (Cardiothoracic Vascular Surgery)

## 2016-11-11 ENCOUNTER — Ambulatory Visit (INDEPENDENT_AMBULATORY_CARE_PROVIDER_SITE_OTHER): Payer: Medicare Other | Admitting: Thoracic Surgery (Cardiothoracic Vascular Surgery)

## 2016-11-11 ENCOUNTER — Encounter: Payer: Self-pay | Admitting: Thoracic Surgery (Cardiothoracic Vascular Surgery)

## 2016-11-11 VITALS — BP 145/93 | HR 62 | Ht 73.0 in | Wt 197.0 lb

## 2016-11-11 DIAGNOSIS — I712 Thoracic aortic aneurysm, without rupture: Secondary | ICD-10-CM

## 2016-11-11 DIAGNOSIS — R0602 Shortness of breath: Secondary | ICD-10-CM

## 2016-11-11 DIAGNOSIS — I7121 Aneurysm of the ascending aorta, without rupture: Secondary | ICD-10-CM

## 2016-11-11 DIAGNOSIS — R011 Cardiac murmur, unspecified: Secondary | ICD-10-CM

## 2016-11-11 MED ORDER — METOPROLOL SUCCINATE ER 25 MG PO TB24: 25.0000 mg | ORAL_TABLET | Freq: Every day | ORAL | 5 refills | Status: DC

## 2016-11-11 NOTE — Progress Notes (Signed)
[address]       [place] [address]             [phone]     HPI: [name] returns for follow-up regarding an ascending aneurysm  [name] is a 66 year old man with a history of stage IV non-Hodgkin's lymphoma, borderline hypertension, anxiety and depression. 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   Last year he had a CT for follow-up of his non-Hodgkin's lymphoma post treatment. It showed a 4.3 cm ascending aneurysm. The aneurysm had been present previously but just not been noted in the report.  He says that he's been under some stress recently due to issues with family members. He's not having any chest pain or shortness of breath.  Past Medical History:  Diagnosis Date  . Anemia   . Aneurysm, aorta, thoracic ([place])   . Anxiety   . Back pain   . Depression   . DLBCL (diffuse large B cell lymphoma) ([place]) 07/17/2015  . Environmental allergies   . History of blood transfusion   . History of bronchitis   . History of chemotherapy   . Hypothyroidism   . Nocturia   . Peripheral vascular disease ([place])   . Urinary frequency     Current Outpatient Prescriptions  Medication Sig Dispense Refill  . acetaminophen (TYLENOL) 500 MG tablet Take 1,000 mg by mouth every 6 (six) hours as needed for mild pain.    . chlorpheniramine (CHLOR-TRIMETON) 4 MG tablet Take 4 mg by mouth 2 (two) times daily as needed for allergies.    . clonazePAM (KLONOPIN) 1 MG tablet 1 bid 60 tablet 3  . diphenhydrAMINE (BENADRYL) 25 mg capsule Take 25 mg by mouth every 6 (six) hours as needed for allergies or sleep.    . hydrOXYzine (VISTARIL) 25 MG capsule 1  qhs   1  prn 60 capsule 7  . levothyroxine (SYNTHROID, LEVOTHROID) 50 MCG tablet Take 50 mcg by mouth daily before breakfast.     . valACYclovir (VALTREX) 500 MG tablet Take 500 mg by mouth daily as needed (Herpes Virus).    . zolpidem (AMBIEN) 10 MG tablet Take 1 tablet (10 mg total) by mouth  at bedtime. 30 tablet 4  . metoprolol succinate (TOPROL XL) 25 MG 24 hr tablet Take 1 tablet (25 mg total) by mouth daily. 30 tablet 5   No current facility-administered medications for this visit.     Physical Exam BP (!) 145/93   Pulse 62   Ht 6\' 1"  (1.854 m)   Wt 197 lb (89.4 kg)   SpO2 98%   BMI 25.40 kg/m  66 year old man in no acute distress Flat affect Alert and oriented 3 with no focal neurologic deficits No carotid bruits Cardiac regular rate and rhythm with a 2/6 systolic murmur Lungs clear with equal breath sounds bilaterally  Diagnostic Tests: CT CHEST WITHOUT CONTRAST  TECHNIQUE: Multidetector CT imaging of the chest was performed following the standard protocol without IV contrast.  COMPARISON:  10/17/2015  FINDINGS: Cardiovascular: Maximal diameter of the ascending aorta is 4.6 cm compared with 4.3 cm on the prior study. Mild atherosclerotic calcifications of the aortic arch and great vessels. Lack of intravenous contrast prevents evaluation of the aortic intima. Descending thoracic aorta is nonaneurysmal. Three vessel coronary artery calcifications.  Mediastinum/Nodes: No abnormal mediastinal adenopathy. No pericardial effusion. No mediastinal hemorrhage. Visualized thyroid is unremarkable. Esophagus is unremarkable.  Lungs/Pleura: No pneumothorax.  No pleural effusion. Linear scarring versus atelectasis in the medial right middle lobe. Dependent atelectasis at the right lung base. Nodular and linear opacity in the lingula is stable. This is directly compared with prior studies dated 10/17/2015 and 03/23/2014.  Upper Abdomen: No acute abnormality.  Musculoskeletal: No acute rib fracture.  Stable thoracic spine.  IMPRESSION: Aneurysmal dilatation of the ascending aorta has increased from a maximal diameter of 4.3 cm to 4.6 cm. Ascending thoracic aortic aneurysm. Recommend semi-annual imaging followup by CTA or MRA and referral to  cardiothoracic surgery if not already obtained. This recommendation follows 2010 ACCF/AHA/AATS/ACR/ASA/SCA/SCAI/SIR/STS/SVM Guidelines for the Diagnosis and Management of Patients With Thoracic Aortic Disease. Circulation. 2010; 121: W102-V253  Stable scarring in the right middle lobe and lingula.  Aortic Atherosclerosis (ICD10-I70.0).   Electronically Signed   By: Marybelle Killings M.D.   On: 11/11/2016 12:59 I personally reviewed the CT chest and concur with the findings noted above  Impression: [name] is a 66 year old gentleman with a history of non-Hodgkin's lymphoma who was incidentally noted to have an ascending aneurysm on the CT about a year ago.  Ascending aneurysm- is increased in size from 4.3 to 4.6 cm over the past 12 months.That is not indication for surgery but is certainly concerning. We will rescan him again in 6 months.  Hypertension- blood pressure is elevated at 145/93. This is certainly too high in the setting of 4.6 cm aneurysm. I'm going to start him on Toprol-XL 25 mg daily.I advised him to get a blood pressure cuff and to check his blood pressure daily basis. Our goal is to be less than 130/80.  Heart murmur- I hear a faint murmur on exam today. I did not notice that when I saw him a year ago. I'm going to check a 2-D echo to assess LV and valvular function.  Plan: 2-D echocardiogram Toprol-XL 25 mg daily Return in 6 months with CT of chest  Melrose Nakayama, MD [facility] [phone]

## 2016-11-13 ENCOUNTER — Ambulatory Visit (INDEPENDENT_AMBULATORY_CARE_PROVIDER_SITE_OTHER): Payer: Medicare Other | Admitting: Licensed Clinical Social Worker

## 2016-11-13 DIAGNOSIS — F341 Dysthymic disorder: Secondary | ICD-10-CM | POA: Diagnosis not present

## 2016-11-13 NOTE — Progress Notes (Signed)
   THERAPIST PROGRESS NOTE  Session Time: 1:15 pm -2:00 pm  Participation Level: Active  Behavioral Response: CasualAlertAnxious  Type of Therapy: Individual Therapy  Treatment Goals addressed: Anxiety  Interventions: CBT and Solution Focused  Summary: Douglas Bryant is a 66 y.o. male who presents  that presents oriented x5 (person, place, situation, time and object), alert, anxious, casually dressed, appropriately groomed, and cooperative to address mood and anxiety. Patient has a history of medical treatment including lymphoma. Patient has a history of mental health treatment including outpatient therapy and medication management. Patient denies symptoms of mania. He admits to passive thoughts of suicide but denies a plan and means. Patient denies homicidal ideations. Patient denies psychosis including auditory and visual hallucination. Patient denies substance abuse. Patient is at low risk for lethality at this time.   Patient had an average score of 5.25 out of 10 on the Outcome Rating Scale. Patient explained that his son did contribute to the home and gave him money. Patient also noted that he has joined the [facility] and has gone a few times. Patient explained that he has experienced an increase in irritability. Patient continues to be frustrated with his son and his sister Patient explained that his sister didn't do anything its just that he would rather live by himself. Patient explained his son doesn't clean up after himself which adds to his frustration. Patient also noted that he feels overwhelmed with keep his home clean or doing any task. After discussion, patient understood that he can break his tasks into smaller manageable tasks that will use less time and energy but leave him with a sense of accomplishment. Patient committed to continue to hold his son accountable, go to the [facility] and break tasks up into manageable tasks. Patient rated the session 10 out of 10 on the Session Rating  Scale.  Patient engaged in session. He responded well to interventions. 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 Patient will continue in outpatient therapy due to being the least restrictive service to meet his needs. Patient made minimal  progress on his goal at this time.   Suicidal/Homicidal: Negativewithout intent/plan  Therapist Response: Therapist reviewed patient's recent thoughts and behaviors. Therapist utilized CBT to address anxiety and mood. Therapist processed patient's feelings to identify triggers for mood. Therapist discussed ways patient can remain active and overcome his lack of energy and irritability. Therapist administered the Outcome Rating Scale and Session Rating Scale.   Plan: Return again in 3 weeks. Therapist will review patient goals on or before 10.23.2018  Diagnosis: Axis I: Dysthymic Disorder    Axis II: No diagnosis    Glori Bickers, LCSW 11/13/2016

## 2016-11-14 ENCOUNTER — Ambulatory Visit (HOSPITAL_COMMUNITY): Payer: Medicare Other | Attending: Cardiovascular Disease

## 2016-11-14 ENCOUNTER — Other Ambulatory Visit: Payer: Self-pay

## 2016-11-14 DIAGNOSIS — C8330 Diffuse large B-cell lymphoma, unspecified site: Secondary | ICD-10-CM | POA: Diagnosis not present

## 2016-11-14 DIAGNOSIS — R011 Cardiac murmur, unspecified: Secondary | ICD-10-CM | POA: Insufficient documentation

## 2017-02-06 ENCOUNTER — Encounter (HOSPITAL_COMMUNITY): Payer: Self-pay | Admitting: Licensed Clinical Social Worker

## 2017-02-06 ENCOUNTER — Ambulatory Visit (INDEPENDENT_AMBULATORY_CARE_PROVIDER_SITE_OTHER): Payer: Medicare Other | Admitting: Licensed Clinical Social Worker

## 2017-02-06 DIAGNOSIS — F4322 Adjustment disorder with anxiety: Secondary | ICD-10-CM | POA: Diagnosis not present

## 2017-02-06 DIAGNOSIS — F341 Dysthymic disorder: Secondary | ICD-10-CM | POA: Diagnosis not present

## 2017-02-06 NOTE — Progress Notes (Signed)
   THERAPIST PROGRESS NOTE  Session Time: 11:10 am -11:50 pm  Participation Level: Active  Behavioral Response: CasualAlertAnxious  Type of Therapy: Individual Therapy  Treatment Goals addressed: Anxiety  Interventions: CBT and Solution Focused  Summary: Douglas Bryant is a 67 y.o. male who presents  that presents oriented x5 (person, place, situation, time and object), alert, anxious, casually dressed, appropriately groomed, and cooperative to address mood and anxiety. Patient has a history of medical treatment including lymphoma. Patient has a history of mental health treatment including outpatient therapy and medication management. Patient denies symptoms of mania. He admits to passive thoughts of suicide but denies a plan and means. Patient denies homicidal ideations. Patient denies psychosis including auditory and visual hallucination. Patient denies substance abuse. Patient is at low risk for lethality at this time.   Patient was in a good mood but distracted. Patient reported that things were going well for him, his son had moved out which reduced his anxiety. Patient noted that his son has moved back in temporarily. He says his son is trying to improve himself but is not very responsible. Patient explained that his son drove a car that belonged to patient's mother and it stopped working due to lack of maintenance. Patient is worried that his son will want to use his car for his job and that his son will take over his car. Patient has also stated that he told his son that he wants him to live with his mother and he would not "come get him" when he didn't get along with his mother. Patient feels like he has made minimal progress on his goals.   Patient engaged in session. He responded well to interventions. 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 Patient will continue in outpatient therapy due to being the least restrictive service  to meet his needs. Patient made minimal  progress on his goal at this time.   Suicidal/Homicidal: Negativewithout intent/plan  Therapist Response: Therapist reviewed patient's recent thoughts and behaviors. Therapist utilized CBT to address anxiety and mood. Therapist processed patient's feelings to identify triggers for mood. Therapist discussed setting boundaries with patient related to his son. Therapist reviewed patient's goals.   Plan: Return again in 3 weeks. Therapist will review patient goals on or before 04.11.2019  Diagnosis: Axis I: Dysthymic Disorder    Axis II: No diagnosis    Glori Bickers, LCSW 02/06/2017

## 2017-02-18 ENCOUNTER — Ambulatory Visit (INDEPENDENT_AMBULATORY_CARE_PROVIDER_SITE_OTHER): Payer: Medicare Other | Admitting: Psychiatry

## 2017-02-18 VITALS — BP 141/89 | HR 86 | Ht 73.0 in

## 2017-02-18 DIAGNOSIS — Z811 Family history of alcohol abuse and dependence: Secondary | ICD-10-CM

## 2017-02-18 DIAGNOSIS — F341 Dysthymic disorder: Secondary | ICD-10-CM | POA: Diagnosis not present

## 2017-02-18 DIAGNOSIS — Z79899 Other long term (current) drug therapy: Secondary | ICD-10-CM

## 2017-02-18 DIAGNOSIS — Z818 Family history of other mental and behavioral disorders: Secondary | ICD-10-CM | POA: Diagnosis not present

## 2017-02-18 MED ORDER — CLONAZEPAM 1 MG PO TABS: ORAL_TABLET | ORAL | 3 refills | Status: DC

## 2017-02-18 MED ORDER — ZOLPIDEM TARTRATE 10 MG PO TABS: 10.0000 mg | ORAL_TABLET | Freq: Every day | ORAL | 4 refills | Status: DC

## 2017-02-18 NOTE — Progress Notes (Signed)
[name], male   DOB: 29-Apr-1950, 67 y.o.   MRN: [id number] [facility] MD Progress Note  02/18/2017 4:14 PM Douglas Bryant  MRN:  [id number] Subjective:  Mild depression Principal Problem: Dysthymic disorder Today the patient is at his baseline. He does not out that he has a aortic aneurysm. He also is diagnosed with hypertension.he patient was started onand antidepressant. The patient's mood is fairly good. He denies daily depression. He says he has anxiety but it is not all that significant. He takes Klonopin 1 mg twice a day. Actually takes one 5 actually needs it during the day. I do believe he sleeping as well as he takes that together with the Ambien. He's eating well. Enjoys music and plays guitar. His son is actually doing better. Now selling insurance the dynamics are that his ex-wife by car for her son G very reason is the son needs a car to do his insurance business.This patient smokes marijuana at least once a week. He drinks no alcohol.Demonstrates no evidence of psychosis. He is at baseline. I do not think he misuses his Klonopin. He actually takes more and feels oversedated. He requested [place] to change him back to Ativan but I refused I think Klonopin takes care of well. Past Medical History:  Past Medical History:  Diagnosis Date  . Anemia   . Aneurysm, aorta, thoracic ([place])   . Anxiety   . Back pain   . Depression   . DLBCL (diffuse large B cell lymphoma) ([place]) 07/17/2015  . Environmental allergies   . History of blood transfusion   . History of bronchitis   . History of chemotherapy   . Hypothyroidism   . Nocturia   . Peripheral vascular disease ([place])   . Urinary frequency     Past Surgical History:  Procedure Laterality Date  . BONE MARROW BIOPSY    . HERNIA REPAIR     umblical times 2; inguinal (left)   . IR REMOVAL TUN ACCESS W/ PORT W/O FL MOD SED  07/08/2016  . port a cath placement    . RHINOPLASTY     Family History:  Family History  Problem  Relation Age of Onset  . Alcohol abuse Father   . Depression Sister   . Anxiety disorder Sister   . Depression Sister   . Anxiety disorder Sister    Family Psychiatric  History:  Social History:  Social History   Substance and Sexual Activity  Alcohol Use Yes  . Alcohol/week: 3.6 oz  . Types: 6 Cans of beer per week   Comment: beer occas     Social History   Substance and Sexual Activity  Drug Use No   Comment: smokes daily for thirty years. currently takes one puff    Social History   Socioeconomic History  . Marital status: Divorced    Spouse name: Not on file  . Number of children: Not on file  . Years of education: Not on file  . Highest education level: Not on file  Social Needs  . Financial resource strain: Not on file  . Food insecurity - worry: Not on file  . Food insecurity - inability: Not on file  . Transportation needs - medical: Not on file  . Transportation needs - non-medical: Not on file  Occupational History  . Not on file  Tobacco Use  . Smoking status: Never Smoker  . Smokeless tobacco: Never Used  Substance and Sexual Activity  . Alcohol use:  Yes    Alcohol/week: 3.6 oz    Types: 6 Cans of beer per week    Comment: beer occas  . Drug use: No    Comment: smokes daily for thirty years. currently takes one puff  . Sexual activity: Yes    Partners: Female    Birth control/protection: Condom  Other Topics Concern  . Not on file  Social History Narrative  . Not on file   Additional Social History:                         Sleep: Fair  Appetite:  Good  Current Medications: Current Outpatient Medications  Medication Sig Dispense Refill  . acetaminophen (TYLENOL) 500 MG tablet Take 1,000 mg by mouth every 6 (six) hours as needed for mild pain.    . chlorpheniramine (CHLOR-TRIMETON) 4 MG tablet Take 4 mg by mouth 2 (two) times daily as needed for allergies.    . clonazePAM (KLONOPIN) 1 MG tablet 1 bid 60 tablet 3  .  diphenhydrAMINE (BENADRYL) 25 mg capsule Take 25 mg by mouth every 6 (six) hours as needed for allergies or sleep.    . hydrOXYzine (VISTARIL) 25 MG capsule 1  qhs   1  prn 60 capsule 7  . levothyroxine (SYNTHROID, LEVOTHROID) 50 MCG tablet Take 50 mcg by mouth daily before breakfast.     . metoprolol succinate (TOPROL XL) 25 MG 24 hr tablet Take 1 tablet (25 mg total) by mouth daily. 30 tablet 5  . valACYclovir (VALTREX) 500 MG tablet Take 500 mg by mouth daily as needed (Herpes Virus).    . zolpidem (AMBIEN) 10 MG tablet Take 1 tablet (10 mg total) by mouth at bedtime. 30 tablet 4   No current facility-administered medications for this visit.     Lab Results: No results found for this or any previous visit (from the past 48 hour(s)).  Blood Alcohol level:  No results found for: ETH  Physical Findings: AIMS:  , ,  ,  ,    CIWA:    COWS:     Musculoskeletal: Strength & Muscle Tone: within normal limits Gait & Station: normal Patient leans: N/A  Psychiatric Specialty Exam: ROS  Blood pressure (!) 141/89, pulse 86, height 6' 1" (1.854 m).Body mass index is 25.99 kg/m.  General Appearance: Casual  Eye Contact::  Good  Speech:  Clear and Coherent  Volume:  Normal  Mood:  Euthymic  Affect:  Congruent  Thought Process:  Coherent  Orientation:  Full (Time, Place, and Person)  Thought Content:  WDL  Suicidal Thoughts:  No  Homicidal Thoughts:  No  Memory:  NA  Judgement:  Good  Insight:  Fair  Psychomotor Activity:  Normal  Concentration:  Good  Recall:  Good  Fund of Knowledge:Good  Language: Fair  Akathisia:  No  Handed:  Right  AIMS (if indicated):     Assets:  Desire for Improvement  ADL's:  Intact  Cognition: WNL  Sleep:      Treatment Plan Summary: 02/18/2017,  This patient seems to be doing fairly well. He'll continue taking Klonopin 1 mg at night and actually 1 mg during the day and feels anxious. He continues taking Ambien. He is eating and sleeping fairly  well. The good news is the patient is started in therapy. The therapist is about 10 minutes away from his home. He's been 4 times feels like it's beneficial. This patient she'll return to see   me in 4 months. He denies chest pain or shortness of breath. He takes medicines for blood pressure or regular basis.He's been followed closely for his aortic aneurysm. 

## 2017-02-27 ENCOUNTER — Encounter (HOSPITAL_COMMUNITY): Payer: Self-pay | Admitting: Licensed Clinical Social Worker

## 2017-02-27 ENCOUNTER — Ambulatory Visit (INDEPENDENT_AMBULATORY_CARE_PROVIDER_SITE_OTHER): Payer: Medicare Other | Admitting: Licensed Clinical Social Worker

## 2017-02-27 DIAGNOSIS — F341 Dysthymic disorder: Secondary | ICD-10-CM | POA: Diagnosis not present

## 2017-02-27 NOTE — Progress Notes (Signed)
   THERAPIST PROGRESS NOTE  Session Time: 11:00 am -11:50 am  Participation Level: Active  Behavioral Response: CasualAlertAnxious  Type of Therapy: Individual Therapy  Treatment Goals addressed: Anxiety  Interventions: CBT and Solution Focused  Summary: Dewan Emond III is a 67 y.o. male who presents  that presents oriented x5 (person, place, situation, time and object), alert, anxious, casually dressed, appropriately groomed, and cooperative to address mood and anxiety. Patient has a history of medical treatment including lymphoma. Patient has a history of mental health treatment including outpatient therapy and medication management. Patient denies symptoms of mania. He admits to passive thoughts of suicide but denies a plan and means. Patient denies homicidal ideations. Patient denies psychosis including auditory and visual hallucination. Patient denies substance abuse. Patient is at low risk for lethality at this time.   Physically: Patient reported that he is taking blood pressure medication which causes him to feel more tired. He noted that he has been drinking a lot of caffeine and feels like it interacts or counteracts the blood pressure medicine. Patient has a desire to stop drinking caffeine. He has been drinking water with apple cider vinegar in it and that has curbed his desire for coffee in the morning. Patient plans to reduce his caffeine intake and switch to caffeine free beverages then over to water. Patient continues to smoke cannabis, a puff a night. He notes that it benefits him because it gives him the energy/motivation to do things like learn a new song on the guitar which he wouldn't normally do.  Spiritually/values: No issues identified.  Relationships: Patient reported an improved relationship with his son. Patient's son is living with his mother and comes over to patient's house as well. Patient notes that having some space has improved his relationship with his son.   Emotional/Mental/Behavior: Patient reports a fair mood. He notes that some of his medications bring him down at times but he has been maintaining his mood.   Patient engaged in session. He responded well to interventions. 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 Patient will continue in outpatient therapy due to being the least restrictive service to meet his needs. Patient made minimal  progress on his goal at this time.   Suicidal/Homicidal: Negativewithout intent/plan  Therapist Response: Therapist reviewed patient's recent thoughts and behaviors. Therapist utilized CBT to address anxiety and mood. Therapist processed patient's feelings to identify triggers for mood. Therapist had patient identify ways to improve his physical health and in turn improve his mood.   Plan: Return again in 3 weeks. Therapist will review patient goals on or before 04.11.2019  Diagnosis: Axis I: Dysthymic Disorder    Axis II: No diagnosis    Glori Bickers, LCSW 02/27/2017

## 2017-03-12 DIAGNOSIS — N529 Male erectile dysfunction, unspecified: Secondary | ICD-10-CM | POA: Diagnosis not present

## 2017-03-12 DIAGNOSIS — Z6826 Body mass index (BMI) 26.0-26.9, adult: Secondary | ICD-10-CM | POA: Diagnosis not present

## 2017-03-12 DIAGNOSIS — C8590 Non-Hodgkin lymphoma, unspecified, unspecified site: Secondary | ICD-10-CM | POA: Diagnosis not present

## 2017-03-12 DIAGNOSIS — E7800 Pure hypercholesterolemia, unspecified: Secondary | ICD-10-CM | POA: Diagnosis not present

## 2017-03-12 DIAGNOSIS — J329 Chronic sinusitis, unspecified: Secondary | ICD-10-CM | POA: Diagnosis not present

## 2017-03-12 DIAGNOSIS — Z299 Encounter for prophylactic measures, unspecified: Secondary | ICD-10-CM | POA: Diagnosis not present

## 2017-03-12 DIAGNOSIS — E039 Hypothyroidism, unspecified: Secondary | ICD-10-CM | POA: Diagnosis not present

## 2017-03-12 DIAGNOSIS — I711 Thoracic aortic aneurysm, ruptured: Secondary | ICD-10-CM | POA: Diagnosis not present

## 2017-03-12 DIAGNOSIS — N400 Enlarged prostate without lower urinary tract symptoms: Secondary | ICD-10-CM | POA: Diagnosis not present

## 2017-03-12 DIAGNOSIS — F419 Anxiety disorder, unspecified: Secondary | ICD-10-CM | POA: Diagnosis not present

## 2017-03-12 DIAGNOSIS — Z789 Other specified health status: Secondary | ICD-10-CM | POA: Diagnosis not present

## 2017-03-12 DIAGNOSIS — B009 Herpesviral infection, unspecified: Secondary | ICD-10-CM | POA: Diagnosis not present

## 2017-03-18 ENCOUNTER — Other Ambulatory Visit (HOSPITAL_COMMUNITY): Payer: Self-pay

## 2017-03-18 ENCOUNTER — Ambulatory Visit (HOSPITAL_COMMUNITY): Payer: Self-pay | Admitting: Oncology

## 2017-04-08 ENCOUNTER — Encounter (HOSPITAL_COMMUNITY): Payer: Self-pay | Admitting: Internal Medicine

## 2017-04-08 ENCOUNTER — Inpatient Hospital Stay (HOSPITAL_COMMUNITY): Payer: Medicare Other | Attending: Internal Medicine

## 2017-04-08 ENCOUNTER — Inpatient Hospital Stay (HOSPITAL_BASED_OUTPATIENT_CLINIC_OR_DEPARTMENT_OTHER): Payer: Medicare Other | Admitting: Internal Medicine

## 2017-04-08 VITALS — BP 132/79 | HR 66 | Temp 97.6°F | Resp 18 | Wt 195.0 lb

## 2017-04-08 DIAGNOSIS — E039 Hypothyroidism, unspecified: Secondary | ICD-10-CM | POA: Diagnosis not present

## 2017-04-08 DIAGNOSIS — C8330 Diffuse large B-cell lymphoma, unspecified site: Secondary | ICD-10-CM | POA: Insufficient documentation

## 2017-04-08 LAB — COMPREHENSIVE METABOLIC PANEL WITH GFR
ALT: 24 U/L (ref 17–63)
AST: 30 U/L (ref 15–41)
Albumin: 4.3 g/dL (ref 3.5–5.0)
Alkaline Phosphatase: 40 U/L (ref 38–126)
Anion gap: 10 (ref 5–15)
BUN: 16 mg/dL (ref 6–20)
CO2: 26 mmol/L (ref 22–32)
Calcium: 9.8 mg/dL (ref 8.9–10.3)
Chloride: 101 mmol/L (ref 101–111)
Creatinine, Ser: 0.93 mg/dL (ref 0.61–1.24)
GFR calc Af Amer: >60 mL/min
GFR calc non Af Amer: >60 mL/min
Glucose, Bld: 135 mg/dL — ABNORMAL HIGH (ref 65–99)
Potassium: 3.8 mmol/L (ref 3.5–5.1)
Sodium: 137 mmol/L (ref 135–145)
Total Bilirubin: 1.1 mg/dL (ref 0.3–1.2)
Total Protein: 7.3 g/dL (ref 6.5–8.1)

## 2017-04-08 LAB — CBC WITH DIFFERENTIAL/PLATELET
BASOS PCT: 2 %
Basophils Absolute: 0.1 10*3/uL (ref 0.0–0.1)
Eosinophils Absolute: 0.6 10*3/uL (ref 0.0–0.7)
Eosinophils Relative: 10 %
HEMATOCRIT: 43.7 % (ref 39.0–52.0)
HEMOGLOBIN: 15.3 g/dL (ref 13.0–17.0)
LYMPHS ABS: 2 10*3/uL (ref 0.7–4.0)
Lymphocytes Relative: 36 %
MCH: 33.1 pg (ref 26.0–34.0)
MCHC: 35 g/dL (ref 30.0–36.0)
MCV: 94.6 fL (ref 78.0–100.0)
MONOS PCT: 8 %
Monocytes Absolute: 0.5 10*3/uL (ref 0.1–1.0)
NEUTROS ABS: 2.5 10*3/uL (ref 1.7–7.7)
NEUTROS PCT: 44 %
Platelets: 189 10*3/uL (ref 150–400)
RBC: 4.62 MIL/uL (ref 4.22–5.81)
RDW: 12.9 % (ref 11.5–15.5)
WBC: 5.7 10*3/uL (ref 4.0–10.5)

## 2017-04-08 LAB — LACTATE DEHYDROGENASE: LDH: 193 U/L — ABNORMAL HIGH (ref 98–192)

## 2017-04-08 NOTE — Patient Instructions (Signed)
[place] at [facility] Discharge Instructions   You were seen today by Dr. Zoila Shutter She went over your recent lab work, everything looked good. Keep your appointment with your cardiologist. Please call us if you have any changes or concerns that come up. Return in 6 months for labs and follow up.  Thank you for choosing [place] at [facility] to provide your oncology and hematology care.  To afford each patient quality time with our provider, please arrive at least 15 minutes before your scheduled appointment time.    If you have a lab appointment with the [facility] please come in thru the  Main Entrance and check in at the main information desk  You need to re-schedule your appointment should you arrive 10 or more minutes late.  We strive to give you quality time with our providers, and arriving late affects you and other patients whose appointments are after yours.  Also, if you no show three or more times for appointments you may be dismissed from the clinic at the providers discretion.     Again, thank you for choosing [facility].  Our hope is that these requests will decrease the amount of time that you wait before being seen by our physicians.       _____________________________________________________________  Should you have questions after your visit to [facility], please contact our office at [phone] between the hours of 8:30 a.m. and 4:30 p.m.  Voicemails left after 4:30 p.m. will not be returned until the following business day.  For prescription refill requests, have your pharmacy contact our office.       Resources For Cancer Patients and their Caregivers ? American Cancer Society: Can assist with transportation, wigs, general needs, runs Look Good Feel Better.        8700923478 ? Cancer Care: Provides financial assistance, online support groups, medication/co-pay assistance.   1-800-813-HOPE 562 417 8449) ? [place] Assists [place] cancer patients and their families through emotional , educational and financial support.  718-697-7907 ? [place] DSS Where to apply for food stamps, Medicaid and utility assistance. 478-631-9578 ? [facility]: Transportation to medical appointments. 661-271-1614 ? Social Security Administration: May apply for disability if have a Stage IV cancer. (725)824-7121 956-074-6316 ? [employer], Disability and Transit Services: Assists with nutrition, care and transit needs. [place] Support Programs:   > Cancer Support Group  2nd Tuesday of the month 1pm-2pm, Journey Room   > Creative Journey  3rd Tuesday of the month 1130am-1pm, Journey Room

## 2017-04-17 ENCOUNTER — Other Ambulatory Visit: Payer: Self-pay | Admitting: Thoracic Surgery (Cardiothoracic Vascular Surgery)

## 2017-04-17 DIAGNOSIS — I712 Thoracic aortic aneurysm, without rupture, unspecified: Secondary | ICD-10-CM

## 2017-05-08 NOTE — Progress Notes (Signed)
Diagnosis No diagnosis found.  Staging Cancer Staging No matching staging information was found for the patient.  Assessment and Plan:  1.  DLBCL, stage IV.  Patient completed chemotherapy in 2015.  Patient is here today for follow-up.  He is doing well.  Labs done 04/08/2017 show a white count 5.7 hemoglobin 15.3 platelets 189,000, LDH is 193.  Last CAT scans were in 2017 and were negative for adenopathy.  He remains on observation and will return to clinic in 6 months for repeat labs and follow-up.  He is advised to notify the office if he has any problems prior to that visit.  2.  Hypothyroidism.  He is on Synthroid.  Follow-up with PCP for monitoring.  Interval History:  67 y.o. male who was followed by Dr. Talbert Cage for DLBCL, stage IV. He was previously followed at [facility] by Dr. [name]. He completed 6 cycles of chemotherapy RCHOP in October 2015.   Last scans done 2017 negative for adenopathy.    Current Status:  Pt is seen today for follow-up to go over labs.  He is doing well and denies any fever, chills, night sweats, anorexia, fatigue, and has noted no adenopathy    DLBCL (diffuse large B cell lymphoma) ([place])   07/07/2013 Initial Biopsy    CT guided right iliac bone marrow aspiration and core biopsy.      07/07/2013 Pathology Results    Bone Marrow Flow Cytometry - PREDOMINANCE OF T-LYMPHOCYTES WITH NONSPECIFIC REVERSAL OF THE CD4:CD8 RATIO. - NO MONOCLONAL B-CELL POPULATION IDENTIFIED. Bone Marrow, Aspirate,Biopsy, and Clot, right iliac - SLIGHTLY HYPERCELLULAR BONE MARROW FOR AGE WITH TRILINEAGE HEMATOPOIESIS. - NEGATIVE FOR LYMPHOMA - SEE COMMENT. PERIPHERAL BLOOD: - NORMOCYTIC-NORMOCHROMIC ANEMIA. - NEUTROPHILIC LEFT SHIFT.      07/13/2013 - 10/27/2013 Chemotherapy    R-CHOP x 6 cycles by Dr. [name] at [facility]       09/01/2013 PET scan    Minimal metabolic activity associated with the splenic lesion. Otherwise complete response to chemotherapy. No residual  hypermetabolic lymph nodes in the skullbase to thigh PET scan. Marked reduction in  lingular consolidation      09/01/2013 PET scan    1. Minimal metabolic activity associated with the splenic lesion. Otherwise complete response to chemotherapy. 1. No residual hypermetabolic lymph nodes in the skullbase to thigh PET scan.  2. Marked reduction in  lingular consolidation      11/28/2013 PET scan    1. Today's study is very similar to prior study from 09/01/2013. Specifically, the ill-defined low-attenuation lesion in the anterior aspect of the spleen is slightly smaller, currently measuring 2.1 x 2.3 cm, and continues to demonstrate some very low-level metabolic activity (SUVmax = 2.9). 2. There is a persistent mass-like opacity in the posterior aspect of the left upper lobe, which also appears slightly smaller than the prior examination. This too demonstrates some low-level metabolic activity (SUVmax = 2.7). While this may simply represent a resolving post infectious or inflammatory scar, if there is any clinical concern that the opacity on the original study from 06/09/2013 that this was in fact a lymphomatous infiltrate, this could represent a residual focus of disease. 3. No new foci of disease noted in the neck, chest, abdomen or pelvis. 4. Atherosclerosis, including left main and 3 vessel coronary artery disease. Please note that although the presence of coronary artery calcium documents the presence of coronary artery disease, the severity of this disease and any potential stenosis cannot be assessed on this non-gated CT examination. Assessment  for potential risk factor modification, dietary therapy or pharmacologic therapy may be warranted, if clinically indicated. 5. Mild cardiomegaly.      07/17/2015 Initial Diagnosis    DLBCL (diffuse large B cell lymphoma) ([place])      10/17/2015 Imaging    CT CAP- Stable exam. No evidence for lymphadenopathy in the chest, abdomen, or  pelvis. No new or progressive interval findings. 2. 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        Problem List Patient Active Problem List   Diagnosis Date Noted  . Ascending aortic aneurysm ([place]) [I71.2] 11/27/2015  . DLBCL (diffuse large B cell lymphoma) ([place]) [C83.30] 07/17/2015  . Cannabis abuse [F12.10] 08/10/2012  . Depressive disorder, not elsewhere classified [F32.9] 08/10/2012  . Obsessive-compulsive disorders [F42.9] 08/10/2012  . Adjustment disorder with mixed anxiety and depressed mood [F43.23] 06/04/2012    Past Medical History Past Medical History:  Diagnosis Date  . Anemia   . Aneurysm, aorta, thoracic ([place])   . Anxiety   . Back pain   . Depression   . DLBCL (diffuse large B cell lymphoma) ([place]) 07/17/2015  . Environmental allergies   . History of blood transfusion   . History of bronchitis   . History of chemotherapy   . Hypothyroidism   . Nocturia   . Peripheral vascular disease ([place])   . Urinary frequency     Past Surgical History Past Surgical History:  Procedure Laterality Date  . BONE MARROW BIOPSY    . HERNIA REPAIR     umblical times 2; inguinal (left)   . IR REMOVAL TUN ACCESS W/ PORT W/O FL MOD SED  07/08/2016  . port a cath placement    . RHINOPLASTY      Family History Family History  Problem Relation Age of Onset  . Alcohol abuse Father   . Depression Sister   . Anxiety disorder Sister   . Depression Sister   . Anxiety disorder Sister      Social History  reports that he has never smoked. He has never used smokeless tobacco. He reports that he drinks about 3.6 oz of alcohol per week. He reports that he does not use drugs.  Medications  Current Outpatient Medications:  .  acetaminophen (TYLENOL) 500 MG tablet, Take 1,000 mg by mouth every 6 (six) hours as needed for mild pain., Disp: , Rfl:  .  chlorpheniramine (CHLOR-TRIMETON) 4 MG tablet, Take by mouth., Disp: , Rfl:  .  clonazePAM (KLONOPIN) 1  MG tablet, 1 bid, Disp: 60 tablet, Rfl: 3 .  diphenhydrAMINE (BENADRYL) 25 mg capsule, Take 25 mg by mouth every 6 (six) hours as needed for allergies or sleep., Disp: , Rfl:  .  levothyroxine (SYNTHROID, LEVOTHROID) 50 MCG tablet, Take 50 mcg by mouth daily before breakfast. , Disp: , Rfl:  .  metoprolol succinate (TOPROL XL) 25 MG 24 hr tablet, Take 1 tablet (25 mg total) by mouth daily., Disp: 30 tablet, Rfl: 5 .  valACYclovir (VALTREX) 500 MG tablet, Take 500 mg by mouth daily as needed (Herpes Virus)., Disp: , Rfl:  .  zolpidem (AMBIEN) 10 MG tablet, Take 1 tablet (10 mg total) by mouth at bedtime., Disp: 30 tablet, Rfl: 4  Allergies Flomax [tamsulosin hcl]  Review of Systems Review of Systems - Oncology ROS as per HPI otherwise 12 point ROS is negative.   Physical Exam  Vitals Wt Readings from Last 3 Encounters:  04/08/17 195 lb (88.5 kg)  11/11/16 197  lb (89.4 kg)  09/15/16 193 lb (87.5 kg)   Temp Readings from Last 3 Encounters:  04/08/17 97.6 F (36.4 C) (Oral)  07/08/16 98.6 F (37 C) (Oral)  05/16/16 97.9 F (36.6 C) (Oral)   BP Readings from Last 3 Encounters:  04/08/17 132/79  11/11/16 (!) 145/93  09/15/16 (!) 143/88   Pulse Readings from Last 3 Encounters:  04/08/17 66  11/11/16 62  09/15/16 70   Constitutional: Well-developed, well-nourished, and in no distress.   HENT: Head: Normocephalic and atraumatic.  Mouth/Throat: No oropharyngeal exudate. Mucosa moist. Eyes: Pupils are equal, round, and reactive to light. Conjunctivae are normal. No scleral icterus.  Neck: Normal range of motion. Neck supple. No JVD present.  Cardiovascular: Normal rate, regular rhythm and normal heart sounds.  Exam reveals no gallop and no friction rub.   No murmur heard. Pulmonary/Chest: Effort normal and breath sounds normal. No respiratory distress. No wheezes.No rales.  Abdominal: Soft. Bowel sounds are normal. No distension. There is no tenderness. There is no guarding.   Musculoskeletal: No edema or tenderness.  Lymphadenopathy: No cervical, axillary or supraclavicular adenopathy.  Neurological: Alert and oriented to person, place, and time. No cranial nerve deficit.  Skin: Skin is warm and dry. No rash noted. No erythema. No pallor.  Psychiatric: Affect and judgment normal.   Labs Appointment on 04/08/2017  Component Date Value Ref Range Status  . WBC 04/08/2017 5.7  4.0 - 10.5 K/uL Final  . RBC 04/08/2017 4.62  4.22 - 5.81 MIL/uL Final  . Hemoglobin 04/08/2017 15.3  13.0 - 17.0 g/dL Final  . HCT 04/08/2017 43.7  39.0 - 52.0 % Final  . MCV 04/08/2017 94.6  78.0 - 100.0 fL Final  . MCH 04/08/2017 33.1  26.0 - 34.0 pg Final  . MCHC 04/08/2017 35.0  30.0 - 36.0 g/dL Final  . RDW 04/08/2017 12.9  11.5 - 15.5 % Final  . Platelets 04/08/2017 189  150 - 400 K/uL Final  . Neutrophils Relative % 04/08/2017 44  % Final  . Neutro Abs 04/08/2017 2.5  1.7 - 7.7 K/uL Final  . Lymphocytes Relative 04/08/2017 36  % Final  . Lymphs Abs 04/08/2017 2.0  0.7 - 4.0 K/uL Final  . Monocytes Relative 04/08/2017 8  % Final  . Monocytes Absolute 04/08/2017 0.5  0.1 - 1.0 K/uL Final  . Eosinophils Relative 04/08/2017 10  % Final  . Eosinophils Absolute 04/08/2017 0.6  0.0 - 0.7 K/uL Final  . Basophils Relative 04/08/2017 2  % Final  . Basophils Absolute 04/08/2017 0.1  0.0 - 0.1 K/uL Final   Performed at [facility], [address], [place], [place] [address]  . Sodium 04/08/2017 137  135 - 145 mmol/L Final  . Potassium 04/08/2017 3.8  3.5 - 5.1 mmol/L Final  . Chloride 04/08/2017 101  101 - 111 mmol/L Final  . CO2 04/08/2017 26  22 - 32 mmol/L Final  . Glucose, Bld 04/08/2017 135* 65 - 99 mg/dL Final  . BUN 04/08/2017 16  6 - 20 mg/dL Final  . Creatinine, Ser 04/08/2017 0.93  0.61 - 1.24 mg/dL Final  . Calcium 04/08/2017 9.8  8.9 - 10.3 mg/dL Final  . Total Protein 04/08/2017 7.3  6.5 - 8.1 g/dL Final  . Albumin 04/08/2017 4.3  3.5 - 5.0 g/dL Final  . AST 04/08/2017  30  15 - 41 U/L Final  . ALT 04/08/2017 24  17 - 63 U/L Final  . Alkaline Phosphatase 04/08/2017 40  38 -  126 U/L Final  . Total Bilirubin 04/08/2017 1.1  0.3 - 1.2 mg/dL Final  . GFR calc non Af Amer 04/08/2017 >60  >60 mL/min Final  . GFR calc Af Amer 04/08/2017 >60  >60 mL/min Final   Comment: (NOTE) The eGFR has been calculated using the CKD EPI equation. This calculation has not been validated in all clinical situations. eGFR's persistently <60 mL/min signify possible Chronic Kidney Disease.   Georgiann Hahn gap 04/08/2017 10  5 - 15 Final   Performed at [facility], [address], [place], [place] [address]  . LDH 04/08/2017 193* 98 - 192 U/L Final   Performed at [facility], [address], [place], [place] [address]     Pathology No orders of the defined types were placed in this encounter.      Zoila Shutter MD

## 2017-05-12 ENCOUNTER — Ambulatory Visit
Admission: RE | Admit: 2017-05-12 | Discharge: 2017-05-12 | Disposition: A | Discharge status: Home / Self Care | Payer: Medicare Other | Source: Ambulatory Visit | Attending: Thoracic Surgery (Cardiothoracic Vascular Surgery) | Admitting: Thoracic Surgery (Cardiothoracic Vascular Surgery)

## 2017-05-12 ENCOUNTER — Ambulatory Visit (INDEPENDENT_AMBULATORY_CARE_PROVIDER_SITE_OTHER): Payer: Medicare Other | Admitting: Thoracic Surgery (Cardiothoracic Vascular Surgery)

## 2017-05-12 ENCOUNTER — Other Ambulatory Visit: Payer: Self-pay

## 2017-05-12 ENCOUNTER — Encounter: Payer: Self-pay | Admitting: Thoracic Surgery (Cardiothoracic Vascular Surgery)

## 2017-05-12 VITALS — BP 132/81 | HR 67 | Resp 18 | Ht 73.0 in | Wt 197.0 lb

## 2017-05-12 DIAGNOSIS — I712 Thoracic aortic aneurysm, without rupture, unspecified: Secondary | ICD-10-CM

## 2017-05-12 DIAGNOSIS — I7121 Aneurysm of the ascending aorta, without rupture: Secondary | ICD-10-CM

## 2017-05-12 MED ORDER — ATENOLOL 25 MG PO TABS: 25.0000 mg | ORAL_TABLET | Freq: Every day | ORAL | 6 refills | Status: DC

## 2017-05-12 MED ORDER — IOPAMIDOL (ISOVUE-370) INJECTION 76%
75.0000 mL | Freq: Once | INTRAVENOUS | Status: AC | PRN
  Administered 2017-05-12: 75 mL via INTRAVENOUS

## 2017-05-12 NOTE — Progress Notes (Signed)
[address]       [place] [address]             [phone]     HPI: Douglas Bryant returns for a scheduled follow-up visit  Douglas Bryant is a 67 year old man with a past medical history of non-Hodgkin's lymphoma, back pain, anxiety, and depression.  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  Last year he had a CT as part of his follow-up after treatment for his non-Hodgkin's lymphoma.  He was noted to have a 4.3 cm ascending aneurysm.  The aneurysm had been present on previous CTs but had not been noted in the reports.  I saw him in the office in October.  The aneurysm was reported to be 4.6 cm at that time.  His blood pressure was elevated so I started him on Toprol-XL 25 mg daily.    In the interim since his last visit he says he has been feeling more tired and depressed.  He has less energy.  He thinks that is from the Toprol XL.  Past Medical History:  Diagnosis Date  . Anemia   . Aneurysm, aorta, thoracic ([place])   . Anxiety   . Back pain   . Depression   . DLBCL (diffuse large B cell lymphoma) ([place]) 07/17/2015  . Environmental allergies   . History of blood transfusion   . History of bronchitis   . History of chemotherapy   . Hypothyroidism   . Nocturia   . Peripheral vascular disease ([place])   . Urinary frequency     Current Outpatient Medications  Medication Sig Dispense Refill  . acetaminophen (TYLENOL) 500 MG tablet Take 1,000 mg by mouth every 6 (six) hours as needed for mild pain.    . chlorpheniramine (CHLOR-TRIMETON) 4 MG tablet Take by mouth.    . clonazePAM (KLONOPIN) 1 MG tablet 1 bid 60 tablet 3  . diphenhydrAMINE (BENADRYL) 25 mg capsule Take 25 mg by mouth every 6 (six) hours as needed for allergies or sleep.    Marland Kitchen levothyroxine (SYNTHROID, LEVOTHROID) 50 MCG tablet Take 50 mcg by mouth daily before breakfast.     . valACYclovir (VALTREX) 500 MG tablet Take 500 mg by mouth daily as needed (Herpes  Virus).    . zolpidem (AMBIEN) 10 MG tablet Take 1 tablet (10 mg total) by mouth at bedtime. 30 tablet 4  . atenolol (TENORMIN) 25 MG tablet Take 1 tablet (25 mg total) by mouth daily. 30 tablet 6   No current facility-administered medications for this visit.     Physical Exam BP 132/81 (BP Location: Right Arm, Patient Position: Sitting, Cuff Size: Normal)   Pulse 67   Resp 18   Ht 6\' 1"  (1.854 m)   Wt 197 lb (89.4 kg)   SpO2 98% Comment: RA  BMI 25.26 kg/m  67 year old man in no acute distress Alert and oriented x3 with no focal deficits Cardiac regular rate and rhythm normal S1 and S2 Lungs clear with equal breath sounds bilaterally No peripheral edema  Diagnostic Tests: CT ANGIOGRAPHY CHEST WITH CONTRAST  TECHNIQUE: Multidetector CT imaging of the chest was performed using the standard protocol during bolus administration of intravenous contrast. Multiplanar CT image reconstructions and MIPs were obtained to evaluate the vascular anatomy.  CONTRAST:  23mL ISOVUE-370 IOPAMIDOL (ISOVUE-370) INJECTION 76%  COMPARISON:  11/11/2016 CT chest  FINDINGS: Cardiovascular: 4.5 x 4.5 cm ascending aortic aneurysm (  series 4, image 79). Minimal calcific atherosclerosis of the aorta. Normal heart size. No pericardial effusion.  Mediastinum/Nodes: 7 mm calcified nodule in the left lobe of the thyroid. No mediastinal lymphadenopathy. Normal thoracic esophagus.  Lungs/Pleura: Stable linear scarring within the left upper lobe atelectasis. No consolidation, effusion, or pneumothorax.  Upper Abdomen: No acute abnormality.  Musculoskeletal: No chest wall abnormality. No acute or significant osseous findings.  Review of the MIP images confirms the above findings.  IMPRESSION: 1. 4.5 cm thoracic aortic aneurysm, 4.6 cm on prior study. Ascending thoracic aortic aneurysm. Recommend semi-annual imaging followup by CTA or MRA and referral to cardiothoracic surgery if not  already obtained. This recommendation follows 2010 ACCF/AHA/AATS/ACR/ASA/SCA/SCAI/SIR/STS/SVM Guidelines for the Diagnosis and Management of Patients With Thoracic Aortic Disease. Circulation. 2010; 121: R427-C623 2. Otherwise stable CTA of the chest.   Electronically Signed   By: Kristine Garbe M.D.   On: 05/12/2017 14:41 I personally reviewed the CT images and concur with the findings noted above  Impression: Douglas Bryant is a 67 year old man with a history of hypertension who was incidentally noted to have an ascending aneurysm on a CT done for follow-up of his non-Hodgkin's lymphoma.  His ascending aorta measures 4.5 cm on today's exam.  There is no indication for surgery at this time but he does need continued follow-up.  I will plan to repeat his scan in 6 months.  Hypertension-blood pressure much improved at this visit.  Unfortunately thinks that the Toprol is causing him to feel fatigued.  His symptoms are very consistent with depression and he has a history of that but he times it almost exactly to starting the Toprol-XL.  I would prefer to have him on a beta-blocker if at all possible.  I am going to try atenolol 25 mg daily and stop the Toprol.  If he has similar effects with the atenolol will have to go to a different class of medications.  Plan: DC metoprolol  Atenolol 25 mg daily  Return in 6 months with CT angio of chest  Melrose Nakayama, MD [facility] [phone]

## 2017-05-25 DIAGNOSIS — I711 Thoracic aortic aneurysm, ruptured: Secondary | ICD-10-CM | POA: Diagnosis not present

## 2017-05-25 DIAGNOSIS — M549 Dorsalgia, unspecified: Secondary | ICD-10-CM | POA: Diagnosis not present

## 2017-05-25 DIAGNOSIS — M461 Sacroiliitis, not elsewhere classified: Secondary | ICD-10-CM | POA: Diagnosis not present

## 2017-05-25 DIAGNOSIS — Z299 Encounter for prophylactic measures, unspecified: Secondary | ICD-10-CM | POA: Diagnosis not present

## 2017-05-25 DIAGNOSIS — Z6826 Body mass index (BMI) 26.0-26.9, adult: Secondary | ICD-10-CM | POA: Diagnosis not present

## 2017-05-25 DIAGNOSIS — C8590 Non-Hodgkin lymphoma, unspecified, unspecified site: Secondary | ICD-10-CM | POA: Diagnosis not present

## 2017-05-25 DIAGNOSIS — I10 Essential (primary) hypertension: Secondary | ICD-10-CM | POA: Diagnosis not present

## 2017-05-25 DIAGNOSIS — Z789 Other specified health status: Secondary | ICD-10-CM | POA: Diagnosis not present

## 2017-05-25 DIAGNOSIS — F329 Major depressive disorder, single episode, unspecified: Secondary | ICD-10-CM | POA: Diagnosis not present

## 2017-06-09 ENCOUNTER — Encounter (HOSPITAL_COMMUNITY): Payer: Self-pay | Admitting: Licensed Clinical Social Worker

## 2017-06-09 ENCOUNTER — Ambulatory Visit (INDEPENDENT_AMBULATORY_CARE_PROVIDER_SITE_OTHER): Payer: Medicare Other | Admitting: Licensed Clinical Social Worker

## 2017-06-09 DIAGNOSIS — F341 Dysthymic disorder: Secondary | ICD-10-CM | POA: Diagnosis not present

## 2017-06-09 NOTE — Progress Notes (Signed)
   THERAPIST PROGRESS NOTE  Session Time: 9:00 am -9:50 am  Participation Level: Active  Behavioral Response: CasualAlertAnxious  Type of Therapy: Individual Therapy  Treatment Goals addressed: Anxiety  Interventions: CBT and Solution Focused  Summary: Douglas Bryant is a 67 y.o. male who presents  that presents oriented x5 (person, place, situation, time and object), alert, anxious, casually dressed, appropriately groomed, and cooperative to address mood and anxiety. Patient has a history of medical treatment including lymphoma. Patient has a history of mental health treatment including outpatient therapy and medication management. Patient denies symptoms of mania. He admits to passive thoughts of suicide but denies a plan and means. Patient denies homicidal ideations. Patient denies psychosis including auditory and visual hallucination. Patient denies substance abuse. Patient is at low risk for lethality at this time.   Physically: Patient has stopped taking his blood pressure medicine but sees the importance of it. He plans to get back on it. He reports low libido.  Spiritually/values: No issues identified.  Relationships: Patient had an argument with his girlfriend on Easter and has not been in contact with her since. He feels like he doesn't want to be in the relationship because he always has to explain himself and feels like she is always on him. He also stated that they do get along and there are good aspects of the relationship. Patient was asked to consider what would it take to make the relationship work before he makes a decision to end things.  Emotional/Mental/Behavior: Patient an ok mood. He has not been as depressed as he has in the past. His medications have impacted his energy level which impacts his mood.   Patient engaged in session. He responded well to interventions. 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  Patient will continue in outpatient therapy due to being the least restrictive service to meet his needs. Patient made minimal  progress on his goal at this time.   Suicidal/Homicidal: Negativewithout intent/plan  Therapist Response: Therapist reviewed patient's recent thoughts and behaviors. Therapist utilized CBT to address anxiety and mood. Therapist processed patient's feelings to identify triggers for mood. Therapist discussed patient's relationship with his girlfriend and the pros and con's of staying in the relationship.   Plan: Return again in 3 weeks. Therapist will review patient goals on or before 04.11.2019  Diagnosis: Axis I: Dysthymic Disorder    Axis II: No diagnosis    Glori Bickers, LCSW 06/09/2017

## 2017-06-17 ENCOUNTER — Ambulatory Visit (INDEPENDENT_AMBULATORY_CARE_PROVIDER_SITE_OTHER): Payer: Medicare Other | Admitting: Psychiatry

## 2017-06-17 ENCOUNTER — Encounter (HOSPITAL_COMMUNITY): Payer: Self-pay | Admitting: Psychiatry

## 2017-06-17 VITALS — BP 129/78 | HR 78 | Ht 73.0 in | Wt 193.0 lb

## 2017-06-17 DIAGNOSIS — Z811 Family history of alcohol abuse and dependence: Secondary | ICD-10-CM

## 2017-06-17 DIAGNOSIS — F341 Dysthymic disorder: Secondary | ICD-10-CM | POA: Diagnosis not present

## 2017-06-17 DIAGNOSIS — Z818 Family history of other mental and behavioral disorders: Secondary | ICD-10-CM

## 2017-06-17 MED ORDER — CLONAZEPAM 1 MG PO TABS
ORAL_TABLET | ORAL | 4 refills | Status: DC
Start: 2017-06-17 — End: 2017-10-07

## 2017-06-17 MED ORDER — ZOLPIDEM TARTRATE 10 MG PO TABS: 10.0000 mg | ORAL_TABLET | Freq: Every day | ORAL | 4 refills | Status: DC

## 2017-06-17 NOTE — Progress Notes (Signed)
[name], male   DOB: 1950/05/24, 67 y.o.   MRN: [id number] [facility] MD Progress Note  06/17/2017 4:12 PM Douglas Bryant  MRN:  [id number] Subjective:  Mild depression Principal Problem: Dysthymic disorder Today the patient is stable. Is making some decisions about his girlfriend in [place]. His girlfriend's son is apparently a problem the patient has difficulty with him. The girlfriend always comes with her son is now about 49 years old. The patient is in a stable position but is not moving upper down. The good news is he sees his therapist about every month. They work on a few issues. His anxiety is relatively well controlled on a relatively low-dose of Klonopin 1 mg in the morning and 1 mg later in the day if necessary. He doesn't a little marijuana every day which does seem to help his anxiety. He denies daily persistent depression. Generally he is sleeping and eating well.The patient denies being suicidal. He denies the use of alcohol. He denies the use of any other substances other than some marijuana. He's been smoking marijuana for over a decade. The patient is been retired for about 6 or 7 years. He is comfortable where he lives. He does all his institutional ADLs. He shows no evidence of abuse of anything. Past Medical History:  Past Medical History:  Diagnosis Date  . Anemia   . Aneurysm, aorta, thoracic ([place])   . Anxiety   . Back pain   . Depression   . DLBCL (diffuse large B cell lymphoma) ([place]) 07/17/2015  . Environmental allergies   . History of blood transfusion   . History of bronchitis   . History of chemotherapy   . Hypothyroidism   . Nocturia   . Peripheral vascular disease ([place])   . Urinary frequency     Past Surgical History:  Procedure Laterality Date  . BONE MARROW BIOPSY    . HERNIA REPAIR     umblical times 2; inguinal (left)   . IR REMOVAL TUN ACCESS W/ PORT W/O FL MOD SED  07/08/2016  . port a cath placement    . RHINOPLASTY     Family  History:  Family History  Problem Relation Age of Onset  . Alcohol abuse Father   . Depression Sister   . Anxiety disorder Sister   . Depression Sister   . Anxiety disorder Sister    Family Psychiatric  History:  Social History:  Social History   Substance and Sexual Activity  Alcohol Use Yes  . Alcohol/week: 3.6 oz  . Types: 6 Cans of beer per week   Comment: beer occas     Social History   Substance and Sexual Activity  Drug Use No  . Frequency: 2.0 times per week  . Types: Marijuana   Comment: smokes daily for thirty years. currently takes one puff    Social History   Socioeconomic History  . Marital status: Divorced    Spouse name: Not on file  . Number of children: Not on file  . Years of education: Not on file  . Highest education level: Not on file  Occupational History  . Not on file  Social Needs  . Financial resource strain: Not on file  . Food insecurity:    Worry: Not on file    Inability: Not on file  . Transportation needs:    Medical: Not on file    Non-medical: Not on file  Tobacco Use  . Smoking status: Never Smoker  .  Smokeless tobacco: Never Used  Substance and Sexual Activity  . Alcohol use: Yes    Alcohol/week: 3.6 oz    Types: 6 Cans of beer per week    Comment: beer occas  . Drug use: No    Frequency: 2.0 times per week    Types: Marijuana    Comment: smokes daily for thirty years. currently takes one puff  . Sexual activity: Yes    Partners: Female    Birth control/protection: Condom  Lifestyle  . Physical activity:    Days per week: Not on file    Minutes per session: Not on file  . Stress: Not on file  Relationships  . Social connections:    Talks on phone: Not on file    Gets together: Not on file    Attends religious service: Not on file    Active member of club or organization: Not on file    Attends meetings of clubs or organizations: Not on file    Relationship status: Not on file  Other Topics Concern  . Not on  file  Social History Narrative  . Not on file   Additional Social History:                         Sleep: Fair  Appetite:  Good  Current Medications: Current Outpatient Medications  Medication Sig Dispense Refill  . acetaminophen (TYLENOL) 500 MG tablet Take 1,000 mg by mouth every 6 (six) hours as needed for mild pain.    . chlorpheniramine (CHLOR-TRIMETON) 4 MG tablet Take by mouth.    . clonazePAM (KLONOPIN) 1 MG tablet 1 bid 60 tablet 4  . diphenhydrAMINE (BENADRYL) 25 mg capsule Take 25 mg by mouth every 6 (six) hours as needed for allergies or sleep.    Marland Kitchen levothyroxine (SYNTHROID, LEVOTHROID) 50 MCG tablet Take 50 mcg by mouth daily before breakfast.     . valACYclovir (VALTREX) 500 MG tablet Take 500 mg by mouth daily as needed (Herpes Virus).    . zolpidem (AMBIEN) 10 MG tablet Take 1 tablet (10 mg total) by mouth at bedtime. 30 tablet 4  . atenolol (TENORMIN) 25 MG tablet Take 1 tablet (25 mg total) by mouth daily. 30 tablet 6   No current facility-administered medications for this visit.     Lab Results: No results found for this or any previous visit (from the past 48 hour(s)).  Blood Alcohol level:  No results found for: [facility]  Physical Findings: AIMS:  , ,  ,  ,    CIWA:    COWS:     Musculoskeletal: Strength & Muscle Tone: within normal limits Gait & Station: normal Patient leans: N/A  Psychiatric Specialty Exam: ROS  Blood pressure 129/78, pulse 78, height '6\' 1"'$  (1.854 m), weight 193 lb (87.5 kg), SpO2 97 %.Body mass index is 25.46 kg/m.  General Appearance: Casual  Eye Contact::  Good  Speech:  Clear and Coherent  Volume:  Normal  Mood:  Euthymic  Affect:  Congruent  Thought Process:  Coherent  Orientation:  Full (Time, Place, and Person)  Thought Content:  WDL  Suicidal Thoughts:  No  Homicidal Thoughts:  No  Memory:  NA  Judgement:  Good  Insight:  Fair  Psychomotor Activity:  Normal  Concentration:  Good  Recall:  Good  Fund of  Knowledge:Good  Language: Fair  Akathisia:  No  Handed:  Right  AIMS (if indicated):  Assets:  Desire for Improvement  ADL's:  Intact  Cognition: WNL  Sleep:      Treatment Plan Summary: 06/17/2017,  At this time the patient is very stable. His first problem is actually the fact that he has a thoracic aneurysm. The patient recently started on blood pressure medicines. The second problem is that he has chronic dysthymic disorder. It is associated with significant anxiety which is well-controlled with some low-dose Klonopin. He sleeps well when Ambien. This patient is very stable and return to see me in approximately 4 months.

## 2017-09-29 DIAGNOSIS — H2513 Age-related nuclear cataract, bilateral: Secondary | ICD-10-CM | POA: Diagnosis not present

## 2017-10-01 ENCOUNTER — Other Ambulatory Visit (HOSPITAL_COMMUNITY): Payer: Self-pay

## 2017-10-01 DIAGNOSIS — C8330 Diffuse large B-cell lymphoma, unspecified site: Secondary | ICD-10-CM

## 2017-10-05 ENCOUNTER — Inpatient Hospital Stay (HOSPITAL_COMMUNITY): Payer: Medicare Other | Attending: Hematology

## 2017-10-05 DIAGNOSIS — R5383 Other fatigue: Secondary | ICD-10-CM | POA: Diagnosis not present

## 2017-10-05 DIAGNOSIS — C8330 Diffuse large B-cell lymphoma, unspecified site: Secondary | ICD-10-CM | POA: Insufficient documentation

## 2017-10-05 DIAGNOSIS — Z1211 Encounter for screening for malignant neoplasm of colon: Secondary | ICD-10-CM | POA: Diagnosis not present

## 2017-10-05 DIAGNOSIS — Z1331 Encounter for screening for depression: Secondary | ICD-10-CM | POA: Diagnosis not present

## 2017-10-05 DIAGNOSIS — Z7189 Other specified counseling: Secondary | ICD-10-CM | POA: Diagnosis not present

## 2017-10-05 DIAGNOSIS — Z299 Encounter for prophylactic measures, unspecified: Secondary | ICD-10-CM | POA: Diagnosis not present

## 2017-10-05 DIAGNOSIS — I10 Essential (primary) hypertension: Secondary | ICD-10-CM | POA: Diagnosis not present

## 2017-10-05 DIAGNOSIS — Z125 Encounter for screening for malignant neoplasm of prostate: Secondary | ICD-10-CM | POA: Diagnosis not present

## 2017-10-05 DIAGNOSIS — Z0000 Encounter for general adult medical examination without abnormal findings: Secondary | ICD-10-CM | POA: Diagnosis not present

## 2017-10-05 DIAGNOSIS — Z79899 Other long term (current) drug therapy: Secondary | ICD-10-CM | POA: Diagnosis not present

## 2017-10-05 DIAGNOSIS — E039 Hypothyroidism, unspecified: Secondary | ICD-10-CM | POA: Diagnosis not present

## 2017-10-05 DIAGNOSIS — Z6826 Body mass index (BMI) 26.0-26.9, adult: Secondary | ICD-10-CM | POA: Diagnosis not present

## 2017-10-05 DIAGNOSIS — E7800 Pure hypercholesterolemia, unspecified: Secondary | ICD-10-CM | POA: Diagnosis not present

## 2017-10-05 DIAGNOSIS — Z1339 Encounter for screening examination for other mental health and behavioral disorders: Secondary | ICD-10-CM | POA: Diagnosis not present

## 2017-10-05 LAB — CBC WITH DIFFERENTIAL/PLATELET
Basophils Absolute: 0.1 10*3/uL (ref 0.0–0.1)
Basophils Relative: 2 %
EOS PCT: 9 %
Eosinophils Absolute: 0.5 10*3/uL (ref 0.0–0.7)
HEMATOCRIT: 40.8 % (ref 39.0–52.0)
Hemoglobin: 14.2 g/dL (ref 13.0–17.0)
LYMPHS ABS: 2 10*3/uL (ref 0.7–4.0)
LYMPHS PCT: 35 %
MCH: 33.4 pg (ref 26.0–34.0)
MCHC: 34.8 g/dL (ref 30.0–36.0)
MCV: 96 fL (ref 78.0–100.0)
MONO ABS: 0.5 10*3/uL (ref 0.1–1.0)
Monocytes Relative: 8 %
Neutro Abs: 2.6 10*3/uL (ref 1.7–7.7)
Neutrophils Relative %: 46 %
PLATELETS: 153 10*3/uL (ref 150–400)
RBC: 4.25 MIL/uL (ref 4.22–5.81)
RDW: 12.9 % (ref 11.5–15.5)
WBC: 5.7 10*3/uL (ref 4.0–10.5)

## 2017-10-05 LAB — COMPREHENSIVE METABOLIC PANEL
ALT: 21 U/L (ref 0–44)
AST: 26 U/L (ref 15–41)
Albumin: 4.2 g/dL (ref 3.5–5.0)
Alkaline Phosphatase: 30 U/L — ABNORMAL LOW (ref 38–126)
Anion gap: 5 (ref 5–15)
BILIRUBIN TOTAL: 1.3 mg/dL — AB (ref 0.3–1.2)
BUN: 13 mg/dL (ref 8–23)
CHLORIDE: 105 mmol/L (ref 98–111)
CO2: 27 mmol/L (ref 22–32)
CREATININE: 0.9 mg/dL (ref 0.61–1.24)
Calcium: 8.8 mg/dL — ABNORMAL LOW (ref 8.9–10.3)
GFR calc non Af Amer: >60 mL/min (ref >60)
Glucose, Bld: 85 mg/dL (ref 70–99)
POTASSIUM: 3.8 mmol/L (ref 3.5–5.1)
Sodium: 137 mmol/L (ref 135–145)
TOTAL PROTEIN: 7.1 g/dL (ref 6.5–8.1)

## 2017-10-05 LAB — LACTATE DEHYDROGENASE: LDH: 197 U/L — AB (ref 98–192)

## 2017-10-07 ENCOUNTER — Encounter (HOSPITAL_COMMUNITY): Payer: Self-pay | Admitting: Psychiatry

## 2017-10-07 ENCOUNTER — Ambulatory Visit (INDEPENDENT_AMBULATORY_CARE_PROVIDER_SITE_OTHER): Payer: Medicare Other | Admitting: Psychiatry

## 2017-10-07 ENCOUNTER — Other Ambulatory Visit: Payer: Self-pay | Admitting: Thoracic Surgery (Cardiothoracic Vascular Surgery)

## 2017-10-07 VITALS — BP 132/80 | HR 74 | Ht 73.0 in | Wt 197.0 lb

## 2017-10-07 DIAGNOSIS — I7121 Aneurysm of the ascending aorta, without rupture: Secondary | ICD-10-CM

## 2017-10-07 DIAGNOSIS — I712 Thoracic aortic aneurysm, without rupture: Secondary | ICD-10-CM

## 2017-10-07 DIAGNOSIS — F341 Dysthymic disorder: Secondary | ICD-10-CM

## 2017-10-07 MED ORDER — CLONAZEPAM 1 MG PO TABS: ORAL_TABLET | ORAL | 4 refills | Status: DC

## 2017-10-07 MED ORDER — ZOLPIDEM TARTRATE 10 MG PO TABS: 10.0000 mg | ORAL_TABLET | Freq: Every day | ORAL | 4 refills | Status: DC

## 2017-10-07 NOTE — Progress Notes (Signed)
[name], male   DOB: 1950/11/28, 67 y.o.   MRN: [id number] [facility] MD Progress Note  10/07/2017 2:09 PM Douglas Bryant  MRN:  [id number] Subjective:  Mild depression Principal Problem: Dysthymic disorder  At this time the patient is at his baseline. His mood is stable. He is playing more music which is good. He sleeping as long as he takes his sleeping medicine. His appetite is good. He denies any problems with his energy. He's thinking concentrating fairly well. His biggest issues related to his aneurysm and controlling his blood pressure. His primary care doctor/cardiologist and made a variety changes in his hypertensive medications. Blood pressure is good today. The patient denies any chest pain or shortness of breath. Is no dizziness. He denies psychotic symptoms. He denies the use of alcohol smokes marijuana about once a week. The patient presently is still in therapy but only goes about once a month. 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 He wonders about outcomes of some of the patient's feet in the past. I do not this is a big issue for him. I think this is chronic feature of worry about the past. Patient has good eye contact is casually dressed. He denies a sense of worthlessness. He certainly is not suicidal. Past Medical History:  Past Medical History:  Diagnosis Date  . Anemia   . Aneurysm, aorta, thoracic ([place])   . Anxiety   . Back pain   . Depression   . DLBCL (diffuse large B cell lymphoma) ([place]) 07/17/2015  . Environmental allergies   . History of blood transfusion   . History of bronchitis   . History of chemotherapy   . Hypothyroidism   . Nocturia   . Peripheral vascular disease ([place])   . Urinary frequency     Past Surgical History:  Procedure Laterality Date  . BONE MARROW BIOPSY    . HERNIA REPAIR     umblical times 2; inguinal (left)   . IR REMOVAL TUN ACCESS W/ PORT W/O  FL MOD SED  07/08/2016  . port a cath placement    . RHINOPLASTY     Family History:  Family History  Problem Relation Age of Onset  . Alcohol abuse Father   . Depression Sister   . Anxiety disorder Sister   . Depression Sister   . Anxiety disorder Sister    Family Psychiatric  History:  Social History:  Social History   Substance and Sexual Activity  Alcohol Use Yes  . Alcohol/week: 6.0 standard drinks  . Types: 6 Cans of beer per week   Comment: beer occas     Social History   Substance and Sexual Activity  Drug Use No  . Frequency: 2.0 times per week  . Types: Marijuana   Comment: smokes daily for thirty years. currently takes one puff    Social History   Socioeconomic History  . Marital status: Divorced    Spouse name: Not on file  . Number of children: Not on file  . Years of education: Not on file  . Highest education level: Not on file  Occupational History  . Not on file  Social Needs  . Financial resource strain: Not on file  . Food insecurity:    Worry: Not on file    Inability: Not on file  . Transportation needs:    Medical: Not on file    Non-medical: Not  on file  Tobacco Use  . Smoking status: Never Smoker  . Smokeless tobacco: Never Used  Substance and Sexual Activity  . Alcohol use: Yes    Alcohol/week: 6.0 standard drinks    Types: 6 Cans of beer per week    Comment: beer occas  . Drug use: No    Frequency: 2.0 times per week    Types: Marijuana    Comment: smokes daily for thirty years. currently takes one puff  . Sexual activity: Yes    Partners: Female    Birth control/protection: Condom  Lifestyle  . Physical activity:    Days per week: Not on file    Minutes per session: Not on file  . Stress: Not on file  Relationships  . Social connections:    Talks on phone: Not on file    Gets together: Not on file    Attends religious service: Not on file    Active member of club or organization: Not on file    Attends meetings of  clubs or organizations: Not on file    Relationship status: Not on file  Other Topics Concern  . Not on file  Social History Narrative  . Not on file   Additional Social History:                         Sleep: Fair  Appetite:  Good  Current Medications: Current Outpatient Medications  Medication Sig Dispense Refill  . acetaminophen (TYLENOL) 500 MG tablet Take 1,000 mg by mouth every 6 (six) hours as needed for mild pain.    Marland Kitchen amLODipine (NORVASC) 2.5 MG tablet     . chlorpheniramine (CHLOR-TRIMETON) 4 MG tablet Take by mouth.    . clonazePAM (KLONOPIN) 1 MG tablet 1 bid 60 tablet 4  . diphenhydrAMINE (BENADRYL) 25 mg capsule Take 25 mg by mouth every 6 (six) hours as needed for allergies or sleep.    Marland Kitchen levothyroxine (SYNTHROID, LEVOTHROID) 50 MCG tablet Take 50 mcg by mouth daily before breakfast.     . valACYclovir (VALTREX) 500 MG tablet Take 500 mg by mouth daily as needed (Herpes Virus).    . zolpidem (AMBIEN) 10 MG tablet Take 1 tablet (10 mg total) by mouth at bedtime. 30 tablet 4   No current facility-administered medications for this visit.     Lab Results: No results found for this or any previous visit (from the past 48 hour(s)).  Blood Alcohol level:  No results found for: [facility]  Physical Findings: AIMS:  , ,  ,  ,    CIWA:    COWS:     Musculoskeletal: Strength & Muscle Tone: within normal limits Gait & Station: normal Patient leans: N/A  Psychiatric Specialty Exam: ROS  Blood pressure 132/80, pulse 74, height '6\' 1"'$  (1.854 m), weight 197 lb (89.4 kg).Body mass index is 25.99 kg/m.  General Appearance: Casual  Eye Contact::  Good  Speech:  Clear and Coherent  Volume:  Normal  Mood:mild depression  Affect:  Congruent  Thought Process:  Coherent  Orientation:  Full (Time, Place, and Person)  Thought Content:  WDL  Suicidal Thoughts:  No  Homicidal Thoughts:  No  Memory:  NA  Judgement:  Good  Insight:  Fair  Psychomotor Activity:  Normal   Concentration:  Good  Recall:  Good  Fund of Knowledge:Good  Language: Fair  Akathisia:  No  Handed:  Right  AIMS (if indicated):  Assets:  Desire for Improvement  ADL's:  Intact  Cognition: WNL  Sleep:      Treatment Plan Summary: 10/07/2017,  At this time the patient's first problem is that of dysthymic disorder.treatment she'll be back to therapy were regular basis. He will not take any antidepressants at this time. His second problem is that of adjustment disorder with anxious mood state. At this time is been taking Klonopin on a regular basis and it seems to work optimally at this time. Efforts to reduce atenolol to decline. Therefore I think part of his plan to continue taking Klonopin as long as he shows no evidence of using. He clearly feels a benefit from. His third problem is that of insomnia. This is adequately treated with Ambien together with his Klonopin. Today he spent more than 50% of the time for me use care with this therapist Mr. Jenny Reichmann sheetsin [place]..About the medications and the diagnosis is OCD and how he does not meet that criteria. Is a chronic mild depression disorder with situational anxiety mainly related to financial stress. He says his time taking care of his sister is beneficial for him.this patient she'll return to see me in 4 months.

## 2017-10-07 NOTE — Progress Notes (Signed)
132 80+ 

## 2017-10-09 ENCOUNTER — Ambulatory Visit (HOSPITAL_COMMUNITY): Payer: Self-pay | Admitting: Internal Medicine

## 2017-10-16 ENCOUNTER — Encounter (HOSPITAL_COMMUNITY): Payer: Self-pay | Admitting: Internal Medicine

## 2017-10-16 ENCOUNTER — Inpatient Hospital Stay (HOSPITAL_BASED_OUTPATIENT_CLINIC_OR_DEPARTMENT_OTHER): Payer: Medicare Other | Admitting: Internal Medicine

## 2017-10-16 VITALS — BP 151/90 | HR 63 | Temp 97.8°F | Resp 14 | Wt 195.2 lb

## 2017-10-16 DIAGNOSIS — E039 Hypothyroidism, unspecified: Secondary | ICD-10-CM

## 2017-10-16 DIAGNOSIS — C8330 Diffuse large B-cell lymphoma, unspecified site: Secondary | ICD-10-CM

## 2017-10-16 NOTE — Patient Instructions (Addendum)
[place] at [facility] Discharge Instructions   You saw Dr. Walden Field today. We referred you to GI for a Colonoscopy. You will see [place] back in 1 year.    Thank you for choosing [place] at [facility] to provide your oncology and hematology care.  To afford each patient quality time with our provider, please arrive at least 15 minutes before your scheduled appointment time.   If you have a lab appointment with the [facility] please come in thru the  Main Entrance and check in at the main information desk  You need to re-schedule your appointment should you arrive 10 or more minutes late.  We strive to give you quality time with our providers, and arriving late affects you and other patients whose appointments are after yours.  Also, if you no show three or more times for appointments you may be dismissed from the clinic at the providers discretion.     Again, thank you for choosing [facility].  Our hope is that these requests will decrease the amount of time that you wait before being seen by our physicians.       _____________________________________________________________  Should you have questions after your visit to [facility], please contact our office at [phone] between the hours of 8:00 a.m. and 4:30 p.m.  Voicemails left after 4:00 p.m. will not be returned until the following business day.  For prescription refill requests, have your pharmacy contact our office and allow 72 hours.    Cancer Center Support Programs:   > Cancer Support Group  2nd Tuesday of the month 1pm-2pm, Journey Room

## 2017-10-16 NOTE — Progress Notes (Signed)
Diagnosis Diffuse large B-cell lymphoma, unspecified body region [facility]) - Plan: CBC with Differential/Platelet, Comprehensive metabolic panel, Lactate dehydrogenase  Staging Cancer Staging No matching staging information was found for the patient.  Assessment and Plan:  1.  DLBCL, stage IV.  Patient completed chemotherapy in 2015.  Labs done 10/05/2017 reviewed and showed WBC 5.7 HB 14.2 plts 153,000.  Chemistries WNL with K+ 3.8 Cr 0.90 and normal LFTs.  LDH 197.  Pt will RTC in 04/2018.  He will be 5 years out from therapy in 2020 and at that time he will be assigned to 1 year follow-up.    2.  Aneurysm.  Pt had CTA done 05/12/2017 that showed  IMPRESSION: 1. 4.5 cm thoracic aortic aneurysm, 4.6 cm on prior study. Ascending thoracic aortic aneurysm. Recommend semi-annual imaging followup by CTA or MRA and referral to cardiothoracic surgery if not already obtained. This recommendation follows 2010 ACCF/AHA/AATS/ACR/ASA/SCA/SCAI/SIR/STS/SVM Guidelines for the Diagnosis and Management of Patients With Thoracic Aortic Disease. Circulation. 2010; 121: D408-X448 2. Otherwise stable CTA of the chest.  He is followed by Dr. Roxan Hockey.  He reports some minor left chest wall discomfort.  Follow-up with CT surgery as recommended.    3.  Hypothyroidism.  Pt on Synthroid.  Follow-up with PCP for monitoring.  4.  Health maintenance.  Follow-up with GI as recommended.    Interval History:  67 y.o. male who was followed by Dr. Talbert Cage for DLBCL, stage IV. He was previously followed at [facility] by Dr. [name]. He completed 6 cycles of chemotherapy RCHOP in October 2015.   Last scans done 2017 negative for adenopathy.    Current Status:  Pt is seen today for follow-up.  He reports he has been diagnosed with aneurysm and is followed by Dr. Roxan Hockey.  He denies any fever, chills, night sweats, anorexia, fatigue, and has noted no adenopathy    DLBCL (diffuse large B cell lymphoma) ([place])   07/07/2013  Initial Biopsy    CT guided right iliac bone marrow aspiration and core biopsy.    07/07/2013 Pathology Results    Bone Marrow Flow Cytometry - PREDOMINANCE OF T-LYMPHOCYTES WITH NONSPECIFIC REVERSAL OF THE CD4:CD8 RATIO. - NO MONOCLONAL B-CELL POPULATION IDENTIFIED. Bone Marrow, Aspirate,Biopsy, and Clot, right iliac - SLIGHTLY HYPERCELLULAR BONE MARROW FOR AGE WITH TRILINEAGE HEMATOPOIESIS. - NEGATIVE FOR LYMPHOMA - SEE COMMENT. PERIPHERAL BLOOD: - NORMOCYTIC-NORMOCHROMIC ANEMIA. - NEUTROPHILIC LEFT SHIFT.    1/85/6314 - 10/27/2013 Chemotherapy    R-CHOP x 6 cycles by Dr. [name] at [facility]     09/01/2013 PET scan    Minimal metabolic activity associated with the splenic lesion. Otherwise complete response to chemotherapy. No residual hypermetabolic lymph nodes in the skullbase to thigh PET scan. Marked reduction in  lingular consolidation    09/01/2013 PET scan    1. Minimal metabolic activity associated with the splenic lesion. Otherwise complete response to chemotherapy. 1. No residual hypermetabolic lymph nodes in the skullbase to thigh PET scan.  2. Marked reduction in  lingular consolidation    11/28/2013 PET scan    1. Today's study is very similar to prior study from 09/01/2013. Specifically, the ill-defined low-attenuation lesion in the anterior aspect of the spleen is slightly smaller, currently measuring 2.1 x 2.3 cm, and continues to demonstrate some very low-level metabolic activity (SUVmax = 2.9). 2. There is a persistent mass-like opacity in the posterior aspect of the left upper lobe, which also appears slightly smaller than the prior examination. This too demonstrates some low-level metabolic activity (  SUVmax = 2.7). While this may simply represent a resolving post infectious or inflammatory scar, if there is any clinical concern that the opacity on the original study from 06/09/2013 that this was in fact a lymphomatous infiltrate, this could represent  a residual focus of disease. 3. No new foci of disease noted in the neck, chest, abdomen or pelvis. 4. Atherosclerosis, including left main and 3 vessel coronary artery disease. Please note that although the presence of coronary artery calcium documents the presence of coronary artery disease, the severity of this disease and any potential stenosis cannot be assessed on this non-gated CT examination. Assessment for potential risk factor modification, dietary therapy or pharmacologic therapy may be warranted, if clinically indicated. 5. Mild cardiomegaly.    07/17/2015 Initial Diagnosis    DLBCL (diffuse large B cell lymphoma) ([place])    10/17/2015 Imaging    CT CAP- Stable exam. No evidence for lymphadenopathy in the chest, abdomen, or pelvis. No new or progressive interval findings. 2. 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      Problem List Patient Active Problem List   Diagnosis Date Noted  . Ascending aortic aneurysm ([place]) [I71.2] 11/27/2015  . DLBCL (diffuse large B cell lymphoma) ([place]) [C83.30] 07/17/2015  . Cannabis abuse [F12.10] 08/10/2012  . Depressive disorder, not elsewhere classified [F32.9] 08/10/2012  . Obsessive-compulsive disorders [F42.9] 08/10/2012  . Adjustment disorder with mixed anxiety and depressed mood [F43.23] 06/04/2012    Past Medical History Past Medical History:  Diagnosis Date  . Anemia   . Aneurysm, aorta, thoracic ([place])   . Anxiety   . Back pain   . Depression   . DLBCL (diffuse large B cell lymphoma) ([place]) 07/17/2015  . Environmental allergies   . History of blood transfusion   . History of bronchitis   . History of chemotherapy   . Hypothyroidism   . Nocturia   . Peripheral vascular disease ([place])   . Urinary frequency     Past Surgical History Past Surgical History:  Procedure Laterality Date  . BONE MARROW BIOPSY    . HERNIA REPAIR     umblical times 2; inguinal (left)   . IR REMOVAL TUN ACCESS W/  PORT W/O FL MOD SED  07/08/2016  . port a cath placement    . RHINOPLASTY      Family History Family History  Problem Relation Age of Onset  . Alcohol abuse Father   . Depression Sister   . Anxiety disorder Sister   . Depression Sister   . Anxiety disorder Sister      Social History  reports that he has never smoked. He has never used smokeless tobacco. He reports that he drinks about 6.0 standard drinks of alcohol per week. He reports that he does not use drugs.  Medications  Current Outpatient Medications:  .  acetaminophen (TYLENOL) 500 MG tablet, Take 1,000 mg by mouth every 6 (six) hours as needed for mild pain., Disp: , Rfl:  .  amLODipine (NORVASC) 2.5 MG tablet, , Disp: , Rfl:  .  chlorpheniramine (CHLOR-TRIMETON) 4 MG tablet, Take by mouth., Disp: , Rfl:  .  clonazePAM (KLONOPIN) 1 MG tablet, 1 bid, Disp: 60 tablet, Rfl: 4 .  diphenhydrAMINE (BENADRYL) 25 mg capsule, Take 25 mg by mouth every 6 (six) hours as needed for allergies or sleep., Disp: , Rfl:  .  levothyroxine (SYNTHROID, LEVOTHROID) 50 MCG tablet, Take 50 mcg by mouth daily before breakfast. , Disp: , Rfl:  .  valACYclovir (VALTREX) 500 MG tablet, Take 500 mg by mouth daily as needed (Herpes Virus)., Disp: , Rfl:  .  zolpidem (AMBIEN) 10 MG tablet, Take 1 tablet (10 mg total) by mouth at bedtime., Disp: 30 tablet, Rfl: 4  Allergies Flomax [tamsulosin hcl]  Review of Systems Review of Systems - Oncology ROS minor left chest wall pain.     Physical Exam  Vitals Wt Readings from Last 3 Encounters:  10/16/17 195 lb 3.2 oz (88.5 kg)  05/12/17 197 lb (89.4 kg)  04/08/17 195 lb (88.5 kg)   Temp Readings from Last 3 Encounters:  10/16/17 97.8 F (36.6 C) (Oral)  04/08/17 97.6 F (36.4 C) (Oral)  07/08/16 98.6 F (37 C) (Oral)   BP Readings from Last 3 Encounters:  10/16/17 (!) 151/90  05/12/17 132/81  04/08/17 132/79   Pulse Readings from Last 3 Encounters:  10/16/17 63  05/12/17 67  04/08/17  66   Constitutional: Well-developed, well-nourished, and in no distress.   HENT: Head: Normocephalic and atraumatic.  Mouth/Throat: No oropharyngeal exudate. Mucosa moist. Eyes: Pupils are equal, round, and reactive to light. Conjunctivae are normal. No scleral icterus.  Neck: Normal range of motion. Neck supple. No JVD present.  Cardiovascular: Normal rate, regular rhythm and normal heart sounds.  Exam reveals no gallop and no friction rub.   No murmur heard. Pulmonary/Chest: Effort normal and breath sounds normal. No respiratory distress. No wheezes.No rales.  Abdominal: Soft. Bowel sounds are normal. No distension. There is no tenderness. There is no guarding.  Musculoskeletal: No edema or tenderness.  Lymphadenopathy: No cervical, axillary or supraclavicular adenopathy.  Neurological: Alert and oriented to person, place, and time. No cranial nerve deficit.  Skin: Skin is warm and dry. No rash noted. No erythema. No pallor.  Psychiatric: Affect and judgment normal.   Labs No visits with results within 3 Day(s) from this visit.  Latest known visit with results is:  Appointment on 10/05/2017  Component Date Value Ref Range Status  . WBC 10/05/2017 5.7  4.0 - 10.5 K/uL Final  . RBC 10/05/2017 4.25  4.22 - 5.81 MIL/uL Final  . Hemoglobin 10/05/2017 14.2  13.0 - 17.0 g/dL Final  . HCT 10/05/2017 40.8  39.0 - 52.0 % Final  . MCV 10/05/2017 96.0  78.0 - 100.0 fL Final  . MCH 10/05/2017 33.4  26.0 - 34.0 pg Final  . MCHC 10/05/2017 34.8  30.0 - 36.0 g/dL Final  . RDW 10/05/2017 12.9  11.5 - 15.5 % Final  . Platelets 10/05/2017 153  150 - 400 K/uL Final  . Neutrophils Relative % 10/05/2017 46  % Final  . Neutro Abs 10/05/2017 2.6  1.7 - 7.7 K/uL Final  . Lymphocytes Relative 10/05/2017 35  % Final  . Lymphs Abs 10/05/2017 2.0  0.7 - 4.0 K/uL Final  . Monocytes Relative 10/05/2017 8  % Final  . Monocytes Absolute 10/05/2017 0.5  0.1 - 1.0 K/uL Final  . Eosinophils Relative 10/05/2017 9   % Final  . Eosinophils Absolute 10/05/2017 0.5  0.0 - 0.7 K/uL Final  . Basophils Relative 10/05/2017 2  % Final  . Basophils Absolute 10/05/2017 0.1  0.0 - 0.1 K/uL Final   Performed at [facility], [address] [place], [place] [address]  . Sodium 10/05/2017 137  135 - 145 mmol/L Final  . Potassium 10/05/2017 3.8  3.5 - 5.1 mmol/L Final  . Chloride 10/05/2017 105  98 - 111 mmol/L Final  . CO2 10/05/2017 27  22 - 32 mmol/L Final  . Glucose, Bld 10/05/2017 85  70 - 99 mg/dL Final  . BUN 10/05/2017 13  8 - 23 mg/dL Final  . Creatinine, Ser 10/05/2017 0.90  0.61 - 1.24 mg/dL Final  . Calcium 10/05/2017 8.8* 8.9 - 10.3 mg/dL Final  . Total Protein 10/05/2017 7.1  6.5 - 8.1 g/dL Final  . Albumin 10/05/2017 4.2  3.5 - 5.0 g/dL Final  . AST 10/05/2017 26  15 - 41 U/L Final  . ALT 10/05/2017 21  0 - 44 U/L Final  . Alkaline Phosphatase 10/05/2017 30* 38 - 126 U/L Final  . Total Bilirubin 10/05/2017 1.3* 0.3 - 1.2 mg/dL Final  . GFR calc non Af Amer 10/05/2017 >60  >60 mL/min Final  . GFR calc Af Amer 10/05/2017 >60  >60 mL/min Final   Comment: (NOTE) The eGFR has been calculated using the CKD EPI equation. This calculation has not been validated in all clinical situations. eGFR's persistently <60 mL/min signify possible Chronic Kidney Disease.   Georgiann Hahn gap 10/05/2017 5  5 - 15 Final   Performed at [facility], [address] [place], [place] [address]  . LDH 10/05/2017 197* 98 - 192 U/L Final   Performed at [facility], [address] [place], [place] [address]     Pathology Orders Placed This Encounter  Procedures  . CBC with Differential/Platelet    Standing Status:   Future    Standing Expiration Date:   10/17/2019  . Comprehensive metabolic panel    Standing Status:   Future    Standing Expiration Date:   10/17/2019  . Lactate dehydrogenase    Standing Status:   Future    Standing Expiration Date:   10/17/2019       Zoila Shutter MD

## 2017-10-19 ENCOUNTER — Encounter (INDEPENDENT_AMBULATORY_CARE_PROVIDER_SITE_OTHER): Payer: Self-pay | Admitting: *Deleted

## 2017-11-10 ENCOUNTER — Encounter: Payer: Self-pay | Admitting: Thoracic Surgery (Cardiothoracic Vascular Surgery)

## 2017-11-10 ENCOUNTER — Ambulatory Visit
Admission: RE | Admit: 2017-11-10 | Discharge: 2017-11-10 | Disposition: A | Discharge status: Home / Self Care | Payer: Medicare Other | Source: Ambulatory Visit | Attending: Thoracic Surgery (Cardiothoracic Vascular Surgery) | Admitting: Thoracic Surgery (Cardiothoracic Vascular Surgery)

## 2017-11-10 ENCOUNTER — Other Ambulatory Visit: Payer: Self-pay

## 2017-11-10 ENCOUNTER — Ambulatory Visit (INDEPENDENT_AMBULATORY_CARE_PROVIDER_SITE_OTHER): Payer: Medicare Other | Admitting: Thoracic Surgery (Cardiothoracic Vascular Surgery)

## 2017-11-10 VITALS — BP 140/82 | HR 94 | Resp 18 | Ht 73.0 in | Wt 197.0 lb

## 2017-11-10 DIAGNOSIS — I7121 Aneurysm of the ascending aorta, without rupture: Secondary | ICD-10-CM

## 2017-11-10 DIAGNOSIS — I712 Thoracic aortic aneurysm, without rupture: Secondary | ICD-10-CM | POA: Diagnosis not present

## 2017-11-10 MED ORDER — IOPAMIDOL (ISOVUE-370) INJECTION 76%
75.0000 mL | Freq: Once | INTRAVENOUS | Status: AC | PRN
  Administered 2017-11-10: 75 mL via INTRAVENOUS

## 2017-11-10 NOTE — Progress Notes (Signed)
[address]       [place] [address]             [phone]    HPI: Douglas Bryant returns for a scheduled 6 month follow up  Douglas Bryant is a 67 year old man with a history of non-Hodgkin's lymphoma, hypertension, peripheral vascular disease,, back pain, anxiety, and depression.  He was treated with chemotherapy for non-Hodgkin's lymphoma.  In 2017 he was noted to have a 4.3 cm ascending aneurysm.  Looking back at his previous CTs that have been present since his May 2015.  I last saw him in April.  His aneurysm was 4.5 cm at that time.  In the interim since his last visit he does not have any problems with chest pain or shortness of breath.  He says that he does track his blood pressure is usually below 140 but occasionally will be as high as 150.  He had been on metoprolol previously but that caused him to feel poorly and have problems with erectile function.  He currently is on a low-dose of Norvasc.  He also has had adverse effects from losartan.  Past Medical History:  Diagnosis Date  . Anemia   . Aneurysm, aorta, thoracic ([place])   . Anxiety   . Back pain   . Depression   . DLBCL (diffuse large B cell lymphoma) ([place]) 07/17/2015  . Environmental allergies   . History of blood transfusion   . History of bronchitis   . History of chemotherapy   . Hypothyroidism   . Nocturia   . Peripheral vascular disease ([place])   . Urinary frequency     Current Outpatient Medications  Medication Sig Dispense Refill  . acetaminophen (TYLENOL) 500 MG tablet Take 1,000 mg by mouth every 6 (six) hours as needed for mild pain.    Marland Kitchen amLODipine (NORVASC) 2.5 MG tablet     . chlorpheniramine (CHLOR-TRIMETON) 4 MG tablet Take by mouth.    . clonazePAM (KLONOPIN) 1 MG tablet 1 bid 60 tablet 4  . diphenhydrAMINE (BENADRYL) 25 mg capsule Take 25 mg by mouth every 6 (six) hours as needed for allergies or sleep.    Marland Kitchen levothyroxine (SYNTHROID, LEVOTHROID) 50 MCG tablet Take 50 mcg  by mouth daily before breakfast.     . valACYclovir (VALTREX) 500 MG tablet Take 500 mg by mouth daily as needed (Herpes Virus).    . zolpidem (AMBIEN) 10 MG tablet Take 1 tablet (10 mg total) by mouth at bedtime. 30 tablet 4   No current facility-administered medications for this visit.     Physical Exam BP 140/82 (BP Location: Right Arm, Patient Position: Sitting, Cuff Size: Normal)   Pulse 94   Resp 18   Ht 6\' 1"  (1.854 m)   Wt 197 lb (89.4 kg)   SpO2 95% Comment: RA  BMI 25.63 kg/m  67 year old man in no acute distress Alert and oriented x3 with no focal deficits Anxious No carotid bruits Cardiac regular rate and rhythm normal S1 and S2 no rubs murmurs or gallops Lungs clear with equal breath sounds bilaterally No peripheral edema  Diagnostic Tests: CT ANGIOGRAPHY CHEST WITH CONTRAST  TECHNIQUE: Multidetector CT imaging of the chest was performed using the standard protocol during bolus administration of intravenous contrast. Multiplanar CT image reconstructions and MIPs were obtained to evaluate the vascular anatomy.  CONTRAST:  67mL ISOVUE-370 IOPAMIDOL (ISOVUE-370) INJECTION 76%  COMPARISON:  05/12/2017 and previous  FINDINGS: Cardiovascular: Heart size normal.  No pericardial effusion. Incomplete opacification of the pulmonary arterial tree which is unremarkable centrally; the exam was not optimized for detection of pulmonary emboli. Scattered coronary calcifications. Good contrast opacification of the thoracic aorta with transverse dimensions as follows:  4.3 cm sinuses of Valsalva  3.2 cm sino-tubular junction  4.5 cm mid ascending (stable since previous  3.9 cm distal ascending/proximal arch  2.9 cm distal arch  3.1 cm proximal descending  2.6 cm distal descending  No dissection or stenosis. Classic 3 vessel brachiocephalic arterial origin anatomy without proximal stenosis. Minimal calcified atheromatous plaque in the arch. Visualized  proximal abdominal segment unremarkable.  Mediastinum/Nodes: No hilar or mediastinal adenopathy.  Lungs/Pleura: No pleural effusion. No pneumothorax . Chronic atelectasis/consolidation in the lingula stable since 09/01/2013. Right lung clear.  Upper Abdomen: No acute findings.  Musculoskeletal: No chest wall abnormality. No acute or significant osseous findings.  Review of the MIP images confirms the above findings.  IMPRESSION: 1. Stable 4.5 cm ascending thoracic aortic aneurysm without complicating features. 2. Coronary calcifications. The severity of coronary artery disease and any potential stenosis cannot be assessed on this non-gated CT examination. Assessment for potential risk factor modification, dietary therapy or pharmacologic therapy may be warranted, if clinically indicated.   Electronically Signed   By: Douglas Bryant M.D.   On: 11/10/2017 09:26 I personally reviewed the CT images and concur with the findings noted above  Impression: Douglas Bryant is a 66 year old gentleman with a history of non-Hodgkin's lymphoma treated with chemotherapy, hypertension, peripheral arterial disease, anxiety, and depression.  He was incidentally noted to have a 4.3 cm ascending aneurysm 2017.  More recently it has been measuring around 4.5 cm.  Ascending aneurysm-unchanged on CT today.  Needs continued semiannual follow-up.  Hypertension-on low-dose Norvasc.  Blood pressure at upper end of acceptable, currently 140/80.  He does monitor at home.  If is consistently above 140 he needs to discuss with Douglas Bryant.  Non-Hodgkin's lymphoma-in remission  Plan: Return in 6 months with CT Angio of chest  Douglas Nakayama, MD [facility] [phone]

## 2017-11-16 IMAGING — CT CT CHEST W/O CM
3 of 4 series · 17 of 30 positions shown, 19 images · non-contrast
Comparison: 10/17/2015

CLINICAL DATA: Ascending thoracic aortic aneurysm follow-up.
History of non-Hodgkin's lymphoma.

EXAM:
CT CHEST WITHOUT CONTRAST
TECHNIQUE: Multidetector CT imaging of the chest was performed following the
standard protocol without IV contrast.

[Series 3: chest w/o · axial · non-contrast · 0.81mm/px · z∈[-282,-12]mm · 7 of 145 slices shown, 9 images]
[im 19/145  mediastinal]
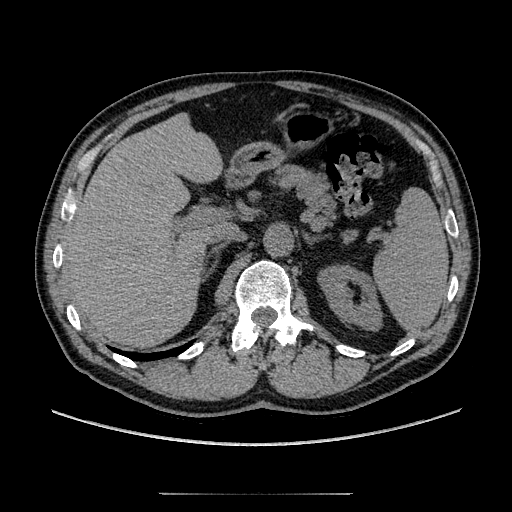
[im 19/145  lung]
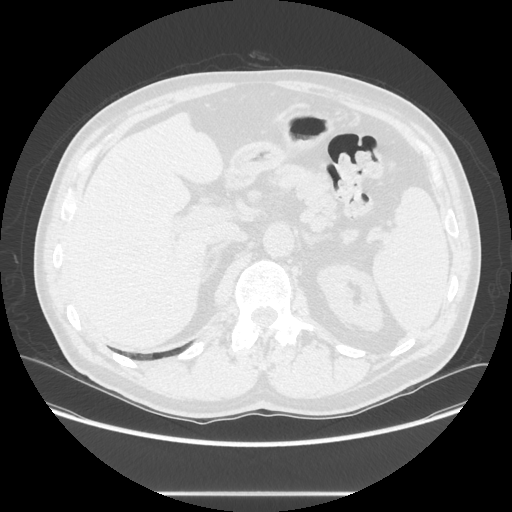
[im 37/145  lung]
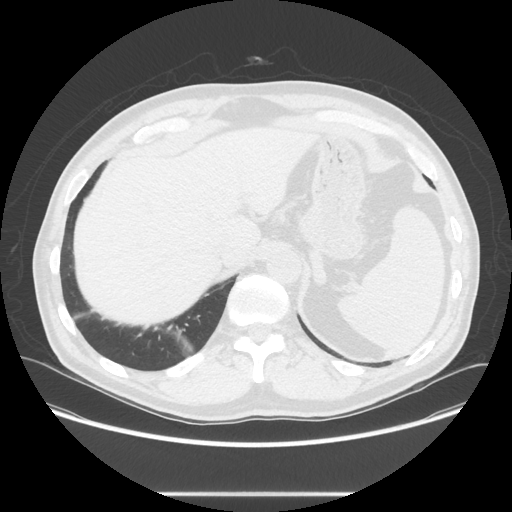
[im 55/145  lung]
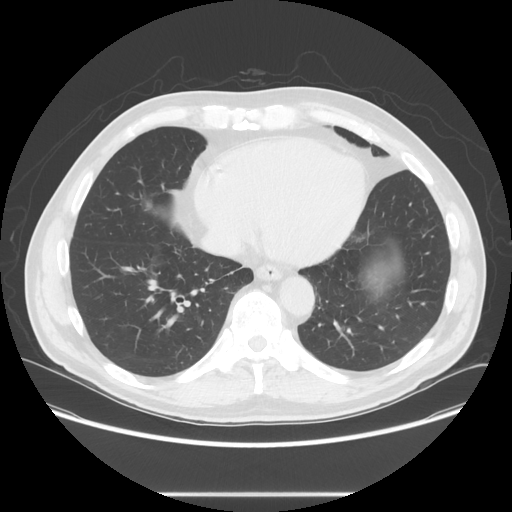
[im 73/145  lung]
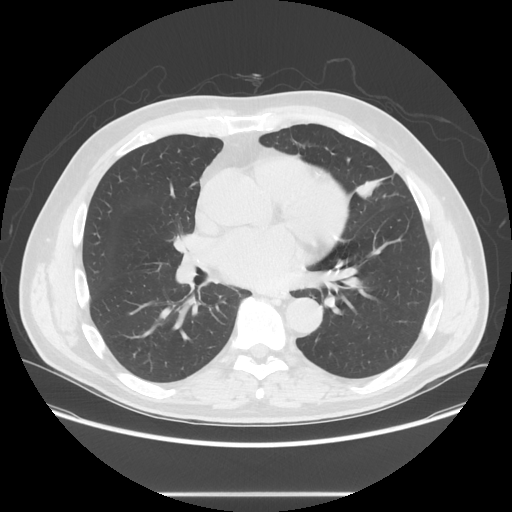
[im 91/145  mediastinal]
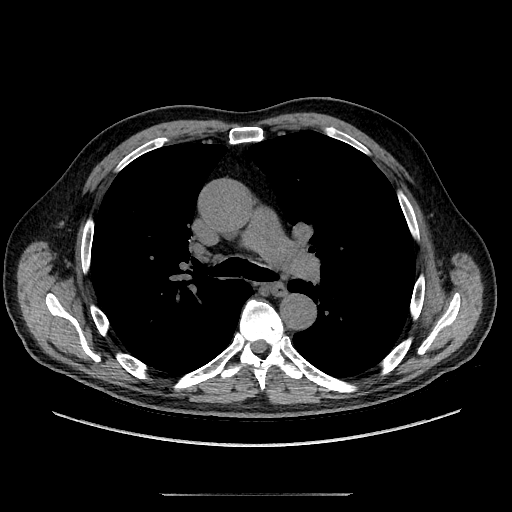
[im 91/145  lung]
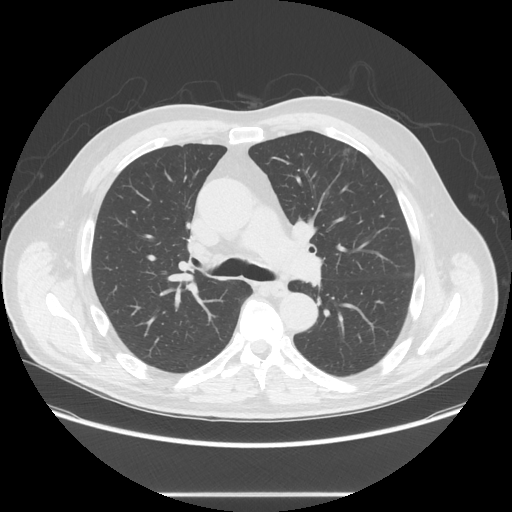
[im 109/145  lung]
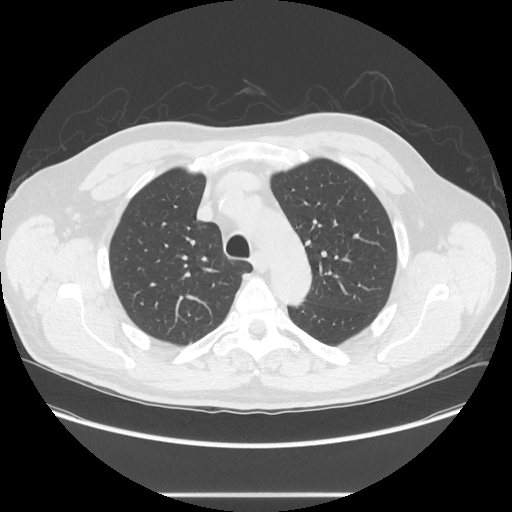
[im 127/145  lung]
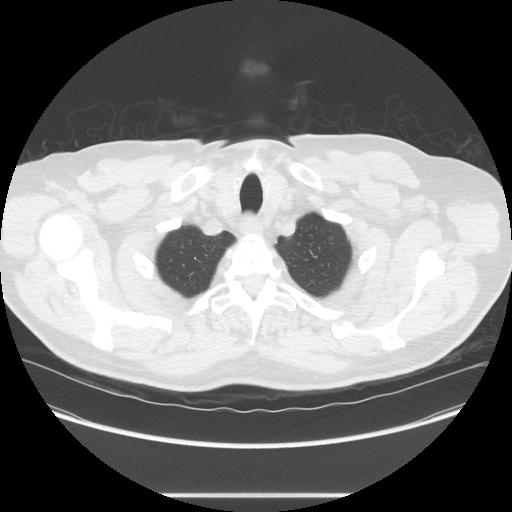

[Series 4: lung windows · axial · 0.81mm/px · z∈[-282,-12]mm · 7 of 145 slices shown]
[im 19/145  lung]
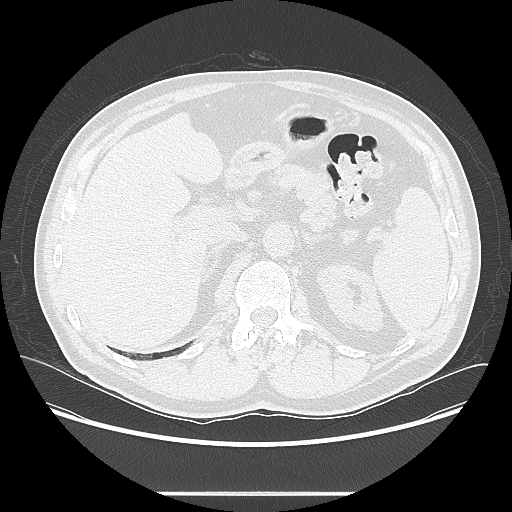
[im 37/145  lung]
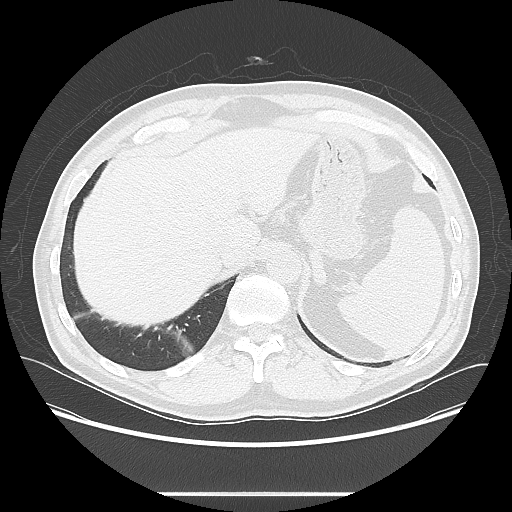
[im 55/145  lung]
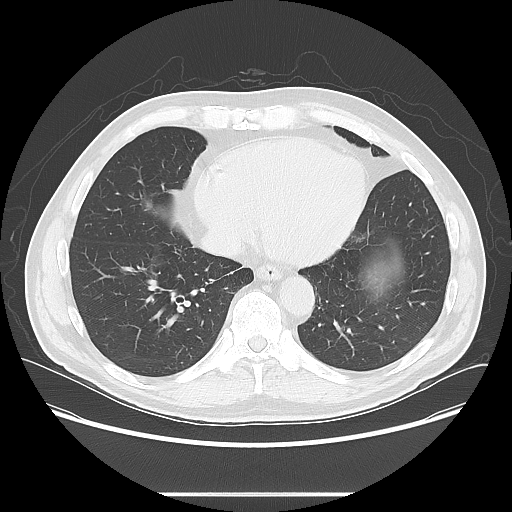
[im 73/145  lung]
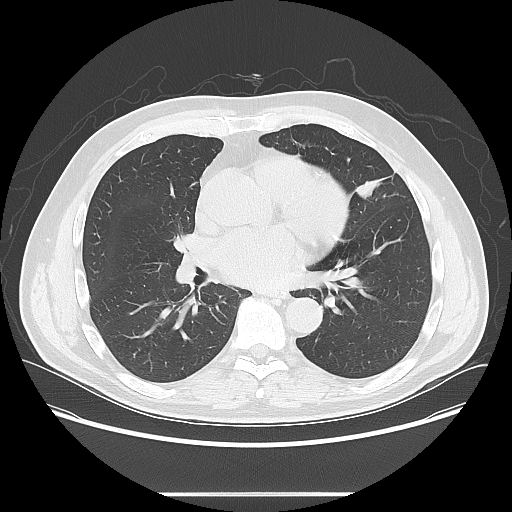
[im 91/145  lung]
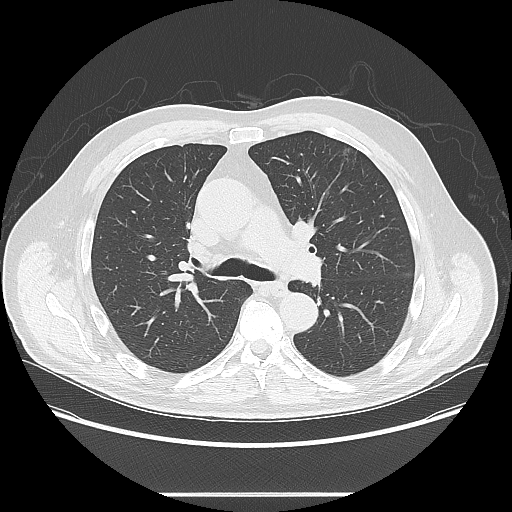
[im 109/145  lung]
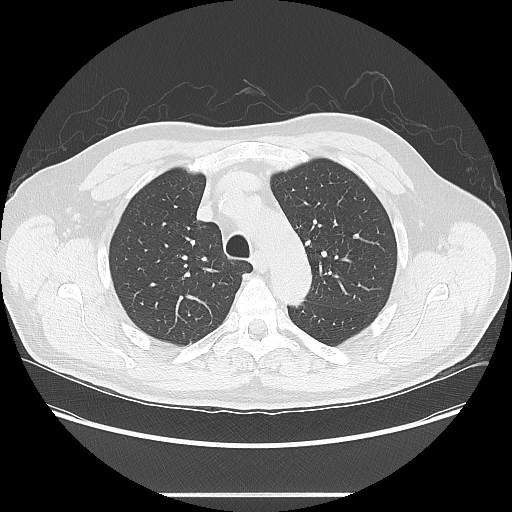
[im 127/145  lung]
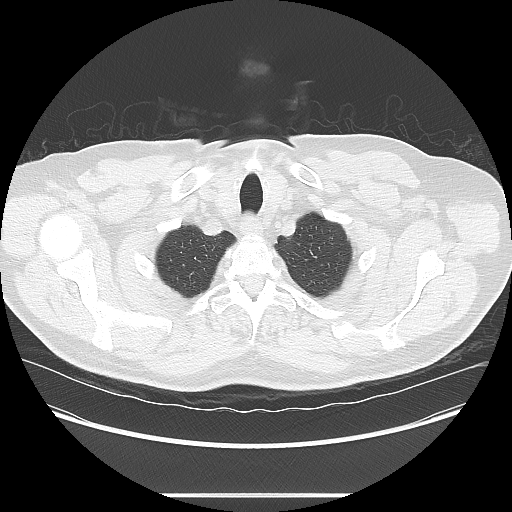

[Series 602: sagittal body · sagittal · 0.81mm/px · 3 of 166 slices shown]
[im 19/166  mediastinal]
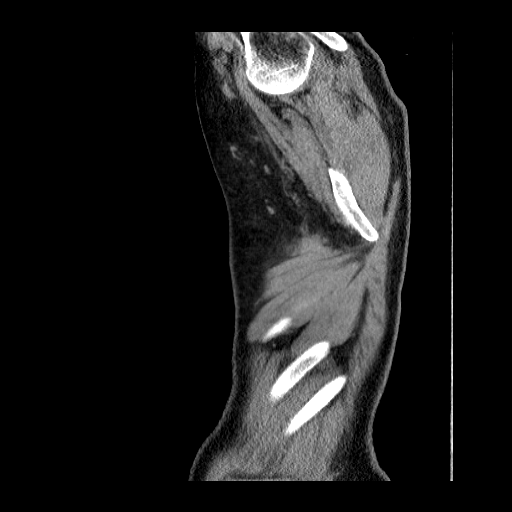
[im 37/166  mediastinal]
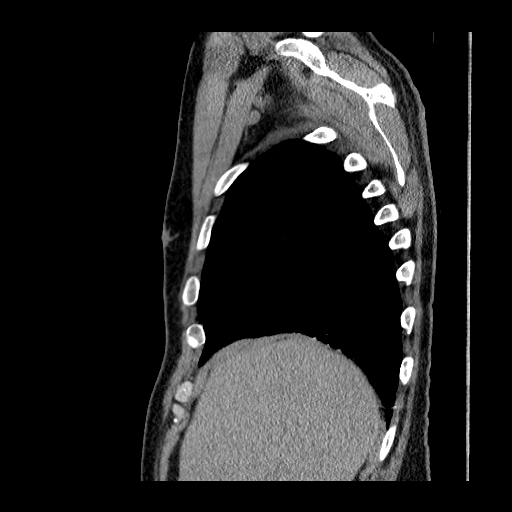
[im 56/166  mediastinal]
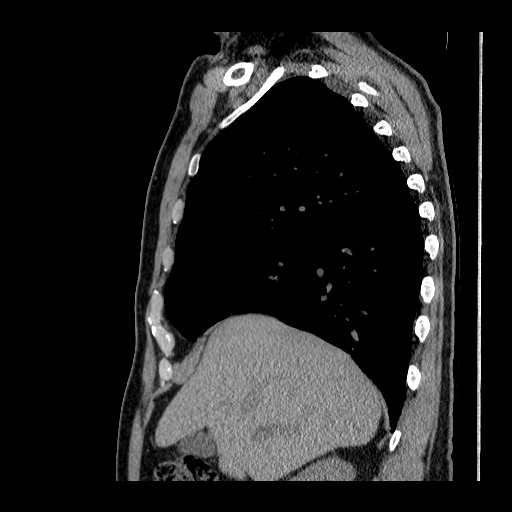

[17 of 30 positions shown; findings below may reference images not displayed]

FINDINGS: Cardiovascular: Maximal diameter of the ascending aorta is 4.6 cm
compared with 4.3 cm on the prior study. Mild atherosclerotic
calcifications of the aortic arch and great vessels. Lack of
intravenous contrast prevents evaluation of the aortic intima.
Descending thoracic aorta is nonaneurysmal. Three vessel coronary
artery calcifications.

Mediastinum/Nodes: No abnormal mediastinal adenopathy. No
pericardial effusion. No mediastinal hemorrhage. Visualized thyroid
is unremarkable. Esophagus is unremarkable.

Lungs/Pleura: No pneumothorax. No pleural effusion. Linear scarring
versus atelectasis in the medial right middle lobe. Dependent
atelectasis at the right lung base. Nodular and linear opacity in
the lingula is stable. This is directly compared with prior studies
dated 10/17/2015 and 03/23/2014.

Upper Abdomen: No acute abnormality.

Musculoskeletal: No acute rib fracture.  Stable thoracic spine.
IMPRESSION: Aneurysmal dilatation of the ascending aorta has increased from a
maximal diameter of 4.3 cm to 4.6 cm. Ascending thoracic aortic
aneurysm. Recommend semi-annual imaging followup by CTA or MRA and
referral to cardiothoracic surgery if not already obtained. This
recommendation follows 7858
ACCF/AHA/AATS/ACR/ASA/SCA/ALIEU/REXHAJ/KESETSENAO/FUCHHI Guidelines for the
Diagnosis and Management of Patients With Thoracic Aortic Disease.
Circulation. 7858; 121: [id number]

Stable scarring in the right middle lobe and lingula.

Aortic Atherosclerosis (SGSZ7-PI6.6).

## 2017-11-26 DIAGNOSIS — H43393 Other vitreous opacities, bilateral: Secondary | ICD-10-CM | POA: Diagnosis not present

## 2017-11-26 DIAGNOSIS — H2512 Age-related nuclear cataract, left eye: Secondary | ICD-10-CM | POA: Diagnosis not present

## 2017-11-26 DIAGNOSIS — H353132 Nonexudative age-related macular degeneration, bilateral, intermediate dry stage: Secondary | ICD-10-CM | POA: Diagnosis not present

## 2017-11-26 DIAGNOSIS — H2513 Age-related nuclear cataract, bilateral: Secondary | ICD-10-CM | POA: Diagnosis not present

## 2017-11-26 DIAGNOSIS — H25013 Cortical age-related cataract, bilateral: Secondary | ICD-10-CM | POA: Diagnosis not present

## 2017-11-26 DIAGNOSIS — H25012 Cortical age-related cataract, left eye: Secondary | ICD-10-CM | POA: Diagnosis not present

## 2017-11-26 DIAGNOSIS — H35032 Hypertensive retinopathy, left eye: Secondary | ICD-10-CM | POA: Diagnosis not present

## 2017-12-03 DIAGNOSIS — Z299 Encounter for prophylactic measures, unspecified: Secondary | ICD-10-CM | POA: Diagnosis not present

## 2017-12-03 DIAGNOSIS — Z6826 Body mass index (BMI) 26.0-26.9, adult: Secondary | ICD-10-CM | POA: Diagnosis not present

## 2017-12-03 DIAGNOSIS — I10 Essential (primary) hypertension: Secondary | ICD-10-CM | POA: Diagnosis not present

## 2017-12-03 DIAGNOSIS — H6690 Otitis media, unspecified, unspecified ear: Secondary | ICD-10-CM | POA: Diagnosis not present

## 2017-12-16 DIAGNOSIS — J302 Other seasonal allergic rhinitis: Secondary | ICD-10-CM | POA: Diagnosis not present

## 2017-12-16 DIAGNOSIS — Z299 Encounter for prophylactic measures, unspecified: Secondary | ICD-10-CM | POA: Diagnosis not present

## 2017-12-16 DIAGNOSIS — J029 Acute pharyngitis, unspecified: Secondary | ICD-10-CM | POA: Diagnosis not present

## 2017-12-16 DIAGNOSIS — H6690 Otitis media, unspecified, unspecified ear: Secondary | ICD-10-CM | POA: Diagnosis not present

## 2017-12-16 DIAGNOSIS — C8590 Non-Hodgkin lymphoma, unspecified, unspecified site: Secondary | ICD-10-CM | POA: Diagnosis not present

## 2017-12-16 DIAGNOSIS — Z6826 Body mass index (BMI) 26.0-26.9, adult: Secondary | ICD-10-CM | POA: Diagnosis not present

## 2017-12-16 DIAGNOSIS — I10 Essential (primary) hypertension: Secondary | ICD-10-CM | POA: Diagnosis not present

## 2017-12-22 DIAGNOSIS — H25812 Combined forms of age-related cataract, left eye: Secondary | ICD-10-CM | POA: Diagnosis not present

## 2017-12-22 DIAGNOSIS — H2512 Age-related nuclear cataract, left eye: Secondary | ICD-10-CM | POA: Diagnosis not present

## 2018-02-01 DIAGNOSIS — H25011 Cortical age-related cataract, right eye: Secondary | ICD-10-CM | POA: Diagnosis not present

## 2018-02-01 DIAGNOSIS — H2511 Age-related nuclear cataract, right eye: Secondary | ICD-10-CM | POA: Diagnosis not present

## 2018-02-03 ENCOUNTER — Ambulatory Visit (INDEPENDENT_AMBULATORY_CARE_PROVIDER_SITE_OTHER): Payer: Medicare Other | Admitting: Psychiatry

## 2018-02-03 ENCOUNTER — Encounter (HOSPITAL_COMMUNITY): Payer: Self-pay | Admitting: Psychiatry

## 2018-02-03 VITALS — BP 136/87 | HR 82 | Ht 73.0 in | Wt 193.6 lb

## 2018-02-03 DIAGNOSIS — F341 Dysthymic disorder: Secondary | ICD-10-CM

## 2018-02-03 DIAGNOSIS — F320 Major depressive disorder, single episode, mild: Secondary | ICD-10-CM

## 2018-02-03 MED ORDER — CLONAZEPAM 1 MG PO TABS: ORAL_TABLET | ORAL | 4 refills | Status: DC

## 2018-02-03 MED ORDER — ZOLPIDEM TARTRATE 10 MG PO TABS: 10.0000 mg | ORAL_TABLET | Freq: Every day | ORAL | 4 refills | Status: DC

## 2018-02-03 NOTE — Progress Notes (Signed)
[name], male   DOB: [birth year], 69 y.o.   MRN: [id number] [facility] MD Progress Note  02/03/2018 1:57 PM Douglas Bryant  MRN:  [id number] Subjective:  Mild depression Principal Problem: Dysthymic disorder  This patient is seen for chronic mild depression.  He has dysthymic disorder.  His level of depression is mild consistent.  He is not paralyzed by his depression.  Things that are complicating his depression is side effects from his blood pressure medicine and his past use of marijuana which is now much less.  Generally he is sleeping well taking Ambien.  His appetite is good his energy is fair.  He was plantar reading watching TV.  He has now.  She has no evidence of psychosis.  His marijuana use is now down to about once a week and he drinks no alcohol.  He is not suicidal.  He recently had cataract surgery on his left eye and plans to have the right in a week.  Patient has chest aneurysm which is well controlled and sees a cardiologist regularly.  His blood pressure is well controlled.  Patient unfortunately is not therapy for reasons he cannot.  Significant problem with insomnia seems to be much better.  He takes Klonopin 1 mg twice daily for anxiety.  Overall he is at his baseline.  He certainly is not suicidal.  His energy level is good and is actually functioning fairly well. Past Medical History:  Past Medical History:  Diagnosis Date  . Anemia   . Aneurysm, aorta, thoracic ([place])   . Anxiety   . Back pain   . Depression   . DLBCL (diffuse large B cell lymphoma) ([place]) 07/17/2015  . Environmental allergies   . History of blood transfusion   . History of bronchitis   . History of chemotherapy   . Hypothyroidism   . Nocturia   . Peripheral vascular disease ([place])   . Urinary frequency     Past Surgical History:  Procedure Laterality Date  . BONE MARROW BIOPSY    . HERNIA REPAIR     umblical times 2; inguinal (left)   . IR REMOVAL TUN ACCESS W/ PORT W/O FL MOD SED   07/08/2016  . port a cath placement    . RHINOPLASTY     Family History:  Family History  Problem Relation Age of Onset  . Alcohol abuse Father   . Depression Sister   . Anxiety disorder Sister   . Depression Sister   . Anxiety disorder Sister    Family Psychiatric  History:  Social History:  Social History   Substance and Sexual Activity  Alcohol Use Yes  . Alcohol/week: 6.0 standard drinks  . Types: 6 Cans of beer per week   Comment: beer occas     Social History   Substance and Sexual Activity  Drug Use No  . Frequency: 2.0 times per week  . Types: Marijuana   Comment: smokes daily for thirty years. currently takes one puff    Social History   Socioeconomic History  . Marital status: Divorced    Spouse name: Not on file  . Number of children: Not on file  . Years of education: Not on file  . Highest education level: Not on file  Occupational History  . Not on file  Social Needs  . Financial resource strain: Not on file  . Food insecurity:    Worry: Not on file    Inability: Not on file  .  Transportation needs:    Medical: Not on file    Non-medical: Not on file  Tobacco Use  . Smoking status: Never Smoker  . Smokeless tobacco: Never Used  Substance and Sexual Activity  . Alcohol use: Yes    Alcohol/week: 6.0 standard drinks    Types: 6 Cans of beer per week    Comment: beer occas  . Drug use: No    Frequency: 2.0 times per week    Types: Marijuana    Comment: smokes daily for thirty years. currently takes one puff  . Sexual activity: Yes    Partners: Female    Birth control/protection: Condom  Lifestyle  . Physical activity:    Days per week: Not on file    Minutes per session: Not on file  . Stress: Not on file  Relationships  . Social connections:    Talks on phone: Not on file    Gets together: Not on file    Attends religious service: Not on file    Active member of club or organization: Not on file    Attends meetings of clubs or  organizations: Not on file    Relationship status: Not on file  Other Topics Concern  . Not on file  Social History Narrative  . Not on file   Additional Social History:                         Sleep: Fair  Appetite:  Good  Current Medications: Current Outpatient Medications  Medication Sig Dispense Refill  . acetaminophen (TYLENOL) 500 MG tablet Take 1,000 mg by mouth every 6 (six) hours as needed for mild pain.    Marland Kitchen amLODipine (NORVASC) 2.5 MG tablet     . clonazePAM (KLONOPIN) 1 MG tablet 1 bid 60 tablet 4  . diphenhydrAMINE (BENADRYL) 25 mg capsule Take 25 mg by mouth every 6 (six) hours as needed for allergies or sleep.    Marland Kitchen levothyroxine (SYNTHROID, LEVOTHROID) 50 MCG tablet Take 50 mcg by mouth daily before breakfast.     . valACYclovir (VALTREX) 500 MG tablet Take 500 mg by mouth daily as needed (Herpes Virus).    . zolpidem (AMBIEN) 10 MG tablet Take 1 tablet (10 mg total) by mouth at bedtime. 30 tablet 4  . chlorpheniramine (CHLOR-TRIMETON) 4 MG tablet Take by mouth.     No current facility-administered medications for this visit.     Lab Results: No results found for this or any previous visit (from the past 48 hour(s)).  Blood Alcohol level:  No results found for: [facility]  Physical Findings: AIMS:  , ,  ,  ,    CIWA:    COWS:     Musculoskeletal: Strength & Muscle Tone: within normal limits Gait & Station: normal Patient leans: N/A  Psychiatric Specialty Exam: ROS  Blood pressure 136/87, pulse 82, height '6\' 1"'$  (1.854 m), weight 193 lb 9.6 oz (87.8 kg), SpO2 94 %.Body mass index is 25.54 kg/m.  General Appearance: Casual  Eye Contact::  Good  Speech:  Clear and Coherent  Volume:  Normal  Mood:mild depression  Affect:  Congruent  Thought Process:  Coherent  Orientation:  Full (Time, Place, and Person)  Thought Content:  WDL  Suicidal Thoughts:  No  Homicidal Thoughts:  No  Memory:  NA  Judgement:  Good  Insight:  Fair  Psychomotor  Activity:  Normal  Concentration:  Good  Recall:  Good  Fund of  Knowledge:Good  Language: Fair  Akathisia:  No  Handed:  Right  AIMS (if indicated):     Assets:  Desire for Improvement  ADL's:  Intact  Cognition: WNL  Sleep:      Treatment Plan Summary: 02/03/2018,  Today the patient has 2 major problems.  That is dysthymic disorder.  He is tried multiple antidepressants without success.  The best treatment would be for him to be in psychotherapy.  I asked him to return back to psychotherapy and he is agreed.  His second problem is insomnia.  This seems to be well controlled by taking Ambien on a regular basis.  Klonopin helps both his sleep as well as his depression.  At this time I believe the patient is stable.  Past attempts to reduce his Klonopin is led to increased anxiety and worsening sleep.  Therefore I think the combination of Ambien and Klonopin is the best thing for his sleep problem.  He is actually active.  Therefore he has 2 problems 1 dysthymic disorder chronic form of depression and insomnia.  At this time both of them are reasonably stable.  I recall that mild in severity.

## 2018-03-23 DIAGNOSIS — H25811 Combined forms of age-related cataract, right eye: Secondary | ICD-10-CM | POA: Diagnosis not present

## 2018-03-23 DIAGNOSIS — H2511 Age-related nuclear cataract, right eye: Secondary | ICD-10-CM | POA: Diagnosis not present

## 2018-03-30 ENCOUNTER — Other Ambulatory Visit: Payer: Self-pay | Admitting: Thoracic Surgery (Cardiothoracic Vascular Surgery)

## 2018-03-30 DIAGNOSIS — I712 Thoracic aortic aneurysm, without rupture, unspecified: Secondary | ICD-10-CM

## 2018-03-30 DIAGNOSIS — I7121 Aneurysm of the ascending aorta, without rupture: Secondary | ICD-10-CM

## 2018-04-02 DIAGNOSIS — H2511 Age-related nuclear cataract, right eye: Secondary | ICD-10-CM | POA: Diagnosis not present

## 2018-04-08 DIAGNOSIS — Z789 Other specified health status: Secondary | ICD-10-CM | POA: Diagnosis not present

## 2018-04-08 DIAGNOSIS — Z6826 Body mass index (BMI) 26.0-26.9, adult: Secondary | ICD-10-CM | POA: Diagnosis not present

## 2018-04-08 DIAGNOSIS — Z299 Encounter for prophylactic measures, unspecified: Secondary | ICD-10-CM | POA: Diagnosis not present

## 2018-04-08 DIAGNOSIS — M47816 Spondylosis without myelopathy or radiculopathy, lumbar region: Secondary | ICD-10-CM | POA: Diagnosis not present

## 2018-04-08 DIAGNOSIS — I10 Essential (primary) hypertension: Secondary | ICD-10-CM | POA: Diagnosis not present

## 2018-05-05 ENCOUNTER — Other Ambulatory Visit: Payer: Self-pay

## 2018-05-10 ENCOUNTER — Other Ambulatory Visit: Payer: Self-pay | Admitting: Thoracic Surgery (Cardiothoracic Vascular Surgery)

## 2018-05-10 ENCOUNTER — Other Ambulatory Visit (HOSPITAL_COMMUNITY): Payer: Self-pay

## 2018-05-11 ENCOUNTER — Ambulatory Visit: Payer: Self-pay | Admitting: Thoracic Surgery (Cardiothoracic Vascular Surgery)

## 2018-05-12 ENCOUNTER — Encounter (INDEPENDENT_AMBULATORY_CARE_PROVIDER_SITE_OTHER): Payer: Self-pay | Admitting: Internal Medicine

## 2018-05-12 ENCOUNTER — Other Ambulatory Visit: Payer: Self-pay

## 2018-05-12 ENCOUNTER — Ambulatory Visit (INDEPENDENT_AMBULATORY_CARE_PROVIDER_SITE_OTHER): Payer: Medicare Other | Admitting: Internal Medicine

## 2018-05-12 VITALS — BP 141/83 | HR 66 | Temp 97.4°F | Ht 73.0 in | Wt 188.9 lb

## 2018-05-12 DIAGNOSIS — K5900 Constipation, unspecified: Secondary | ICD-10-CM

## 2018-05-12 DIAGNOSIS — Z86010 Personal history of colonic polyps: Secondary | ICD-10-CM | POA: Diagnosis not present

## 2018-05-12 NOTE — Patient Instructions (Signed)
The risks of bleeding, perforation and infection were reviewed with patient.  

## 2018-05-12 NOTE — Progress Notes (Signed)
   Subjective:    [name], male    DOB: Nov 01, 1950, 68 y.o.   MRN: [id number]  HPI Referred by Dr. Manuella Ghazi for constipation/Colonoscopy.  He tells me he has had constipation issues all his life. Sometimes he will take Miralax for his constipation .Has taken Senna in the past. He is constipated a lot since a child. He does try to eat fiber. Appetite is good. Has lost about 2 pounds over the past 2 months.  Last colonoscopy in 2011 at [facility]. Two polyps , internal grade 1 hemorrhoids. Biopsy: Tubular adenoma.   Hx of diffuse large B cell lymphoma and is followed by [place]. He says he is in remission.   Has thoracic aneurysm and is followed by Dr. Roxan Hockey.  Retired from [employer]. Review of Systems Past Medical History:  Diagnosis Date  . Anemia   . Aneurysm, aorta, thoracic ([place])   . Anxiety   . Back pain   . Depression   . DLBCL (diffuse large B cell lymphoma) ([place]) 07/17/2015  . Environmental allergies   . History of blood transfusion   . History of bronchitis   . History of chemotherapy   . Hypothyroidism   . Nocturia   . Peripheral vascular disease ([place])   . Urinary frequency     Past Surgical History:  Procedure Laterality Date  . BONE MARROW BIOPSY    . HERNIA REPAIR     umblical times 2; inguinal (left)   . IR REMOVAL TUN ACCESS W/ PORT W/O FL MOD SED  07/08/2016  . port a cath placement    . RHINOPLASTY      Allergies  Allergen Reactions  . Flomax [Tamsulosin Hcl] Other (See Comments)    Current Outpatient Medications on File Prior to Visit  Medication Sig Dispense Refill  . acetaminophen (TYLENOL) 500 MG tablet Take 1,000 mg by mouth every 6 (six) hours as needed for mild pain.    Marland Kitchen amLODipine (NORVASC) 2.5 MG tablet     . ATENOLOL PO Take by mouth.    . clonazePAM (KLONOPIN) 1 MG tablet 1 bid 60 tablet 4  . diphenhydrAMINE (BENADRYL) 25 mg capsule Take 25 mg by mouth every 6 (six) hours as  needed for allergies or sleep.    Marland Kitchen levothyroxine (SYNTHROID, LEVOTHROID) 50 MCG tablet Take 50 mcg by mouth daily before breakfast.     . valACYclovir (VALTREX) 500 MG tablet Take 500 mg by mouth daily as needed (Herpes Virus).    . zolpidem (AMBIEN) 10 MG tablet Take 1 tablet (10 mg total) by mouth at bedtime. 30 tablet 4   No current facility-administered medications on file prior to visit.         Objective:   Physical Exam Blood pressure (!) 141/83, pulse 66, temperature (!) 97.4 F (36.3 C), height '6\' 1"'$  (1.854 m), weight 188 lb 14.4 oz (85.7 kg). Alert and oriented. Skin warm and dry. Oral mucosa is moist.   . Sclera anicteric, conjunctivae is pink. Thyroid not enlarged. No cervical lymphadenopathy. Lungs clear. Heart regular rate and rhythm.  Abdomen is soft. Bowel sounds are positive. No hepatomegaly. No abdominal masses felt. No tenderness.  No edema to lower extremities.        Assessment & Plan:  Constipation. He will continue the Miralax and Senna as needed. Colonic polyps. Needs surveillance.

## 2018-05-17 ENCOUNTER — Ambulatory Visit (HOSPITAL_COMMUNITY): Payer: Self-pay | Admitting: Hematology

## 2018-05-24 ENCOUNTER — Ambulatory Visit (INDEPENDENT_AMBULATORY_CARE_PROVIDER_SITE_OTHER): Payer: Self-pay | Admitting: Internal Medicine

## 2018-06-09 ENCOUNTER — Other Ambulatory Visit: Payer: Self-pay

## 2018-06-09 ENCOUNTER — Ambulatory Visit (INDEPENDENT_AMBULATORY_CARE_PROVIDER_SITE_OTHER): Payer: Medicare Other | Admitting: Psychiatry

## 2018-06-09 DIAGNOSIS — G4700 Insomnia, unspecified: Secondary | ICD-10-CM

## 2018-06-09 DIAGNOSIS — F341 Dysthymic disorder: Secondary | ICD-10-CM | POA: Diagnosis not present

## 2018-06-09 MED ORDER — CLONAZEPAM 1 MG PO TABS: ORAL_TABLET | ORAL | 5 refills | Status: DC

## 2018-06-09 MED ORDER — ZOLPIDEM TARTRATE 10 MG PO TABS: 10.0000 mg | ORAL_TABLET | Freq: Every day | ORAL | 5 refills | Status: DC

## 2018-06-09 NOTE — Progress Notes (Signed)
[name], male   DOB: [birth year], 68 y.o.   MRN: [id number] [facility] MD Progress Note  06/09/2018 4:05 PM Douglas Bryant  MRN:  [id number] Subjective: Lethargic Principal Problem: Dysthymic disorder  At this time the patient is about his baseline.  He has chronic mild depression that is unresponsive to antidepressants.  He sleeps late in the morning to 10 or 11.  He goes to bed about 10.  He does little during the day other than play guitar.  Patient still has a relationship with his girlfriend that he faced times.  His son seems to be fairly stable.  The patient no longer smokes marijuana because it is just not available.  His cataract surgery was successful his aneurysm seems to be stable.  He denies chest pain or shortness of breath.  Overall the patient is actually doing fairly well.  At this time the patient does not drink any alcohol.  He uses no other illicit drugs.  He is actually very stable.  He is going to turn 68 and overall his health is good.  Financially he is stable. Past Medical History:  Past Medical History:  Diagnosis Date  . Anemia   . Aneurysm, aorta, thoracic ([place])   . Anxiety   . Back pain   . Depression   . DLBCL (diffuse large B cell lymphoma) ([place]) 07/17/2015  . Environmental allergies   . History of blood transfusion   . History of bronchitis   . History of chemotherapy   . Hypothyroidism   . Nocturia   . Peripheral vascular disease ([place])   . Urinary frequency     Past Surgical History:  Procedure Laterality Date  . BONE MARROW BIOPSY    . HERNIA REPAIR     umblical times 2; inguinal (left)   . IR REMOVAL TUN ACCESS W/ PORT W/O FL MOD SED  07/08/2016  . port a cath placement    . RHINOPLASTY     Family History:  Family History  Problem Relation Age of Onset  . Alcohol abuse Father   . Depression Sister   . Anxiety disorder Sister   . Depression Sister   . Anxiety disorder Sister    Family Psychiatric  History:  Social History:   Social History   Substance and Sexual Activity  Alcohol Use Yes  . Alcohol/week: 6.0 standard drinks  . Types: 6 Cans of beer per week   Comment: beer occas     Social History   Substance and Sexual Activity  Drug Use No  . Frequency: 2.0 times per week  . Types: Marijuana   Comment: smokes daily for thirty years. currently takes one puff    Social History   Socioeconomic History  . Marital status: Divorced    Spouse name: Not on file  . Number of children: Not on file  . Years of education: Not on file  . Highest education level: Not on file  Occupational History  . Not on file  Social Needs  . Financial resource strain: Not on file  . Food insecurity:    Worry: Not on file    Inability: Not on file  . Transportation needs:    Medical: Not on file    Non-medical: Not on file  Tobacco Use  . Smoking status: Never Smoker  . Smokeless tobacco: Never Used  Substance and Sexual Activity  . Alcohol use: Yes    Alcohol/week: 6.0 standard drinks    Types: 6  Cans of beer per week    Comment: beer occas  . Drug use: No    Frequency: 2.0 times per week    Types: Marijuana    Comment: smokes daily for thirty years. currently takes one puff  . Sexual activity: Yes    Partners: Female    Birth control/protection: Condom  Lifestyle  . Physical activity:    Days per week: Not on file    Minutes per session: Not on file  . Stress: Not on file  Relationships  . Social connections:    Talks on phone: Not on file    Gets together: Not on file    Attends religious service: Not on file    Active member of club or organization: Not on file    Attends meetings of clubs or organizations: Not on file    Relationship status: Not on file  Other Topics Concern  . Not on file  Social History Narrative  . Not on file   Additional Social History:                         Sleep: Fair  Appetite:  Good  Current Medications: Current Outpatient Medications   Medication Sig Dispense Refill  . acetaminophen (TYLENOL) 500 MG tablet Take 1,000 mg by mouth every 6 (six) hours as needed for mild pain.    Marland Kitchen amLODipine (NORVASC) 2.5 MG tablet     . ATENOLOL PO Take by mouth.    . clonazePAM (KLONOPIN) 1 MG tablet 1 bid 60 tablet 5  . diphenhydrAMINE (BENADRYL) 25 mg capsule Take 25 mg by mouth every 6 (six) hours as needed for allergies or sleep.    Marland Kitchen levothyroxine (SYNTHROID, LEVOTHROID) 50 MCG tablet Take 50 mcg by mouth daily before breakfast.     . valACYclovir (VALTREX) 500 MG tablet Take 500 mg by mouth daily as needed (Herpes Virus).    . zolpidem (AMBIEN) 10 MG tablet Take 1 tablet (10 mg total) by mouth at bedtime. 30 tablet 5   No current facility-administered medications for this visit.     Lab Results: No results found for this or any previous visit (from the past 48 hour(s)).  Blood Alcohol level:  No results found for: [facility]  Physical Findings: AIMS:  , ,  ,  ,    CIWA:    COWS:     Musculoskeletal: Strength & Muscle Tone: within normal limits Gait & Station: normal Patient leans: N/A  Psychiatric Specialty Exam: ROS  There were no vitals taken for this visit.There is no height or weight on file to calculate BMI.  General Appearance: Casual  Eye Contact::  Good  Speech:  Clear and Coherent  Volume:  Normal  Mood:mild depression  Affect:  Congruent  Thought Process:  Coherent  Orientation:  Full (Time, Place, and Person)  Thought Content:  WDL  Suicidal Thoughts:  No  Homicidal Thoughts:  No  Memory:  NA  Judgement:  Good  Insight:  Fair  Psychomotor Activity:  Normal  Concentration:  Good  Recall:  Good  Fund of Knowledge:Good  Language: Fair  Akathisia:  No  Handed:  Right  AIMS (if indicated):     Assets:  Desire for Improvement  ADL's:  Intact  Cognition: WNL  Sleep:      Treatment Plan Summary: 06/09/2018,  At this time the patient's major problems are dysthymic disorder.  At this time we are not  treating with  an antidepressant.  His second problem is insomnia.  He takes Ambien and he also takes some Klonopin that helps.  Both of these agents together he uses appropriately.  He clearly gets benefit from them.  I think he has an adjustment disorder with an anxious mood state which is really related to his family circumstances and his physical health.  I think taking Klonopin 1 mg twice daily is appropriate.  He does not fall he is not oversedated.  He notes that when he is taking more than this dose he does feel oversedated.  1 mg twice daily serves him well as well as Ambien.  The patient return to see me in 4 months.

## 2018-06-24 ENCOUNTER — Ambulatory Visit
Admission: RE | Admit: 2018-06-24 | Discharge: 2018-06-24 | Disposition: A | Discharge status: Home / Self Care | Payer: Medicare Other | Source: Ambulatory Visit | Attending: Thoracic Surgery (Cardiothoracic Vascular Surgery) | Admitting: Thoracic Surgery (Cardiothoracic Vascular Surgery)

## 2018-06-24 ENCOUNTER — Other Ambulatory Visit: Payer: Self-pay

## 2018-06-24 DIAGNOSIS — I712 Thoracic aortic aneurysm, without rupture, unspecified: Secondary | ICD-10-CM

## 2018-06-24 MED ORDER — IOPAMIDOL (ISOVUE-370) INJECTION 76%
75.0000 mL | Freq: Once | INTRAVENOUS | Status: AC | PRN
  Administered 2018-06-24: 75 mL via INTRAVENOUS

## 2018-06-28 ENCOUNTER — Other Ambulatory Visit: Payer: Self-pay

## 2018-06-29 ENCOUNTER — Encounter: Payer: Self-pay | Admitting: Thoracic Surgery (Cardiothoracic Vascular Surgery)

## 2018-06-29 ENCOUNTER — Ambulatory Visit (INDEPENDENT_AMBULATORY_CARE_PROVIDER_SITE_OTHER): Payer: Medicare Other | Admitting: Thoracic Surgery (Cardiothoracic Vascular Surgery)

## 2018-06-29 ENCOUNTER — Other Ambulatory Visit: Payer: Self-pay

## 2018-06-29 VITALS — BP 140/87 | HR 80 | Temp 97.7°F | Resp 20 | Ht 73.0 in | Wt 188.0 lb

## 2018-06-29 DIAGNOSIS — I7121 Aneurysm of the ascending aorta, without rupture: Secondary | ICD-10-CM

## 2018-06-29 DIAGNOSIS — I712 Thoracic aortic aneurysm, without rupture: Secondary | ICD-10-CM | POA: Diagnosis not present

## 2018-06-29 NOTE — Progress Notes (Signed)
[address]       [place] [address]             [phone]    HPI: Douglas Bryant returns for follow-up of his ascending aneurysm  Douglas Bryant is a 68 year old man with a past medical history significant for hypertension, anxiety, depression, diffuse large cell lymphoma, peripheral vascular disease, anemia, and an ascending aneurysm.  He was found to have an ascending aneurysm in 2017 on a follow-up scan after chemotherapy for non-Hodgkin's lymphoma.  It was 4.3 cm at that time.  He has been followed annually since then.  I last saw him in October 2019.  His aneurysm was stable and measured 4.4 cm at that time.    He denies any chest pain or shortness of breath.  He was of little evasive when I asked him how he was feeling.  He is on Norvasc as metoprolol caused him to feel poorly.  Past Medical History:  Diagnosis Date  . Anemia   . Aneurysm, aorta, thoracic ([place])   . Anxiety   . Back pain   . Depression   . DLBCL (diffuse large B cell lymphoma) ([place]) 07/17/2015  . Environmental allergies   . History of blood transfusion   . History of bronchitis   . History of chemotherapy   . Hypothyroidism   . Nocturia   . Peripheral vascular disease ([place])   . Urinary frequency     Current Outpatient Medications  Medication Sig Dispense Refill  . acetaminophen (TYLENOL) 500 MG tablet Take 1,000 mg by mouth every 6 (six) hours as needed for mild pain.    Marland Kitchen amLODipine (NORVASC) 2.5 MG tablet     . clonazePAM (KLONOPIN) 1 MG tablet 1 bid 60 tablet 5  . diphenhydrAMINE (BENADRYL) 25 mg capsule Take 25 mg by mouth every 6 (six) hours as needed for allergies or sleep.    Marland Kitchen levothyroxine (SYNTHROID, LEVOTHROID) 50 MCG tablet Take 50 mcg by mouth daily before breakfast.     . valACYclovir (VALTREX) 500 MG tablet Take 500 mg by mouth daily as needed (Herpes Virus).    . zolpidem (AMBIEN) 10 MG tablet Take 1 tablet (10 mg total) by mouth at bedtime. 30 tablet 5   No  current facility-administered medications for this visit.     Physical Exam BP 140/87   Pulse 80   Temp 97.7 F (36.5 C) (Skin)   Resp 20   Ht 6\' 1"  (1.854 m)   Wt 188 lb (85.3 kg)   SpO2 94% Comment: RA  BMI 24.50 kg/m  68 year old man in no acute distress Alert and oriented x3 with no focal deficits Lungs clear with equal breath sounds bilaterally No carotid bruits Cardiac regular rate and rhythm normal S1-S2 No peripheral edema  Diagnostic Tests: CT ANGIOGRAPHY CHEST WITH CONTRAST  TECHNIQUE: Multidetector CT imaging of the chest was performed using the standard protocol during bolus administration of intravenous contrast. Multiplanar CT image reconstructions and MIPs were obtained to evaluate the vascular anatomy.  CONTRAST:  52mL ISOVUE-370 IOPAMIDOL (ISOVUE-370) INJECTION 76%  COMPARISON:  11/10/2017, 05/12/2017, 11/11/2016, 10/17/2015  FINDINGS: Cardiovascular: Preferential opacification of the thoracic aorta. No change in caliber of the tubular ascending thoracic aorta, measuring 4.4 x 4.3 cm, not significantly changed on multiple prior examinations dating back to 2017. The sinuses of Valsalva measure 4.0 cm and the aortic valve measures 2.5 cm. Minimal aortic atherosclerosis. Normal heart size. Scattered three-vessel coronary artery calcifications  no pericardial effusion.  Mediastinum/Nodes: No enlarged mediastinal, hilar, or axillary lymph nodes. Thyroid gland, trachea, and esophagus demonstrate no significant findings.  Lungs/Pleura: Unchanged bandlike scarring of the lingula. No pleural effusion or pneumothorax.  Upper Abdomen: No acute abnormality.  Musculoskeletal: No chest wall abnormality. No acute or significant osseous findings.  Review of the MIP images confirms the above findings.  IMPRESSION: 1. No change in caliber of the tubular ascending thoracic aorta, measuring 4.4 x 4.3 cm, not significantly changed on multiple prior  examinations dating back to 2017. The sinuses of Valsalva measure 4.0 cm and the aortic valve measures 2.5 cm. Minimal aortic atherosclerosis.  2.  Coronary artery disease.   Electronically Signed   By: Eddie Candle M.D.   On: 06/24/2018 11:09 I personally reviewed the CT images and concur with the findings noted above  Impression: Douglas Bryant is a 68 year old man with a history of hypertension, anxiety, depression, treated non-Hodgkin's lymphoma, peripheral arterial disease, and a 4.4 cm ascending aneurysm.  Ascending aneurysm-stable at around 4.3 to 4.4 cm.  We can safely wait and rescan him at 12 months.  Hypertension-blood pressure borderline on Norvasc.  Will defer to primary care for management  Coronary atherosclerosis noted on CT-no anginal symptoms  Plan: Return in 1 year with CT Angie of chest  Melrose Nakayama, MD [facility] [phone]

## 2018-07-06 ENCOUNTER — Encounter (INDEPENDENT_AMBULATORY_CARE_PROVIDER_SITE_OTHER): Payer: Self-pay | Admitting: *Deleted

## 2018-07-06 ENCOUNTER — Telehealth (INDEPENDENT_AMBULATORY_CARE_PROVIDER_SITE_OTHER): Payer: Self-pay | Admitting: *Deleted

## 2018-07-06 ENCOUNTER — Other Ambulatory Visit (INDEPENDENT_AMBULATORY_CARE_PROVIDER_SITE_OTHER): Payer: Self-pay | Admitting: Internal Medicine

## 2018-07-06 DIAGNOSIS — Z86010 Personal history of colonic polyps: Secondary | ICD-10-CM

## 2018-07-06 NOTE — Telephone Encounter (Signed)
Patient needs trilyte 

## 2018-07-07 MED ORDER — PEG 3350-KCL-NA BICARB-NACL 420 G PO SOLR: 4000.0000 mL | Freq: Once | ORAL | 0 refills | Status: AC

## 2018-07-23 ENCOUNTER — Other Ambulatory Visit (HOSPITAL_COMMUNITY): Payer: Medicare Other

## 2018-08-05 ENCOUNTER — Other Ambulatory Visit (HOSPITAL_COMMUNITY): Payer: Self-pay | Admitting: *Deleted

## 2018-08-09 ENCOUNTER — Other Ambulatory Visit (HOSPITAL_COMMUNITY)
Admission: RE | Admit: 2018-08-09 | Discharge: 2018-08-09 | Disposition: A | Discharge status: Home / Self Care | Payer: Medicare Other | Source: Ambulatory Visit | Attending: Internal Medicine | Admitting: Internal Medicine

## 2018-08-09 ENCOUNTER — Other Ambulatory Visit: Payer: Self-pay

## 2018-08-09 DIAGNOSIS — Z1159 Encounter for screening for other viral diseases: Secondary | ICD-10-CM | POA: Insufficient documentation

## 2018-08-10 LAB — SARS CORONAVIRUS 2 (TAT 6-24 HRS): SARS Coronavirus 2: NEGATIVE

## 2018-08-12 ENCOUNTER — Encounter (HOSPITAL_COMMUNITY)
Admission: RE | Disposition: A | Discharge status: Home / Self Care | Payer: Self-pay | Source: Home / Self Care | Attending: Internal Medicine

## 2018-08-12 ENCOUNTER — Other Ambulatory Visit: Payer: Self-pay

## 2018-08-12 ENCOUNTER — Ambulatory Visit (HOSPITAL_COMMUNITY)
Admission: RE | Admit: 2018-08-12 | Discharge: 2018-08-12 | Disposition: A | Discharge status: Home / Self Care | Payer: Medicare Other | Attending: Internal Medicine | Admitting: Internal Medicine

## 2018-08-12 ENCOUNTER — Encounter (HOSPITAL_COMMUNITY): Payer: Self-pay | Admitting: *Deleted

## 2018-08-12 DIAGNOSIS — F329 Major depressive disorder, single episode, unspecified: Secondary | ICD-10-CM | POA: Diagnosis not present

## 2018-08-12 DIAGNOSIS — D120 Benign neoplasm of cecum: Secondary | ICD-10-CM | POA: Insufficient documentation

## 2018-08-12 DIAGNOSIS — Z79899 Other long term (current) drug therapy: Secondary | ICD-10-CM | POA: Diagnosis not present

## 2018-08-12 DIAGNOSIS — E039 Hypothyroidism, unspecified: Secondary | ICD-10-CM | POA: Diagnosis not present

## 2018-08-12 DIAGNOSIS — D125 Benign neoplasm of sigmoid colon: Secondary | ICD-10-CM | POA: Insufficient documentation

## 2018-08-12 DIAGNOSIS — F419 Anxiety disorder, unspecified: Secondary | ICD-10-CM | POA: Diagnosis not present

## 2018-08-12 DIAGNOSIS — Z1211 Encounter for screening for malignant neoplasm of colon: Secondary | ICD-10-CM | POA: Insufficient documentation

## 2018-08-12 DIAGNOSIS — D123 Benign neoplasm of transverse colon: Secondary | ICD-10-CM | POA: Diagnosis not present

## 2018-08-12 DIAGNOSIS — Z86010 Personal history of colonic polyps: Secondary | ICD-10-CM

## 2018-08-12 DIAGNOSIS — Z79890 Hormone replacement therapy (postmenopausal): Secondary | ICD-10-CM | POA: Diagnosis not present

## 2018-08-12 DIAGNOSIS — I739 Peripheral vascular disease, unspecified: Secondary | ICD-10-CM | POA: Diagnosis not present

## 2018-08-12 DIAGNOSIS — I10 Essential (primary) hypertension: Secondary | ICD-10-CM | POA: Diagnosis not present

## 2018-08-12 DIAGNOSIS — Z09 Encounter for follow-up examination after completed treatment for conditions other than malignant neoplasm: Secondary | ICD-10-CM | POA: Diagnosis not present

## 2018-08-12 DIAGNOSIS — K644 Residual hemorrhoidal skin tags: Secondary | ICD-10-CM | POA: Diagnosis not present

## 2018-08-12 HISTORY — PX: POLYPECTOMY: SHX5525

## 2018-08-12 HISTORY — DX: Essential (primary) hypertension: I10

## 2018-08-12 HISTORY — PX: COLONOSCOPY: SHX5424

## 2018-08-12 SURGERY — COLONOSCOPY
Anesthesia: Moderate Sedation

## 2018-08-12 MED ORDER — MEPERIDINE HCL 50 MG/ML IJ SOLN
INTRAMUSCULAR | Status: AC
  Filled 2018-08-12: qty 1

## 2018-08-12 MED ORDER — MEPERIDINE HCL 50 MG/ML IJ SOLN
INTRAMUSCULAR | Status: DC | PRN
  Administered 2018-08-12 (×4): 25 mg via INTRAVENOUS

## 2018-08-12 MED ORDER — SODIUM CHLORIDE 0.9 % IV SOLN
INTRAVENOUS | Status: DC
  Administered 2018-08-12: 12:00:00 via INTRAVENOUS

## 2018-08-12 MED ORDER — MIDAZOLAM HCL 5 MG/5ML IJ SOLN
INTRAMUSCULAR | Status: DC | PRN
  Administered 2018-08-12 (×5): 2 mg via INTRAVENOUS

## 2018-08-12 MED ORDER — MIDAZOLAM HCL 5 MG/5ML IJ SOLN
INTRAMUSCULAR | Status: AC
  Filled 2018-08-12: qty 10

## 2018-08-12 MED ORDER — STERILE WATER FOR IRRIGATION IR SOLN
Status: DC | PRN
  Administered 2018-08-12: 2.5 mL

## 2018-08-12 NOTE — Discharge Instructions (Signed)
No aspirin or NSAIDs for 24 hours. Resume other medications as before. Resume usual diet. No driving for 24 hours. Physician will call with biopsy results.   Colonoscopy, Adult, Care After This sheet gives you information about how to care for yourself after your procedure. Your doctor may also give you more specific instructions. If you have problems or questions, call your doctor. What can I expect after the procedure? After the procedure, it is common to have:  A small amount of blood in your poop for 24 hours.  Some gas.  Mild cramping or bloating in your belly. Follow these instructions at home: General instructions  For the first 24 hours after the procedure: ? Do not drive or use machinery. ? Do not sign important documents. ? Do not drink alcohol. ? Do your daily activities more slowly than normal. ? Eat foods that are soft and easy to digest.  Take over-the-counter or prescription medicines only as told by your doctor. To help cramping and bloating:   Try walking around.  Put heat on your belly (abdomen) as told by your doctor. Use a heat source that your doctor recommends, such as a moist heat pack or a heating pad. ? Put a towel between your skin and the heat source. ? Leave the heat on for 20-30 minutes. ? Remove the heat if your skin turns bright red. This is especially important if you cannot feel pain, heat, or cold. You can get burned. Eating and drinking   Drink enough fluid to keep your pee (urine) clear or pale yellow.  Return to your normal diet as told by your doctor. Avoid heavy or fried foods that are hard to digest.  Avoid drinking alcohol for as long as told by your doctor. Contact a doctor if:  You have blood in your poop (stool) 2-3 days after the procedure. Get help right away if:  You have more than a small amount of blood in your poop.  You see large clumps of tissue (blood clots) in your poop.  Your belly is swollen.  You feel  sick to your stomach (nauseous).  You throw up (vomit).  You have a fever.  You have belly pain that gets worse, and medicine does not help your pain. Summary  After the procedure, it is common to have a small amount of blood in your poop. You may also have mild cramping and bloating in your belly.  For the first 24 hours after the procedure, do not drive or use machinery, do not sign important documents, and do not drink alcohol.  Get help right away if you have a lot of blood in your poop, feel sick to your stomach, have a fever, or have more belly pain. This information is not intended to replace advice given to you by your health care provider. Make sure you discuss any questions you have with your health care provider. Document Released: 02/15/2010 Document Revised: 11/13/2016 Document Reviewed: 10/08/2015 Elsevier Patient Education  2020 [employer].  Hemorrhoids Hemorrhoids are swollen veins in and around the rectum or anus. There are two types of hemorrhoids:  Internal hemorrhoids. These occur in the veins that are just inside the rectum. They may poke through to the outside and become irritated and painful.  External hemorrhoids. These occur in the veins that are outside the anus and can be felt as a painful swelling or hard lump near the anus. Most hemorrhoids do not cause serious problems, and they can be managed with  home treatments such as diet and lifestyle changes. If home treatments do not help the symptoms, procedures can be done to shrink or remove the hemorrhoids. What are the causes? This condition is caused by increased pressure in the anal area. This pressure may result from various things, including:  Constipation.  Straining to have a bowel movement.  Diarrhea.  Pregnancy.  Obesity.  Sitting for long periods of time.  Heavy lifting or other activity that causes you to strain.  Anal sex.  Riding a bike for a long period of time. What are the signs  or symptoms? Symptoms of this condition include:  Pain.  Anal itching or irritation.  Rectal bleeding.  Leakage of stool (feces).  Anal swelling.  One or more lumps around the anus. How is this diagnosed? This condition can often be diagnosed through a visual exam. Other exams or tests may also be done, such as:  An exam that involves feeling the rectal area with a gloved hand (digital rectal exam).  An exam of the anal canal that is done using a small tube (anoscope).  A blood test, if you have lost a significant amount of blood.  A test to look inside the colon using a flexible tube with a camera on the end (sigmoidoscopy or colonoscopy). How is this treated? This condition can usually be treated at home. However, various procedures may be done if dietary changes, lifestyle changes, and other home treatments do not help your symptoms. These procedures can help make the hemorrhoids smaller or remove them completely. Some of these procedures involve surgery, and others do not. Common procedures include:  Rubber band ligation. Rubber bands are placed at the base of the hemorrhoids to cut off their blood supply.  Sclerotherapy. Medicine is injected into the hemorrhoids to shrink them.  Infrared coagulation. A type of light energy is used to get rid of the hemorrhoids.  Hemorrhoidectomy surgery. The hemorrhoids are surgically removed, and the veins that supply them are tied off.  Stapled hemorrhoidopexy surgery. The surgeon staples the base of the hemorrhoid to the rectal wall. Follow these instructions at home: Eating and drinking   Eat foods that have a lot of fiber in them, such as whole grains, beans, nuts, fruits, and vegetables.  Ask your health care provider about taking products that have added fiber (fiber supplements).  Reduce the amount of fat in your diet. You can do this by eating low-fat dairy products, eating less red meat, and avoiding processed  foods.  Drink enough fluid to keep your urine pale yellow. Managing pain and swelling   Take warm sitz baths for 20 minutes, 3-4 times a day to ease pain and discomfort. You may do this in a bathtub or using a portable sitz bath that fits over the toilet.  If directed, apply ice to the affected area. Using ice packs between sitz baths may be helpful. ? Put ice in a plastic bag. ? Place a towel between your skin and the bag. ? Leave the ice on for 20 minutes, 2-3 times a day. General instructions  Take over-the-counter and prescription medicines only as told by your health care provider.  Use medicated creams or suppositories as told.  Get regular exercise. Ask your health care provider how much and what kind of exercise is best for you. In general, you should do moderate exercise for at least 30 minutes on most days of the week (150 minutes each week). This can include activities such as walking,  biking, or yoga.  Go to the bathroom when you have the urge to have a bowel movement. Do not wait.  Avoid straining to have bowel movements.  Keep the anal area dry and clean. Use wet toilet paper or moist towelettes after a bowel movement.  Do not sit on the toilet for long periods of time. This increases blood pooling and pain.  Keep all follow-up visits as told by your health care provider. This is important. Contact a health care provider if you have:  Increasing pain and swelling that are not controlled by treatment or medicine.  Difficulty having a bowel movement, or you are unable to have a bowel movement.  Pain or inflammation outside the area of the hemorrhoids. Get help right away if you have:  Uncontrolled bleeding from your rectum. Summary  Hemorrhoids are swollen veins in and around the rectum or anus.  Most hemorrhoids can be managed with home treatments such as diet and lifestyle changes.  Taking warm sitz baths can help ease pain and discomfort.  In severe  cases, procedures or surgery can be done to shrink or remove the hemorrhoids. This information is not intended to replace advice given to you by your health care provider. Make sure you discuss any questions you have with your health care provider. Document Released: 01/11/2000 Document Revised: 01/21/2018 Document Reviewed: 06/04/2017 Elsevier Patient Education  [place].  Colon Polyps  Polyps are tissue growths inside the body. Polyps can grow in many places, including the large intestine (colon). A polyp may be a round bump or a mushroom-shaped growth. You could have one polyp or several. Most colon polyps are noncancerous (benign). However, some colon polyps can become cancerous over time. Finding and removing the polyps early can help prevent this. What are the causes? The exact cause of colon polyps is not known. What increases the risk? You are more likely to develop this condition if you:  Have a family history of colon cancer or colon polyps.  Are older than 59 or older than 45 if you are African American.  Have inflammatory bowel disease, such as ulcerative colitis or Crohn's disease.  Have certain hereditary conditions, such as: ? Familial adenomatous polyposis. ? Lynch syndrome. ? Turcot syndrome. ? Peutz-Jeghers syndrome.  Are overweight.  Smoke cigarettes.  Do not get enough exercise.  Drink too much alcohol.  Eat a diet that is high in fat and red meat and low in fiber.  Had childhood cancer that was treated with abdominal radiation. What are the signs or symptoms? Most polyps do not cause symptoms. If you have symptoms, they may include:  Blood coming from your rectum when having a bowel movement.  Blood in your stool. The stool may look dark red or black.  Abdominal pain.  A change in bowel habits, such as constipation or diarrhea. How is this diagnosed? This condition is diagnosed with a colonoscopy. This is a procedure in which a lighted,  flexible scope is inserted into the anus and then passed into the colon to examine the area. Polyps are sometimes found when a colonoscopy is done as part of routine cancer screening tests. How is this treated? Treatment for this condition involves removing any polyps that are found. Most polyps can be removed during a colonoscopy. Those polyps will then be tested for cancer. Additional treatment may be needed depending on the results of testing. Follow these instructions at home: Lifestyle  Maintain a healthy weight, or lose weight if recommended  by your health care provider.  Exercise every day or as told by your health care provider.  Do not use any products that contain nicotine or tobacco, such as cigarettes and e-cigarettes. If you need help quitting, ask your health care provider.  If you drink alcohol, limit how much you have: ? 0-1 drink a day for women. ? 0-2 drinks a day for men.  Be aware of how much alcohol is in your drink. In the [place] one drink equals one 12 oz bottle of beer (355 mL), one 5 oz glass of wine (148 mL), or one 1 oz shot of hard liquor (44 mL). Eating and drinking   Eat foods that are high in fiber, such as fruits, vegetables, and whole grains.  Eat foods that are high in calcium and vitamin D, such as milk, cheese, yogurt, eggs, liver, fish, and broccoli.  Limit foods that are high in fat, such as fried foods and desserts.  Limit the amount of red meat and processed meat you eat, such as hot dogs, sausage, bacon, and lunch meats. General instructions  Keep all follow-up visits as told by your health care provider. This is important. ? This includes having regularly scheduled colonoscopies. ? Talk to your health care provider about when you need a colonoscopy. Contact a health care provider if:  You have new or worsening bleeding during a bowel movement.  You have new or increased blood in your stool.  You have a change in bowel habits.  You lose  weight for no known reason. Summary  Polyps are tissue growths inside the body. Polyps can grow in many places, including the colon.  Most colon polyps are noncancerous (benign), but some can become cancerous over time.  This condition is diagnosed with a colonoscopy.  Treatment for this condition involves removing any polyps that are found. Most polyps can be removed during a colonoscopy. This information is not intended to replace advice given to you by your health care provider. Make sure you discuss any questions you have with your health care provider. Document Released: 10/10/2003 Document Revised: 04/30/2017 Document Reviewed: 04/30/2017 Elsevier Patient Education  2020 [employer].

## 2018-08-12 NOTE — H&P (Signed)
Douglas Bryant is an 68 y.o. male.   Chief Complaint: Patient is here for colonoscopy. HPI: Patient is 68 year old Caucasian male who is here for surveillance colonoscopy.  Last exam was in 2012 with removal of small polyps and these are tubular adenomas.  He has had problems with constipation and is using polyethylene glycol on as-needed basis.  He denies abdominal pain rectal bleeding. Family history is negative for CRC.  Past Medical History:  Diagnosis Date  . Anemia   . 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 Anxiety   . Back pain   . Depression   . DLBCL (diffuse large B cell lymphoma) ([place]) 07/17/2015  . Environmental allergies   . History of blood transfusion   . History of bronchitis   . History of chemotherapy   . Hypertension   . Hypothyroidism   . Nocturia   . Peripheral vascular disease ([place])   . Urinary frequency     Past Surgical History:  Procedure Laterality Date  . BONE MARROW BIOPSY    . HERNIA REPAIR     umblical times 2; inguinal (left)   . IR REMOVAL TUN ACCESS W/ PORT W/O FL MOD SED  07/08/2016  . port a cath placement    . RHINOPLASTY      Family History  Problem Relation Age of Onset  . Alcohol abuse Father   . Depression Sister   . Anxiety disorder Sister   . Depression Sister   . Anxiety disorder Sister    Social History:  reports that he has never smoked. He has never used smokeless tobacco. He reports current alcohol use of about 6.0 standard drinks of alcohol per week. He reports that he does not use drugs.  Allergies:  Allergies  Allergen Reactions  . Flomax [Tamsulosin Hcl] Swelling    congestion     Medications Prior to Admission  Medication Sig Dispense Refill  . acetaminophen (TYLENOL) 500 MG tablet Take 1,000 mg by mouth every 6 (six) hours as needed for mild pain.    Marland Kitchen amLODipine (NORVASC) 2.5 MG tablet Take 2.5 mg by mouth every evening.     . clonazePAM (KLONOPIN) 1 MG tablet 1 bid (Patient taking differently: Take 1 mg by  mouth 2 (two) times daily. ) 60 tablet 5  . diphenhydrAMINE (BENADRYL) 25 mg capsule Take 25 mg by mouth at bedtime as needed for allergies or sleep.     Marland Kitchen ibuprofen (ADVIL) 200 MG tablet Take 600-800 mg by mouth every 6 (six) hours as needed for headache or moderate pain.    Marland Kitchen levothyroxine (SYNTHROID, LEVOTHROID) 50 MCG tablet Take 50 mcg by mouth daily before breakfast.     . oxymetazoline (AFRIN) 0.05 % nasal spray Place 1-2 sprays into both nostrils 2 (two) times daily as needed for congestion.    Marland Kitchen zolpidem (AMBIEN) 10 MG tablet Take 1 tablet (10 mg total) by mouth at bedtime. 30 tablet 5  . Ca Carbonate-Mag Hydroxide (ROLAIDS PO) Take 2 tablets by mouth daily as needed (heartburn).    . Carboxymethylcellul-Glycerin (LUBRICATING EYE DROPS OP) Place 1 drop into both eyes daily as needed (dry eyes).    . hydrocortisone cream 1 % Apply 1 application topically daily as needed for itching.    . valACYclovir (VALTREX) 500 MG tablet Take 500 mg by mouth daily as needed (Herpes Virus).      No results found for this or any previous visit (from the past 48 hour(s)). No results found.  ROS  Blood pressure (!) 152/83, pulse 64, temperature 97.9 F (36.6 C), temperature source Oral, resp. rate 18, height _0  (1.854 m), weight 86.2 kg, SpO2 98 %. Physical Exam  Constitutional: He appears well-developed and well-nourished.  HENT:  Mouth/Throat: Oropharynx is clear and moist.  Eyes: Conjunctivae are normal. No scleral icterus.  Neck: No thyromegaly present.  Cardiovascular: Normal rate, regular rhythm and normal heart sounds.  No murmur heard. Respiratory: Effort normal and breath sounds normal.  GI: Soft. He exhibits no distension and no mass. There is no abdominal tenderness.  Musculoskeletal:        General: No edema.  Lymphadenopathy:    He has no cervical adenopathy.  Neurological: He is alert.  Skin: Skin is warm and dry.     Assessment/Plan History of colonic  adenomas. Surveillance colonoscopy.  Hildred Laser, MD 08/12/2018, 1:29 PM

## 2018-08-12 NOTE — Op Note (Signed)
[facility] Patient Name: Douglas Bryant Procedure Date: 08/12/2018 1:17 PM MRN: [id number] Date of Birth: January 18, 1951 Attending MD: Hildred Laser , MD CSN: [id number] Age: 68 Admit Type: Outpatient Procedure:                Colonoscopy Indications:              High risk colon cancer surveillance: Personal                            history of colonic polyps Providers:                Hildred Laser, MD, Otis Peak B. Sharon Seller, RN, Raphael Gibney, Technician Referring MD:             Fuller Canada Manuella Ghazi, MD Medicines:                Meperidine 100 mg IV, Midazolam 10 mg IV Complications:            No immediate complications. Estimated Blood Loss:     Estimated blood loss was minimal. Procedure:                Pre-Anesthesia Assessment:                           - Prior to the procedure, a History and Physical                            was performed, and patient medications and                            allergies were reviewed. The patient's tolerance of                            previous anesthesia was also reviewed. The risks                            and benefits of the procedure and the sedation                            options and risks were discussed with the patient.                            All questions were answered, and informed consent                            was obtained. Prior Anticoagulants: The patient has                            taken no previous anticoagulant or antiplatelet                            agents except for NSAID medication. ASA Grade  Assessment: II - A patient with mild systemic                            disease. After reviewing the risks and benefits,                            the patient was deemed in satisfactory condition to                            undergo the procedure.                           After obtaining informed consent, the colonoscope                            was passed  under direct vision. Throughout the                            procedure, the patient's blood pressure, pulse, and                            oxygen saturations were monitored continuously. The                            PCF-[id number] ([id number]) was introduced through the                            anus and advanced to the the cecum, identified by                            appendiceal orifice and ileocecal valve. The                            colonoscopy was performed without difficulty. The                            patient tolerated the procedure well. The quality                            of the bowel preparation was adequate. The                            ileocecal valve, appendiceal orifice, and rectum                            were photographed. Scope In: 1:45:03 PM Scope Out: 2:07:40 PM Scope Withdrawal Time: 0 hours 16 minutes 17 seconds  Total Procedure Duration: 0 hours 22 minutes 37 seconds  Findings:      The perianal and digital rectal examinations were normal.      Three sessile polyps were found in the splenic flexure and cecum. The       polyps were small in size. These were biopsied with a cold forceps for       histology. The pathology specimen was placed into Bottle Number 1.  A 5 mm polyp was found in the proximal sigmoid colon. The polyp was       sessile. The polyp was removed with a cold snare. Resection and       retrieval were complete. The pathology specimen was placed into Bottle       Number 1.      External hemorrhoids were found during retroflexion. The hemorrhoids       were medium-sized. Impression:               - Three small polyps at the splenic flexure and in                            the cecum. Biopsied.                           - One 5 mm polyp in the proximal sigmoid colon,                            removed with a cold snare. Resected and retrieved.                           - External hemorrhoids. Moderate Sedation:      Moderate  (conscious) sedation was administered by the endoscopy nurse       and supervised by the endoscopist. The following parameters were       monitored: oxygen saturation, heart rate, blood pressure, CO2       capnography and response to care. Total physician intraservice time was       29 minutes. Recommendation:           - Patient has a contact number available for                            emergencies. The signs and symptoms of potential                            delayed complications were discussed with the                            patient. Return to normal activities tomorrow.                            Written discharge instructions were provided to the                            patient.                           - High fiber diet today.                           - Continue present medications.                           - No aspirin, ibuprofen, naproxen, or other  non-steroidal anti-inflammatory drugs for 1 day.                           - Await pathology results.                           - Repeat colonoscopy is recommended. The                            colonoscopy date will be determined after pathology                            results from today's exam become available for                            review. Procedure Code(s):        --- Professional ---                           [phone], Colonoscopy, flexible; with removal of                            tumor(s), polyp(s), or other lesion(s) by snare                            technique                           45380, 59, Colonoscopy, flexible; with biopsy,                            single or multiple                           99153, Moderate sedation; each additional 15                            minutes intraservice time                           G0500, Moderate sedation services provided by the                            same physician or other qualified health care                             professional performing a gastrointestinal                            endoscopic service that sedation supports,                            requiring the presence of an independent trained                            observer to assist in the monitoring of the  patient's level of consciousness and physiological                            status; initial 15 minutes of intra-service time;                            patient age 46 years or older (additional time may                            be reported with [phone], as appropriate) Diagnosis Code(s):        --- Professional ---                           K63.5, Polyp of colon                           Z86.010, Personal history of colonic polyps                           K64.4, Residual hemorrhoidal skin tags CPT copyright 2019 [employer]. All rights reserved. The codes documented in this report are preliminary and upon coder review may  be revised to meet current compliance requirements. Hildred Laser, MD Hildred Laser, MD 08/12/2018 2:17:29 PM This report has been signed electronically. Number of Addenda: 0

## 2018-08-18 ENCOUNTER — Encounter (HOSPITAL_COMMUNITY): Payer: Self-pay | Admitting: Internal Medicine

## 2018-08-19 DIAGNOSIS — M48061 Spinal stenosis, lumbar region without neurogenic claudication: Secondary | ICD-10-CM | POA: Diagnosis not present

## 2018-08-19 DIAGNOSIS — Z6826 Body mass index (BMI) 26.0-26.9, adult: Secondary | ICD-10-CM | POA: Diagnosis not present

## 2018-08-19 DIAGNOSIS — I10 Essential (primary) hypertension: Secondary | ICD-10-CM | POA: Diagnosis not present

## 2018-08-19 DIAGNOSIS — Z299 Encounter for prophylactic measures, unspecified: Secondary | ICD-10-CM | POA: Diagnosis not present

## 2018-08-19 DIAGNOSIS — F329 Major depressive disorder, single episode, unspecified: Secondary | ICD-10-CM | POA: Diagnosis not present

## 2018-08-19 DIAGNOSIS — G4700 Insomnia, unspecified: Secondary | ICD-10-CM | POA: Diagnosis not present

## 2018-09-07 ENCOUNTER — Other Ambulatory Visit: Payer: Self-pay

## 2018-09-07 ENCOUNTER — Encounter (HOSPITAL_COMMUNITY): Payer: Self-pay | Admitting: Licensed Clinical Social Worker

## 2018-09-07 ENCOUNTER — Ambulatory Visit (INDEPENDENT_AMBULATORY_CARE_PROVIDER_SITE_OTHER): Payer: Medicare Other | Admitting: Licensed Clinical Social Worker

## 2018-09-07 DIAGNOSIS — F341 Dysthymic disorder: Secondary | ICD-10-CM

## 2018-09-07 NOTE — Progress Notes (Signed)
Virtual Visit via Video Note  I connected with Douglas Bryant on 09/07/18 at 10:00 AM EDT by a video enabled telemedicine application and verified that I am speaking with the correct person using two identifiers.  Location: Patient: Home Provider: Office   I discussed the limitations of evaluation and management by telemedicine and the availability of in person appointments. The patient expressed understanding and agreed to proceed.   THERAPIST PROGRESS NOTE  Session Time: 10:00 am-10:50 am  Participation Level: Active  Behavioral Response: CasualAlertAnxious  Type of Therapy: Individual Therapy  Treatment Goals addressed: Anxiety  Interventions: CBT and Solution Focused  Summary: Douglas Bryant is a 68 y.o. male who presents  that presents oriented x5 (person, place, situation, time and object), alert, anxious, casually dressed, appropriately groomed, and cooperative to address mood and anxiety. Patient has a history of medical treatment including lymphoma. Patient has a history of mental health treatment including outpatient therapy and medication management. Patient denies symptoms of mania. He admits to passive thoughts of suicide but denies a plan and means. Patient denies homicidal ideations. Patient denies psychosis including auditory and visual hallucination. Patient denies substance abuse. Patient is at low risk for lethality at this time.   Physically: Patient is having back trouble.  Spiritually/values: No issues identified.  Relationships: Patient is getting along with his sister. He has not seen his girlfriend since March due to [place]. Patient lost two of his friends at the beginning of the year. He has been grieving the loss of them and feels like he didn't appreciate them as much as he should have. Emotional/Mental/Behavior: Patient has been depressed. Patient recognized that he didn't appreciate his friends and others like he should have. Patient on one hand  keeps people at arms length but feels like he cares for people a lot and doesn't express it to them as he should. Patient understood that his thoughts impact how he views or reacts to things. Patient agreed to pay attention to his thoughts over the next few weeks.   Patient engaged in session. He responded well to interventions. 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 Patient will continue in outpatient therapy due to being the least restrictive service to meet his needs. Patient made minimal  progress on his goal at this time.   Suicidal/Homicidal: Negativewithout intent/plan  Therapist Response: Therapist reviewed patient's recent thoughts and behaviors. Therapist utilized CBT to address anxiety and mood. Therapist processed patient's feelings to identify triggers for mood. Therapist discussed patient's relationships and feelings of grief.  Plan: Return again in 3 weeks.   Diagnosis: Axis I: Dysthymic Disorder    Axis II: No diagnosis  I discussed the assessment and treatment plan with the patient. The patient was provided an opportunity to ask questions and all were answered. The patient agreed with the plan and demonstrated an understanding of the instructions.   The patient was advised to call back or seek an in-person evaluation if the symptoms worsen or if the condition fails to improve as anticipated.  I provided 45 minutes of non-face-to-face time during this encounter.   Glori Bickers, LCSW 09/07/2018

## 2018-09-13 ENCOUNTER — Telehealth (HOSPITAL_COMMUNITY): Payer: Self-pay

## 2018-09-13 NOTE — Telephone Encounter (Signed)
Patient called, he stated the Ambien has not been working to help him sleep, he found a prescription of Doxepin 25 mg that you had prescribed awhile ago and decided to take it. Patient states that it works better and is helping the anxiety as well, he wants to know if he can change from the Ambien to the Doxepin. Please review and advise, thank you

## 2018-09-29 ENCOUNTER — Other Ambulatory Visit (HOSPITAL_COMMUNITY): Payer: Self-pay

## 2018-09-29 MED ORDER — DOXEPIN HCL 25 MG PO CAPS: 25.0000 mg | ORAL_CAPSULE | Freq: Every day | ORAL | 2 refills | Status: DC

## 2018-09-30 ENCOUNTER — Encounter (HOSPITAL_COMMUNITY): Payer: Self-pay | Admitting: Licensed Clinical Social Worker

## 2018-09-30 ENCOUNTER — Other Ambulatory Visit: Payer: Self-pay

## 2018-09-30 ENCOUNTER — Ambulatory Visit (INDEPENDENT_AMBULATORY_CARE_PROVIDER_SITE_OTHER): Payer: Medicare Other | Admitting: Licensed Clinical Social Worker

## 2018-09-30 DIAGNOSIS — F341 Dysthymic disorder: Secondary | ICD-10-CM

## 2018-09-30 NOTE — Progress Notes (Signed)
Virtual Visit via Video Note  I connected with Douglas Bryant on 09/30/18 at  1:00 PM EDT by a video enabled telemedicine application and verified that I am speaking with the correct person using two identifiers.  Location: Patient: Home Provider: Office   I discussed the limitations of evaluation and management by telemedicine and the availability of in person appointments. The patient expressed understanding and agreed to proceed.   THERAPIST PROGRESS NOTE  Session Time: 1:00 pm-1:50 pm  Participation Level: Active  Behavioral Response: CasualAlertAnxious  Type of Therapy: Individual Therapy  Treatment Goals addressed: Anxiety  Interventions: CBT and Solution Focused  Summary: Douglas Bryant is a 68 y.o. male who presents  that presents oriented x5 (person, place, situation, time and object), alert, anxious, casually dressed, appropriately groomed, and cooperative to address mood and anxiety. Patient has a history of medical treatment including lymphoma. Patient has a history of mental health treatment including outpatient therapy and medication management. Patient denies symptoms of mania. He admits to passive thoughts of suicide but denies a plan and means. Patient denies homicidal ideations. Patient denies psychosis including auditory and visual hallucination. Patient denies substance abuse. Patient is at low risk for lethality at this time.   Physically: Patient continues to have back pain. It makes it difficult for him to do certain activities and accomplish what he needs to. Patient has had some difficulty with his sleep as well and his energy.  Spiritually/values: No issues identified.  Relationships: Patient has been getting along with his sister. He makes her food and takes care of her needs. Patient spent time with his son. He is happy about where their relationship is at currently.  Emotional/Mental/Behavior: Patient has been depressed as well as anxious. Patient  shared his experience with negative, intrusive thoughts. Patient focuses on his past mistakes, faux pas, and regrets. These thoughts lead to negative self worth. He focuses a lot on perceived embarrassment that he has experienced in the past. Patient was also able to acknowledge some positives that are occurring in his life. Patient understood that he can disrupt his negative thoughts by identifying the positives.   Patient engaged in session. He responded well to interventions. 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 Patient will continue in outpatient therapy due to being the least restrictive service to meet his needs. Patient made minimal  progress on his goal at this time.   Suicidal/Homicidal: Negativewithout intent/plan  Therapist Response: Therapist reviewed patient's recent thoughts and behaviors. Therapist utilized CBT to address anxiety and mood. Therapist processed patient's feelings to identify triggers for mood. Therapist discussed patient's thought process and automatic negative response. Therapist assisted and encouraged patient to disrupt thought patterns by acknowledging positives in his life and express gratitude.   Plan: Return again in 3 weeks.   Diagnosis: Axis I: Dysthymic Disorder    Axis II: No diagnosis  I discussed the assessment and treatment plan with the patient. The patient was provided an opportunity to ask questions and all were answered. The patient agreed with the plan and demonstrated an understanding of the instructions.   The patient was advised to call back or seek an in-person evaluation if the symptoms worsen or if the condition fails to improve as anticipated.  I provided 45 minutes of non-face-to-face time during this encounter.   Glori Bickers, LCSW 09/30/2018

## 2018-10-11 DIAGNOSIS — M25461 Effusion, right knee: Secondary | ICD-10-CM | POA: Diagnosis not present

## 2018-10-11 DIAGNOSIS — M47816 Spondylosis without myelopathy or radiculopathy, lumbar region: Secondary | ICD-10-CM | POA: Diagnosis not present

## 2018-10-11 DIAGNOSIS — M25562 Pain in left knee: Secondary | ICD-10-CM | POA: Diagnosis not present

## 2018-10-11 DIAGNOSIS — M549 Dorsalgia, unspecified: Secondary | ICD-10-CM | POA: Diagnosis not present

## 2018-10-11 DIAGNOSIS — E039 Hypothyroidism, unspecified: Secondary | ICD-10-CM | POA: Diagnosis not present

## 2018-10-11 DIAGNOSIS — Z0000 Encounter for general adult medical examination without abnormal findings: Secondary | ICD-10-CM | POA: Diagnosis not present

## 2018-10-11 DIAGNOSIS — Z1331 Encounter for screening for depression: Secondary | ICD-10-CM | POA: Diagnosis not present

## 2018-10-11 DIAGNOSIS — Z7189 Other specified counseling: Secondary | ICD-10-CM | POA: Diagnosis not present

## 2018-10-11 DIAGNOSIS — M170 Bilateral primary osteoarthritis of knee: Secondary | ICD-10-CM | POA: Diagnosis not present

## 2018-10-11 DIAGNOSIS — R5383 Other fatigue: Secondary | ICD-10-CM | POA: Diagnosis not present

## 2018-10-11 DIAGNOSIS — M25462 Effusion, left knee: Secondary | ICD-10-CM | POA: Diagnosis not present

## 2018-10-11 DIAGNOSIS — N400 Enlarged prostate without lower urinary tract symptoms: Secondary | ICD-10-CM | POA: Diagnosis not present

## 2018-10-11 DIAGNOSIS — M5136 Other intervertebral disc degeneration, lumbar region: Secondary | ICD-10-CM | POA: Diagnosis not present

## 2018-10-11 DIAGNOSIS — Z6825 Body mass index (BMI) 25.0-25.9, adult: Secondary | ICD-10-CM | POA: Diagnosis not present

## 2018-10-11 DIAGNOSIS — Z299 Encounter for prophylactic measures, unspecified: Secondary | ICD-10-CM | POA: Diagnosis not present

## 2018-10-11 DIAGNOSIS — Z1339 Encounter for screening examination for other mental health and behavioral disorders: Secondary | ICD-10-CM | POA: Diagnosis not present

## 2018-10-11 DIAGNOSIS — M1711 Unilateral primary osteoarthritis, right knee: Secondary | ICD-10-CM | POA: Diagnosis not present

## 2018-10-11 DIAGNOSIS — I10 Essential (primary) hypertension: Secondary | ICD-10-CM | POA: Diagnosis not present

## 2018-10-11 DIAGNOSIS — M1712 Unilateral primary osteoarthritis, left knee: Secondary | ICD-10-CM | POA: Diagnosis not present

## 2018-10-11 DIAGNOSIS — E7800 Pure hypercholesterolemia, unspecified: Secondary | ICD-10-CM | POA: Diagnosis not present

## 2018-10-11 DIAGNOSIS — M4316 Spondylolisthesis, lumbar region: Secondary | ICD-10-CM | POA: Diagnosis not present

## 2018-10-11 DIAGNOSIS — Z79899 Other long term (current) drug therapy: Secondary | ICD-10-CM | POA: Diagnosis not present

## 2018-10-11 DIAGNOSIS — M25561 Pain in right knee: Secondary | ICD-10-CM | POA: Diagnosis not present

## 2018-10-12 ENCOUNTER — Other Ambulatory Visit (HOSPITAL_COMMUNITY): Payer: Self-pay

## 2018-10-12 DIAGNOSIS — C8330 Diffuse large B-cell lymphoma, unspecified site: Secondary | ICD-10-CM

## 2018-10-13 ENCOUNTER — Inpatient Hospital Stay (HOSPITAL_COMMUNITY): Payer: Medicare Other | Attending: Hematology

## 2018-10-13 ENCOUNTER — Other Ambulatory Visit: Payer: Self-pay

## 2018-10-13 ENCOUNTER — Ambulatory Visit (HOSPITAL_COMMUNITY): Payer: Medicare Other | Admitting: Psychiatry

## 2018-10-13 DIAGNOSIS — C8330 Diffuse large B-cell lymphoma, unspecified site: Secondary | ICD-10-CM | POA: Insufficient documentation

## 2018-10-13 DIAGNOSIS — Z23 Encounter for immunization: Secondary | ICD-10-CM | POA: Insufficient documentation

## 2018-10-13 LAB — CBC WITH DIFFERENTIAL/PLATELET
Abs Immature Granulocytes: 0.07 10*3/uL (ref 0.00–0.07)
Basophils Absolute: 0.2 10*3/uL — ABNORMAL HIGH (ref 0.0–0.1)
Basophils Relative: 3 %
Eosinophils Absolute: 0.5 10*3/uL (ref 0.0–0.5)
Eosinophils Relative: 9 %
HCT: 41.8 % (ref 39.0–52.0)
Hemoglobin: 14.7 g/dL (ref 13.0–17.0)
Immature Granulocytes: 1 %
Lymphocytes Relative: 38 %
Lymphs Abs: 2.1 10*3/uL (ref 0.7–4.0)
MCH: 33.4 pg (ref 26.0–34.0)
MCHC: 35.2 g/dL (ref 30.0–36.0)
MCV: 95 fL (ref 80.0–100.0)
Monocytes Absolute: 0.4 10*3/uL (ref 0.1–1.0)
Monocytes Relative: 7 %
Neutro Abs: 2.4 10*3/uL (ref 1.7–7.7)
Neutrophils Relative %: 42 %
Platelets: 196 10*3/uL (ref 150–400)
RBC: 4.4 MIL/uL (ref 4.22–5.81)
RDW: 12.6 % (ref 11.5–15.5)
WBC: 5.6 10*3/uL (ref 4.0–10.5)
nRBC: 0 % (ref 0.0–0.2)

## 2018-10-13 LAB — COMPREHENSIVE METABOLIC PANEL
ALT: 20 U/L (ref 0–44)
AST: 26 U/L (ref 15–41)
Albumin: 4.2 g/dL (ref 3.5–5.0)
Alkaline Phosphatase: 36 U/L — ABNORMAL LOW (ref 38–126)
Anion gap: 9 (ref 5–15)
BUN: 12 mg/dL (ref 8–23)
CO2: 23 mmol/L (ref 22–32)
Calcium: 8.9 mg/dL (ref 8.9–10.3)
Chloride: 106 mmol/L (ref 98–111)
Creatinine, Ser: 0.81 mg/dL (ref 0.61–1.24)
GFR calc Af Amer: >60 mL/min (ref >60)
GFR calc non Af Amer: >60 mL/min (ref >60)
Glucose, Bld: 104 mg/dL — ABNORMAL HIGH (ref 70–99)
Potassium: 4 mmol/L (ref 3.5–5.1)
Sodium: 138 mmol/L (ref 135–145)
Total Bilirubin: 1.2 mg/dL (ref 0.3–1.2)
Total Protein: 7 g/dL (ref 6.5–8.1)

## 2018-10-13 LAB — LACTATE DEHYDROGENASE: LDH: 185 U/L (ref 98–192)

## 2018-10-20 ENCOUNTER — Encounter (HOSPITAL_COMMUNITY): Payer: Self-pay | Admitting: Hematology

## 2018-10-20 ENCOUNTER — Inpatient Hospital Stay (HOSPITAL_BASED_OUTPATIENT_CLINIC_OR_DEPARTMENT_OTHER): Payer: Medicare Other | Admitting: Hematology

## 2018-10-20 ENCOUNTER — Other Ambulatory Visit: Payer: Self-pay

## 2018-10-20 VITALS — BP 136/89 | HR 70 | Temp 97.9°F | Resp 18 | Wt 190.7 lb

## 2018-10-20 DIAGNOSIS — C8330 Diffuse large B-cell lymphoma, unspecified site: Secondary | ICD-10-CM | POA: Diagnosis not present

## 2018-10-20 DIAGNOSIS — Z0000 Encounter for general adult medical examination without abnormal findings: Secondary | ICD-10-CM

## 2018-10-20 DIAGNOSIS — Z23 Encounter for immunization: Secondary | ICD-10-CM | POA: Diagnosis not present

## 2018-10-20 MED ORDER — INFLUENZA VAC A&B SA ADJ QUAD 0.5 ML IM PRSY
PREFILLED_SYRINGE | INTRAMUSCULAR | Status: AC
  Filled 2018-10-20: qty 0.5

## 2018-10-20 MED ORDER — INFLUENZA VAC A&B SA ADJ QUAD 0.5 ML IM PRSY
0.5000 mL | PREFILLED_SYRINGE | INTRAMUSCULAR | Status: AC
  Administered 2018-10-20: 0.5 mL via INTRAMUSCULAR

## 2018-10-20 NOTE — Patient Instructions (Signed)
[facility] at [facility] Discharge Instructions  You were seen today by Dr. Katragadda. He went over your recent lab results. He will see you back in 1 year for labs and follow up.   Thank you for choosing [facility] at [facility] to provide your oncology and hematology care.  To afford each patient quality time with our provider, please arrive at least 15 minutes before your scheduled appointment time.   If you have a lab appointment with the Cancer Center please come in thru the  Main Entrance and check in at the main information desk  You need to re-schedule your appointment should you arrive 10 or more minutes late.  We strive to give you quality time with our providers, and arriving late affects you and other patients whose appointments are after yours.  Also, if you no show three or more times for appointments you may be dismissed from the clinic at the providers discretion.     Again, thank you for choosing [facility].  Our hope is that these requests will decrease the amount of time that you wait before being seen by our physicians.       _____________________________________________________________  Should you have questions after your visit to [facility], please contact our office at [phone] between the hours of 8:00 a.m. and 4:30 p.m.  Voicemails left after 4:00 p.m. will not be returned until the following business day.  For prescription refill requests, have your pharmacy contact our office and allow 72 hours.    Cancer Center Support Programs:   > Cancer Support Group  2nd Tuesday of the month 1pm-2pm, Journey Room    

## 2018-10-20 NOTE — Progress Notes (Signed)
[name], [place] [id number]   CLINIC:  Medical Oncology/Hematology  PCP:  [name], [place] [address] [phone]   REASON FOR VISIT:  Follow-up for large B-cell lymphoma.  CURRENT THERAPY: Surveillance.  BRIEF ONCOLOGIC HISTORY:  Oncology History  DLBCL (diffuse large B cell lymphoma) ([place])  07/07/2013 Initial Biopsy   CT guided right iliac bone marrow aspiration and core biopsy.   07/07/2013 Pathology Results   Bone Marrow Flow Cytometry - PREDOMINANCE OF T-LYMPHOCYTES WITH NONSPECIFIC REVERSAL OF THE CD4:CD8 RATIO. - NO MONOCLONAL B-CELL POPULATION IDENTIFIED. Bone Marrow, Aspirate,Biopsy, and Clot, right iliac - SLIGHTLY HYPERCELLULAR BONE MARROW FOR AGE WITH TRILINEAGE HEMATOPOIESIS. - NEGATIVE FOR LYMPHOMA - SEE COMMENT. PERIPHERAL BLOOD: - NORMOCYTIC-NORMOCHROMIC ANEMIA. - NEUTROPHILIC LEFT SHIFT.   3/78/5885 - 10/27/2013 Chemotherapy   R-CHOP x 6 cycles by Dr. [name] at [facility]    09/01/2013 PET scan   Minimal metabolic activity associated with the splenic lesion. Otherwise complete response to chemotherapy. No residual hypermetabolic lymph nodes in the skullbase to thigh PET scan. Marked reduction in  lingular consolidation   09/01/2013 PET scan   1. Minimal metabolic activity associated with the splenic lesion. Otherwise complete response to chemotherapy. 1. No residual hypermetabolic lymph nodes in the skullbase to thigh PET scan.  2. Marked reduction in  lingular consolidation   11/28/2013 PET scan   1. Today's study is very similar to prior study from 09/01/2013. Specifically, the ill-defined low-attenuation lesion in the anterior aspect of the spleen is slightly smaller, currently measuring 2.1 x 2.3 cm, and continues to demonstrate some very low-level metabolic activity (SUVmax = 2.9). 2. There is a persistent mass-like opacity in the posterior aspect of the left upper lobe, which also  appears slightly smaller than the prior examination. This too demonstrates some low-level metabolic activity (SUVmax = 2.7). While this may simply represent a resolving post infectious or inflammatory scar, if there is any clinical concern that the opacity on the original study from 06/09/2013 that this was in fact a lymphomatous infiltrate, this could represent a residual focus of disease. 3. No new foci of disease noted in the neck, chest, abdomen or pelvis. 4. Atherosclerosis, including left main and 3 vessel coronary artery disease. Please note that although the presence of coronary artery calcium documents the presence of coronary artery disease, the severity of this disease and any potential stenosis cannot be assessed on this non-gated CT examination. Assessment for potential risk factor modification, dietary therapy or pharmacologic therapy may be warranted, if clinically indicated. 5. Mild cardiomegaly.   07/17/2015 Initial Diagnosis   DLBCL (diffuse large B cell lymphoma) ([place])   10/17/2015 Imaging   CT CAP- Stable exam. No evidence for lymphadenopathy in the chest, abdomen, or pelvis. No new or progressive interval findings. 2. 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      CANCER STAGING: Cancer Staging No matching staging information was found for the patient.   INTERVAL HISTORY:  Douglas Bryant 68 y.o. male seen for follow-up of diffuse large B-cell lymphoma.  He finished chemotherapy in October 2015.  At presentation he had severe night sweats and 40 pound weight loss.  He denies any severe night sweats at this time but has some minor night sweats.  He does have chronic constipation which is stable.  Numbness in the feet has also been stable.  He also reports on and off depression.  Appetite is 100%.  Energy levels  are 50%.    REVIEW OF SYSTEMS:  Review of Systems  Gastrointestinal: Positive for constipation.  Neurological: Positive for  numbness.  Psychiatric/Behavioral: Positive for depression and sleep disturbance.  All other systems reviewed and are negative.    PAST MEDICAL/SURGICAL HISTORY:  Past Medical History:  Diagnosis Date   Anemia    Aneurysm, aorta, thoracic (HCC)    Anxiety    Back pain    Depression    DLBCL (diffuse large B cell lymphoma) ([place]) 07/17/2015   Environmental allergies    History of blood transfusion    History of bronchitis    History of chemotherapy    Hypertension    Hypothyroidism    Nocturia    Peripheral vascular disease (HCC)    Urinary frequency    Past Surgical History:  Procedure Laterality Date   BONE MARROW BIOPSY     COLONOSCOPY N/A 08/12/2018   Procedure: COLONOSCOPY;  Surgeon: [name], MD;  [facility];  Service: Endoscopy;  Laterality: N/A;  1030   HERNIA REPAIR     umblical times 2; inguinal (left)    IR REMOVAL TUN ACCESS W/ PORT W/O FL MOD SED  07/08/2016   POLYPECTOMY  08/12/2018   Procedure: POLYPECTOMY;  Surgeon: [name], MD;  Location: [facility];  Service: Endoscopy;;  colon   port a cath placement     RHINOPLASTY       SOCIAL HISTORY:  Social History   Socioeconomic History   Marital status: Divorced    Spouse name: Not on file   Number of children: Not on file   Years of education: Not on file   Highest education level: Not on file  Occupational History   Not on file  Social Needs   Financial resource strain: Not on file   Food insecurity    Worry: Not on file    Inability: Not on file   Transportation needs    Medical: Not on file    Non-medical: Not on file  Tobacco Use   Smoking status: Never Smoker   Smokeless tobacco: Never Used  Substance and Sexual Activity   Alcohol use: Yes    Alcohol/week: 6.0 standard drinks    Types: 6 Cans of beer per week    Comment: beer occas   Drug use: No    Frequency: 2.0 times per week    Types: Marijuana    Comment: smokes  daily for thirty years. currently takes one puff   Sexual activity: Yes    Partners: Female    Birth control/protection: Condom  Lifestyle   Physical activity    Days per week: Not on file    Minutes per session: Not on file   Stress: Not on file  Relationships   Social connections    Talks on phone: Not on file    Gets together: Not on file    Attends religious service: Not on file    Active member of club or organization: Not on file    Attends meetings of clubs or organizations: Not on file    Relationship status: Not on file   Intimate partner violence    Fear of current or ex partner: Not on file    Emotionally abused: Not on file    Physically abused: Not on file    Forced sexual activity: Not on file  Other Topics Concern   Not on file  Social History Narrative   Not on file  FAMILY HISTORY:  Family History  Problem Relation Age of Onset   Alcohol abuse Father    Depression Sister    Anxiety disorder Sister    Depression Sister    Anxiety disorder Sister     CURRENT MEDICATIONS:  Outpatient Encounter Medications as of 10/20/2018  Medication Sig   amLODipine (NORVASC) 2.5 MG tablet Take 2.5 mg by mouth every evening.    clonazePAM (KLONOPIN) 1 MG tablet 1 bid (Patient taking differently: Take 1 mg by mouth 2 (two) times daily. )   doxepin (SINEQUAN) 25 MG capsule Take 1 capsule (25 mg total) by mouth at bedtime.   levothyroxine (SYNTHROID, LEVOTHROID) 50 MCG tablet Take 50 mcg by mouth daily before breakfast.    zolpidem (AMBIEN) 10 MG tablet Take 1 tablet (10 mg total) by mouth at bedtime.   acetaminophen (TYLENOL) 500 MG tablet Take 1,000 mg by mouth every 6 (six) hours as needed for mild pain.   Ca Carbonate-Mag Hydroxide (ROLAIDS PO) Take 2 tablets by mouth daily as needed (heartburn).   Carboxymethylcellul-Glycerin (LUBRICATING EYE DROPS OP) Place 1 drop into both eyes daily as needed (dry eyes).   diphenhydrAMINE (BENADRYL) 25 mg  capsule Take 25 mg by mouth at bedtime as needed for allergies or sleep.    hydrocortisone cream 1 % Apply 1 application topically daily as needed for itching.   ibuprofen (ADVIL) 200 MG tablet Take 600-800 mg by mouth every 6 (six) hours as needed for headache or moderate pain.   oxymetazoline (AFRIN) 0.05 % nasal spray Place 1-2 sprays into both nostrils 2 (two) times daily as needed for congestion.   traMADol (ULTRAM) 50 MG tablet    valACYclovir (VALTREX) 500 MG tablet Take 500 mg by mouth daily as needed (Herpes Virus).   Facility-Administered Encounter Medications as of 10/20/2018  Medication   [START ON 10/21/2018] influenza vaccine adjuvanted (FLUAD) injection 0.5 mL    ALLERGIES:  Allergies  Allergen Reactions   Flomax [Tamsulosin Hcl] Swelling    congestion      PHYSICAL EXAM:  ECOG Performance status: 1  Vitals:   10/20/18 1426  BP: 136/89  Pulse: 70  Resp: 18  Temp: 97.9 F (36.6 C)  SpO2: 97%   Filed Weights   10/20/18 1426  Weight: 190 lb 11.2 oz (86.5 kg)    Physical Exam Vitals signs reviewed.  Constitutional:      Appearance: Normal appearance.  Cardiovascular:     Rate and Rhythm: Normal rate and regular rhythm.     Heart sounds: Normal heart sounds.  Pulmonary:     Effort: Pulmonary effort is normal.     Breath sounds: Normal breath sounds.  Abdominal:     General: There is no distension.     Palpations: Abdomen is soft. There is no mass.  Musculoskeletal:        General: No swelling.  Lymphadenopathy:     Cervical: No cervical adenopathy.  Skin:    General: Skin is warm.  Neurological:     General: No focal deficit present.     Mental Status: He is alert and oriented to person, place, and time.  Psychiatric:        Mood and Affect: Mood normal.        Behavior: Behavior normal.      LABORATORY DATA:  I have reviewed the labs as listed.  CBC    Component Value Date/Time   WBC 5.6 10/13/2018 1147   RBC 4.40 10/13/2018 1147  HGB 14.7 10/13/2018 1147   HCT 41.8 10/13/2018 1147   PLT 196 10/13/2018 1147   MCV 95.0 10/13/2018 1147   MCH 33.4 10/13/2018 1147   MCHC 35.2 10/13/2018 1147   RDW 12.6 10/13/2018 1147   LYMPHSABS 2.1 10/13/2018 1147   MONOABS 0.4 10/13/2018 1147   EOSABS 0.5 10/13/2018 1147   BASOSABS 0.2 (H) 10/13/2018 1147   CMP Latest Ref Rng & Units 10/13/2018 10/05/2017 04/08/2017  Glucose 70 - 99 mg/dL 104(H) 85 135(H)  BUN 8 - 23 mg/dL '12 13 16  '$ Creatinine 0.61 - 1.24 mg/dL 0.81 0.90 0.93  Sodium 135 - 145 mmol/L 138 137 137  Potassium 3.5 - 5.1 mmol/L 4.0 3.8 3.8  Chloride 98 - 111 mmol/L 106 105 101  CO2 22 - 32 mmol/L '23 27 26  '$ Calcium 8.9 - 10.3 mg/dL 8.9 8.8(L) 9.8  Total Protein 6.5 - 8.1 g/dL 7.0 7.1 7.3  Total Bilirubin 0.3 - 1.2 mg/dL 1.2 1.3(H) 1.1  Alkaline Phos 38 - 126 U/L 36(L) 30(L) 40  AST 15 - 41 U/L '26 26 30  '$ ALT 0 - 44 U/L '20 21 24       '$ DIAGNOSTIC IMAGING:  I have independently reviewed the scans and discussed with the patient.     ASSESSMENT & PLAN:   DLBCL (diffuse large B cell lymphoma) (HCC) 1.  Stage IVB DLBCL: - Presentation with night sweats, 40 pound weight loss, nausea. -Treated with 6 cycles of chemotherapy at [facility], completed on 10/27/2013. - Denies any fevers, night sweats or weight loss in the last 1 year. -Appetite is normal however energy levels are low.  Patient reports that it goes along with his periodic depression. -Physical exam did not reveal any palpable adenopathy or splenomegaly. -We reviewed his lab work.  CBC, LFTs and LDH are within normal limits. - He will come back in 1 year for follow-up with physical exam and labs.      Orders placed this encounter:  Orders Placed This Encounter  Procedures   CBC with Differential/Platelet   Comprehensive metabolic panel   Lactate dehydrogenase      Derek Jack, MD [place] [phone]

## 2018-10-20 NOTE — Assessment & Plan Note (Signed)
1.  Stage IVB DLBCL: - Presentation with night sweats, 40 pound weight loss, nausea. -Treated with 6 cycles of chemotherapy at [facility], completed on 10/27/2013. - Denies any fevers, night sweats or weight loss in the last 1 year. -Appetite is normal however energy levels are low.  Patient reports that it goes along with his periodic depression. -Physical exam did not reveal any palpable adenopathy or splenomegaly. -We reviewed his lab work.  CBC, LFTs and LDH are within normal limits. - He will come back in 1 year for follow-up with physical exam and labs.

## 2018-10-21 ENCOUNTER — Ambulatory Visit (INDEPENDENT_AMBULATORY_CARE_PROVIDER_SITE_OTHER): Payer: Medicare Other | Admitting: Licensed Clinical Social Worker

## 2018-10-21 ENCOUNTER — Other Ambulatory Visit: Payer: Self-pay

## 2018-10-21 ENCOUNTER — Encounter (HOSPITAL_COMMUNITY): Payer: Self-pay | Admitting: Licensed Clinical Social Worker

## 2018-10-21 DIAGNOSIS — F341 Dysthymic disorder: Secondary | ICD-10-CM | POA: Diagnosis not present

## 2018-10-21 NOTE — Progress Notes (Signed)
Virtual Visit via Video Note  I connected with Douglas Bryant on 10/21/18 at  2:00 PM EDT by a video enabled telemedicine application and verified that I am speaking with the correct person using two identifiers.  Location: Patient: Home Provider: Office   I discussed the limitations of evaluation and management by telemedicine and the availability of in person appointments. The patient expressed understanding and agreed to proceed.   THERAPIST PROGRESS NOTE  Session Time: 2:00 pm-2:50 pm  Participation Level: Active  Behavioral Response: CasualAlertAnxious  Type of Therapy: Individual Therapy  Treatment Goals addressed: Anxiety  Interventions: CBT and Solution Focused  Summary: Douglas Bryant is a 68 y.o. male who presents  that presents oriented x5 (person, place, situation, time and object), alert, anxious, casually dressed, appropriately groomed, and cooperative to address mood and anxiety. Patient has a history of medical treatment including lymphoma. Patient has a history of mental health treatment including outpatient therapy and medication management. Patient denies symptoms of mania. He admits to passive thoughts of suicide but denies a plan and means. Patient denies homicidal ideations. Patient denies psychosis including auditory and visual hallucination. Patient denies substance abuse. Patient is at low risk for lethality at this time.   Physically: Patient sleeps late in the day. He wakes up around noon. Patient goes to bed round 10:30 pm but doesn't fall asleep until midnight. After discussion, patient acknowledged that he listens to music, reads, etc. Patient agreed to work on his schedule to get up a little earlier.  Patient's energy level has improved.  Spiritually/values: No issues identified.  Relationships: No issues identified.  Emotional/Mental/Behavior: Patient's mood has improved. He feels like his medication has helped energy and this improved mood.  Patient was given a worksheet on depression, anxiety, and thought records to record his mood, thoughts and outcome.  Patient engaged in session. He responded well to interventions. 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 Patient will continue in outpatient therapy due to being the least restrictive service to meet his needs. Patient made minimal  progress on his goal at this time.   Suicidal/Homicidal: Negativewithout intent/plan  Therapist Response: Therapist reviewed patient's recent thoughts and behaviors. Therapist utilized CBT to address anxiety and mood. Therapist processed patient's feelings to identify triggers for mood. Therapist provided psychoeducation on sleep. Therapist reviewed CBT and instructed patient on how to use thought record to record his thoughts.   Plan: Return again in 3 weeks.   Diagnosis: Axis I: Dysthymic Disorder    Axis II: No diagnosis  I discussed the assessment and treatment plan with the patient. The patient was provided an opportunity to ask questions and all were answered. The patient agreed with the plan and demonstrated an understanding of the instructions.   The patient was advised to call back or seek an in-person evaluation if the symptoms worsen or if the condition fails to improve as anticipated.  I provided 45 minutes of non-face-to-face time during this encounter.   Glori Bickers, LCSW 10/21/2018

## 2018-10-29 ENCOUNTER — Ambulatory Visit (INDEPENDENT_AMBULATORY_CARE_PROVIDER_SITE_OTHER): Payer: Medicare Other | Admitting: Psychiatry

## 2018-10-29 ENCOUNTER — Other Ambulatory Visit: Payer: Self-pay

## 2018-10-29 DIAGNOSIS — G4700 Insomnia, unspecified: Secondary | ICD-10-CM | POA: Diagnosis not present

## 2018-10-29 DIAGNOSIS — F341 Dysthymic disorder: Secondary | ICD-10-CM

## 2018-10-29 MED ORDER — ZOLPIDEM TARTRATE 10 MG PO TABS: 10.0000 mg | ORAL_TABLET | Freq: Every day | ORAL | 5 refills | Status: DC

## 2018-10-29 MED ORDER — CLONAZEPAM 1 MG PO TABS: 1.0000 mg | ORAL_TABLET | Freq: Two times a day (BID) | ORAL | 4 refills | Status: DC

## 2018-10-29 NOTE — Progress Notes (Signed)
[name], male   DOB: 10-05-1950, 68 y.o.   MRN: [id number] [facility] MD Progress Note  10/29/2018 11:33 AM Douglas Bryant  MRN:  [id number] Subjective: Lethargic Principal Problem: Dysthymic disorder  Unfortunately there was some scheduling issues with this patient.  There was a lack of communication of whether or not he would come in for the visit or not.  Still yet to spend at least 15 minutes discussing how is doing.  Overall seems to be at his baseline.  He talked about a distant history of OCD and the possibility of trialing Luvox.  Today we avoided that.  I do not think this patient has significant major depression.  I think he has dysthymic disorder and is doing the best thing.  He is started back in one-to-one therapy.  His anxiety is well controlled taking Klonopin and he sleeps well on Ambien.  He will continue both of these agents.  He is eating well has reasonably good amount of energy.  He is functioning fairly well.  He denies being suicidal he denies any use of alcohol or drugs.  He had a history of smoking a small amount of marijuana but that does not seem to be an issue. Past Medical History:  Past Medical History:  Diagnosis Date  . Anemia   . Aneurysm, aorta, thoracic ([place])   . Anxiety   . Back pain   . Depression   . DLBCL (diffuse large B cell lymphoma) ([place]) 07/17/2015  . Environmental allergies   . History of blood transfusion   . History of bronchitis   . History of chemotherapy   . Hypertension   . Hypothyroidism   . Nocturia   . Peripheral vascular disease ([place])   . Urinary frequency     Past Surgical History:  Procedure Laterality Date  . BONE MARROW BIOPSY    . COLONOSCOPY N/A 08/12/2018   Procedure: COLONOSCOPY;  Surgeon: [name], MD;  [facility];  Service: Endoscopy;  Laterality: N/A;  1030  . HERNIA REPAIR     umblical times 2; inguinal (left)   . IR REMOVAL TUN ACCESS W/ PORT W/O FL MOD SED  07/08/2016  .  POLYPECTOMY  08/12/2018   Procedure: POLYPECTOMY;  Surgeon: [name], MD;  Location: [facility];  Service: Endoscopy;;  colon  . port a cath placement    . RHINOPLASTY     Family History:  Family History  Problem Relation Age of Onset  . Alcohol abuse Father   . Depression Sister   . Anxiety disorder Sister   . Depression Sister   . Anxiety disorder Sister    Family Psychiatric  History:  Social History:  Social History   Substance and Sexual Activity  Alcohol Use Yes  . Alcohol/week: 6.0 standard drinks  . Types: 6 Cans of beer per week   Comment: beer occas     Social History   Substance and Sexual Activity  Drug Use No  . Frequency: 2.0 times per week  . Types: Marijuana   Comment: smokes daily for thirty years. currently takes one puff    Social History   Socioeconomic History  . Marital status: Divorced    Spouse name: Not on file  . Number of children: Not on file  . Years of education: Not on file  . Highest education level: Not on file  Occupational History  . Not on file  Social Needs  . Emergency planning/management officer  strain: Not on file  . Food insecurity    Worry: Not on file    Inability: Not on file  . Transportation needs    Medical: Not on file    Non-medical: Not on file  Tobacco Use  . Smoking status: Never Smoker  . Smokeless tobacco: Never Used  Substance and Sexual Activity  . Alcohol use: Yes    Alcohol/week: 6.0 standard drinks    Types: 6 Cans of beer per week    Comment: beer occas  . Drug use: No    Frequency: 2.0 times per week    Types: Marijuana    Comment: smokes daily for thirty years. currently takes one puff  . Sexual activity: Yes    Partners: Female    Birth control/protection: Condom  Lifestyle  . Physical activity    Days per week: Not on file    Minutes per session: Not on file  . Stress: Not on file  Relationships  . Social Herbalist on phone: Not on file    Gets together: Not on file    Attends  religious service: Not on file    Active member of club or organization: Not on file    Attends meetings of clubs or organizations: Not on file    Relationship status: Not on file  Other Topics Concern  . Not on file  Social History Narrative  . Not on file   Additional Social History:                         Sleep: Fair  Appetite:  Good  Current Medications: Current Outpatient Medications  Medication Sig Dispense Refill  . acetaminophen (TYLENOL) 500 MG tablet Take 1,000 mg by mouth every 6 (six) hours as needed for mild pain.    Marland Kitchen amLODipine (NORVASC) 2.5 MG tablet Take 2.5 mg by mouth every evening.     . Ca Carbonate-Mag Hydroxide (ROLAIDS PO) Take 2 tablets by mouth daily as needed (heartburn).    . Carboxymethylcellul-Glycerin (LUBRICATING EYE DROPS OP) Place 1 drop into both eyes daily as needed (dry eyes).    . clonazePAM (KLONOPIN) 1 MG tablet Take 1 tablet (1 mg total) by mouth 2 (two) times daily. 60 tablet 4  . diphenhydrAMINE (BENADRYL) 25 mg capsule Take 25 mg by mouth at bedtime as needed for allergies or sleep.     Marland Kitchen doxepin (SINEQUAN) 25 MG capsule Take 1 capsule (25 mg total) by mouth at bedtime. 30 capsule 2  . hydrocortisone cream 1 % Apply 1 application topically daily as needed for itching.    Marland Kitchen ibuprofen (ADVIL) 200 MG tablet Take 600-800 mg by mouth every 6 (six) hours as needed for headache or moderate pain.    Marland Kitchen levothyroxine (SYNTHROID, LEVOTHROID) 50 MCG tablet Take 50 mcg by mouth daily before breakfast.     . oxymetazoline (AFRIN) 0.05 % nasal spray Place 1-2 sprays into both nostrils 2 (two) times daily as needed for congestion.    . traMADol (ULTRAM) 50 MG tablet     . valACYclovir (VALTREX) 500 MG tablet Take 500 mg by mouth daily as needed (Herpes Virus).    . zolpidem (AMBIEN) 10 MG tablet Take 1 tablet (10 mg total) by mouth at bedtime. 30 tablet 5   No current facility-administered medications for this visit.     Lab Results: No  results found for this or any previous visit (from the past 64  hour(s)).  Blood Alcohol level:  No results found for: [facility]  Physical Findings: AIMS:  , ,  ,  ,    CIWA:    COWS:     Musculoskeletal: Strength & Muscle Tone: within normal limits Gait & Station: normal Patient leans: N/A  Psychiatric Specialty Exam: ROS  There were no vitals taken for this visit.There is no height or weight on file to calculate BMI.  General Appearance: Casual  Eye Contact::  Good  Speech:  Clear and Coherent  Volume:  Normal  Mood:mild depression  Affect:  Congruent  Thought Process:  Coherent  Orientation:  Full (Time, Place, and Person)  Thought Content:  WDL  Suicidal Thoughts:  No  Homicidal Thoughts:  No  Memory:  NA  Judgement:  Good  Insight:  Fair  Psychomotor Activity:  Normal  Concentration:  Good  Recall:  Good  Fund of Knowledge:Good  Language: Fair  Akathisia:  No  Handed:  Right  AIMS (if indicated):     Assets:  Desire for Improvement  ADL's:  Intact  Cognition: WNL  Sleep:      Treatment Plan Summary: 10/29/2018,  At this time the patient's diagnosis is dysthymic disorder.  He also has problems with insomnia.  So his 2 problems dysthymia which is good to be dealt with by being in therapy and insomnia which is dealt with by taking Ambien and the addition of some Klonopin as well.  The patient is stable.  He is not suicidal.  He is functioning well.  He will return to see me in person in 3 months for 30-minute visit.

## 2018-11-02 DIAGNOSIS — H43393 Other vitreous opacities, bilateral: Secondary | ICD-10-CM | POA: Diagnosis not present

## 2018-11-02 DIAGNOSIS — H26492 Other secondary cataract, left eye: Secondary | ICD-10-CM | POA: Diagnosis not present

## 2018-11-02 DIAGNOSIS — I10 Essential (primary) hypertension: Secondary | ICD-10-CM | POA: Diagnosis not present

## 2018-11-02 DIAGNOSIS — Z9841 Cataract extraction status, right eye: Secondary | ICD-10-CM | POA: Diagnosis not present

## 2018-11-02 DIAGNOSIS — H52223 Regular astigmatism, bilateral: Secondary | ICD-10-CM | POA: Diagnosis not present

## 2018-11-02 DIAGNOSIS — Z961 Presence of intraocular lens: Secondary | ICD-10-CM | POA: Diagnosis not present

## 2018-11-02 DIAGNOSIS — H524 Presbyopia: Secondary | ICD-10-CM | POA: Diagnosis not present

## 2018-11-02 DIAGNOSIS — H35033 Hypertensive retinopathy, bilateral: Secondary | ICD-10-CM | POA: Diagnosis not present

## 2018-11-02 DIAGNOSIS — H5212 Myopia, left eye: Secondary | ICD-10-CM | POA: Diagnosis not present

## 2018-11-02 DIAGNOSIS — H353131 Nonexudative age-related macular degeneration, bilateral, early dry stage: Secondary | ICD-10-CM | POA: Diagnosis not present

## 2018-11-02 DIAGNOSIS — H5201 Hypermetropia, right eye: Secondary | ICD-10-CM | POA: Diagnosis not present

## 2018-11-23 ENCOUNTER — Ambulatory Visit (INDEPENDENT_AMBULATORY_CARE_PROVIDER_SITE_OTHER): Payer: Medicare Other | Admitting: Licensed Clinical Social Worker

## 2018-11-23 ENCOUNTER — Other Ambulatory Visit: Payer: Self-pay

## 2018-11-23 DIAGNOSIS — F341 Dysthymic disorder: Secondary | ICD-10-CM

## 2018-11-24 ENCOUNTER — Encounter (HOSPITAL_COMMUNITY): Payer: Self-pay | Admitting: Licensed Clinical Social Worker

## 2018-11-24 NOTE — Progress Notes (Signed)
Virtual Visit via Video Note  I connected with Douglas Bryant on 11/24/18 at  2:00 PM EDT by a video enabled telemedicine application and verified that I am speaking with the correct person using two identifiers.  Location: Patient: Home Provider: Office   I discussed the limitations of evaluation and management by telemedicine and the availability of in person appointments. The patient expressed understanding and agreed to proceed.   THERAPIST PROGRESS NOTE  Session Time: 2:00 pm-2:50 pm  Participation Level: Active  Behavioral Response: CasualAlertAnxious  Type of Therapy: Individual Therapy  Treatment Goals addressed: Anxiety  Interventions: CBT and Solution Focused  Summary: Douglas Bryant is a 68 y.o. male who presents  that presents oriented x5 (person, place, situation, time and object), alert, anxious, casually dressed, appropriately groomed, and cooperative to address mood and anxiety. Patient has a history of medical treatment including lymphoma. Patient has a history of mental health treatment including outpatient therapy and medication management. Patient denies symptoms of mania. He admits to passive thoughts of suicide but denies a plan and means. Patient denies homicidal ideations. Patient denies psychosis including auditory and visual hallucination. Patient denies substance abuse. Patient is at low risk for lethality at this time.   Physically: Patient continues to sleep a lot. He is working on that. He has been going for a walk which has helped him. Patient has a desire to buy a bike or do another exercise so that walking does not get boring for him.  Spiritually/values: No issues identified.  Relationships: Patient is getting along with others. Patient admitted that he did find himself blaming others for his frustrations. After processing this, patient recognized that he was frustrated at situations and not people.   Emotional/Mental/Behavior: Patient's mood is  stable. Patient was able to complete the depression self care action plan. He recognized that exercise, and reaching out to others that is helpful to manage his mood. Patient remembered that he had taken a meditation/mindfulness class years prior and it was helpful. Patient wants to try to meditate again. Patient was experiencing frustration over the weeks prior. After discussion, patient understood that experiencing anger/frustration is part of experiencing the full range of emotions in life. Patient did not express his anger over things such as his sister's dog being a lot of responsibility and having a bad smell or having to wear a mask at times.   Patient engaged in session. He responded well to interventions. 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 Patient will continue in outpatient therapy due to being the least restrictive service to meet his needs. Patient made minimal  progress on his goal at this time.   Suicidal/Homicidal: Negativewithout intent/plan  Therapist Response: Therapist reviewed patient's recent thoughts and behaviors. Therapist utilized CBT to address anxiety and mood. Therapist processed patient's feelings to identify triggers for mood. Therapist provided psychoeducation on sleep. Therapist assisted patient in identifying what has gone well and went over the "Depression Self Care action plan" to help patient further improve his mood or stop his mood from getting worse.   Plan: Return again in 3 weeks.   Diagnosis: Axis I: Dysthymic Disorder    Axis II: No diagnosis  I discussed the assessment and treatment plan with the patient. The patient was provided an opportunity to ask questions and all were answered. The patient agreed with the plan and demonstrated an understanding of the instructions.   The patient was advised to call back or seek  an in-person evaluation if the symptoms worsen or if the condition fails to  improve as anticipated.  I provided 45 minutes of non-face-to-face time during this encounter.   Glori Bickers, LCSW 11/24/2018

## 2018-12-14 ENCOUNTER — Ambulatory Visit (HOSPITAL_COMMUNITY): Payer: Medicare Other | Admitting: Licensed Clinical Social Worker

## 2018-12-14 ENCOUNTER — Other Ambulatory Visit: Payer: Self-pay

## 2019-01-04 ENCOUNTER — Ambulatory Visit (HOSPITAL_COMMUNITY): Payer: Medicare Other | Admitting: Licensed Clinical Social Worker

## 2019-02-02 ENCOUNTER — Ambulatory Visit (INDEPENDENT_AMBULATORY_CARE_PROVIDER_SITE_OTHER): Payer: Medicare Other | Admitting: Psychiatry

## 2019-02-02 ENCOUNTER — Other Ambulatory Visit: Payer: Self-pay

## 2019-02-02 DIAGNOSIS — F4322 Adjustment disorder with anxiety: Secondary | ICD-10-CM

## 2019-02-02 DIAGNOSIS — G4700 Insomnia, unspecified: Secondary | ICD-10-CM

## 2019-02-02 DIAGNOSIS — F341 Dysthymic disorder: Secondary | ICD-10-CM

## 2019-02-02 MED ORDER — DOXEPIN HCL 25 MG PO CAPS: 25.0000 mg | ORAL_CAPSULE | Freq: Every day | ORAL | 2 refills | Status: DC

## 2019-02-02 MED ORDER — CLONAZEPAM 1 MG PO TABS: 1.0000 mg | ORAL_TABLET | Freq: Two times a day (BID) | ORAL | 4 refills | Status: DC

## 2019-02-02 MED ORDER — ZOLPIDEM TARTRATE 10 MG PO TABS: 10.0000 mg | ORAL_TABLET | Freq: Every day | ORAL | 5 refills | Status: DC

## 2019-02-02 NOTE — Progress Notes (Signed)
[name], male   DOB: Sep 14, 1950, 69 y.o.   MRN: [id number] [facility] MD Progress Note  02/02/2019 3:33 PM Douglas Bryant  MRN:  [id number] Subjective: Lethargic Principal Problem: Dysthymic disorder   Today the patient seems to be at his baseline. Is very talkative. He was seeing a therapist and seen in 4 or 5 times but unfortunately due to communication issues he stopped seeing him. I believe the best thing for this patient is to go back to seeing a therapist. Is issues and that he met in new woman is actually his best friend's wife. His best friend had died and he befriended his wife. Patient has another girlfriend reports lives in [place]. The patient is torn between which want to be involved with. Patient is sleeping and eating well. He now plays the bongos. The patient is reading a lot. He watches TV some. Overall he's not had not is not suicidal. He drinks no alcohol uses no drugs at this time. He is very expressive. Is not suicidal. Past Medical History:  Diagnosis Date  . Anemia   . Aneurysm, aorta, thoracic ([place])   . Anxiety   . Back pain   . Depression   . DLBCL (diffuse large B cell lymphoma) ([place]) 07/17/2015  . Environmental allergies   . History of blood transfusion   . History of bronchitis   . History of chemotherapy   . Hypertension   . Hypothyroidism   . Nocturia   . Peripheral vascular disease ([place])   . Urinary frequency     Past Surgical History:  Procedure Laterality Date  . BONE MARROW BIOPSY    . COLONOSCOPY N/A 08/12/2018   Procedure: COLONOSCOPY;  Surgeon: [name], MD;  [facility];  Service: Endoscopy;  Laterality: N/A;  1030  . HERNIA REPAIR     umblical times 2; inguinal (left)   . IR REMOVAL TUN ACCESS W/ PORT W/O FL MOD SED  07/08/2016  . POLYPECTOMY  08/12/2018   Procedure: POLYPECTOMY;  Surgeon: [name], MD;  [facility];  Service: Endoscopy;;  colon  . port a cath placement    . RHINOPLASTY      Family History:  Family History  Problem Relation Age of Onset  . Alcohol abuse Father   . Depression Sister   . Anxiety disorder Sister   . Depression Sister   . Anxiety disorder Sister    Family Psychiatric  History:  Social History:  Social History   Substance and Sexual Activity  Alcohol Use Yes  . Alcohol/week: 6.0 standard drinks  . Types: 6 Cans of beer per week   Comment: beer occas     Social History   Substance and Sexual Activity  Drug Use No  . Frequency: 2.0 times per week  . Types: Marijuana   Comment: smokes daily for thirty years. currently takes one puff    Social History   Socioeconomic History  . Marital status: Divorced    Spouse name: Not on file  . Number of children: Not on file  . Years of education: Not on file  . Highest education level: Not on file  Occupational History  . Not on file  Tobacco Use  . Smoking status: Never Smoker  . Smokeless tobacco: Never Used  Substance and Sexual Activity  . Alcohol use: Yes    Alcohol/week: 6.0 standard drinks    Types: 6 Cans of beer per week  Comment: beer occas  . Drug use: No    Frequency: 2.0 times per week    Types: Marijuana    Comment: smokes daily for thirty years. currently takes one puff  . Sexual activity: Yes    Partners: Female    Birth control/protection: Condom  Other Topics Concern  . Not on file  Social History Narrative  . Not on file   Social Determinants of Health   Financial Resource Strain:   . Difficulty of Paying Living Expenses: Not on file  Food Insecurity:   . Worried About Charity fundraiser in the Last Year: Not on file  . Ran Out of Food in the Last Year: Not on file  Transportation Needs:   . Lack of Transportation (Medical): Not on file  . Lack of Transportation (Non-Medical): Not on file  Physical Activity:   . Days of Exercise per Week: Not on file  . Minutes of Exercise per Session: Not on file  Stress:   . Feeling of Stress : Not on file   Social Connections:   . Frequency of Communication with Friends and Family: Not on file  . Frequency of Social Gatherings with Friends and Family: Not on file  . Attends Religious Services: Not on file  . Active Member of Clubs or Organizations: Not on file  . Attends Archivist Meetings: Not on file  . Marital Status: Not on file   Additional Social History:                         Sleep: Fair  Appetite:  Good  Current Medications: Current Outpatient Medications  Medication Sig Dispense Refill  . acetaminophen (TYLENOL) 500 MG tablet Take 1,000 mg by mouth every 6 (six) hours as needed for mild pain.    Marland Kitchen amLODipine (NORVASC) 2.5 MG tablet Take 2.5 mg by mouth every evening.     . Ca Carbonate-Mag Hydroxide (ROLAIDS PO) Take 2 tablets by mouth daily as needed (heartburn).    . Carboxymethylcellul-Glycerin (LUBRICATING EYE DROPS OP) Place 1 drop into both eyes daily as needed (dry eyes).    . clonazePAM (KLONOPIN) 1 MG tablet Take 1 tablet (1 mg total) by mouth 2 (two) times daily. 60 tablet 4  . diphenhydrAMINE (BENADRYL) 25 mg capsule Take 25 mg by mouth at bedtime as needed for allergies or sleep.     Marland Kitchen doxepin (SINEQUAN) 25 MG capsule Take 1 capsule (25 mg total) by mouth at bedtime. 30 capsule 2  . hydrocortisone cream 1 % Apply 1 application topically daily as needed for itching.    Marland Kitchen ibuprofen (ADVIL) 200 MG tablet Take 600-800 mg by mouth every 6 (six) hours as needed for headache or moderate pain.    Marland Kitchen levothyroxine (SYNTHROID, LEVOTHROID) 50 MCG tablet Take 50 mcg by mouth daily before breakfast.     . oxymetazoline (AFRIN) 0.05 % nasal spray Place 1-2 sprays into both nostrils 2 (two) times daily as needed for congestion.    . traMADol (ULTRAM) 50 MG tablet     . valACYclovir (VALTREX) 500 MG tablet Take 500 mg by mouth daily as needed (Herpes Virus).    . zolpidem (AMBIEN) 10 MG tablet Take 1 tablet (10 mg total) by mouth at bedtime. 30 tablet 5   No  current facility-administered medications for this visit.    Lab Results: No results found for this or any previous visit (from the past 48 hour(s)).  Blood  Alcohol level:  No results found for: [facility]  Physical Findings: AIMS:  , ,  ,  ,    CIWA:    COWS:     Musculoskeletal: Strength & Muscle Tone: within normal limits Gait & Station: normal Patient leans: N/A  Psychiatric Specialty Exam: ROS  There were no vitals taken for this visit.There is no height or weight on file to calculate BMI.  General Appearance: Casual  Eye Contact::  Good  Speech:  Clear and Coherent  Volume:  Normal  Mood:mild depression  Affect:  Congruent  Thought Process:  Coherent  Orientation:  Full (Time, Place, and Person)  Thought Content:  WDL  Suicidal Thoughts:  No  Homicidal Thoughts:  No  Memory:  NA  Judgement:  Good  Insight:  Fair  Psychomotor Activity:  Normal  Concentration:  Good  Recall:  Good  Fund of Knowledge:Good  Language: Fair  Akathisia:  No  Handed:  Right  AIMS (if indicated):     Assets:  Desire for Improvement  ADL's:  Intact  Cognition: WNL  Sleep:      Treatment Plan Summary: 02/02/2019,   Today the patient is #1 problem is that of an adjustment disorder with anxiety. At this time we'll continue taking relatively low dose Klonopin. A recommended he get back into psychotherapy.The second problem is that of insomnia. Initial continue taking Ambien and doxepin. Together they seem to work very well.Is will be reevaluated in 4 months. I believe he is very stable. Is functioning well.

## 2019-02-03 ENCOUNTER — Ambulatory Visit (HOSPITAL_COMMUNITY): Payer: Medicare Other | Admitting: Psychiatry

## 2019-05-03 DIAGNOSIS — H353131 Nonexudative age-related macular degeneration, bilateral, early dry stage: Secondary | ICD-10-CM | POA: Diagnosis not present

## 2019-05-25 DIAGNOSIS — M549 Dorsalgia, unspecified: Secondary | ICD-10-CM | POA: Diagnosis not present

## 2019-05-25 DIAGNOSIS — I10 Essential (primary) hypertension: Secondary | ICD-10-CM | POA: Diagnosis not present

## 2019-05-25 DIAGNOSIS — Z789 Other specified health status: Secondary | ICD-10-CM | POA: Diagnosis not present

## 2019-05-25 DIAGNOSIS — Z6825 Body mass index (BMI) 25.0-25.9, adult: Secondary | ICD-10-CM | POA: Diagnosis not present

## 2019-05-25 DIAGNOSIS — K5909 Other constipation: Secondary | ICD-10-CM | POA: Diagnosis not present

## 2019-05-25 DIAGNOSIS — Z299 Encounter for prophylactic measures, unspecified: Secondary | ICD-10-CM | POA: Diagnosis not present

## 2019-05-25 DIAGNOSIS — C8590 Non-Hodgkin lymphoma, unspecified, unspecified site: Secondary | ICD-10-CM | POA: Diagnosis not present

## 2019-06-03 ENCOUNTER — Ambulatory Visit (HOSPITAL_COMMUNITY): Payer: Medicare Other | Admitting: Psychiatry

## 2019-06-08 DIAGNOSIS — H26491 Other secondary cataract, right eye: Secondary | ICD-10-CM | POA: Diagnosis not present

## 2019-06-08 DIAGNOSIS — H35033 Hypertensive retinopathy, bilateral: Secondary | ICD-10-CM | POA: Diagnosis not present

## 2019-06-08 DIAGNOSIS — H353132 Nonexudative age-related macular degeneration, bilateral, intermediate dry stage: Secondary | ICD-10-CM | POA: Diagnosis not present

## 2019-06-08 DIAGNOSIS — H26493 Other secondary cataract, bilateral: Secondary | ICD-10-CM | POA: Diagnosis not present

## 2019-06-08 DIAGNOSIS — H43393 Other vitreous opacities, bilateral: Secondary | ICD-10-CM | POA: Diagnosis not present

## 2019-06-14 ENCOUNTER — Other Ambulatory Visit: Payer: Self-pay

## 2019-06-14 ENCOUNTER — Telehealth (INDEPENDENT_AMBULATORY_CARE_PROVIDER_SITE_OTHER): Payer: Medicare Other | Admitting: Psychiatry

## 2019-06-14 DIAGNOSIS — F4323 Adjustment disorder with mixed anxiety and depressed mood: Secondary | ICD-10-CM

## 2019-06-14 MED ORDER — ZOLPIDEM TARTRATE 10 MG PO TABS: 10.0000 mg | ORAL_TABLET | Freq: Every day | ORAL | 5 refills | Status: DC

## 2019-06-14 MED ORDER — DOXEPIN HCL 25 MG PO CAPS: 25.0000 mg | ORAL_CAPSULE | Freq: Every day | ORAL | 2 refills | Status: DC

## 2019-06-14 MED ORDER — LORAZEPAM 1 MG PO TABS: 1.0000 mg | ORAL_TABLET | Freq: Two times a day (BID) | ORAL | 4 refills | Status: DC

## 2019-06-14 NOTE — Progress Notes (Signed)
[name], male   DOB: [birth year], 69 y.o.   MRN: [id number] [facility] MD Progress Note  06/14/2019 2:50 PM Douglas Bryant  MRN:  [id number] Subjective: Lethargic Principal Problem: Dysthymic disorder  At this time the patient is at his baseline.  He is having a little bit of problems with Klonopin.  At this time we will switch to Ativan at the appropriate dose.  Patient son is graduating from college got a Development worker, community.  Patient is very depressed.  The patient generally is sleeping fairly well and eating well.  He is got good energy.  He denies any psychotic symptoms.  He denies the use of alcohol or significant use of drugs.  He is positive and optimistic.  Today was a short visit.  He is sleeping well.  He takes Ambien and doxepin.  Patient still plays guitar and stays active.  He is functioning well. Past Medical History:  Diagnosis Date  . Anemia   . Aneurysm, aorta, thoracic ([place])   . Anxiety   . Back pain   . Depression   . DLBCL (diffuse large B cell lymphoma) ([place]) 07/17/2015  . Environmental allergies   . History of blood transfusion   . History of bronchitis   . History of chemotherapy   . Hypertension   . Hypothyroidism   . Nocturia   . Peripheral vascular disease ([place])   . Urinary frequency     Past Surgical History:  Procedure Laterality Date  . BONE MARROW BIOPSY    . COLONOSCOPY N/A 08/12/2018   Procedure: COLONOSCOPY;  Surgeon: [name], MD;  [facility];  Service: Endoscopy;  Laterality: N/A;  1030  . HERNIA REPAIR     umblical times 2; inguinal (left)   . IR REMOVAL TUN ACCESS W/ PORT W/O FL MOD SED  07/08/2016  . POLYPECTOMY  08/12/2018   Procedure: POLYPECTOMY;  Surgeon: [name], MD;  [facility];  Service: Endoscopy;;  colon  . port a cath placement    . RHINOPLASTY     Family History:  Family History  Problem Relation Age of Onset  . Alcohol abuse Father   . Depression Sister   . Anxiety  disorder Sister   . Depression Sister   . Anxiety disorder Sister    Family Psychiatric  History:  Social History:  Social History   Substance and Sexual Activity  Alcohol Use Yes  . Alcohol/week: 6.0 standard drinks  . Types: 6 Cans of beer per week   Comment: beer occas     Social History   Substance and Sexual Activity  Drug Use No  . Frequency: 2.0 times per week  . Types: Marijuana   Comment: smokes daily for thirty years. currently takes one puff    Social History   Socioeconomic History  . Marital status: Divorced    Spouse name: Not on file  . Number of children: Not on file  . Years of education: Not on file  . Highest education level: Not on file  Occupational History  . Not on file  Tobacco Use  . Smoking status: Never Smoker  . Smokeless tobacco: Never Used  Substance and Sexual Activity  . Alcohol use: Yes    Alcohol/week: 6.0 standard drinks    Types: 6 Cans of beer per week    Comment: beer occas  . Drug use: No    Frequency: 2.0 times per week    Types: Marijuana  Comment: smokes daily for thirty years. currently takes one puff  . Sexual activity: Yes    Partners: Female    Birth control/protection: Condom  Other Topics Concern  . Not on file  Social History Narrative  . Not on file   Social Determinants of Health   Financial Resource Strain:   . Difficulty of Paying Living Expenses:   Food Insecurity:   . Worried About Charity fundraiser in the Last Year:   . Arboriculturist in the Last Year:   Transportation Needs:   . Film/video editor (Medical):   Marland Kitchen Lack of Transportation (Non-Medical):   Physical Activity:   . Days of Exercise per Week:   . Minutes of Exercise per Session:   Stress:   . Feeling of Stress :   Social Connections:   . Frequency of Communication with Friends and Family:   . Frequency of Social Gatherings with Friends and Family:   . Attends Religious Services:   . Active Member of Clubs or Organizations:    . Attends Archivist Meetings:   Marland Kitchen Marital Status:    Additional Social History:                         Sleep: Fair  Appetite:  Good  Current Medications: Current Outpatient Medications  Medication Sig Dispense Refill  . acetaminophen (TYLENOL) 500 MG tablet Take 1,000 mg by mouth every 6 (six) hours as needed for mild pain.    Marland Kitchen amLODipine (NORVASC) 2.5 MG tablet Take 2.5 mg by mouth every evening.     . Ca Carbonate-Mag Hydroxide (ROLAIDS PO) Take 2 tablets by mouth daily as needed (heartburn).    . Carboxymethylcellul-Glycerin (LUBRICATING EYE DROPS OP) Place 1 drop into both eyes daily as needed (dry eyes).    . diphenhydrAMINE (BENADRYL) 25 mg capsule Take 25 mg by mouth at bedtime as needed for allergies or sleep.     Marland Kitchen doxepin (SINEQUAN) 25 MG capsule Take 1 capsule (25 mg total) by mouth at bedtime. 30 capsule 2  . hydrocortisone cream 1 % Apply 1 application topically daily as needed for itching.    Marland Kitchen ibuprofen (ADVIL) 200 MG tablet Take 600-800 mg by mouth every 6 (six) hours as needed for headache or moderate pain.    Marland Kitchen levothyroxine (SYNTHROID, LEVOTHROID) 50 MCG tablet Take 50 mcg by mouth daily before breakfast.     . LORazepam (ATIVAN) 1 MG tablet Take 1 tablet (1 mg total) by mouth 2 (two) times daily. 60 tablet 4  . oxymetazoline (AFRIN) 0.05 % nasal spray Place 1-2 sprays into both nostrils 2 (two) times daily as needed for congestion.    . traMADol (ULTRAM) 50 MG tablet     . valACYclovir (VALTREX) 500 MG tablet Take 500 mg by mouth daily as needed (Herpes Virus).    . zolpidem (AMBIEN) 10 MG tablet Take 1 tablet (10 mg total) by mouth at bedtime. 30 tablet 5   No current facility-administered medications for this visit.    Lab Results: No results found for this or any previous visit (from the past 48 hour(s)).  Blood Alcohol level:  No results found for: [facility]  Physical Findings: AIMS:  , ,  ,  ,    CIWA:    COWS:      Musculoskeletal: Strength & Muscle Tone: within normal limits Gait & Station: normal Patient leans: N/A  Psychiatric Specialty Exam: ROS  There were no vitals taken for this visit.There is no height or weight on file to calculate BMI.  General Appearance: Casual  Eye Contact::  Good  Speech:  Clear and Coherent  Volume:  Normal  Mood:mild depression  Affect:  Congruent  Thought Process:  Coherent  Orientation:  Full (Time, Place, and Person)  Thought Content:  WDL  Suicidal Thoughts:  No  Homicidal Thoughts:  No  Memory:  NA  Judgement:  Good  Insight:  Fair  Psychomotor Activity:  Normal  Concentration:  Good  Recall:  Good  Fund of Knowledge:Good  Language: Fair  Akathisia:  No  Handed:  Right  AIMS (if indicated):     Assets:  Desire for Improvement  ADL's:  Intact  Cognition: WNL  Sleep:      Treatment Plan Summary: 06/14/2019,  \ Today the patient is #1 problem is adjustment disorder with an anxious mood state.  Today he will switch Klonopin for Ativan.  Will take Ativan 1 mg twice daily.  His second problem is that of insomnia.  The patient is very well taking Ambien and doxepin.  Patient is functioning well and will return to see me in about 4 months.

## 2019-06-16 ENCOUNTER — Other Ambulatory Visit: Payer: Self-pay | Admitting: Thoracic Surgery (Cardiothoracic Vascular Surgery)

## 2019-06-16 DIAGNOSIS — I712 Thoracic aortic aneurysm, without rupture, unspecified: Secondary | ICD-10-CM

## 2019-07-07 ENCOUNTER — Ambulatory Visit
Admission: RE | Admit: 2019-07-07 | Discharge: 2019-07-07 | Disposition: A | Discharge status: Home / Self Care | Payer: Medicare Other | Source: Ambulatory Visit | Attending: Thoracic Surgery (Cardiothoracic Vascular Surgery) | Admitting: Thoracic Surgery (Cardiothoracic Vascular Surgery)

## 2019-07-07 DIAGNOSIS — I712 Thoracic aortic aneurysm, without rupture, unspecified: Secondary | ICD-10-CM

## 2019-07-07 MED ORDER — IOPAMIDOL (ISOVUE-370) INJECTION 76%
75.0000 mL | Freq: Once | INTRAVENOUS | Status: AC | PRN
  Administered 2019-07-07: 75 mL via INTRAVENOUS

## 2019-07-12 ENCOUNTER — Ambulatory Visit: Payer: Medicare Other | Admitting: Thoracic Surgery (Cardiothoracic Vascular Surgery)

## 2019-07-12 ENCOUNTER — Encounter: Payer: Self-pay | Admitting: Thoracic Surgery (Cardiothoracic Vascular Surgery)

## 2019-07-12 ENCOUNTER — Ambulatory Visit (INDEPENDENT_AMBULATORY_CARE_PROVIDER_SITE_OTHER): Payer: Medicare Other | Admitting: Thoracic Surgery (Cardiothoracic Vascular Surgery)

## 2019-07-12 ENCOUNTER — Other Ambulatory Visit: Payer: Self-pay

## 2019-07-12 VITALS — BP 146/91 | HR 93 | Temp 97.3°F | Ht 73.0 in | Wt 193.4 lb

## 2019-07-12 DIAGNOSIS — I7121 Aneurysm of the ascending aorta, without rupture: Secondary | ICD-10-CM

## 2019-07-12 DIAGNOSIS — I712 Thoracic aortic aneurysm, without rupture: Secondary | ICD-10-CM | POA: Diagnosis not present

## 2019-07-12 MED ORDER — METOPROLOL SUCCINATE ER 25 MG PO TB24
25.0000 mg | ORAL_TABLET | Freq: Every day | ORAL | 3 refills | Status: DC
Start: 2019-07-12 — End: 2020-08-02

## 2019-07-12 NOTE — Progress Notes (Signed)
[address]       [place] [address]             [phone]      HPI: Douglas Bryant returns for a scheduled follow-up visit  Douglas Bryant is a 69 year old man with a history of hypertension, aortic atherosclerosis, ascending aortic aneurysm, diffuse large cell lymphoma, anxiety, and depression.  He had chemotherapy for his non-Hodgkin's lymphoma.  On a CT TEE in 2017 he was found to have a 4.3 cm ascending aneurysm.  I have been following him since that time.  I last saw him in June 2020.  The aneurysm was stable.  He says that he has stopped taking Norvasc because it made him feel poorly.  He complained of ringing in his ears and general malaise.  He says symptoms have improved since he stopped that.  Denies chest pain, pressure, tightness, or peripheral edema.  He does complain of some numbness and weakness in the left leg.  He says he has had some x-rays done of his back but has not followed up on that yet.  Past Medical History:  Diagnosis Date  . Anemia   . Aneurysm, aorta, thoracic ([place])   . Anxiety   . Back pain   . Depression   . DLBCL (diffuse large B cell lymphoma) ([place]) 07/17/2015  . Environmental allergies   . History of blood transfusion   . History of bronchitis   . History of chemotherapy   . Hypertension   . Hypothyroidism   . Nocturia   . Peripheral vascular disease ([place])   . Urinary frequency     Current Outpatient Medications  Medication Sig Dispense Refill  . acetaminophen (TYLENOL) 500 MG tablet Take 1,000 mg by mouth every 6 (six) hours as needed for mild pain.    . Ca Carbonate-Mag Hydroxide (ROLAIDS PO) Take 2 tablets by mouth daily as needed (heartburn).    . Carboxymethylcellul-Glycerin (LUBRICATING EYE DROPS OP) Place 1 drop into both eyes daily as needed (dry eyes).    . diphenhydrAMINE (BENADRYL) 25 mg capsule Take 25 mg by mouth at bedtime as needed for allergies or sleep.     Marland Kitchen doxepin (SINEQUAN) 25 MG capsule Take 1  capsule (25 mg total) by mouth at bedtime. 30 capsule 2  . hydrocortisone cream 1 % Apply 1 application topically daily as needed for itching.    Marland Kitchen ibuprofen (ADVIL) 200 MG tablet Take 600-800 mg by mouth every 6 (six) hours as needed for headache or moderate pain.    Marland Kitchen levothyroxine (SYNTHROID, LEVOTHROID) 50 MCG tablet Take 50 mcg by mouth daily before breakfast.     . LORazepam (ATIVAN) 1 MG tablet Take 1 tablet (1 mg total) by mouth 2 (two) times daily. 60 tablet 4  . oxymetazoline (AFRIN) 0.05 % nasal spray Place 1-2 sprays into both nostrils 2 (two) times daily as needed for congestion.    . traMADol (ULTRAM) 50 MG tablet     . valACYclovir (VALTREX) 500 MG tablet Take 500 mg by mouth daily as needed (Herpes Virus).    . zolpidem (AMBIEN) 10 MG tablet Take 1 tablet (10 mg total) by mouth at bedtime. 30 tablet 5  . metoprolol succinate (TOPROL XL) 25 MG 24 hr tablet Take 1 tablet (25 mg total) by mouth daily. 30 tablet 3   No current facility-administered medications for this visit.    Physical Exam BP (!) 146/91 (BP Location: Right Arm, Patient  Position: Sitting, Cuff Size: Normal)   Pulse 93   Temp (!) 97.3 F (36.3 C)   Ht 6\' 1"  (1.854 m)   Wt 193 lb 6.4 oz (87.7 kg)   SpO2 94% Comment: RA  BMI 25.67 kg/m  69 year old man in no acute distress Alert and oriented x3 with no focal deficits Lungs clear with equal breath sounds bilaterally Cardiac regular rate and rhythm normal S1-S2 no rubs or murmurs No peripheral edema, 2+ PT and DP pulses bilaterally  Diagnostic Tests: CT ANGIOGRAPHY CHEST WITH CONTRAST  TECHNIQUE: Multidetector CT imaging of the chest was performed using the standard protocol during bolus administration of intravenous contrast. Multiplanar CT image reconstructions and MIPs were obtained to evaluate the vascular anatomy.  CONTRAST:  83mL ISOVUE-370 IOPAMIDOL (ISOVUE-370) INJECTION 76%  Creatinine was obtained on site at [facility] at [address]  Results: Creatinine 1.0 mg/dL.  COMPARISON:  06/24/2018  FINDINGS: Cardiovascular: The ascending thoracic aorta measures 4.4 x 4.4 cm, not appreciably changed since prior exam. Normal caliber of the aortic arch and descending thoracic aorta. No dissection.  The heart is unremarkable without pericardial effusion. Mild atherosclerosis of the aorta and coronary vessels.  Mediastinum/Nodes: No enlarged mediastinal, hilar, or axillary lymph nodes. Thyroid gland, trachea, and esophagus demonstrate no significant findings.  Lungs/Pleura: Chronic lingular atelectasis or scarring. No acute airspace disease, effusion, or pneumothorax. Central airways are patent.  Upper Abdomen: No acute abnormality.  Musculoskeletal: No acute or destructive bony lesions. Reconstructed images demonstrate no additional findings.  Review of the MIP images confirms the above findings.  IMPRESSION: 1. Stable 4.4 cm ascending thoracic aortic aneurysm. No evidence of dissection. 2. Aortic Atherosclerosis (ICD10-I70.0). Coronary artery atherosclerosis.   Electronically Signed   By: Randa Ngo M.D.   On: 07/07/2019 15:36 I personally reviewed the CT images and concur with the findings noted above  Impression: Douglas Bryant is a 69 year old man with a history of hypertension, aortic atherosclerosis, ascending aortic aneurysm, diffuse large cell lymphoma, anxiety, and depression.    Ascending aortic atherosclerosis/ascending aneurysm-stable at 4.4 cm.  Needs continued annual follow-up.  Blood pressure control is paramount.  Hypertension-blood pressure elevated.  Slightly higher in left arm than right.  I do not see any significant vascular stenoses in the proximal vessels that are visible on the CT angio.  Stop Norvasc due to side effects.  I might try him on Toprol-XL 25 mg daily as he does need to be on a blood pressure medication.  Left leg numbness/weakness-has  good pulses.  Not vascular in nature.  Will follow up with primary regarding x-rays of the back that had been done previously  Plan: Toprol-XL 25 mg p.o. daily Follow-up with primary regarding left leg issues Return in 1 year with CT angio chest  Melrose Nakayama, MD [facility] [phone]

## 2019-08-09 DIAGNOSIS — M545 Low back pain: Secondary | ICD-10-CM | POA: Diagnosis not present

## 2019-08-22 DIAGNOSIS — Z6825 Body mass index (BMI) 25.0-25.9, adult: Secondary | ICD-10-CM | POA: Diagnosis not present

## 2019-08-22 DIAGNOSIS — Z713 Dietary counseling and surveillance: Secondary | ICD-10-CM | POA: Diagnosis not present

## 2019-08-22 DIAGNOSIS — M48061 Spinal stenosis, lumbar region without neurogenic claudication: Secondary | ICD-10-CM | POA: Diagnosis not present

## 2019-08-22 DIAGNOSIS — Z299 Encounter for prophylactic measures, unspecified: Secondary | ICD-10-CM | POA: Diagnosis not present

## 2019-08-22 DIAGNOSIS — I10 Essential (primary) hypertension: Secondary | ICD-10-CM | POA: Diagnosis not present

## 2019-09-02 DIAGNOSIS — H16141 Punctate keratitis, right eye: Secondary | ICD-10-CM | POA: Diagnosis not present

## 2019-09-12 DIAGNOSIS — I10 Essential (primary) hypertension: Secondary | ICD-10-CM | POA: Diagnosis not present

## 2019-09-12 DIAGNOSIS — M545 Low back pain: Secondary | ICD-10-CM | POA: Diagnosis not present

## 2019-09-12 DIAGNOSIS — M4316 Spondylolisthesis, lumbar region: Secondary | ICD-10-CM | POA: Diagnosis not present

## 2019-09-12 DIAGNOSIS — M48062 Spinal stenosis, lumbar region with neurogenic claudication: Secondary | ICD-10-CM | POA: Diagnosis not present

## 2019-09-12 DIAGNOSIS — Z6825 Body mass index (BMI) 25.0-25.9, adult: Secondary | ICD-10-CM | POA: Diagnosis not present

## 2019-09-14 ENCOUNTER — Other Ambulatory Visit: Payer: Self-pay

## 2019-09-14 ENCOUNTER — Ambulatory Visit (INDEPENDENT_AMBULATORY_CARE_PROVIDER_SITE_OTHER): Payer: Medicare Other | Admitting: Psychiatry

## 2019-09-14 DIAGNOSIS — F341 Dysthymic disorder: Secondary | ICD-10-CM

## 2019-09-14 DIAGNOSIS — F4323 Adjustment disorder with mixed anxiety and depressed mood: Secondary | ICD-10-CM | POA: Diagnosis not present

## 2019-09-14 MED ORDER — LORAZEPAM 1 MG PO TABS
1.0000 mg | ORAL_TABLET | Freq: Two times a day (BID) | ORAL | 4 refills | Status: DC
Start: 2019-09-14 — End: 2019-12-14

## 2019-09-14 MED ORDER — DOXEPIN HCL 25 MG PO CAPS: 25.0000 mg | ORAL_CAPSULE | Freq: Every day | ORAL | 2 refills | Status: DC

## 2019-09-14 MED ORDER — ZOLPIDEM TARTRATE 10 MG PO TABS: 10.0000 mg | ORAL_TABLET | Freq: Every day | ORAL | 5 refills | Status: DC

## 2019-09-14 NOTE — Progress Notes (Signed)
[name], male   DOB: November 17, 1950, 69 y.o.   MRN: [id number] [facility] MD Progress Note  09/14/2019 3:48 PM Douglas Bryant  MRN:  [id number] Subjective: Lethargic Principal Problem: Dysthymic disorder   Today the patient seems to be doing worse.  He seems more depressed.  He seems to be having a hard time getting out of bed.  Is not clear he lives any new stressor other than his getting older.  He has minimal contact with his girlfriend in [place].  He does all work around the house taking care of his disabled sister.  The patient is energy seems to be reduced.  His ability to enjoy things seem to be reduced.  He gets obsessed every morning worrying about things that happened in the distant past.  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  He feels guilty easily.  Unfortunately he had a disconnect with his therapist.  His therapist and him could not connect over the phone and the patient felt insulted that the therapist did not make an attempt to contact him.  Although it should be noted the patient did try to contact the therapist after the visit.  Nonetheless the patient does not feel that he wants to return to that therapist.  Today given the name of Douglas Bryant for therapy. Past Medical History:  Diagnosis Date  . Anemia   . Aneurysm, aorta, thoracic ([place])   . Anxiety   . Back pain   . Depression   . DLBCL (diffuse large B cell lymphoma) ([place]) 07/17/2015  . Environmental allergies   . History of blood transfusion   . History of bronchitis   . History of chemotherapy   . Hypertension   . Hypothyroidism   . Nocturia   . Peripheral vascular disease ([place])   . Urinary frequency     Past Surgical History:  Procedure Laterality Date  . BONE MARROW BIOPSY    . COLONOSCOPY N/A 08/12/2018   Procedure: COLONOSCOPY;  Surgeon: [name], MD;  [facility];  Service: Endoscopy;  Laterality: N/A;  1030  . HERNIA REPAIR     umblical times 2;  inguinal (left)   . IR REMOVAL TUN ACCESS W/ PORT W/O FL MOD SED  07/08/2016  . POLYPECTOMY  08/12/2018   Procedure: POLYPECTOMY;  Surgeon: [name], MD;  [facility];  Service: Endoscopy;;  colon  . port a cath placement    . RHINOPLASTY     Family History:  Family History  Problem Relation Age of Onset  . Alcohol abuse Father   . Depression Sister   . Anxiety disorder Sister   . Depression Sister   . Anxiety disorder Sister    Family Psychiatric  History:  Social History:  Social History   Substance and Sexual Activity  Alcohol Use Yes  . Alcohol/week: 6.0 standard drinks  . Types: 6 Cans of beer per week   Comment: beer occas     Social History   Substance and Sexual Activity  Drug Use No  . Frequency: 2.0 times per week  . Types: Marijuana   Comment: smokes daily for thirty years. currently takes one puff    Social History   Socioeconomic History  . Marital status: Divorced    Spouse name: Not on file  . Number of children: Not on file  . Years of education: Not on file  . Highest education level: Not on  file  Occupational History  . Not on file  Tobacco Use  . Smoking status: Never Smoker  . Smokeless tobacco: Never Used  Vaping Use  . Vaping Use: Never used  Substance and Sexual Activity  . Alcohol use: Yes    Alcohol/week: 6.0 standard drinks    Types: 6 Cans of beer per week    Comment: beer occas  . Drug use: No    Frequency: 2.0 times per week    Types: Marijuana    Comment: smokes daily for thirty years. currently takes one puff  . Sexual activity: Yes    Partners: Female    Birth control/protection: Condom  Other Topics Concern  . Not on file  Social History Narrative  . Not on file   Social Determinants of Health   Financial Resource Strain:   . Difficulty of Paying Living Expenses:   Food Insecurity:   . Worried About Charity fundraiser in the Last Year:   . Arboriculturist in the Last Year:   Transportation  Needs:   . Film/video editor (Medical):   Marland Kitchen Lack of Transportation (Non-Medical):   Physical Activity:   . Days of Exercise per Week:   . Minutes of Exercise per Session:   Stress:   . Feeling of Stress :   Social Connections:   . Frequency of Communication with Friends and Family:   . Frequency of Social Gatherings with Friends and Family:   . Attends Religious Services:   . Active Member of Clubs or Organizations:   . Attends Archivist Meetings:   Marland Kitchen Marital Status:    Additional Social History:                         Sleep: Fair  Appetite:  Good  Current Medications: Current Outpatient Medications  Medication Sig Dispense Refill  . acetaminophen (TYLENOL) 500 MG tablet Take 1,000 mg by mouth every 6 (six) hours as needed for mild pain.    . Ca Carbonate-Mag Hydroxide (ROLAIDS PO) Take 2 tablets by mouth daily as needed (heartburn).    . Carboxymethylcellul-Glycerin (LUBRICATING EYE DROPS OP) Place 1 drop into both eyes daily as needed (dry eyes).    . diphenhydrAMINE (BENADRYL) 25 mg capsule Take 25 mg by mouth at bedtime as needed for allergies or sleep.     Marland Kitchen doxepin (SINEQUAN) 25 MG capsule Take 1 capsule (25 mg total) by mouth at bedtime. 30 capsule 2  . hydrocortisone cream 1 % Apply 1 application topically daily as needed for itching.    Marland Kitchen ibuprofen (ADVIL) 200 MG tablet Take 600-800 mg by mouth every 6 (six) hours as needed for headache or moderate pain.    Marland Kitchen levothyroxine (SYNTHROID, LEVOTHROID) 50 MCG tablet Take 50 mcg by mouth daily before breakfast.     . LORazepam (ATIVAN) 1 MG tablet Take 1 tablet (1 mg total) by mouth 2 (two) times daily. 70 tablet 4  . metoprolol succinate (TOPROL XL) 25 MG 24 hr tablet Take 1 tablet (25 mg total) by mouth daily. 30 tablet 3  . oxymetazoline (AFRIN) 0.05 % nasal spray Place 1-2 sprays into both nostrils 2 (two) times daily as needed for congestion.    . traMADol (ULTRAM) 50 MG tablet     .  valACYclovir (VALTREX) 500 MG tablet Take 500 mg by mouth daily as needed (Herpes Virus).    . zolpidem (AMBIEN) 10 MG tablet Take 1  tablet (10 mg total) by mouth at bedtime. 30 tablet 5   No current facility-administered medications for this visit.    Lab Results: No results found for this or any previous visit (from the past 48 hour(s)).  Blood Alcohol level:  No results found for: [facility]  Physical Findings: AIMS:  , ,  ,  ,    CIWA:    COWS:     Musculoskeletal: Strength & Muscle Tone: within normal limits Gait & Station: normal Patient leans: N/A  Psychiatric Specialty Exam: ROS  There were no vitals taken for this visit.There is no height or weight on file to calculate BMI.  General Appearance: Casual  Eye Contact::  Good  Speech:  Clear and Coherent  Volume:  Normal  Mood:mild depression  Affect:  Congruent  Thought Process:  Coherent  Orientation:  Full (Time, Place, and Person)  Thought Content:  WDL  Suicidal Thoughts:  No  Homicidal Thoughts:  No  Memory:  NA  Judgement:  Good  Insight:  Fair  Psychomotor Activity:  Normal  Concentration:  Good  Recall:  Good  Fund of Knowledge:Good  Language: Fair  Akathisia:  No  Handed:  Right  AIMS (if indicated):     Assets:  Desire for Improvement  ADL's:  Intact  Cognition: WNL  Sleep:      Treatment Plan Summary: 09/14/2019,  \ Patient is to problems.  Versus dysthymic disorder.  The possibility of reviewing his past antidepressants and to try again is not out of the question.  Nonetheless it is important for him to make an attempt to get back into therapy.  His second problem is insomnia.  He takes Ambien and then with addition of doxepin sleeps actually quite well.  This patient she will return to see me in 3 months.  The patient is functioning only fairly well.

## 2019-09-26 DIAGNOSIS — M25552 Pain in left hip: Secondary | ICD-10-CM | POA: Diagnosis not present

## 2019-09-26 DIAGNOSIS — M545 Low back pain: Secondary | ICD-10-CM | POA: Diagnosis not present

## 2019-09-26 DIAGNOSIS — R262 Difficulty in walking, not elsewhere classified: Secondary | ICD-10-CM | POA: Diagnosis not present

## 2019-09-26 DIAGNOSIS — Z859 Personal history of malignant neoplasm, unspecified: Secondary | ICD-10-CM | POA: Diagnosis not present

## 2019-09-26 DIAGNOSIS — I10 Essential (primary) hypertension: Secondary | ICD-10-CM | POA: Diagnosis not present

## 2019-09-26 DIAGNOSIS — M25551 Pain in right hip: Secondary | ICD-10-CM | POA: Diagnosis not present

## 2019-09-26 DIAGNOSIS — R531 Weakness: Secondary | ICD-10-CM | POA: Diagnosis not present

## 2019-09-26 DIAGNOSIS — M2569 Stiffness of other specified joint, not elsewhere classified: Secondary | ICD-10-CM | POA: Diagnosis not present

## 2019-09-27 DIAGNOSIS — I10 Essential (primary) hypertension: Secondary | ICD-10-CM | POA: Diagnosis not present

## 2019-09-28 DIAGNOSIS — M25551 Pain in right hip: Secondary | ICD-10-CM | POA: Diagnosis not present

## 2019-09-28 DIAGNOSIS — M25552 Pain in left hip: Secondary | ICD-10-CM | POA: Diagnosis not present

## 2019-09-28 DIAGNOSIS — R262 Difficulty in walking, not elsewhere classified: Secondary | ICD-10-CM | POA: Diagnosis not present

## 2019-09-28 DIAGNOSIS — I10 Essential (primary) hypertension: Secondary | ICD-10-CM | POA: Diagnosis not present

## 2019-09-28 DIAGNOSIS — Z859 Personal history of malignant neoplasm, unspecified: Secondary | ICD-10-CM | POA: Diagnosis not present

## 2019-09-28 DIAGNOSIS — M2569 Stiffness of other specified joint, not elsewhere classified: Secondary | ICD-10-CM | POA: Diagnosis not present

## 2019-09-28 DIAGNOSIS — R531 Weakness: Secondary | ICD-10-CM | POA: Diagnosis not present

## 2019-09-28 DIAGNOSIS — M545 Low back pain: Secondary | ICD-10-CM | POA: Diagnosis not present

## 2019-10-04 DIAGNOSIS — M25552 Pain in left hip: Secondary | ICD-10-CM | POA: Diagnosis not present

## 2019-10-04 DIAGNOSIS — R262 Difficulty in walking, not elsewhere classified: Secondary | ICD-10-CM | POA: Diagnosis not present

## 2019-10-04 DIAGNOSIS — M2569 Stiffness of other specified joint, not elsewhere classified: Secondary | ICD-10-CM | POA: Diagnosis not present

## 2019-10-04 DIAGNOSIS — M25551 Pain in right hip: Secondary | ICD-10-CM | POA: Diagnosis not present

## 2019-10-04 DIAGNOSIS — Z859 Personal history of malignant neoplasm, unspecified: Secondary | ICD-10-CM | POA: Diagnosis not present

## 2019-10-04 DIAGNOSIS — R531 Weakness: Secondary | ICD-10-CM | POA: Diagnosis not present

## 2019-10-04 DIAGNOSIS — M545 Low back pain: Secondary | ICD-10-CM | POA: Diagnosis not present

## 2019-10-04 DIAGNOSIS — I10 Essential (primary) hypertension: Secondary | ICD-10-CM | POA: Diagnosis not present

## 2019-10-05 DIAGNOSIS — M25552 Pain in left hip: Secondary | ICD-10-CM | POA: Diagnosis not present

## 2019-10-05 DIAGNOSIS — I10 Essential (primary) hypertension: Secondary | ICD-10-CM | POA: Diagnosis not present

## 2019-10-05 DIAGNOSIS — M25551 Pain in right hip: Secondary | ICD-10-CM | POA: Diagnosis not present

## 2019-10-05 DIAGNOSIS — R262 Difficulty in walking, not elsewhere classified: Secondary | ICD-10-CM | POA: Diagnosis not present

## 2019-10-05 DIAGNOSIS — R531 Weakness: Secondary | ICD-10-CM | POA: Diagnosis not present

## 2019-10-05 DIAGNOSIS — M2569 Stiffness of other specified joint, not elsewhere classified: Secondary | ICD-10-CM | POA: Diagnosis not present

## 2019-10-05 DIAGNOSIS — Z859 Personal history of malignant neoplasm, unspecified: Secondary | ICD-10-CM | POA: Diagnosis not present

## 2019-10-05 DIAGNOSIS — M545 Low back pain: Secondary | ICD-10-CM | POA: Diagnosis not present

## 2019-10-07 DIAGNOSIS — J329 Chronic sinusitis, unspecified: Secondary | ICD-10-CM | POA: Diagnosis not present

## 2019-10-07 DIAGNOSIS — Z299 Encounter for prophylactic measures, unspecified: Secondary | ICD-10-CM | POA: Diagnosis not present

## 2019-10-07 DIAGNOSIS — I711 Thoracic aortic aneurysm, ruptured: Secondary | ICD-10-CM | POA: Diagnosis not present

## 2019-10-07 DIAGNOSIS — I10 Essential (primary) hypertension: Secondary | ICD-10-CM | POA: Diagnosis not present

## 2019-10-07 DIAGNOSIS — C8590 Non-Hodgkin lymphoma, unspecified, unspecified site: Secondary | ICD-10-CM | POA: Diagnosis not present

## 2019-10-10 DIAGNOSIS — Z859 Personal history of malignant neoplasm, unspecified: Secondary | ICD-10-CM | POA: Diagnosis not present

## 2019-10-10 DIAGNOSIS — M2569 Stiffness of other specified joint, not elsewhere classified: Secondary | ICD-10-CM | POA: Diagnosis not present

## 2019-10-10 DIAGNOSIS — I10 Essential (primary) hypertension: Secondary | ICD-10-CM | POA: Diagnosis not present

## 2019-10-10 DIAGNOSIS — M545 Low back pain: Secondary | ICD-10-CM | POA: Diagnosis not present

## 2019-10-10 DIAGNOSIS — R531 Weakness: Secondary | ICD-10-CM | POA: Diagnosis not present

## 2019-10-10 DIAGNOSIS — R262 Difficulty in walking, not elsewhere classified: Secondary | ICD-10-CM | POA: Diagnosis not present

## 2019-10-10 DIAGNOSIS — M25551 Pain in right hip: Secondary | ICD-10-CM | POA: Diagnosis not present

## 2019-10-10 DIAGNOSIS — M25552 Pain in left hip: Secondary | ICD-10-CM | POA: Diagnosis not present

## 2019-10-12 DIAGNOSIS — M545 Low back pain: Secondary | ICD-10-CM | POA: Diagnosis not present

## 2019-10-12 DIAGNOSIS — M25551 Pain in right hip: Secondary | ICD-10-CM | POA: Diagnosis not present

## 2019-10-12 DIAGNOSIS — M25552 Pain in left hip: Secondary | ICD-10-CM | POA: Diagnosis not present

## 2019-10-12 DIAGNOSIS — Z859 Personal history of malignant neoplasm, unspecified: Secondary | ICD-10-CM | POA: Diagnosis not present

## 2019-10-12 DIAGNOSIS — R531 Weakness: Secondary | ICD-10-CM | POA: Diagnosis not present

## 2019-10-12 DIAGNOSIS — M2569 Stiffness of other specified joint, not elsewhere classified: Secondary | ICD-10-CM | POA: Diagnosis not present

## 2019-10-12 DIAGNOSIS — I10 Essential (primary) hypertension: Secondary | ICD-10-CM | POA: Diagnosis not present

## 2019-10-12 DIAGNOSIS — R262 Difficulty in walking, not elsewhere classified: Secondary | ICD-10-CM | POA: Diagnosis not present

## 2019-10-14 DIAGNOSIS — R262 Difficulty in walking, not elsewhere classified: Secondary | ICD-10-CM | POA: Diagnosis not present

## 2019-10-14 DIAGNOSIS — I10 Essential (primary) hypertension: Secondary | ICD-10-CM | POA: Diagnosis not present

## 2019-10-14 DIAGNOSIS — Z859 Personal history of malignant neoplasm, unspecified: Secondary | ICD-10-CM | POA: Diagnosis not present

## 2019-10-14 DIAGNOSIS — M25552 Pain in left hip: Secondary | ICD-10-CM | POA: Diagnosis not present

## 2019-10-14 DIAGNOSIS — M2569 Stiffness of other specified joint, not elsewhere classified: Secondary | ICD-10-CM | POA: Diagnosis not present

## 2019-10-14 DIAGNOSIS — M545 Low back pain: Secondary | ICD-10-CM | POA: Diagnosis not present

## 2019-10-14 DIAGNOSIS — R531 Weakness: Secondary | ICD-10-CM | POA: Diagnosis not present

## 2019-10-14 DIAGNOSIS — M25551 Pain in right hip: Secondary | ICD-10-CM | POA: Diagnosis not present

## 2019-10-17 DIAGNOSIS — R531 Weakness: Secondary | ICD-10-CM | POA: Diagnosis not present

## 2019-10-17 DIAGNOSIS — M2569 Stiffness of other specified joint, not elsewhere classified: Secondary | ICD-10-CM | POA: Diagnosis not present

## 2019-10-17 DIAGNOSIS — M25551 Pain in right hip: Secondary | ICD-10-CM | POA: Diagnosis not present

## 2019-10-17 DIAGNOSIS — R262 Difficulty in walking, not elsewhere classified: Secondary | ICD-10-CM | POA: Diagnosis not present

## 2019-10-17 DIAGNOSIS — M25552 Pain in left hip: Secondary | ICD-10-CM | POA: Diagnosis not present

## 2019-10-17 DIAGNOSIS — M545 Low back pain: Secondary | ICD-10-CM | POA: Diagnosis not present

## 2019-10-17 DIAGNOSIS — I10 Essential (primary) hypertension: Secondary | ICD-10-CM | POA: Diagnosis not present

## 2019-10-17 DIAGNOSIS — Z859 Personal history of malignant neoplasm, unspecified: Secondary | ICD-10-CM | POA: Diagnosis not present

## 2019-10-19 ENCOUNTER — Inpatient Hospital Stay (HOSPITAL_COMMUNITY): Payer: Medicare Other

## 2019-10-19 DIAGNOSIS — M25552 Pain in left hip: Secondary | ICD-10-CM | POA: Diagnosis not present

## 2019-10-19 DIAGNOSIS — R531 Weakness: Secondary | ICD-10-CM | POA: Diagnosis not present

## 2019-10-19 DIAGNOSIS — R262 Difficulty in walking, not elsewhere classified: Secondary | ICD-10-CM | POA: Diagnosis not present

## 2019-10-19 DIAGNOSIS — M25551 Pain in right hip: Secondary | ICD-10-CM | POA: Diagnosis not present

## 2019-10-19 DIAGNOSIS — M545 Low back pain: Secondary | ICD-10-CM | POA: Diagnosis not present

## 2019-10-19 DIAGNOSIS — Z859 Personal history of malignant neoplasm, unspecified: Secondary | ICD-10-CM | POA: Diagnosis not present

## 2019-10-19 DIAGNOSIS — M2569 Stiffness of other specified joint, not elsewhere classified: Secondary | ICD-10-CM | POA: Diagnosis not present

## 2019-10-19 DIAGNOSIS — I10 Essential (primary) hypertension: Secondary | ICD-10-CM | POA: Diagnosis not present

## 2019-10-24 DIAGNOSIS — Z859 Personal history of malignant neoplasm, unspecified: Secondary | ICD-10-CM | POA: Diagnosis not present

## 2019-10-24 DIAGNOSIS — I10 Essential (primary) hypertension: Secondary | ICD-10-CM | POA: Diagnosis not present

## 2019-10-24 DIAGNOSIS — R262 Difficulty in walking, not elsewhere classified: Secondary | ICD-10-CM | POA: Diagnosis not present

## 2019-10-24 DIAGNOSIS — R531 Weakness: Secondary | ICD-10-CM | POA: Diagnosis not present

## 2019-10-24 DIAGNOSIS — M25552 Pain in left hip: Secondary | ICD-10-CM | POA: Diagnosis not present

## 2019-10-24 DIAGNOSIS — M25551 Pain in right hip: Secondary | ICD-10-CM | POA: Diagnosis not present

## 2019-10-24 DIAGNOSIS — M545 Low back pain: Secondary | ICD-10-CM | POA: Diagnosis not present

## 2019-10-24 DIAGNOSIS — M2569 Stiffness of other specified joint, not elsewhere classified: Secondary | ICD-10-CM | POA: Diagnosis not present

## 2019-10-26 ENCOUNTER — Ambulatory Visit (HOSPITAL_COMMUNITY): Payer: Medicare Other | Admitting: Hematology

## 2019-10-26 DIAGNOSIS — Z859 Personal history of malignant neoplasm, unspecified: Secondary | ICD-10-CM | POA: Diagnosis not present

## 2019-10-26 DIAGNOSIS — I10 Essential (primary) hypertension: Secondary | ICD-10-CM | POA: Diagnosis not present

## 2019-10-26 DIAGNOSIS — M25552 Pain in left hip: Secondary | ICD-10-CM | POA: Diagnosis not present

## 2019-10-26 DIAGNOSIS — R262 Difficulty in walking, not elsewhere classified: Secondary | ICD-10-CM | POA: Diagnosis not present

## 2019-10-26 DIAGNOSIS — M545 Low back pain: Secondary | ICD-10-CM | POA: Diagnosis not present

## 2019-10-26 DIAGNOSIS — R531 Weakness: Secondary | ICD-10-CM | POA: Diagnosis not present

## 2019-10-26 DIAGNOSIS — M25551 Pain in right hip: Secondary | ICD-10-CM | POA: Diagnosis not present

## 2019-10-26 DIAGNOSIS — M2569 Stiffness of other specified joint, not elsewhere classified: Secondary | ICD-10-CM | POA: Diagnosis not present

## 2019-10-27 DIAGNOSIS — M4316 Spondylolisthesis, lumbar region: Secondary | ICD-10-CM | POA: Diagnosis not present

## 2019-10-27 DIAGNOSIS — I10 Essential (primary) hypertension: Secondary | ICD-10-CM | POA: Diagnosis not present

## 2019-10-27 DIAGNOSIS — M48062 Spinal stenosis, lumbar region with neurogenic claudication: Secondary | ICD-10-CM | POA: Diagnosis not present

## 2019-10-31 DIAGNOSIS — Z859 Personal history of malignant neoplasm, unspecified: Secondary | ICD-10-CM | POA: Diagnosis not present

## 2019-10-31 DIAGNOSIS — M25552 Pain in left hip: Secondary | ICD-10-CM | POA: Diagnosis not present

## 2019-10-31 DIAGNOSIS — R262 Difficulty in walking, not elsewhere classified: Secondary | ICD-10-CM | POA: Diagnosis not present

## 2019-10-31 DIAGNOSIS — M48062 Spinal stenosis, lumbar region with neurogenic claudication: Secondary | ICD-10-CM | POA: Diagnosis not present

## 2019-10-31 DIAGNOSIS — R531 Weakness: Secondary | ICD-10-CM | POA: Diagnosis not present

## 2019-10-31 DIAGNOSIS — M4316 Spondylolisthesis, lumbar region: Secondary | ICD-10-CM | POA: Diagnosis not present

## 2019-10-31 DIAGNOSIS — M25551 Pain in right hip: Secondary | ICD-10-CM | POA: Diagnosis not present

## 2019-10-31 DIAGNOSIS — I10 Essential (primary) hypertension: Secondary | ICD-10-CM | POA: Diagnosis not present

## 2019-10-31 DIAGNOSIS — M545 Low back pain, unspecified: Secondary | ICD-10-CM | POA: Diagnosis not present

## 2019-10-31 DIAGNOSIS — M2569 Stiffness of other specified joint, not elsewhere classified: Secondary | ICD-10-CM | POA: Diagnosis not present

## 2019-11-02 ENCOUNTER — Other Ambulatory Visit (HOSPITAL_COMMUNITY): Payer: Self-pay

## 2019-11-02 DIAGNOSIS — C8330 Diffuse large B-cell lymphoma, unspecified site: Secondary | ICD-10-CM

## 2019-11-03 ENCOUNTER — Other Ambulatory Visit: Payer: Self-pay

## 2019-11-03 ENCOUNTER — Inpatient Hospital Stay (HOSPITAL_COMMUNITY): Payer: Medicare Other | Attending: Hematology

## 2019-11-03 DIAGNOSIS — R61 Generalized hyperhidrosis: Secondary | ICD-10-CM | POA: Diagnosis not present

## 2019-11-03 DIAGNOSIS — Z23 Encounter for immunization: Secondary | ICD-10-CM | POA: Insufficient documentation

## 2019-11-03 DIAGNOSIS — C8330 Diffuse large B-cell lymphoma, unspecified site: Secondary | ICD-10-CM | POA: Insufficient documentation

## 2019-11-03 LAB — CBC WITH DIFFERENTIAL/PLATELET
Abs Immature Granulocytes: 0.41 10*3/uL — ABNORMAL HIGH (ref 0.00–0.07)
Basophils Absolute: 0.1 10*3/uL (ref 0.0–0.1)
Basophils Relative: 1 %
Eosinophils Absolute: 0.1 10*3/uL (ref 0.0–0.5)
Eosinophils Relative: 1 %
HCT: 41.9 % (ref 39.0–52.0)
Hemoglobin: 15 g/dL (ref 13.0–17.0)
Immature Granulocytes: 5 %
Lymphocytes Relative: 30 %
Lymphs Abs: 2.7 10*3/uL (ref 0.7–4.0)
MCH: 33.9 pg (ref 26.0–34.0)
MCHC: 35.8 g/dL (ref 30.0–36.0)
MCV: 94.6 fL (ref 80.0–100.0)
Monocytes Absolute: 0.8 10*3/uL (ref 0.1–1.0)
Monocytes Relative: 9 %
Neutro Abs: 4.9 10*3/uL (ref 1.7–7.7)
Neutrophils Relative %: 54 %
Platelets: 262 10*3/uL (ref 150–400)
RBC: 4.43 MIL/uL (ref 4.22–5.81)
RDW: 13.2 % (ref 11.5–15.5)
WBC: 9.1 10*3/uL (ref 4.0–10.5)
nRBC: 0 % (ref 0.0–0.2)

## 2019-11-03 LAB — COMPREHENSIVE METABOLIC PANEL
ALT: 27 U/L (ref 0–44)
AST: 21 U/L (ref 15–41)
Albumin: 4.5 g/dL (ref 3.5–5.0)
Alkaline Phosphatase: 34 U/L — ABNORMAL LOW (ref 38–126)
Anion gap: 10 (ref 5–15)
BUN: 22 mg/dL (ref 8–23)
CO2: 23 mmol/L (ref 22–32)
Calcium: 9.2 mg/dL (ref 8.9–10.3)
Chloride: 100 mmol/L (ref 98–111)
Creatinine, Ser: 0.75 mg/dL (ref 0.61–1.24)
GFR calc non Af Amer: >60 mL/min (ref >60)
Glucose, Bld: 103 mg/dL — ABNORMAL HIGH (ref 70–99)
Potassium: 4.1 mmol/L (ref 3.5–5.1)
Sodium: 133 mmol/L — ABNORMAL LOW (ref 135–145)
Total Bilirubin: 1.2 mg/dL (ref 0.3–1.2)
Total Protein: 7.3 g/dL (ref 6.5–8.1)

## 2019-11-03 LAB — LACTATE DEHYDROGENASE: LDH: 174 U/L (ref 98–192)

## 2019-11-07 ENCOUNTER — Other Ambulatory Visit: Payer: Self-pay

## 2019-11-07 ENCOUNTER — Inpatient Hospital Stay (HOSPITAL_BASED_OUTPATIENT_CLINIC_OR_DEPARTMENT_OTHER): Payer: Medicare Other | Admitting: Hematology

## 2019-11-07 VITALS — BP 133/77 | HR 76 | Temp 96.6°F | Resp 18 | Wt 188.1 lb

## 2019-11-07 DIAGNOSIS — C8330 Diffuse large B-cell lymphoma, unspecified site: Secondary | ICD-10-CM | POA: Diagnosis not present

## 2019-11-07 DIAGNOSIS — Z23 Encounter for immunization: Secondary | ICD-10-CM | POA: Diagnosis not present

## 2019-11-07 DIAGNOSIS — R61 Generalized hyperhidrosis: Secondary | ICD-10-CM | POA: Diagnosis not present

## 2019-11-07 MED ORDER — INFLUENZA VAC A&B SA ADJ QUAD 0.5 ML IM PRSY
0.5000 mL | PREFILLED_SYRINGE | Freq: Once | INTRAMUSCULAR | Status: AC
  Administered 2019-11-07: 0.5 mL via INTRAMUSCULAR
  Filled 2019-11-07: qty 0.5

## 2019-11-07 NOTE — Patient Instructions (Addendum)
[facility] at [facility] Discharge Instructions  You were seen today by Dr. Delton Coombes. He went over your recent results. If you have a worsening in your symptoms or you continue to lose weight, please call the office immediately. Dr. Delton Coombes will see you back in 1 year for labs and follow up.   Thank you for choosing [facility] at [facility] to provide your oncology and hematology care.  To afford each patient quality time with our provider, please arrive at least 15 minutes before your scheduled appointment time.   If you have a lab appointment with the [facility] please come in thru the Main Entrance and check in at the main information desk  You need to re-schedule your appointment should you arrive 10 or more minutes late.  We strive to give you quality time with our providers, and arriving late affects you and other patients whose appointments are after yours.  Also, if you no show three or more times for appointments you may be dismissed from the clinic at the providers discretion.     Again, thank you for choosing [facility].  Our hope is that these requests will decrease the amount of time that you wait before being seen by our physicians.       _____________________________________________________________  Should you have questions after your visit to [facility], please contact our office at [phone] between the hours of 8:00 a.m. and 4:30 p.m.  Voicemails left after 4:00 p.m. will not be returned until the following business day.  For prescription refill requests, have your pharmacy contact our office and allow 72 hours.    Cancer Center Support Programs:   > Cancer Support Group  2nd Tuesday of the month 1pm-2pm, Journey Room

## 2019-11-07 NOTE — Progress Notes (Signed)
[place], [place] [address]   CLINIC:  Medical Oncology/Hematology  PCP:  [name], [place] [address]  [phone]  REASON FOR VISIT:  Follow-up for DLBCL  PRIOR THERAPY: Chemotherapy with 6 cycles at [facility] completed on 10/27/2013  CURRENT THERAPY: Observation  INTERVAL HISTORY:  Douglas Bryant, a 69 y.o. male, returns for routine follow-up for his DLBCL. Lowen was last seen on 10/20/2018.  Today he reports feeling good. He reports having night sweats every night and requires changing his shirt; this is a chronic state for him. He reports that his appetite is good.   REVIEW OF SYSTEMS:  Review of Systems  Constitutional: Positive for diaphoresis (drenching night sweats) and fatigue (50%). Negative for appetite change.  Genitourinary: Positive for nocturia.   Psychiatric/Behavioral: Positive for sleep disturbance.  All other systems reviewed and are negative.   PAST MEDICAL/SURGICAL HISTORY:  Past Medical History:  Diagnosis Date   Anemia    Aneurysm, aorta, thoracic (HCC)    Anxiety    Back pain    Depression    DLBCL (diffuse large B cell lymphoma) ([place]) 07/17/2015   Environmental allergies    History of blood transfusion    History of bronchitis    History of chemotherapy    Hypertension    Hypothyroidism    Nocturia    Peripheral vascular disease (HCC)    Urinary frequency    Past Surgical History:  Procedure Laterality Date   BONE MARROW BIOPSY     COLONOSCOPY N/A 08/12/2018   Procedure: COLONOSCOPY;  Surgeon: [name], MD;  [facility];  Service: Endoscopy;  Laterality: N/A;  1030   HERNIA REPAIR     umblical times 2; inguinal (left)    IR REMOVAL TUN ACCESS W/ PORT W/O FL MOD SED  07/08/2016   POLYPECTOMY  08/12/2018   Procedure: POLYPECTOMY;  Surgeon: [name], MD;  [facility];  Service: Endoscopy;;  colon   port a  cath placement     RHINOPLASTY      SOCIAL HISTORY:  Social History   Socioeconomic History   Marital status: Divorced    Spouse name: Not on file   Number of children: Not on file   Years of education: Not on file   Highest education level: Not on file  Occupational History   Not on file  Tobacco Use   Smoking status: Never Smoker   Smokeless tobacco: Never Used  Vaping Use   Vaping Use: Never used  Substance and Sexual Activity   Alcohol use: Yes    Alcohol/week: 6.0 standard drinks    Types: 6 Cans of beer per week    Comment: beer occas   Drug use: No    Frequency: 2.0 times per week    Types: Marijuana    Comment: smokes daily for thirty years. currently takes one puff   Sexual activity: Yes    Partners: Female    Birth control/protection: Condom  Other Topics Concern   Not on file  Social History Narrative   Not on file   Social Determinants of Health   Financial Resource Strain:    Difficulty of Paying Living Expenses: Not on file  Food Insecurity:    Worried About Charity fundraiser in the Last Year: Not on file   [employer] of Food in the Last Year: Not on Pensions consultant  Needs:    Lack of Transportation (Medical): Not on file   Lack of Transportation (Non-Medical): Not on file  Physical Activity:    Days of Exercise per Week: Not on file   Minutes of Exercise per Session: Not on file  Stress:    Feeling of Stress : Not on file  Social Connections:    Frequency of Communication with Friends and Family: Not on file   Frequency of Social Gatherings with Friends and Family: Not on file   Attends Religious Services: Not on file   Active Member of Clubs or Organizations: Not on file   Attends Archivist Meetings: Not on file   Marital Status: Not on file  Intimate Partner Violence:    Fear of Current or Ex-Partner: Not on file   Emotionally Abused: Not on file   Physically Abused: Not on file   Sexually  Abused: Not on file    FAMILY HISTORY:  Family History  Problem Relation Age of Onset   Alcohol abuse Father    Depression Sister    Anxiety disorder Sister    Depression Sister    Anxiety disorder Sister     CURRENT MEDICATIONS:  Current Outpatient Medications  Medication Sig Dispense Refill   acetaminophen (TYLENOL) 500 MG tablet Take 1,000 mg by mouth every 6 (six) hours as needed for mild pain.     Ca Carbonate-Mag Hydroxide (ROLAIDS PO) Take 2 tablets by mouth daily as needed (heartburn).     Carboxymethylcellul-Glycerin (LUBRICATING EYE DROPS OP) Place 1 drop into both eyes daily as needed (dry eyes).     diphenhydrAMINE (BENADRYL) 25 mg capsule Take 25 mg by mouth at bedtime as needed for allergies or sleep.      doxepin (SINEQUAN) 25 MG capsule Take 1 capsule (25 mg total) by mouth at bedtime. 30 capsule 2   hydrocortisone cream 1 % Apply 1 application topically daily as needed for itching.     ibuprofen (ADVIL) 200 MG tablet Take 600-800 mg by mouth every 6 (six) hours as needed for headache or moderate pain.     levothyroxine (SYNTHROID, LEVOTHROID) 50 MCG tablet Take 50 mcg by mouth daily before breakfast.      LORazepam (ATIVAN) 1 MG tablet Take 1 tablet (1 mg total) by mouth 2 (two) times daily. 70 tablet 4   metoprolol succinate (TOPROL XL) 25 MG 24 hr tablet Take 1 tablet (25 mg total) by mouth daily. 30 tablet 3   oxymetazoline (AFRIN) 0.05 % nasal spray Place 1-2 sprays into both nostrils 2 (two) times daily as needed for congestion.     traMADol (ULTRAM) 50 MG tablet      valACYclovir (VALTREX) 500 MG tablet Take 500 mg by mouth daily as needed (Herpes Virus).     zolpidem (AMBIEN) 10 MG tablet Take 1 tablet (10 mg total) by mouth at bedtime. 30 tablet 5   celecoxib (CELEBREX) 200 MG capsule Take 200 mg by mouth daily. (Patient not taking: Reported on 11/07/2019)     Current Facility-Administered Medications  Medication Dose Route Frequency  Provider Last Rate Last Admin   influenza vaccine adjuvanted (FLUAD) injection 0.5 mL  0.5 mL Intramuscular Once Derek Jack, MD        ALLERGIES:  Allergies  Allergen Reactions   Flomax [Tamsulosin Hcl] Swelling    congestion     PHYSICAL EXAM:  Performance status (ECOG): 1 - Symptomatic but completely ambulatory  Vitals:   11/07/19 1437  BP: 133/77  Pulse: 76  Resp: 18  Temp: (!) 96.6 F (35.9 C)  SpO2: 97%   Wt Readings from Last 3 Encounters:  11/07/19 188 lb 1.6 oz (85.3 kg)  07/12/19 193 lb 6.4 oz (87.7 kg)  10/20/18 190 lb 11.2 oz (86.5 kg)   Physical Exam Vitals reviewed.  Constitutional:      Appearance: Normal appearance.  Cardiovascular:     Rate and Rhythm: Normal rate and regular rhythm.     Pulses: Normal pulses.     Heart sounds: Normal heart sounds.  Pulmonary:     Effort: Pulmonary effort is normal.     Breath sounds: Normal breath sounds.  Abdominal:     Palpations: Abdomen is soft. There is no hepatomegaly, splenomegaly or mass.     Tenderness: There is no abdominal tenderness.     Hernia: No hernia is present.  Lymphadenopathy:     Cervical: No cervical adenopathy.     Upper Body:     Right upper body: No supraclavicular, axillary or pectoral adenopathy.     Left upper body: No supraclavicular, axillary or pectoral adenopathy.     Lower Body: No right inguinal adenopathy. No left inguinal adenopathy.  Neurological:     General: No focal deficit present.     Mental Status: He is alert and oriented to person, place, and time.  Psychiatric:        Mood and Affect: Mood normal.        Behavior: Behavior normal.     LABORATORY DATA:  I have reviewed the labs as listed.  CBC Latest Ref Rng & Units 11/03/2019 10/13/2018 10/05/2017  WBC 4.0 - 10.5 K/uL 9.1 5.6 5.7  Hemoglobin 13.0 - 17.0 g/dL 15.0 14.7 14.2  Hematocrit 39 - 52 % 41.9 41.8 40.8  Platelets 150 - 400 K/uL 262 196 153   CMP Latest Ref Rng & Units 11/03/2019 10/13/2018  10/05/2017  Glucose 70 - 99 mg/dL 103(H) 104(H) 85  BUN 8 - 23 mg/dL _0 Creatinine 0.61 - 1.24 mg/dL 0.75 0.81 0.90  Sodium 135 - 145 mmol/L 133(L) 138 137  Potassium 3.5 - 5.1 mmol/L 4.1 4.0 3.8  Chloride 98 - 111 mmol/L 100 106 105  CO2 22 - 32 mmol/L _1 Calcium 8.9 - 10.3 mg/dL 9.2 8.9 8.8(L)  Total Protein 6.5 - 8.1 g/dL 7.3 7.0 7.1  Total Bilirubin 0.3 - 1.2 mg/dL 1.2 1.2 1.3(H)  Alkaline Phos 38 - 126 U/L 34(L) 36(L) 30(L)  AST 15 - 41 U/L _2 ALT 0 - 44 U/L _3 Component Value Date/Time   RBC 4.43 11/03/2019 1246   MCV 94.6 11/03/2019 1246   MCH 33.9 11/03/2019 1246   MCHC 35.8 11/03/2019 1246   RDW 13.2 11/03/2019 1246   LYMPHSABS 2.7 11/03/2019 1246   MONOABS 0.8 11/03/2019 1246   EOSABS 0.1 11/03/2019 1246   BASOSABS 0.1 11/03/2019 1246   Lab Results  Component Value Date   LDH 174 11/03/2019   LDH 185 10/13/2018   LDH 197 (H) 10/05/2017    DIAGNOSTIC IMAGING:  I have independently reviewed the scans and discussed with the patient. No results found.   ASSESSMENT:  1.  Stage IVB DLBCL: - Presentation with night sweats, 40 pound weight loss, nausea. -Treated with 6 cycles of chemotherapy at [facility], completed on 10/27/2013.  PLAN:  1.  Stage IVB DLBCL: -He has mild night sweats and lost  5 pounds through the summer. -Physical exam today did not reveal any palpable adenopathy or splenomegaly. -Reviewed labs from 11/03/2019.  LDH was 174 normal.  CBC was normal.  Comprehensive metabolic panel shows normal LFTs and renal function. -RTC 1 year with repeat labs.  Scans only if clinical condition dictates.   Orders placed this encounter:  No orders of the defined types were placed in this encounter.    Derek Jack, MD [place] [phone]   I, Milinda Antis, am acting as a scribe for Dr. Sanda Linger.  I, Derek Jack MD, have reviewed the above documentation for accuracy and  completeness, and I agree with the above.

## 2019-11-07 NOTE — Progress Notes (Signed)
Douglas Bryant presents today for high dose flu vaccine injection per the provider's orders. Left arm administration without incident; injection site WNL; see MAR for injection details.  Patient tolerated procedure well and without incident.  No questions or complaints noted at this time.

## 2019-11-16 DIAGNOSIS — R262 Difficulty in walking, not elsewhere classified: Secondary | ICD-10-CM | POA: Diagnosis not present

## 2019-11-16 DIAGNOSIS — M25551 Pain in right hip: Secondary | ICD-10-CM | POA: Diagnosis not present

## 2019-11-16 DIAGNOSIS — M25552 Pain in left hip: Secondary | ICD-10-CM | POA: Diagnosis not present

## 2019-11-16 DIAGNOSIS — R531 Weakness: Secondary | ICD-10-CM | POA: Diagnosis not present

## 2019-11-16 DIAGNOSIS — I10 Essential (primary) hypertension: Secondary | ICD-10-CM | POA: Diagnosis not present

## 2019-11-16 DIAGNOSIS — M545 Low back pain, unspecified: Secondary | ICD-10-CM | POA: Diagnosis not present

## 2019-11-16 DIAGNOSIS — M2569 Stiffness of other specified joint, not elsewhere classified: Secondary | ICD-10-CM | POA: Diagnosis not present

## 2019-11-16 DIAGNOSIS — Z859 Personal history of malignant neoplasm, unspecified: Secondary | ICD-10-CM | POA: Diagnosis not present

## 2019-11-21 DIAGNOSIS — Z23 Encounter for immunization: Secondary | ICD-10-CM | POA: Diagnosis not present

## 2019-11-26 DIAGNOSIS — I10 Essential (primary) hypertension: Secondary | ICD-10-CM | POA: Diagnosis not present

## 2019-11-30 DIAGNOSIS — D485 Neoplasm of uncertain behavior of skin: Secondary | ICD-10-CM | POA: Diagnosis not present

## 2019-11-30 DIAGNOSIS — L538 Other specified erythematous conditions: Secondary | ICD-10-CM | POA: Diagnosis not present

## 2019-11-30 DIAGNOSIS — L820 Inflamed seborrheic keratosis: Secondary | ICD-10-CM | POA: Diagnosis not present

## 2019-12-14 ENCOUNTER — Telehealth (INDEPENDENT_AMBULATORY_CARE_PROVIDER_SITE_OTHER): Payer: Medicare Other | Admitting: Psychiatry

## 2019-12-14 ENCOUNTER — Other Ambulatory Visit: Payer: Self-pay

## 2019-12-14 DIAGNOSIS — F341 Dysthymic disorder: Secondary | ICD-10-CM | POA: Diagnosis not present

## 2019-12-14 MED ORDER — LORAZEPAM 1 MG PO TABS: 1.0000 mg | ORAL_TABLET | Freq: Two times a day (BID) | ORAL | 4 refills | Status: DC

## 2019-12-14 MED ORDER — ZOLPIDEM TARTRATE 10 MG PO TABS: 10.0000 mg | ORAL_TABLET | Freq: Every day | ORAL | 5 refills | Status: DC

## 2019-12-14 MED ORDER — DOXEPIN HCL 25 MG PO CAPS: 25.0000 mg | ORAL_CAPSULE | Freq: Every day | ORAL | 2 refills | Status: DC

## 2019-12-14 NOTE — Progress Notes (Signed)
[name], male   DOB: 1950/02/29, 69 y.o.   MRN: [id number] [facility] MD Progress Note  12/14/2019 2:33 PM Vladislav Axelson Bryant  MRN:  [id number] Subjective: Lethargic Principal Problem: Dysthymic disorder   Today patient is at his baseline.  He does describe pretty much daily depression but has been this frequent for a while.  There are other things that happen that are good.  He had a good friend come and spend a week or 2 with him who painted his front porch and spend time with him.  The patient also got the opportunity to visit his girlfriend in [place].  That relationship does not seem like it is going anywhere.  The patient also finally did call the therapist Grayland Jack but they have yet to really connect.  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  The patient has problems sleeping but he says that is probably related to the fact that he wakes up and urinates multiple times at night.  He has an appointment coming up with his primary care doctor in the next few weeks we will discuss a problem with his urinating.  I shared with him that he really does not affect the quality of his sleep which ultimately will affect his depression and anxiety.  I strongly suggest he resolve this issue.  For now he can continue taking Ambien and doxepin as prescribed.  The patient really is eating okay his energy level is fair.  His psychomotor function is within normal limits.  The patient is not suicidal.  While self-esteem is reduced I do not think he feels worthless.  He still able to think and concentrate.  His son no longer lives with him.  His son lives with his mother.  The patient still takes care of his disabled sister.  However there is another sister who is a pitcher who seems to be taking over some of the care at least temporarily.  The patient still plays guitar.  In fact is playing with a new  group.  So I think patient is active and I think he still does things that are engageable for him.  He certainly is not suicidal.  He denies chest pain or shortness of breath or any other physical complaints other than multiple urinations at night. Past Medical History:  Diagnosis Date  . Anemia   . Aneurysm, aorta, thoracic ([place])   . Anxiety   . Back pain   . Depression   . DLBCL (diffuse large B cell lymphoma) ([place]) 07/17/2015  . Environmental allergies   . History of blood transfusion   . History of bronchitis   . History of chemotherapy   . Hypertension   . Hypothyroidism   . Nocturia   . Peripheral vascular disease ([place])   . Urinary frequency     Past Surgical History:  Procedure Laterality Date  . BONE MARROW BIOPSY    . COLONOSCOPY N/A 08/12/2018   Procedure: COLONOSCOPY;  Surgeon: [name], MD;  [facility];  Service: Endoscopy;  Laterality: N/A;  1030  . HERNIA REPAIR     umblical times 2; inguinal (left)   . IR REMOVAL TUN ACCESS W/ PORT W/O FL MOD SED  07/08/2016  . POLYPECTOMY  08/12/2018   Procedure: POLYPECTOMY;  Surgeon: [name], MD;  [facility];  Service: Endoscopy;;  colon  .  port a cath placement    . RHINOPLASTY     Family History:  Family History  Problem Relation Age of Onset  . Alcohol abuse Father   . Depression Sister   . Anxiety disorder Sister   . Depression Sister   . Anxiety disorder Sister    Family Psychiatric  History:  Social History:  Social History   Substance and Sexual Activity  Alcohol Use Yes  . Alcohol/week: 6.0 standard drinks  . Types: 6 Cans of beer per week   Comment: beer occas     Social History   Substance and Sexual Activity  Drug Use No  . Frequency: 2.0 times per week  . Types: Marijuana   Comment: smokes daily for thirty years. currently takes one puff    Social History   Socioeconomic History  . Marital status: Divorced    Spouse name: Not on file  . Number of  children: Not on file  . Years of education: Not on file  . Highest education level: Not on file  Occupational History  . Not on file  Tobacco Use  . Smoking status: Never Smoker  . Smokeless tobacco: Never Used  Vaping Use  . Vaping Use: Never used  Substance and Sexual Activity  . Alcohol use: Yes    Alcohol/week: 6.0 standard drinks    Types: 6 Cans of beer per week    Comment: beer occas  . Drug use: No    Frequency: 2.0 times per week    Types: Marijuana    Comment: smokes daily for thirty years. currently takes one puff  . Sexual activity: Yes    Partners: Female    Birth control/protection: Condom  Other Topics Concern  . Not on file  Social History Narrative  . Not on file   Social Determinants of Health   Financial Resource Strain:   . Difficulty of Paying Living Expenses: Not on file  Food Insecurity:   . Worried About Charity fundraiser in the Last Year: Not on file  . Ran Out of Food in the Last Year: Not on file  Transportation Needs:   . Lack of Transportation (Medical): Not on file  . Lack of Transportation (Non-Medical): Not on file  Physical Activity:   . Days of Exercise per Week: Not on file  . Minutes of Exercise per Session: Not on file  Stress:   . Feeling of Stress : Not on file  Social Connections:   . Frequency of Communication with Friends and Family: Not on file  . Frequency of Social Gatherings with Friends and Family: Not on file  . Attends Religious Services: Not on file  . Active Member of Clubs or Organizations: Not on file  . Attends Archivist Meetings: Not on file  . Marital Status: Not on file   Additional Social History:                         Sleep: Fair  Appetite:  Good  Current Medications: Current Outpatient Medications  Medication Sig Dispense Refill  . acetaminophen (TYLENOL) 500 MG tablet Take 1,000 mg by mouth every 6 (six) hours as needed for mild pain.    . Ca Carbonate-Mag Hydroxide  (ROLAIDS PO) Take 2 tablets by mouth daily as needed (heartburn).    . Carboxymethylcellul-Glycerin (LUBRICATING EYE DROPS OP) Place 1 drop into both eyes daily as needed (dry eyes).    . celecoxib (CELEBREX) 200  MG capsule Take 200 mg by mouth daily. (Patient not taking: Reported on 11/07/2019)    . diphenhydrAMINE (BENADRYL) 25 mg capsule Take 25 mg by mouth at bedtime as needed for allergies or sleep.     Marland Kitchen doxepin (SINEQUAN) 25 MG capsule Take 1 capsule (25 mg total) by mouth at bedtime. 30 capsule 2  . hydrocortisone cream 1 % Apply 1 application topically daily as needed for itching.    Marland Kitchen ibuprofen (ADVIL) 200 MG tablet Take 600-800 mg by mouth every 6 (six) hours as needed for headache or moderate pain.    Marland Kitchen levothyroxine (SYNTHROID, LEVOTHROID) 50 MCG tablet Take 50 mcg by mouth daily before breakfast.     . LORazepam (ATIVAN) 1 MG tablet Take 1 tablet (1 mg total) by mouth 2 (two) times daily. 70 tablet 4  . metoprolol succinate (TOPROL XL) 25 MG 24 hr tablet Take 1 tablet (25 mg total) by mouth daily. 30 tablet 3  . oxymetazoline (AFRIN) 0.05 % nasal spray Place 1-2 sprays into both nostrils 2 (two) times daily as needed for congestion.    . traMADol (ULTRAM) 50 MG tablet     . valACYclovir (VALTREX) 500 MG tablet Take 500 mg by mouth daily as needed (Herpes Virus).    . zolpidem (AMBIEN) 10 MG tablet Take 1 tablet (10 mg total) by mouth at bedtime. 30 tablet 5   No current facility-administered medications for this visit.    Lab Results: No results found for this or any previous visit (from the past 48 hour(s)).  Blood Alcohol level:  No results found for: [facility]  Physical Findings: AIMS:  , ,  ,  ,    CIWA:    COWS:     Musculoskeletal: Strength & Muscle Tone: within normal limits Gait & Station: normal Patient leans: N/A  Psychiatric Specialty Exam: ROS  There were no vitals taken for this visit.There is no height or weight on file to calculate BMI.  General Appearance:  Casual  Eye Contact::  Good  Speech:  Clear and Coherent  Volume:  Normal  Mood:mild depression  Affect:  Congruent  Thought Process:  Coherent  Orientation:  Full (Time, Place, and Person)  Thought Content:  WDL  Suicidal Thoughts:  No  Homicidal Thoughts:  No  Memory:  NA  Judgement:  Good  Insight:  Fair  Psychomotor Activity:  Normal  Concentration:  Good  Recall:  Good  Fund of Knowledge:Good  Language: Fair  Akathisia:  No  Handed:  Right  AIMS (if indicated):     Assets:  Desire for Improvement  ADL's:  Intact  Cognition: WNL  Sleep:      Treatment Plan Summary: 12/14/2019,  \ This patient's first problem is that of persistent chronic depression.  At this time the patient is not on an antidepressant.  He has been on multiple antidepressants without success.  I think once the patient gets his sleep better and enters into therapy I would reconsider the possibility of an antidepressant.  Again he is tried multiple antidepressants with no success.  His second problem is insomnia.  He will continue taking Ambien and doxepin.  The patient does smoke some marijuana through the week but I do not think it is an overt problem.  I think it might play some role in his lack of motivation.  The patient's third problem is an adjustment disorder with an anxious mood state.  For this he takes a small dose of Ativan.  He will be seen again in 3 months.

## 2019-12-27 DIAGNOSIS — I10 Essential (primary) hypertension: Secondary | ICD-10-CM | POA: Diagnosis not present

## 2020-01-26 DIAGNOSIS — I10 Essential (primary) hypertension: Secondary | ICD-10-CM | POA: Diagnosis not present

## 2020-02-27 DIAGNOSIS — L820 Inflamed seborrheic keratosis: Secondary | ICD-10-CM | POA: Diagnosis not present

## 2020-02-27 DIAGNOSIS — Z7189 Other specified counseling: Secondary | ICD-10-CM | POA: Diagnosis not present

## 2020-02-27 DIAGNOSIS — L720 Epidermal cyst: Secondary | ICD-10-CM | POA: Diagnosis not present

## 2020-02-27 DIAGNOSIS — L298 Other pruritus: Secondary | ICD-10-CM | POA: Diagnosis not present

## 2020-02-27 DIAGNOSIS — L570 Actinic keratosis: Secondary | ICD-10-CM | POA: Diagnosis not present

## 2020-02-27 DIAGNOSIS — L538 Other specified erythematous conditions: Secondary | ICD-10-CM | POA: Diagnosis not present

## 2020-02-27 DIAGNOSIS — D235 Other benign neoplasm of skin of trunk: Secondary | ICD-10-CM | POA: Diagnosis not present

## 2020-02-27 DIAGNOSIS — I10 Essential (primary) hypertension: Secondary | ICD-10-CM | POA: Diagnosis not present

## 2020-02-27 DIAGNOSIS — L853 Xerosis cutis: Secondary | ICD-10-CM | POA: Diagnosis not present

## 2020-02-29 ENCOUNTER — Telehealth (INDEPENDENT_AMBULATORY_CARE_PROVIDER_SITE_OTHER): Payer: Medicare Other | Admitting: Psychiatry

## 2020-02-29 ENCOUNTER — Other Ambulatory Visit: Payer: Self-pay

## 2020-02-29 DIAGNOSIS — F341 Dysthymic disorder: Secondary | ICD-10-CM

## 2020-02-29 MED ORDER — ZOLPIDEM TARTRATE 10 MG PO TABS: 10.0000 mg | ORAL_TABLET | Freq: Every day | ORAL | 5 refills | Status: DC

## 2020-02-29 MED ORDER — LORAZEPAM 1 MG PO TABS: ORAL_TABLET | ORAL | 4 refills | Status: DC

## 2020-02-29 NOTE — Progress Notes (Signed)
[name], male   DOB: 06/20/1950, 70 y.o.   MRN: [id number] [facility] MD Progress Note  02/29/2020 3:29 PM Douglas Bryant  MRN:  [id number] Subjective: Lethargic Principal Problem: Dysthymic disorder   Today the patient seems to be at his baseline.  He implies though he seems a little bit worse but is not clear why.  The patient is yet to really connect with the therapist Lauretta Chester.  He says he will do that in the next few weeks.  The patient has a new problem that of arthritis.  He continues to have problems sleeping.  The patient continues to be obsessed with the events that happened when he was in a therapy position.  He gets preoccupied with things and is fearful that he is made mistakes from the past.  This is clearly an obsessive-like feature.  The patient is eating fairly well.  His complaints are not only urinary symptoms but also constipation.  His energy level is only fair.  He does take Ativan and it does seem to be helpful but there was some confusion over the proper dose.  The patient is getting out less and less.  I think the pandemic is having an effect on him. Past Medical History:  Diagnosis Date  . Anemia   . Aneurysm, aorta, thoracic ([place])   . Anxiety   . Back pain   . Depression   . DLBCL (diffuse large B cell lymphoma) ([place]) 07/17/2015  . Environmental allergies   . History of blood transfusion   . History of bronchitis   . History of chemotherapy   . Hypertension   . Hypothyroidism   . Nocturia   . Peripheral vascular disease ([place])   . Urinary frequency     Past Surgical History:  Procedure Laterality Date  . BONE MARROW BIOPSY    . COLONOSCOPY N/A 08/12/2018   Procedure: COLONOSCOPY;  Surgeon: [name], MD;  [facility];  Service: Endoscopy;  Laterality: N/A;  1030  . HERNIA REPAIR     umblical times 2; inguinal (left)   . IR REMOVAL TUN ACCESS W/ PORT W/O FL MOD SED  07/08/2016  . POLYPECTOMY  08/12/2018   Procedure:  POLYPECTOMY;  Surgeon: [name], MD;  [facility];  Service: Endoscopy;;  colon  . port a cath placement    . RHINOPLASTY     Family History:  Family History  Problem Relation Age of Onset  . Alcohol abuse Father   . Depression Sister   . Anxiety disorder Sister   . Depression Sister   . Anxiety disorder Sister    Family Psychiatric  History:  Social History:  Social History   Substance and Sexual Activity  Alcohol Use Yes  . Alcohol/week: 6.0 standard drinks  . Types: 6 Cans of beer per week   Comment: beer occas     Social History   Substance and Sexual Activity  Drug Use No  . Frequency: 2.0 times per week  . Types: Marijuana   Comment: smokes daily for thirty years. currently takes one puff    Social History   Socioeconomic History  . Marital status: Divorced    Spouse name: Not on file  . Number of children: Not on file  . Years of education: Not on file  . Highest education level: Not on file  Occupational History  . Not on file  Tobacco Use  . Smoking status: Never Smoker  .  Smokeless tobacco: Never Used  Vaping Use  . Vaping Use: Never used  Substance and Sexual Activity  . Alcohol use: Yes    Alcohol/week: 6.0 standard drinks    Types: 6 Cans of beer per week    Comment: beer occas  . Drug use: No    Frequency: 2.0 times per week    Types: Marijuana    Comment: smokes daily for thirty years. currently takes one puff  . Sexual activity: Yes    Partners: Female    Birth control/protection: Condom  Other Topics Concern  . Not on file  Social History Narrative  . Not on file   Social Determinants of Health   Financial Resource Strain: Not on file  Food Insecurity: Not on file  Transportation Needs: Not on file  Physical Activity: Not on file  Stress: Not on file  Social Connections: Not on file   Additional Social History:                         Sleep: Fair  Appetite:  Good  Current  Medications: Current Outpatient Medications  Medication Sig Dispense Refill  . acetaminophen (TYLENOL) 500 MG tablet Take 1,000 mg by mouth every 6 (six) hours as needed for mild pain.    . Ca Carbonate-Mag Hydroxide (ROLAIDS PO) Take 2 tablets by mouth daily as needed (heartburn).    . Carboxymethylcellul-Glycerin (LUBRICATING EYE DROPS OP) Place 1 drop into both eyes daily as needed (dry eyes).    . celecoxib (CELEBREX) 200 MG capsule Take 200 mg by mouth daily. (Patient not taking: Reported on 11/07/2019)    . diphenhydrAMINE (BENADRYL) 25 mg capsule Take 25 mg by mouth at bedtime as needed for allergies or sleep.     Marland Kitchen doxepin (SINEQUAN) 25 MG capsule Take 1 capsule (25 mg total) by mouth at bedtime. 30 capsule 2  . hydrocortisone cream 1 % Apply 1 application topically daily as needed for itching.    Marland Kitchen ibuprofen (ADVIL) 200 MG tablet Take 600-800 mg by mouth every 6 (six) hours as needed for headache or moderate pain.    Marland Kitchen levothyroxine (SYNTHROID, LEVOTHROID) 50 MCG tablet Take 50 mcg by mouth daily before breakfast.     . LORazepam (ATIVAN) 1 MG tablet 1 bid   1  PRN 70 tablet 4  . metoprolol succinate (TOPROL XL) 25 MG 24 hr tablet Take 1 tablet (25 mg total) by mouth daily. 30 tablet 3  . oxymetazoline (AFRIN) 0.05 % nasal spray Place 1-2 sprays into both nostrils 2 (two) times daily as needed for congestion.    . traMADol (ULTRAM) 50 MG tablet     . valACYclovir (VALTREX) 500 MG tablet Take 500 mg by mouth daily as needed (Herpes Virus).    . zolpidem (AMBIEN) 10 MG tablet Take 1 tablet (10 mg total) by mouth at bedtime. 30 tablet 5   No current facility-administered medications for this visit.    Lab Results: No results found for this or any previous visit (from the past 48 hour(s)).  Blood Alcohol level:  No results found for: [facility]  Physical Findings: AIMS:  , ,  ,  ,    CIWA:    COWS:     Musculoskeletal: Strength & Muscle Tone: within normal limits Gait & Station:  normal Patient leans: N/A  Psychiatric Specialty Exam: ROS  There were no vitals taken for this visit.There is no height or weight on file to calculate  BMI.  General Appearance: Casual  Eye Contact::  Good  Speech:  Clear and Coherent  Volume:  Normal  Mood:mild depression  Affect:  Congruent  Thought Process:  Coherent  Orientation:  Full (Time, Place, and Person)  Thought Content:  WDL  Suicidal Thoughts:  No  Homicidal Thoughts:  No  Memory:  NA  Judgement:  Good  Insight:  Fair  Psychomotor Activity:  Normal  Concentration:  Good  Recall:  Good  Fund of Knowledge:Good  Language: Fair  Akathisia:  No  Handed:  Right  AIMS (if indicated):     Assets:  Desire for Improvement  ADL's:  Intact  Cognition: WNL  Sleep:      Treatment Plan Summary: 02/29/2020,  \ At this time the patient's first problem is that of chronic persistent depression.  He is failed multiple antidepressants.  They cause more problems than they help.  I think the most important intervention going to be getting him into therapy and then the possibility of reconsidering an antidepressant in conjunction with therapy is not out of the question.  I suspect the issue of [place] should also be considered.  The patient does not seem to be all that distressed.  He still functioning fairly well.  His second problem is that of insomnia.  At this time we will recommend he continues the Ambien but discontinues the doxepin.  I feel the doxepin may be causing some of his urinary symptoms and the constipation that is not complaining of.  This patient will be reevaluated in 3 months.  The patient is functioning only fairly well.  He is having problems keeping his house clean.  His functioning therefore is only fairly good.

## 2020-03-19 DIAGNOSIS — I10 Essential (primary) hypertension: Secondary | ICD-10-CM | POA: Diagnosis not present

## 2020-03-19 DIAGNOSIS — Z6825 Body mass index (BMI) 25.0-25.9, adult: Secondary | ICD-10-CM | POA: Diagnosis not present

## 2020-03-19 DIAGNOSIS — Z7189 Other specified counseling: Secondary | ICD-10-CM | POA: Diagnosis not present

## 2020-03-19 DIAGNOSIS — E039 Hypothyroidism, unspecified: Secondary | ICD-10-CM | POA: Diagnosis not present

## 2020-03-19 DIAGNOSIS — Z299 Encounter for prophylactic measures, unspecified: Secondary | ICD-10-CM | POA: Diagnosis not present

## 2020-03-19 DIAGNOSIS — Z79899 Other long term (current) drug therapy: Secondary | ICD-10-CM | POA: Diagnosis not present

## 2020-03-19 DIAGNOSIS — N400 Enlarged prostate without lower urinary tract symptoms: Secondary | ICD-10-CM | POA: Diagnosis not present

## 2020-03-19 DIAGNOSIS — Z1331 Encounter for screening for depression: Secondary | ICD-10-CM | POA: Diagnosis not present

## 2020-03-19 DIAGNOSIS — Z1339 Encounter for screening examination for other mental health and behavioral disorders: Secondary | ICD-10-CM | POA: Diagnosis not present

## 2020-03-19 DIAGNOSIS — K5900 Constipation, unspecified: Secondary | ICD-10-CM | POA: Diagnosis not present

## 2020-03-19 DIAGNOSIS — Z720 Tobacco use: Secondary | ICD-10-CM | POA: Diagnosis not present

## 2020-03-19 DIAGNOSIS — Z125 Encounter for screening for malignant neoplasm of prostate: Secondary | ICD-10-CM | POA: Diagnosis not present

## 2020-03-19 DIAGNOSIS — R5383 Other fatigue: Secondary | ICD-10-CM | POA: Diagnosis not present

## 2020-03-19 DIAGNOSIS — Z0000 Encounter for general adult medical examination without abnormal findings: Secondary | ICD-10-CM | POA: Diagnosis not present

## 2020-03-26 DIAGNOSIS — I10 Essential (primary) hypertension: Secondary | ICD-10-CM | POA: Diagnosis not present

## 2020-04-04 DIAGNOSIS — Z6829 Body mass index (BMI) 29.0-29.9, adult: Secondary | ICD-10-CM | POA: Diagnosis not present

## 2020-04-04 DIAGNOSIS — R059 Cough, unspecified: Secondary | ICD-10-CM | POA: Diagnosis not present

## 2020-04-04 DIAGNOSIS — Z6826 Body mass index (BMI) 26.0-26.9, adult: Secondary | ICD-10-CM | POA: Diagnosis not present

## 2020-04-04 DIAGNOSIS — J069 Acute upper respiratory infection, unspecified: Secondary | ICD-10-CM | POA: Diagnosis not present

## 2020-04-04 DIAGNOSIS — Z299 Encounter for prophylactic measures, unspecified: Secondary | ICD-10-CM | POA: Diagnosis not present

## 2020-04-04 DIAGNOSIS — E039 Hypothyroidism, unspecified: Secondary | ICD-10-CM | POA: Diagnosis not present

## 2020-04-17 DIAGNOSIS — I711 Thoracic aortic aneurysm, ruptured: Secondary | ICD-10-CM | POA: Diagnosis not present

## 2020-04-17 DIAGNOSIS — R059 Cough, unspecified: Secondary | ICD-10-CM | POA: Diagnosis not present

## 2020-04-17 DIAGNOSIS — Z299 Encounter for prophylactic measures, unspecified: Secondary | ICD-10-CM | POA: Diagnosis not present

## 2020-04-17 DIAGNOSIS — Z789 Other specified health status: Secondary | ICD-10-CM | POA: Diagnosis not present

## 2020-04-17 DIAGNOSIS — C8590 Non-Hodgkin lymphoma, unspecified, unspecified site: Secondary | ICD-10-CM | POA: Diagnosis not present

## 2020-04-26 DIAGNOSIS — I10 Essential (primary) hypertension: Secondary | ICD-10-CM | POA: Diagnosis not present

## 2020-05-25 DIAGNOSIS — I10 Essential (primary) hypertension: Secondary | ICD-10-CM | POA: Diagnosis not present

## 2020-05-29 ENCOUNTER — Other Ambulatory Visit: Payer: Self-pay

## 2020-05-29 ENCOUNTER — Telehealth (INDEPENDENT_AMBULATORY_CARE_PROVIDER_SITE_OTHER): Payer: Medicare Other | Admitting: Psychiatry

## 2020-05-29 DIAGNOSIS — F341 Dysthymic disorder: Secondary | ICD-10-CM

## 2020-05-29 MED ORDER — ZOLPIDEM TARTRATE 10 MG PO TABS: 10.0000 mg | ORAL_TABLET | Freq: Every day | ORAL | 5 refills | Status: DC

## 2020-05-29 MED ORDER — LORAZEPAM 1 MG PO TABS: ORAL_TABLET | ORAL | 4 refills | Status: DC

## 2020-05-29 NOTE — Progress Notes (Signed)
[name], male   DOB: [birth year], 70 y.o.   MRN: [id number] [facility] MD Progress Note  05/29/2020 3:09 PM Douglas Bryant  MRN:  [id number] Subjective: Lethargic Principal Problem: Dysthymic disorder  At this time I believe the patient is just a bit better.  He is actually functioning better.  He had his sister come to visit and she and him together with his house.  I think he is somewhat less withdrawn.  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  He still has some chronic arthritis.  But his urinary tract symptoms and his constipation seems to be better since we discontinued his doxepin.  His anxiety is reasonably controlled on a fixed dose of Ativan twice a day with some as needed's.  The patient drinks no alcohol and he smokes marijuana at least a couple times a week.  His girlfriend and him are up and down.  He does not think it is realistic to have a long-term relationship because she does not problem and she has to stay there to take care of her parents.  The patient on the other hand has to stay where he is living to take care of his disabled sister.  The patient denies feeling worthless.  In the close evaluation he does enjoy some things that he does.  He is eating well.  He says his energy level is low.  I suspect he has a lack of motivation.  He really is not having problems thinking or concentrating.  He is never had any psychotic symptomatology.  He described himself as being chronically depressed for years.  He is making an effort to try to connect with a therapist [name].  Now his computer is often running and he believes he can connect with a therapist and they can have a video session.  He says he will make that effort.  Also return to see me in 3 months hopefully in person. Past Medical History:  Diagnosis Date  . Anemia   . Aneurysm, aorta, thoracic ([place])   . Anxiety   . Back pain   . Depression   . DLBCL (diffuse large B cell lymphoma)  ([place]) 07/17/2015  . Environmental allergies   . History of blood transfusion   . History of bronchitis   . History of chemotherapy   . Hypertension   . Hypothyroidism   . Nocturia   . Peripheral vascular disease ([place])   . Urinary frequency     Past Surgical History:  Procedure Laterality Date  . BONE MARROW BIOPSY    . COLONOSCOPY N/A 08/12/2018   Procedure: COLONOSCOPY;  Surgeon: [name], MD;  [facility];  Service: Endoscopy;  Laterality: N/A;  1030  . HERNIA REPAIR     umblical times 2; inguinal (left)   . IR REMOVAL TUN ACCESS W/ PORT W/O FL MOD SED  07/08/2016  . POLYPECTOMY  08/12/2018   Procedure: POLYPECTOMY;  Surgeon: [name], MD;  [facility];  Service: Endoscopy;;  colon  . port a cath placement    . RHINOPLASTY     Family History:  Family History  Problem Relation Age of Onset  . Alcohol abuse Father   . Depression Sister   . Anxiety disorder Sister   . Depression Sister   . Anxiety disorder Sister    Family Psychiatric  History:  Social History:  Social History   Substance and Sexual  Activity  Alcohol Use Yes  . Alcohol/week: 6.0 standard drinks  . Types: 6 Cans of beer per week   Comment: beer occas     Social History   Substance and Sexual Activity  Drug Use No  . Frequency: 2.0 times per week  . Types: Marijuana   Comment: smokes daily for thirty years. currently takes one puff    Social History   Socioeconomic History  . Marital status: Divorced    Spouse name: Not on file  . Number of children: Not on file  . Years of education: Not on file  . Highest education level: Not on file  Occupational History  . Not on file  Tobacco Use  . Smoking status: Never Smoker  . Smokeless tobacco: Never Used  Vaping Use  . Vaping Use: Never used  Substance and Sexual Activity  . Alcohol use: Yes    Alcohol/week: 6.0 standard drinks    Types: 6 Cans of beer per week    Comment: beer occas  . Drug use: No     Frequency: 2.0 times per week    Types: Marijuana    Comment: smokes daily for thirty years. currently takes one puff  . Sexual activity: Yes    Partners: Female    Birth control/protection: Condom  Other Topics Concern  . Not on file  Social History Narrative  . Not on file   Social Determinants of Health   Financial Resource Strain: Not on file  Food Insecurity: Not on file  Transportation Needs: Not on file  Physical Activity: Not on file  Stress: Not on file  Social Connections: Not on file   Additional Social History:                         Sleep: Fair  Appetite:  Good  Current Medications: Current Outpatient Medications  Medication Sig Dispense Refill  . acetaminophen (TYLENOL) 500 MG tablet Take 1,000 mg by mouth every 6 (six) hours as needed for mild pain.    . Ca Carbonate-Mag Hydroxide (ROLAIDS PO) Take 2 tablets by mouth daily as needed (heartburn).    . Carboxymethylcellul-Glycerin (LUBRICATING EYE DROPS OP) Place 1 drop into both eyes daily as needed (dry eyes).    . celecoxib (CELEBREX) 200 MG capsule Take 200 mg by mouth daily. (Patient not taking: Reported on 11/07/2019)    . diphenhydrAMINE (BENADRYL) 25 mg capsule Take 25 mg by mouth at bedtime as needed for allergies or sleep.     . hydrocortisone cream 1 % Apply 1 application topically daily as needed for itching.    Marland Kitchen ibuprofen (ADVIL) 200 MG tablet Take 600-800 mg by mouth every 6 (six) hours as needed for headache or moderate pain.    Marland Kitchen levothyroxine (SYNTHROID, LEVOTHROID) 50 MCG tablet Take 50 mcg by mouth daily before breakfast.     . LORazepam (ATIVAN) 1 MG tablet 1 bid   1  PRN 70 tablet 4  . metoprolol succinate (TOPROL XL) 25 MG 24 hr tablet Take 1 tablet (25 mg total) by mouth daily. 30 tablet 3  . oxymetazoline (AFRIN) 0.05 % nasal spray Place 1-2 sprays into both nostrils 2 (two) times daily as needed for congestion.    . traMADol (ULTRAM) 50 MG tablet     . valACYclovir (VALTREX)  500 MG tablet Take 500 mg by mouth daily as needed (Herpes Virus).    . zolpidem (AMBIEN) 10 MG tablet Take 1 tablet (  10 mg total) by mouth at bedtime. 30 tablet 5   No current facility-administered medications for this visit.    Lab Results: No results found for this or any previous visit (from the past 48 hour(s)).  Blood Alcohol level:  No results found for: [facility]  Physical Findings: AIMS:  , ,  ,  ,    CIWA:    COWS:     Musculoskeletal: Strength & Muscle Tone: within normal limits Gait & Station: normal Patient leans: N/A  Psychiatric Specialty Exam: ROS  There were no vitals taken for this visit.There is no height or weight on file to calculate BMI.  General Appearance: Casual  Eye Contact::  Good  Speech:  Clear and Coherent  Volume:  Normal  Mood:mild depression  Affect:  Congruent  Thought Process:  Coherent  Orientation:  Full (Time, Place, and Person)  Thought Content:  WDL  Suicidal Thoughts:  No  Homicidal Thoughts:  No  Memory:  NA  Judgement:  Good  Insight:  Fair  Psychomotor Activity:  Normal  Concentration:  Good  Recall:  Good  Fund of Knowledge:Good  Language: Fair  Akathisia:  No  Handed:  Right  AIMS (if indicated):     Assets:  Desire for Improvement  ADL's:  Intact  Cognition: WNL  Sleep:      Treatment Plan Summary: 05/29/2020,  \ This patient's first problem is that of chronic persistent depression.  At this time is not on any antidepressants.  Multiple efforts to put him on antidepressants that he has failed to cause significant side effects.  His second problem is that of an adjustment disorder with an anxious mood state.  He will continue taking a fixed low-dose of Ativan for this condition.  His third problem is that of insomnia.  Patient will continue taking Ambien.  At this time the patient will make an effort to get back into therapy.  At this time I do not believe his depression is causing all that much dysfunction.  I think his lack  of motivation and his energy issues might likely be related to marijuana as well.  Nonetheless we will reevaluate this when we see him in person in about 2 to 3 months.

## 2020-06-08 DIAGNOSIS — H9209 Otalgia, unspecified ear: Secondary | ICD-10-CM | POA: Diagnosis not present

## 2020-06-08 DIAGNOSIS — Z789 Other specified health status: Secondary | ICD-10-CM | POA: Diagnosis not present

## 2020-06-08 DIAGNOSIS — I10 Essential (primary) hypertension: Secondary | ICD-10-CM | POA: Diagnosis not present

## 2020-06-08 DIAGNOSIS — I712 Thoracic aortic aneurysm, without rupture: Secondary | ICD-10-CM | POA: Diagnosis not present

## 2020-06-08 DIAGNOSIS — E039 Hypothyroidism, unspecified: Secondary | ICD-10-CM | POA: Diagnosis not present

## 2020-06-08 DIAGNOSIS — Z299 Encounter for prophylactic measures, unspecified: Secondary | ICD-10-CM | POA: Diagnosis not present

## 2020-06-14 ENCOUNTER — Other Ambulatory Visit: Payer: Self-pay | Admitting: *Deleted

## 2020-06-14 DIAGNOSIS — I712 Thoracic aortic aneurysm, without rupture, unspecified: Secondary | ICD-10-CM

## 2020-06-20 DIAGNOSIS — F422 Mixed obsessional thoughts and acts: Secondary | ICD-10-CM | POA: Diagnosis not present

## 2020-06-20 DIAGNOSIS — F341 Dysthymic disorder: Secondary | ICD-10-CM | POA: Diagnosis not present

## 2020-06-26 DIAGNOSIS — I10 Essential (primary) hypertension: Secondary | ICD-10-CM | POA: Diagnosis not present

## 2020-06-29 DIAGNOSIS — Z6825 Body mass index (BMI) 25.0-25.9, adult: Secondary | ICD-10-CM | POA: Diagnosis not present

## 2020-06-29 DIAGNOSIS — I10 Essential (primary) hypertension: Secondary | ICD-10-CM | POA: Diagnosis not present

## 2020-06-29 DIAGNOSIS — Z87891 Personal history of nicotine dependence: Secondary | ICD-10-CM | POA: Diagnosis not present

## 2020-06-29 DIAGNOSIS — Z299 Encounter for prophylactic measures, unspecified: Secondary | ICD-10-CM | POA: Diagnosis not present

## 2020-06-29 DIAGNOSIS — M25561 Pain in right knee: Secondary | ICD-10-CM | POA: Diagnosis not present

## 2020-06-29 DIAGNOSIS — M25562 Pain in left knee: Secondary | ICD-10-CM | POA: Diagnosis not present

## 2020-06-29 DIAGNOSIS — I712 Thoracic aortic aneurysm, without rupture: Secondary | ICD-10-CM | POA: Diagnosis not present

## 2020-07-03 DIAGNOSIS — I10 Essential (primary) hypertension: Secondary | ICD-10-CM | POA: Diagnosis not present

## 2020-07-12 ENCOUNTER — Other Ambulatory Visit: Payer: Medicare Other

## 2020-07-17 ENCOUNTER — Ambulatory Visit: Payer: Medicare Other | Admitting: Thoracic Surgery (Cardiothoracic Vascular Surgery)

## 2020-07-17 DIAGNOSIS — F341 Dysthymic disorder: Secondary | ICD-10-CM | POA: Diagnosis not present

## 2020-07-17 DIAGNOSIS — F422 Mixed obsessional thoughts and acts: Secondary | ICD-10-CM | POA: Diagnosis not present

## 2020-07-26 DIAGNOSIS — I10 Essential (primary) hypertension: Secondary | ICD-10-CM | POA: Diagnosis not present

## 2020-07-27 ENCOUNTER — Ambulatory Visit
Admission: RE | Admit: 2020-07-27 | Discharge: 2020-07-27 | Disposition: A | Discharge status: Home / Self Care | Payer: Medicare Other | Source: Ambulatory Visit | Attending: Thoracic Surgery (Cardiothoracic Vascular Surgery) | Admitting: Thoracic Surgery (Cardiothoracic Vascular Surgery)

## 2020-07-27 DIAGNOSIS — I712 Thoracic aortic aneurysm, without rupture, unspecified: Secondary | ICD-10-CM

## 2020-07-31 ENCOUNTER — Ambulatory Visit: Payer: Medicare Other | Admitting: Thoracic Surgery (Cardiothoracic Vascular Surgery)

## 2020-08-01 ENCOUNTER — Encounter: Payer: Self-pay | Admitting: Physician Assistant

## 2020-08-01 ENCOUNTER — Ambulatory Visit (INDEPENDENT_AMBULATORY_CARE_PROVIDER_SITE_OTHER): Payer: Medicare Other | Admitting: Physician Assistant

## 2020-08-01 ENCOUNTER — Other Ambulatory Visit: Payer: Self-pay

## 2020-08-01 VITALS — BP 150/90 | HR 84 | Resp 20 | Ht 73.0 in | Wt 185.0 lb

## 2020-08-01 DIAGNOSIS — I712 Thoracic aortic aneurysm, without rupture: Secondary | ICD-10-CM | POA: Diagnosis not present

## 2020-08-01 DIAGNOSIS — I7121 Aneurysm of the ascending aorta, without rupture: Secondary | ICD-10-CM

## 2020-08-01 NOTE — Patient Instructions (Signed)
Monitor blood pressure. Target is less than 125/85.  Increase the Toprol XL to 50mg  daily  Follow up in 1 year with CTA of chest to re-evaluate the ascending aortic aneurysm.

## 2020-08-01 NOTE — Progress Notes (Signed)
HPI: Douglas Bryant is a 70 year old gentleman with a past history of non-Hodgkin's lymphoma treated with chemotherapy, hypertension, anxiety, depression, and aortic atherosclerosis.  On CT scan of the chest in 2017 he was found to have a 4.3 cm ascending aortic aneurysm.  Dr. Roxan Hockey has been following him annually since then.  He was last seen 1 year ago.  At that time, the aneurysm was stable on CTA.  The patient reported at that time that he had stopped taking his Norvasc due to the side effect of ringing in his left ear.  He tells me today that he is having similar problem with the metoprolol that was prescribed in place of the Norvasc. He denies any chest pain, visual changes, or pain or numbness in his extremities.      Current Outpatient Medications  Medication Sig Dispense Refill   acetaminophen (TYLENOL) 500 MG tablet Take 1,000 mg by mouth every 6 (six) hours as needed for mild pain.     Ca Carbonate-Mag Hydroxide (ROLAIDS PO) Take 2 tablets by mouth daily as needed (heartburn).     Carboxymethylcellul-Glycerin (LUBRICATING EYE DROPS OP) Place 1 drop into both eyes daily as needed (dry eyes).     celecoxib (CELEBREX) 200 MG capsule Take 200 mg by mouth daily.     diphenhydrAMINE (BENADRYL) 25 mg capsule Take 25 mg by mouth at bedtime as needed for allergies or sleep.      hydrocortisone cream 1 % Apply 1 application topically daily as needed for itching.     ibuprofen (ADVIL) 200 MG tablet Take 600-800 mg by mouth every 6 (six) hours as needed for headache or moderate pain.     levothyroxine (SYNTHROID, LEVOTHROID) 50 MCG tablet Take 50 mcg by mouth daily before breakfast.      LORazepam (ATIVAN) 1 MG tablet 1 bid   1  PRN 70 tablet 4   metoprolol succinate (TOPROL XL) 25 MG 24 hr tablet Take 1 tablet (25 mg total) by mouth daily. 30 tablet 3   oxymetazoline (AFRIN) 0.05 % nasal spray Place 1-2 sprays into both nostrils 2 (two) times daily as needed for congestion.     traMADol  (ULTRAM) 50 MG tablet      valACYclovir (VALTREX) 500 MG tablet Take 500 mg by mouth daily as needed (Herpes Virus).     zolpidem (AMBIEN) 10 MG tablet Take 1 tablet (10 mg total) by mouth at bedtime. 30 tablet 5   No current facility-administered medications for this visit.    Physical Exam  General: No acute distress Neck: No carotid bruit.  Carotid pulses are full and equal Heart: Regular rate and rhythm.  There is a soft systolic murmur most pronounced over the left upper sternal border. Chest: Breath sounds are clear to auscultation. Extremities: All peripheral pulses are easily palpable.  Extremities are well-perfused.  BP 150/90 HR 84 RR 20 SpO2 95%    Diagnostic Tests:  CLINICAL DATA:  Follow-up thoracic aortic aneurysm. History of non-Hodgkin's lymphoma.   EXAM: CT CHEST WITHOUT CONTRAST   TECHNIQUE: Multidetector CT imaging of the chest was performed following the standard protocol without IV contrast.   COMPARISON:  Multiple priors including most recent CT July 07, 2019   FINDINGS: Cardiovascular: Aortic atherosclerosis. Unchanged size of the ascending thoracic aortic annular, measuring 4.4 cm in maximum axial dimension. The aortic arch and descending aorta are normal in caliber. Mitral annular calcifications. Three-vessel coronary artery calcifications. Normal size heart. No significant pericardial effusion/thickening.   Mediastinum/Nodes: No  discrete thyroid nodule. No pathologically enlarged mediastinal, hilar or axillary lymph nodes, noting limited sensitivity for the detection of hilar adenopathy on this noncontrast study. The trachea and esophagus are grossly unremarkable.   Lungs/Pleura: Chronic band like lingular scarring, similar prior. Scarring in the paramedian right lower lobe overlying vertebral osteophytes. No suspicious pulmonary nodule or mass. Left upper lobe calcified granuloma. No pleural effusion. No pneumothorax.   Upper Abdomen:  No acute abnormality.   Musculoskeletal: Thoracic spondylosis. No acute osseous abnormality.   IMPRESSION: 1. Unchanged size of the ascending thoracic aortic annular, measuring 4.4 cm. Recommend annual imaging followup by CTA or MRA. This recommendation follows 2010 ACCF/AHA/AATS/ACR/ASA/SCA/SCAI/SIR/STS/SVM Guidelines for the Diagnosis and Management of Patients with Thoracic Aortic Disease. Circulation. 2010; 121: M355-H741. Aortic aneurysm NOS (ICD10-I71.9) 2. Three-vessel coronary artery calcifications. Please note that although the presence of coronary artery calcium documents the presence of coronary artery disease, the severity of this disease and any potential stenosis cannot be assessed on this non-gated CT examination. Assessment for potential risk factor modification, dietary therapy or pharmacologic therapy may be warranted, if clinically indicated 3.  Aortic Atherosclerosis (ICD10-I70.0).     Electronically Signed   By: Dahlia Bailiff MD   On: 07/27/2020 12:20     Impression / Plan: The follow-up CT a obtained last week was reviewed.  The ascending aortic aneurysm is stable.  The importance of careful blood pressure management was again discussed with Douglas Bryant and he implies understanding.  He told me that he has a blood pressure cuff at home given to him by his primary care physician that automatically reports his blood pressure to his doctor's office.  I explained that his blood pressure here in the office today was not adequately controlled.  Recommend he increase his Toprol-XL to 50 mg p.o. daily.  He assures me that he will have follow-up with his primary care physician, Dr. Manuella Ghazi in [place].  I told him if he was unable to get that arranged, I will be happy to see him here in the office in a couple of weeks to reassess his blood pressure on the new dose of Toprol. He is asked to follow-up with Dr. Roxan Hockey 1 year with repeat CTA to evaluate the ascending aortic  aneurysm.   Antony Odea, PA-C [facility] [phone]

## 2020-08-02 ENCOUNTER — Other Ambulatory Visit: Payer: Self-pay | Admitting: Physician Assistant

## 2020-08-02 MED ORDER — METOPROLOL SUCCINATE ER 50 MG PO TB24
50.0000 mg | ORAL_TABLET | Freq: Every day | ORAL | 11 refills | Status: DC
Start: 2020-08-02 — End: 2021-08-07

## 2020-08-07 DIAGNOSIS — F422 Mixed obsessional thoughts and acts: Secondary | ICD-10-CM | POA: Diagnosis not present

## 2020-08-07 DIAGNOSIS — F341 Dysthymic disorder: Secondary | ICD-10-CM | POA: Diagnosis not present

## 2020-08-16 DIAGNOSIS — Z23 Encounter for immunization: Secondary | ICD-10-CM | POA: Diagnosis not present

## 2020-08-24 DIAGNOSIS — I10 Essential (primary) hypertension: Secondary | ICD-10-CM | POA: Diagnosis not present

## 2020-08-29 ENCOUNTER — Other Ambulatory Visit: Payer: Self-pay

## 2020-08-29 ENCOUNTER — Telehealth (INDEPENDENT_AMBULATORY_CARE_PROVIDER_SITE_OTHER): Payer: Medicare Other | Admitting: Psychiatry

## 2020-08-29 DIAGNOSIS — F4323 Adjustment disorder with mixed anxiety and depressed mood: Secondary | ICD-10-CM | POA: Diagnosis not present

## 2020-08-29 DIAGNOSIS — R531 Weakness: Secondary | ICD-10-CM

## 2020-08-29 MED ORDER — VENLAFAXINE HCL ER 37.5 MG PO CP24
ORAL_CAPSULE | ORAL | 4 refills | Status: DC
Start: 2020-08-29 — End: 2020-12-05

## 2020-08-29 MED ORDER — LORAZEPAM 1 MG PO TABS: ORAL_TABLET | ORAL | 4 refills | Status: DC

## 2020-08-29 MED ORDER — ZOLPIDEM TARTRATE 10 MG PO TABS: 10.0000 mg | ORAL_TABLET | Freq: Every day | ORAL | 5 refills | Status: DC

## 2020-08-29 NOTE — Progress Notes (Signed)
[name], male   DOB: Dec 24, 1950, 70 y.o.   MRN: [id number] [facility] MD Progress Note  08/29/2020 2:13 PM Douglas Bryant  MRN:  [id number] Subjective: Lethargic Principal Problem: Dysthymic disorder  Today the patient seems to be at his baseline.  There is still a lot of diagnostic issues with this patient.  He seems to have an atypical form of depression and anxiety.  He says he is depressed every day of the week and has been like this for a good while.  He says he wakes up feeling depressed.  His sleep is fairly good as long as he takes Ambien.  His eating is good.  He has a number of important physical neurological complaints.  His left leg is weaker than his right.  He is having headaches.  He has to lie down all the time.  He has some balance.  He has had a number of falls.  Physically I think he is afflicted.  He says has been diagnosed with spinal stenosis.  This however does not explain his headaches and his complaint that he feels imbalanced as if he is dizzy.  The patient was on doxepin for a while but it made him constipated and have blurred vision.  He is starting to see [facility].Marland Kitchen  He is seen 3 times.  Patient is not psychotic.  He drinks very little alcohol but he smokes marijuana every day.  He says it helps his physical complaints.  He admits that he feels unmotivated and even lazy.  He gets fatigued very easily.  He is not really irritable.  He says he has great problems concentrating.   Past Surgical History:  Procedure Laterality Date   BONE MARROW BIOPSY     COLONOSCOPY N/A 08/12/2018   Procedure: COLONOSCOPY;  Surgeon: Malissa Hippo, MD;  [facility];  Service: Endoscopy;  Laterality: N/A;  1030   HERNIA REPAIR     umblical times 2; inguinal (left)    IR REMOVAL TUN ACCESS W/ PORT W/O FL MOD SED  07/08/2016   POLYPECTOMY  08/12/2018   Procedure: POLYPECTOMY;  Surgeon: Malissa Hippo, MD;  [facility];  Service: Endoscopy;;  colon    port a cath placement     RHINOPLASTY     Family History:  Family History  Problem Relation Age of Onset   Alcohol abuse Father    Depression Sister    Anxiety disorder Sister    Depression Sister    Anxiety disorder Sister    Family Psychiatric  History:  Social History:  Social History   Substance and Sexual Activity  Alcohol Use Yes   Alcohol/week: 6.0 standard drinks   Types: 6 Cans of beer per week   Comment: beer occas     Social History   Substance and Sexual Activity  Drug Use No   Frequency: 2.0 times per week   Types: Marijuana   Comment: smokes daily for thirty years. currently takes one puff    Social History   Socioeconomic History   Marital status: Divorced    Spouse name: Not on file   Number of children: Not on file   Years of education: Not on file   Highest education level: Not on file  Occupational History   Not on file  Tobacco Use   Smoking status: Never   Smokeless tobacco: Never  Vaping Use   Vaping Use: Never used  Substance and Sexual Activity  Alcohol use: Yes    Alcohol/week: 6.0 standard drinks    Types: 6 Cans of beer per week    Comment: beer occas   Drug use: No    Frequency: 2.0 times per week    Types: Marijuana    Comment: smokes daily for thirty years. currently takes one puff   Sexual activity: Yes    Partners: Female    Birth control/protection: Condom  Other Topics Concern   Not on file  Social History Narrative   Not on file   Social Determinants of Health   Financial Resource Strain: Not on file  Food Insecurity: Not on file  Transportation Needs: Not on file  Physical Activity: Not on file  Stress: Not on file  Social Connections: Not on file   Additional Social History:                         Sleep: Fair  Appetite:  Good  Current Medications: Current Outpatient Medications  Medication Sig Dispense Refill   venlafaxine XR (EFFEXOR XR) 37.5 MG 24 hr capsule 1 qam 30 capsule 4    acetaminophen (TYLENOL) 500 MG tablet Take 1,000 mg by mouth every 6 (six) hours as needed for mild pain.     Ca Carbonate-Mag Hydroxide (ROLAIDS PO) Take 2 tablets by mouth daily as needed (heartburn).     Carboxymethylcellul-Glycerin (LUBRICATING EYE DROPS OP) Place 1 drop into both eyes daily as needed (dry eyes).     celecoxib (CELEBREX) 200 MG capsule Take 200 mg by mouth daily.     diphenhydrAMINE (BENADRYL) 25 mg capsule Take 25 mg by mouth at bedtime as needed for allergies or sleep.      hydrocortisone cream 1 % Apply 1 application topically daily as needed for itching.     ibuprofen (ADVIL) 200 MG tablet Take 600-800 mg by mouth every 6 (six) hours as needed for headache or moderate pain.     levothyroxine (SYNTHROID, LEVOTHROID) 50 MCG tablet Take 50 mcg by mouth daily before breakfast.      LORazepam (ATIVAN) 1 MG tablet 1 bid   1  PRN 70 tablet 4   metoprolol succinate (TOPROL XL) 50 MG 24 hr tablet Take 1 tablet (50 mg total) by mouth daily. Take with or immediately following a meal. 30 tablet 11   oxymetazoline (AFRIN) 0.05 % nasal spray Place 1-2 sprays into both nostrils 2 (two) times daily as needed for congestion.     traMADol (ULTRAM) 50 MG tablet      valACYclovir (VALTREX) 500 MG tablet Take 500 mg by mouth daily as needed (Herpes Virus).     zolpidem (AMBIEN) 10 MG tablet Take 1 tablet (10 mg total) by mouth at bedtime. 30 tablet 5   No current facility-administered medications for this visit.    Lab Results: No results found for this or any previous visit (from the past 48 hour(s)).  Blood Alcohol level:  No results found for: [facility]  Physical Findings: AIMS:  , ,  ,  ,    CIWA:    COWS:     Musculoskeletal: Strength & Muscle Tone: within normal limits Gait & Station: normal Patient leans: N/A  Psychiatric Specialty Exam: ROS  There were no vitals taken for this visit.There is no height or weight on file to calculate BMI.  General Appearance: Casual  Eye  Contact::  Good  Speech:  Clear and Coherent  Volume:  Normal  Mood:mild depression  Affect:  Congruent  Thought Process:  Coherent  Orientation:  Full (Time, Place, and Person)  Thought Content:  WDL  Suicidal Thoughts:  No  Homicidal Thoughts:  No  Memory:  NA  Judgement:  Good  Insight:  Fair  Psychomotor Activity:  Normal  Concentration:  Good  Recall:  Good  Fund of Knowledge:Good  Language: Fair  Akathisia:  No  Handed:  Right  AIMS (if indicated):     Assets:  Desire for Improvement  ADL's:  Intact  Cognition: WNL  Sleep:      Treatment Plan Summary: 08/29/2020,    At this time this patient's diagnosis is that of dysthymic disorder.  I think he has a number of anxiety symptoms in association with this.  In the distant past he remembers trying Effexor.  Were not sure what dose she took.  Effexor would be the best medicine for her given that it is approved for generalized anxiety disorder and clinical depression.  We will start it in a very low-dose of 37.5 mg.  His second problem is insomnia.  He will continue taking Ambien which does not work well.  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  At this time we will continue taking Ativan 1 twice daily with 1 extra but will not give him more than 70 pills.  He is interested in taking more but we will do that given this man is 70 years of age and is falling.  In fact on his next visit we will talk about reducing the Ativan further.  At this time we will also order an MRI of his head.  This is because of his complaints and it appears obvious when the observation that his left side of his body is weaker than his right.  He also says his vision on the left side seems to be affected and he complains of a headache and pressure on the left side of his head.  Noted also is the fact that he has had a number of falls lately.  Therefore he is having problems with balance  having headaches and seems to have a focal neurological problem at this time.  I do not think he could all be explained by his diagnosis of spinal stenosis.Marland Kitchen

## 2020-08-30 ENCOUNTER — Other Ambulatory Visit (HOSPITAL_COMMUNITY): Payer: Self-pay | Admitting: *Deleted

## 2020-08-30 DIAGNOSIS — R531 Weakness: Secondary | ICD-10-CM

## 2020-09-05 DIAGNOSIS — I10 Essential (primary) hypertension: Secondary | ICD-10-CM | POA: Diagnosis not present

## 2020-09-05 DIAGNOSIS — M48062 Spinal stenosis, lumbar region with neurogenic claudication: Secondary | ICD-10-CM | POA: Diagnosis not present

## 2020-09-18 DIAGNOSIS — F422 Mixed obsessional thoughts and acts: Secondary | ICD-10-CM | POA: Diagnosis not present

## 2020-09-18 DIAGNOSIS — F341 Dysthymic disorder: Secondary | ICD-10-CM | POA: Diagnosis not present

## 2020-09-21 DIAGNOSIS — R293 Abnormal posture: Secondary | ICD-10-CM | POA: Diagnosis not present

## 2020-09-21 DIAGNOSIS — R262 Difficulty in walking, not elsewhere classified: Secondary | ICD-10-CM | POA: Diagnosis not present

## 2020-09-21 DIAGNOSIS — M2569 Stiffness of other specified joint, not elsewhere classified: Secondary | ICD-10-CM | POA: Diagnosis not present

## 2020-09-21 DIAGNOSIS — M6281 Muscle weakness (generalized): Secondary | ICD-10-CM | POA: Diagnosis not present

## 2020-09-21 DIAGNOSIS — Z9181 History of falling: Secondary | ICD-10-CM | POA: Diagnosis not present

## 2020-09-25 DIAGNOSIS — Z9181 History of falling: Secondary | ICD-10-CM | POA: Diagnosis not present

## 2020-09-25 DIAGNOSIS — R293 Abnormal posture: Secondary | ICD-10-CM | POA: Diagnosis not present

## 2020-09-25 DIAGNOSIS — M2569 Stiffness of other specified joint, not elsewhere classified: Secondary | ICD-10-CM | POA: Diagnosis not present

## 2020-09-25 DIAGNOSIS — R262 Difficulty in walking, not elsewhere classified: Secondary | ICD-10-CM | POA: Diagnosis not present

## 2020-09-25 DIAGNOSIS — M6281 Muscle weakness (generalized): Secondary | ICD-10-CM | POA: Diagnosis not present

## 2020-09-26 DIAGNOSIS — I10 Essential (primary) hypertension: Secondary | ICD-10-CM | POA: Diagnosis not present

## 2020-09-27 DIAGNOSIS — M6281 Muscle weakness (generalized): Secondary | ICD-10-CM | POA: Diagnosis not present

## 2020-09-27 DIAGNOSIS — Z9181 History of falling: Secondary | ICD-10-CM | POA: Diagnosis not present

## 2020-09-27 DIAGNOSIS — R293 Abnormal posture: Secondary | ICD-10-CM | POA: Diagnosis not present

## 2020-09-27 DIAGNOSIS — M2569 Stiffness of other specified joint, not elsewhere classified: Secondary | ICD-10-CM | POA: Diagnosis not present

## 2020-09-27 DIAGNOSIS — R262 Difficulty in walking, not elsewhere classified: Secondary | ICD-10-CM | POA: Diagnosis not present

## 2020-09-28 DIAGNOSIS — Z9181 History of falling: Secondary | ICD-10-CM | POA: Diagnosis not present

## 2020-09-28 DIAGNOSIS — R293 Abnormal posture: Secondary | ICD-10-CM | POA: Diagnosis not present

## 2020-09-28 DIAGNOSIS — M6281 Muscle weakness (generalized): Secondary | ICD-10-CM | POA: Diagnosis not present

## 2020-09-28 DIAGNOSIS — R262 Difficulty in walking, not elsewhere classified: Secondary | ICD-10-CM | POA: Diagnosis not present

## 2020-09-28 DIAGNOSIS — M2569 Stiffness of other specified joint, not elsewhere classified: Secondary | ICD-10-CM | POA: Diagnosis not present

## 2020-10-02 DIAGNOSIS — R293 Abnormal posture: Secondary | ICD-10-CM | POA: Diagnosis not present

## 2020-10-02 DIAGNOSIS — M2569 Stiffness of other specified joint, not elsewhere classified: Secondary | ICD-10-CM | POA: Diagnosis not present

## 2020-10-02 DIAGNOSIS — Z9181 History of falling: Secondary | ICD-10-CM | POA: Diagnosis not present

## 2020-10-02 DIAGNOSIS — R262 Difficulty in walking, not elsewhere classified: Secondary | ICD-10-CM | POA: Diagnosis not present

## 2020-10-02 DIAGNOSIS — M6281 Muscle weakness (generalized): Secondary | ICD-10-CM | POA: Diagnosis not present

## 2020-10-03 DIAGNOSIS — R293 Abnormal posture: Secondary | ICD-10-CM | POA: Diagnosis not present

## 2020-10-03 DIAGNOSIS — M6281 Muscle weakness (generalized): Secondary | ICD-10-CM | POA: Diagnosis not present

## 2020-10-03 DIAGNOSIS — R262 Difficulty in walking, not elsewhere classified: Secondary | ICD-10-CM | POA: Diagnosis not present

## 2020-10-03 DIAGNOSIS — Z9181 History of falling: Secondary | ICD-10-CM | POA: Diagnosis not present

## 2020-10-03 DIAGNOSIS — M2569 Stiffness of other specified joint, not elsewhere classified: Secondary | ICD-10-CM | POA: Diagnosis not present

## 2020-10-05 DIAGNOSIS — M2569 Stiffness of other specified joint, not elsewhere classified: Secondary | ICD-10-CM | POA: Diagnosis not present

## 2020-10-05 DIAGNOSIS — R262 Difficulty in walking, not elsewhere classified: Secondary | ICD-10-CM | POA: Diagnosis not present

## 2020-10-05 DIAGNOSIS — R293 Abnormal posture: Secondary | ICD-10-CM | POA: Diagnosis not present

## 2020-10-05 DIAGNOSIS — M6281 Muscle weakness (generalized): Secondary | ICD-10-CM | POA: Diagnosis not present

## 2020-10-05 DIAGNOSIS — Z9181 History of falling: Secondary | ICD-10-CM | POA: Diagnosis not present

## 2020-10-11 DIAGNOSIS — R293 Abnormal posture: Secondary | ICD-10-CM | POA: Diagnosis not present

## 2020-10-11 DIAGNOSIS — R262 Difficulty in walking, not elsewhere classified: Secondary | ICD-10-CM | POA: Diagnosis not present

## 2020-10-11 DIAGNOSIS — M2569 Stiffness of other specified joint, not elsewhere classified: Secondary | ICD-10-CM | POA: Diagnosis not present

## 2020-10-11 DIAGNOSIS — Z9181 History of falling: Secondary | ICD-10-CM | POA: Diagnosis not present

## 2020-10-11 DIAGNOSIS — M6281 Muscle weakness (generalized): Secondary | ICD-10-CM | POA: Diagnosis not present

## 2020-10-18 DIAGNOSIS — Z9181 History of falling: Secondary | ICD-10-CM | POA: Diagnosis not present

## 2020-10-18 DIAGNOSIS — M6281 Muscle weakness (generalized): Secondary | ICD-10-CM | POA: Diagnosis not present

## 2020-10-18 DIAGNOSIS — R293 Abnormal posture: Secondary | ICD-10-CM | POA: Diagnosis not present

## 2020-10-18 DIAGNOSIS — R262 Difficulty in walking, not elsewhere classified: Secondary | ICD-10-CM | POA: Diagnosis not present

## 2020-10-18 DIAGNOSIS — M2569 Stiffness of other specified joint, not elsewhere classified: Secondary | ICD-10-CM | POA: Diagnosis not present

## 2020-10-19 DIAGNOSIS — F422 Mixed obsessional thoughts and acts: Secondary | ICD-10-CM | POA: Diagnosis not present

## 2020-10-19 DIAGNOSIS — F341 Dysthymic disorder: Secondary | ICD-10-CM | POA: Diagnosis not present

## 2020-10-23 DIAGNOSIS — M6281 Muscle weakness (generalized): Secondary | ICD-10-CM | POA: Diagnosis not present

## 2020-10-23 DIAGNOSIS — M2569 Stiffness of other specified joint, not elsewhere classified: Secondary | ICD-10-CM | POA: Diagnosis not present

## 2020-10-23 DIAGNOSIS — Z9181 History of falling: Secondary | ICD-10-CM | POA: Diagnosis not present

## 2020-10-23 DIAGNOSIS — R262 Difficulty in walking, not elsewhere classified: Secondary | ICD-10-CM | POA: Diagnosis not present

## 2020-10-23 DIAGNOSIS — R293 Abnormal posture: Secondary | ICD-10-CM | POA: Diagnosis not present

## 2020-10-25 DIAGNOSIS — M2569 Stiffness of other specified joint, not elsewhere classified: Secondary | ICD-10-CM | POA: Diagnosis not present

## 2020-10-25 DIAGNOSIS — M6281 Muscle weakness (generalized): Secondary | ICD-10-CM | POA: Diagnosis not present

## 2020-10-25 DIAGNOSIS — Z9181 History of falling: Secondary | ICD-10-CM | POA: Diagnosis not present

## 2020-10-25 DIAGNOSIS — R293 Abnormal posture: Secondary | ICD-10-CM | POA: Diagnosis not present

## 2020-10-25 DIAGNOSIS — R262 Difficulty in walking, not elsewhere classified: Secondary | ICD-10-CM | POA: Diagnosis not present

## 2020-10-26 DIAGNOSIS — I10 Essential (primary) hypertension: Secondary | ICD-10-CM | POA: Diagnosis not present

## 2020-11-06 ENCOUNTER — Inpatient Hospital Stay (HOSPITAL_COMMUNITY): Payer: Medicare Other | Attending: Hematology

## 2020-11-06 ENCOUNTER — Other Ambulatory Visit: Payer: Self-pay

## 2020-11-06 DIAGNOSIS — Z8579 Personal history of other malignant neoplasms of lymphoid, hematopoietic and related tissues: Secondary | ICD-10-CM | POA: Diagnosis not present

## 2020-11-06 DIAGNOSIS — Z9221 Personal history of antineoplastic chemotherapy: Secondary | ICD-10-CM | POA: Insufficient documentation

## 2020-11-06 DIAGNOSIS — C8330 Diffuse large B-cell lymphoma, unspecified site: Secondary | ICD-10-CM

## 2020-11-06 LAB — COMPREHENSIVE METABOLIC PANEL
ALT: 25 U/L (ref 0–44)
AST: 29 U/L (ref 15–41)
Albumin: 4.3 g/dL (ref 3.5–5.0)
Alkaline Phosphatase: 46 U/L (ref 38–126)
Anion gap: 6 (ref 5–15)
BUN: 16 mg/dL (ref 8–23)
CO2: 24 mmol/L (ref 22–32)
Calcium: 8.8 mg/dL — ABNORMAL LOW (ref 8.9–10.3)
Chloride: 104 mmol/L (ref 98–111)
Creatinine, Ser: 0.82 mg/dL (ref 0.61–1.24)
GFR, Estimated: >60 mL/min (ref >60)
Glucose, Bld: 120 mg/dL — ABNORMAL HIGH (ref 70–99)
Potassium: 4 mmol/L (ref 3.5–5.1)
Sodium: 134 mmol/L — ABNORMAL LOW (ref 135–145)
Total Bilirubin: 1.2 mg/dL (ref 0.3–1.2)
Total Protein: 7.4 g/dL (ref 6.5–8.1)

## 2020-11-06 LAB — CBC WITH DIFFERENTIAL/PLATELET
Abs Immature Granulocytes: 0.04 10*3/uL (ref 0.00–0.07)
Basophils Absolute: 0.1 10*3/uL (ref 0.0–0.1)
Basophils Relative: 2 %
Eosinophils Absolute: 0.4 10*3/uL (ref 0.0–0.5)
Eosinophils Relative: 6 %
HCT: 42.6 % (ref 39.0–52.0)
Hemoglobin: 15.1 g/dL (ref 13.0–17.0)
Immature Granulocytes: 1 %
Lymphocytes Relative: 32 %
Lymphs Abs: 2.1 10*3/uL (ref 0.7–4.0)
MCH: 33.8 pg (ref 26.0–34.0)
MCHC: 35.4 g/dL (ref 30.0–36.0)
MCV: 95.3 fL (ref 80.0–100.0)
Monocytes Absolute: 0.6 10*3/uL (ref 0.1–1.0)
Monocytes Relative: 9 %
Neutro Abs: 3.3 10*3/uL (ref 1.7–7.7)
Neutrophils Relative %: 50 %
Platelets: 228 10*3/uL (ref 150–400)
RBC: 4.47 MIL/uL (ref 4.22–5.81)
RDW: 12.7 % (ref 11.5–15.5)
WBC: 6.6 10*3/uL (ref 4.0–10.5)
nRBC: 0 % (ref 0.0–0.2)

## 2020-11-06 LAB — LACTATE DEHYDROGENASE: LDH: 191 U/L (ref 98–192)

## 2020-11-07 DIAGNOSIS — Z6825 Body mass index (BMI) 25.0-25.9, adult: Secondary | ICD-10-CM | POA: Diagnosis not present

## 2020-11-07 DIAGNOSIS — R293 Abnormal posture: Secondary | ICD-10-CM | POA: Diagnosis not present

## 2020-11-07 DIAGNOSIS — M2569 Stiffness of other specified joint, not elsewhere classified: Secondary | ICD-10-CM | POA: Diagnosis not present

## 2020-11-07 DIAGNOSIS — M48061 Spinal stenosis, lumbar region without neurogenic claudication: Secondary | ICD-10-CM | POA: Diagnosis not present

## 2020-11-07 DIAGNOSIS — M6281 Muscle weakness (generalized): Secondary | ICD-10-CM | POA: Diagnosis not present

## 2020-11-07 DIAGNOSIS — I10 Essential (primary) hypertension: Secondary | ICD-10-CM | POA: Diagnosis not present

## 2020-11-07 DIAGNOSIS — R262 Difficulty in walking, not elsewhere classified: Secondary | ICD-10-CM | POA: Diagnosis not present

## 2020-11-07 DIAGNOSIS — Z299 Encounter for prophylactic measures, unspecified: Secondary | ICD-10-CM | POA: Diagnosis not present

## 2020-11-07 DIAGNOSIS — E039 Hypothyroidism, unspecified: Secondary | ICD-10-CM | POA: Diagnosis not present

## 2020-11-07 DIAGNOSIS — Z23 Encounter for immunization: Secondary | ICD-10-CM | POA: Diagnosis not present

## 2020-11-07 DIAGNOSIS — Z9181 History of falling: Secondary | ICD-10-CM | POA: Diagnosis not present

## 2020-11-09 DIAGNOSIS — F341 Dysthymic disorder: Secondary | ICD-10-CM | POA: Diagnosis not present

## 2020-11-09 DIAGNOSIS — F422 Mixed obsessional thoughts and acts: Secondary | ICD-10-CM | POA: Diagnosis not present

## 2020-11-12 NOTE — Progress Notes (Signed)
[name], [place] [address]   CLINIC:  Medical Oncology/Hematology  PCP:  [name], [place] [address] [phone]   REASON FOR VISIT:  Follow-up for  DLBCL  PRIOR THERAPY: Chemotherapy with 6 cycles at [facility] completed on 10/27/2013   NGS Results: not done  CURRENT THERAPY: surveillance  BRIEF ONCOLOGIC HISTORY:  Oncology History  DLBCL (diffuse large B cell lymphoma) ([place])  07/07/2013 Initial Biopsy   CT guided right iliac bone marrow aspiration and core biopsy.   07/07/2013 Pathology Results   Bone Marrow Flow Cytometry - PREDOMINANCE OF T-LYMPHOCYTES WITH NONSPECIFIC REVERSAL OF THE CD4:CD8 RATIO. - NO MONOCLONAL B-CELL POPULATION IDENTIFIED. Bone Marrow, Aspirate,Biopsy, and Clot, right iliac - SLIGHTLY HYPERCELLULAR BONE MARROW FOR AGE WITH TRILINEAGE HEMATOPOIESIS. - NEGATIVE FOR LYMPHOMA - SEE COMMENT. PERIPHERAL BLOOD: - NORMOCYTIC-NORMOCHROMIC ANEMIA. - NEUTROPHILIC LEFT SHIFT.   6/75/9163 - 10/27/2013 Chemotherapy   R-CHOP x 6 cycles by Dr. [name] at [facility]    09/01/2013 PET scan   Minimal metabolic activity associated with the splenic lesion. Otherwise complete response to chemotherapy. No residual hypermetabolic lymph nodes in the skullbase to thigh PET scan. Marked reduction in  lingular consolidation   09/01/2013 PET scan   1. Minimal metabolic activity associated with the splenic lesion. Otherwise complete response to chemotherapy. 1. No residual hypermetabolic lymph nodes in the skullbase to thigh PET scan.   2. Marked reduction in  lingular consolidation   11/28/2013 PET scan   1. Today's study is very similar to prior study from 09/01/2013. Specifically, the ill-defined low-attenuation lesion in the anterior aspect of the spleen is slightly smaller, currently measuring 2.1 x 2.3 cm, and continues to demonstrate some very low-level metabolic activity (SUVmax = 2.9). 2. There is  a persistent mass-like opacity in the posterior aspect of the left upper lobe, which also appears slightly smaller than the prior examination. This too demonstrates some low-level metabolic activity (SUVmax = 2.7). While this may simply represent a resolving post infectious or inflammatory scar, if there is any clinical concern that the opacity on the original study from 06/09/2013 that this was in fact a lymphomatous infiltrate, this could represent a residual focus of disease. 3. No new foci of disease noted in the neck, chest, abdomen or pelvis. 4. Atherosclerosis, including left main and 3 vessel coronary artery disease. Please note that although the presence of coronary artery calcium documents the presence of coronary artery disease, the severity of this disease and any potential stenosis cannot be assessed on this non-gated CT examination. Assessment for potential risk factor modification, dietary therapy or pharmacologic therapy may be warranted, if clinically indicated. 5. Mild cardiomegaly.   07/17/2015 Initial Diagnosis   DLBCL (diffuse large B cell lymphoma) ([place])   10/17/2015 Imaging   CT CAP- Stable exam. No evidence for lymphadenopathy in the chest, abdomen, or pelvis. No new or progressive interval findings. 2. 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     CANCER STAGING: Cancer Staging No matching staging information was found for the patient.  INTERVAL HISTORY:  [name], a 70 y.o. male, returns for routine follow-up of his DLBCL. [name] was last seen on 11/07/2019.   Today he reports feeling well. He reports mild night sweats, fatigue, and easy bruising.  REVIEW OF SYSTEMS:  Review of Systems  Constitutional:  Positive for fatigue (40%). Negative for appetite change.  Genitourinary:  Positive for frequency.  Neurological:  Positive for dizziness and headaches.  Hematological:  Bruises/bleeds easily.   Psychiatric/Behavioral:  Positive for depression and sleep disturbance. The patient is nervous/anxious.   All other systems reviewed and are negative.  PAST MEDICAL/SURGICAL HISTORY:  Past Medical History:  Diagnosis Date   Anemia    Aneurysm, aorta, thoracic (HCC)    Anxiety    Back pain    Depression    DLBCL (diffuse large B cell lymphoma) ([place]) 07/17/2015   Environmental allergies    History of blood transfusion    History of bronchitis    History of chemotherapy    Hypertension    Hypothyroidism    Nocturia    Peripheral vascular disease (HCC)    Urinary frequency    Past Surgical History:  Procedure Laterality Date   BONE MARROW BIOPSY     COLONOSCOPY N/A 08/12/2018   Procedure: COLONOSCOPY;  Surgeon: [name], MD;  [facility];  Service: Endoscopy;  Laterality: N/A;  1030   HERNIA REPAIR     umblical times 2; inguinal (left)    IR REMOVAL TUN ACCESS W/ PORT W/O FL MOD SED  07/08/2016   POLYPECTOMY  08/12/2018   Procedure: POLYPECTOMY;  Surgeon: [name], MD;  [facility];  Service: Endoscopy;;  colon   port a cath placement     RHINOPLASTY      SOCIAL HISTORY:  Social History   Socioeconomic History   Marital status: Divorced    Spouse name: Not on file   Number of children: Not on file   Years of education: Not on file   Highest education level: Not on file  Occupational History   Not on file  Tobacco Use   Smoking status: Never   Smokeless tobacco: Never  Vaping Use   Vaping Use: Never used  Substance and Sexual Activity   Alcohol use: Yes    Alcohol/week: 6.0 standard drinks    Types: 6 Cans of beer per week    Comment: beer occas   Drug use: No    Frequency: 2.0 times per week    Types: Marijuana    Comment: smokes daily for thirty years. currently takes one puff   Sexual activity: Yes    Partners: Female    Birth control/protection: Condom  Other Topics Concern   Not on file  Social History Narrative    Not on file   Social Determinants of Health   Financial Resource Strain: Not on file  Food Insecurity: Not on file  Transportation Needs: Not on file  Physical Activity: Not on file  Stress: Not on file  Social Connections: Not on file  Intimate Partner Violence: Not on file    FAMILY HISTORY:  Family History  Problem Relation Age of Onset   Alcohol abuse Father    Depression Sister    Anxiety disorder Sister    Depression Sister    Anxiety disorder Sister     CURRENT MEDICATIONS:  Current Outpatient Medications  Medication Sig Dispense Refill   acetaminophen (TYLENOL) 500 MG tablet Take 1,000 mg by mouth every 6 (six) hours as needed for mild pain.     Ca Carbonate-Mag Hydroxide (ROLAIDS PO) Take 2 tablets by mouth daily as needed (heartburn).     Carboxymethylcellul-Glycerin (LUBRICATING EYE DROPS OP) Place 1 drop into both eyes daily as needed (dry eyes).     celecoxib (CELEBREX) 200 MG capsule Take 200 mg by mouth daily.  diphenhydrAMINE (BENADRYL) 25 mg capsule Take 25 mg by mouth at bedtime as needed for allergies or sleep.      hydrocortisone cream 1 % Apply 1 application topically daily as needed for itching.     ibuprofen (ADVIL) 200 MG tablet Take 600-800 mg by mouth every 6 (six) hours as needed for headache or moderate pain.     levothyroxine (SYNTHROID, LEVOTHROID) 50 MCG tablet Take 50 mcg by mouth daily before breakfast.      LORazepam (ATIVAN) 1 MG tablet 1 bid   1  PRN 70 tablet 4   metoprolol succinate (TOPROL XL) 50 MG 24 hr tablet Take 1 tablet (50 mg total) by mouth daily. Take with or immediately following a meal. 30 tablet 11   oxymetazoline (AFRIN) 0.05 % nasal spray Place 1-2 sprays into both nostrils 2 (two) times daily as needed for congestion.     traMADol (ULTRAM) 50 MG tablet      valACYclovir (VALTREX) 500 MG tablet Take 500 mg by mouth daily as needed (Herpes Virus).     venlafaxine XR (EFFEXOR XR) 37.5 MG 24 hr capsule 1 qam 30 capsule 4    zolpidem (AMBIEN) 10 MG tablet Take 1 tablet (10 mg total) by mouth at bedtime. 30 tablet 5   No current facility-administered medications for this visit.    ALLERGIES:  Allergies  Allergen Reactions   Flomax [Tamsulosin Hcl] Swelling    congestion     PHYSICAL EXAM:  Performance status (ECOG): 1 - Symptomatic but completely ambulatory  There were no vitals filed for this visit. Wt Readings from Last 3 Encounters:  08/01/20 185 lb (83.9 kg)  11/07/19 188 lb 1.6 oz (85.3 kg)  07/12/19 193 lb 6.4 oz (87.7 kg)   Physical Exam Vitals reviewed.  Constitutional:      Appearance: Normal appearance.  Cardiovascular:     Rate and Rhythm: Normal rate and regular rhythm.     Pulses: Normal pulses.     Heart sounds: Normal heart sounds.  Pulmonary:     Effort: Pulmonary effort is normal.     Breath sounds: Normal breath sounds.  Abdominal:     Palpations: Abdomen is soft. There is no hepatomegaly, splenomegaly or mass.     Tenderness: There is no abdominal tenderness.  Musculoskeletal:     Right lower leg: No edema.     Left lower leg: No edema.  Lymphadenopathy:     Cervical: No cervical adenopathy.     Right cervical: No superficial cervical adenopathy.    Left cervical: No superficial cervical adenopathy.     Upper Body:     Right upper body: No supraclavicular, axillary or pectoral adenopathy.     Left upper body: No supraclavicular, axillary or pectoral adenopathy.  Neurological:     General: No focal deficit present.     Mental Status: He is alert and oriented to person, place, and time.  Psychiatric:        Mood and Affect: Mood normal.        Behavior: Behavior normal.     LABORATORY DATA:  I have reviewed the labs as listed.  CBC Latest Ref Rng & Units 11/06/2020 11/03/2019 10/13/2018  WBC 4.0 - 10.5 K/uL 6.6 9.1 5.6  Hemoglobin 13.0 - 17.0 g/dL 15.1 15.0 14.7  Hematocrit 39.0 - 52.0 % 42.6 41.9 41.8  Platelets 150 - 400 K/uL 228 262 196   CMP Latest Ref Rng  & Units 11/06/2020 11/03/2019 10/13/2018  Glucose 70 -  99 mg/dL 120(H) 103(H) 104(H)  BUN 8 - 23 mg/dL _0 Creatinine 0.61 - 1.24 mg/dL 0.82 0.75 0.81  Sodium 135 - 145 mmol/L 134(L) 133(L) 138  Potassium 3.5 - 5.1 mmol/L 4.0 4.1 4.0  Chloride 98 - 111 mmol/L 104 100 106  CO2 22 - 32 mmol/L _1 Calcium 8.9 - 10.3 mg/dL 8.8(L) 9.2 8.9  Total Protein 6.5 - 8.1 g/dL 7.4 7.3 7.0  Total Bilirubin 0.3 - 1.2 mg/dL 1.2 1.2 1.2  Alkaline Phos 38 - 126 U/L 46 34(L) 36(L)  AST 15 - 41 U/L _2 ALT 0 - 44 U/L _3 DIAGNOSTIC IMAGING:  I have independently reviewed the scans and discussed with the patient. No results found.   ASSESSMENT:  1.  Stage IVB DLBCL: - Presentation with night sweats, 40 pound weight loss, nausea. -Treated with 6 cycles of chemotherapy at [facility], completed on 10/27/2013.   PLAN:  1.  Stage IVB DLBCL: - He has mild night sweats which are stable. - He did not have any weight loss. - Physical examination today did not reveal any palpable adenopathy or hepatosplenomegaly. - Reviewed labs from 11/06/2020 which showed normal LFTs and CBC.  LDH was 191. - Recent CT scan of the chest on 07/27/2020 did not show any lymphadenopathy. - No evidence of recurrence at this time. - RTC 1 year for follow-up.   Orders placed this encounter:  No orders of the defined types were placed in this encounter.    Derek Jack, MD Douglas Bryant [phone]   I, Thana Ates, am acting as a scribe for Dr. Derek Jack.  I, Derek Jack MD, have reviewed the above documentation for accuracy and completeness, and I agree with the above.

## 2020-11-13 ENCOUNTER — Inpatient Hospital Stay (HOSPITAL_BASED_OUTPATIENT_CLINIC_OR_DEPARTMENT_OTHER): Payer: Medicare Other | Admitting: Hematology

## 2020-11-13 ENCOUNTER — Other Ambulatory Visit: Payer: Self-pay

## 2020-11-13 VITALS — BP 144/82 | HR 60 | Temp 96.9°F | Resp 18 | Wt 187.4 lb

## 2020-11-13 DIAGNOSIS — C8330 Diffuse large B-cell lymphoma, unspecified site: Secondary | ICD-10-CM

## 2020-11-13 DIAGNOSIS — Z8579 Personal history of other malignant neoplasms of lymphoid, hematopoietic and related tissues: Secondary | ICD-10-CM | POA: Diagnosis not present

## 2020-11-13 DIAGNOSIS — Z9221 Personal history of antineoplastic chemotherapy: Secondary | ICD-10-CM | POA: Diagnosis not present

## 2020-11-13 NOTE — Patient Instructions (Signed)
[facility]  Discharge Instructions: Thank you for choosing [facility] to provide your oncology and hematology care.  If you have a lab appointment with the Cancer Center, please come in thru the Main Entrance and check in at the main information desk.  Wear comfortable clothing and clothing appropriate for easy access to any Portacath or PICC line.   We strive to give you quality time with your provider. You may need to reschedule your appointment if you arrive late (15 or more minutes).  Arriving late affects you and other patients whose appointments are after yours.  Also, if you miss three or more appointments without notifying the office, you may be dismissed from the clinic at the provider's discretion.      For prescription refill requests, have your pharmacy contact our office and allow 72 hours for refills to be completed.        To help prevent nausea and vomiting after your treatment, we encourage you to take your nausea medication as directed.  BELOW ARE SYMPTOMS THAT SHOULD BE REPORTED IMMEDIATELY: *FEVER GREATER THAN 100.4 F (38 C) OR HIGHER *CHILLS OR SWEATING *NAUSEA AND VOMITING THAT IS NOT CONTROLLED WITH YOUR NAUSEA MEDICATION *UNUSUAL SHORTNESS OF BREATH *UNUSUAL BRUISING OR BLEEDING *URINARY PROBLEMS (pain or burning when urinating, or frequent urination) *BOWEL PROBLEMS (unusual diarrhea, constipation, pain near the anus) TENDERNESS IN MOUTH AND THROAT WITH OR WITHOUT PRESENCE OF ULCERS (sore throat, sores in mouth, or a toothache) UNUSUAL RASH, SWELLING OR PAIN  UNUSUAL VAGINAL DISCHARGE OR ITCHING   Items with * indicate a potential emergency and should be followed up as soon as possible or go to the Emergency Department if any problems should occur.  Please show the CHEMOTHERAPY ALERT CARD or IMMUNOTHERAPY ALERT CARD at check-in to the Emergency Department and triage nurse.  Should you have questions after your visit or need to cancel  or reschedule your appointment, please contact [facility] [phone]  and follow the prompts.  Office hours are 8:00 a.m. to 4:30 p.m. Monday - Friday. Please note that voicemails left after 4:00 p.m. may not be returned until the following business day.  We are closed weekends and major holidays. You have access to a nurse at all times for urgent questions. Please call the main number to the clinic [phone] and follow the prompts.  For any non-urgent questions, you may also contact your provider using MyChart. We now offer e-Visits for anyone 18 and older to request care online for non-urgent symptoms. For details visit [email or web address].   Also download the MyChart app! Go to the app store, search "MyChart", open the app, select [facility], and log in with your MyChart username and password.  Due to Covid, a mask is required upon entering the hospital/clinic. If you do not have a mask, one will be given to you upon arrival. For doctor visits, patients may have 1 support person aged 18 or older with them. For treatment visits, patients cannot have anyone with them due to current Covid guidelines and our immunocompromised population.  

## 2020-11-14 DIAGNOSIS — R293 Abnormal posture: Secondary | ICD-10-CM | POA: Diagnosis not present

## 2020-11-14 DIAGNOSIS — R262 Difficulty in walking, not elsewhere classified: Secondary | ICD-10-CM | POA: Diagnosis not present

## 2020-11-14 DIAGNOSIS — Z9181 History of falling: Secondary | ICD-10-CM | POA: Diagnosis not present

## 2020-11-14 DIAGNOSIS — M6281 Muscle weakness (generalized): Secondary | ICD-10-CM | POA: Diagnosis not present

## 2020-11-14 DIAGNOSIS — M2569 Stiffness of other specified joint, not elsewhere classified: Secondary | ICD-10-CM | POA: Diagnosis not present

## 2020-11-26 DIAGNOSIS — I10 Essential (primary) hypertension: Secondary | ICD-10-CM | POA: Diagnosis not present

## 2020-12-05 ENCOUNTER — Telehealth (HOSPITAL_BASED_OUTPATIENT_CLINIC_OR_DEPARTMENT_OTHER): Payer: Medicare Other | Admitting: Psychiatry

## 2020-12-05 ENCOUNTER — Other Ambulatory Visit: Payer: Self-pay

## 2020-12-05 VITALS — BP 165/100 | HR 65 | Temp 98.2°F | Resp 18 | Ht 73.0 in | Wt 188.4 lb

## 2020-12-05 DIAGNOSIS — F341 Dysthymic disorder: Secondary | ICD-10-CM

## 2020-12-05 MED ORDER — BUSPIRONE HCL 10 MG PO TABS: ORAL_TABLET | ORAL | 3 refills | Status: DC

## 2020-12-05 MED ORDER — LORAZEPAM 1 MG PO TABS: ORAL_TABLET | ORAL | 4 refills | Status: DC

## 2020-12-05 MED ORDER — ZOLPIDEM TARTRATE 10 MG PO TABS: 10.0000 mg | ORAL_TABLET | Freq: Every day | ORAL | 5 refills | Status: DC

## 2020-12-05 MED ORDER — BUPROPION HCL ER (XL) 150 MG PO TB24
150.0000 mg | ORAL_TABLET | ORAL | 2 refills | Status: DC
Start: 2020-12-05 — End: 2021-04-17

## 2020-12-05 NOTE — Progress Notes (Signed)
[name], male   DOB: [birth year], 70 y.o.   MRN: [id number] [facility] MD Progress Note  12/05/2020 2:36 PM Douglas Bryant  MRN:  [id number] Subjective: Lethargic Principal Problem: Dysthymic disorder  Today the patient is at his baseline.  It turns out the process of getting an MRI of his head did not work out.  He never got hold for it.  But in retrospect I am not clear if necessary.  In a closer evaluation his headaches are chronic and actually are less lately.  He has chronic tinnitus.  He has left leg weakness but this has been present for a long time and saw a neurologist about it in the last year.  They diagnosed spinal stenosis to explain this condition.  Always had a fall before and he does feel imbalance has had no more falls since.  He does feel somewhat dizzy but this seems to be a chronic problem.  On his last visit he had enough symptoms indicating to trial Effexor.  He took it for 1 week and felt side effects of a sensation of being cold so he stopped it.  The patient says he sleeps fairly well.  He has to wake up to urinate but he says that he did not urinate problems sleeping through the night.  His appetite is stable.  He recently saw his oncologist for his lymphoma giving him a good deal of health.  They said he is stable.  The patient describes chronic dysphoria which has been present for a long time.  He described early morning depression indicative of something that might respond to an antidepressant.  Unfortunately the patient is been on multiple antidepressants with no success.  He says remembers taking Wellbutrin but is not sure of the outcome.  Today because of symptoms or depression of reduction in energy which she does say is worse in a lot of concentration it makes good sense to try Wellbutrin again.  The patient also said that he took some BuSpar on a as needed basis and this seemed to help him a lot.  This drug is not given on a as needed basis but the patient  thinks it is helpful and therefore practices.  He states his use of marijuana is continuous a small amount.  He drinks no alcohol and has never been psychotic.  Importantly he is in therapy with Lauretta Chester and has an appointment tomorrow.  Noted also is the patient recently has broken up with his girlfriend 3 or 4 months ago.  I am not clear when he thinks about that.  In general the patient describes worsening weakness poor motivation and lack of energy.   Past Surgical History:  Procedure Laterality Date   BONE MARROW BIOPSY     COLONOSCOPY N/A 08/12/2018   Procedure: COLONOSCOPY;  Surgeon: [name], MD;  [facility];  Service: Endoscopy;  Laterality: N/A;  1030   HERNIA REPAIR     umblical times 2; inguinal (left)    IR REMOVAL TUN ACCESS W/ PORT W/O FL MOD SED  07/08/2016   POLYPECTOMY  08/12/2018   Procedure: POLYPECTOMY;  Surgeon: [name], MD;  [facility];  Service: Endoscopy;;  colon   port a cath placement     RHINOPLASTY     Family History:  Family History  Problem Relation Age of Onset   Alcohol abuse Father    Depression Sister    Anxiety disorder Sister  Depression Sister    Anxiety disorder Sister    Family Psychiatric  History:  Social History:  Social History   Substance and Sexual Activity  Alcohol Use Yes   Alcohol/week: 6.0 standard drinks   Types: 6 Cans of beer per week   Comment: beer occas     Social History   Substance and Sexual Activity  Drug Use No   Frequency: 2.0 times per week   Types: Marijuana   Comment: smokes daily for thirty years. currently takes one puff    Social History   Socioeconomic History   Marital status: Divorced    Spouse name: Not on file   Number of children: Not on file   Years of education: Not on file   Highest education level: Not on file  Occupational History   Not on file  Tobacco Use   Smoking status: Never   Smokeless tobacco: Never  Vaping Use   Vaping Use: Never  used  Substance and Sexual Activity   Alcohol use: Yes    Alcohol/week: 6.0 standard drinks    Types: 6 Cans of beer per week    Comment: beer occas   Drug use: No    Frequency: 2.0 times per week    Types: Marijuana    Comment: smokes daily for thirty years. currently takes one puff   Sexual activity: Yes    Partners: Female    Birth control/protection: Condom  Other Topics Concern   Not on file  Social History Narrative   Not on file   Social Determinants of Health   Financial Resource Strain: Not on file  Food Insecurity: Not on file  Transportation Needs: Not on file  Physical Activity: Not on file  Stress: Not on file  Social Connections: Not on file   Additional Social History:                         Sleep: Fair  Appetite:  Good  Current Medications: Current Outpatient Medications  Medication Sig Dispense Refill   buPROPion (WELLBUTRIN XL) 150 MG 24 hr tablet Take 1 tablet (150 mg total) by mouth every morning. 30 tablet 2   busPIRone (BUSPAR) 10 MG tablet 1 bid  1  qday prn 90 tablet 3   acetaminophen (TYLENOL) 500 MG tablet Take 1,000 mg by mouth every 6 (six) hours as needed for mild pain.     Ca Carbonate-Mag Hydroxide (ROLAIDS PO) Take 2 tablets by mouth daily as needed (heartburn).     Carboxymethylcellul-Glycerin (LUBRICATING EYE DROPS OP) Place 1 drop into both eyes daily as needed (dry eyes).     celecoxib (CELEBREX) 200 MG capsule Take 200 mg by mouth daily.     diphenhydrAMINE (BENADRYL) 25 mg capsule Take 25 mg by mouth at bedtime as needed for allergies or sleep.      hydrochlorothiazide (MICROZIDE) 12.5 MG capsule Take 12.5 mg by mouth daily.     hydrocortisone cream 1 % Apply 1 application topically daily as needed for itching.     ibuprofen (ADVIL) 200 MG tablet Take 600-800 mg by mouth every 6 (six) hours as needed for headache or moderate pain.     levothyroxine (SYNTHROID) 125 MCG tablet Take by mouth.     LORazepam (ATIVAN) 1 MG  tablet 1 bid   1  PRN 70 tablet 4   metoprolol succinate (TOPROL XL) 50 MG 24 hr tablet Take 1 tablet (50 mg total) by mouth daily.  Take with or immediately following a meal. 30 tablet 11   oxymetazoline (AFRIN) 0.05 % nasal spray Place 1-2 sprays into both nostrils 2 (two) times daily as needed for congestion.     traMADol (ULTRAM) 50 MG tablet      valACYclovir (VALTREX) 500 MG tablet Take 500 mg by mouth daily as needed (Herpes Virus).     zolpidem (AMBIEN) 10 MG tablet Take 1 tablet (10 mg total) by mouth at bedtime. 30 tablet 5   No current facility-administered medications for this visit.    Lab Results: No results found for this or any previous visit (from the past 48 hour(s)).  Blood Alcohol level:  No results found for: [facility]  Physical Findings: AIMS:  , ,  ,  ,    CIWA:    COWS:     Musculoskeletal: Strength & Muscle Tone: within normal limits Gait & Station: normal Patient leans: N/A  Psychiatric Specialty Exam: ROS  There were no vitals taken for this visit.There is no height or weight on file to calculate BMI.  General Appearance: Casual  Eye Contact::  Good  Speech:  Clear and Coherent  Volume:  Normal  Mood:mild depression  Affect:  Congruent  Thought Process:  Coherent  Orientation:  Full (Time, Place, and Person)  Thought Content:  WDL  Suicidal Thoughts:  No  Homicidal Thoughts:  No  Memory:  NA  Judgement:  Good  Insight:  Fair  Psychomotor Activity:  Normal  Concentration:  Good  Recall:  Good  Fund of Knowledge:Good  Language: Fair  Akathisia:  No  Handed:  Right  AIMS (if indicated):     Assets:  Desire for Improvement  ADL's:  Intact  Cognition: WNL  Sleep:      Treatment Plan Summary: 12/05/2020,    At this time the patient's problem seems to 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  We will go ahead and discontinue his Effexor and begin him on Wellbutrin 150 mg XL in the morning.  Hopefully this will help his mood, increase his energy and  help him with concentration.  His second problem is an adjustment disorder with an anxious mood state.  At this time we will continue a fixed dose of Ativan 1 mg twice daily with 1 extra as needed.  We will now start BuSpar 10 mg twice daily and also give him as needed as well.  He will continue in one-to-one therapy with Denice Bors.  At this time we will not attempt to get an MRI of his head.  This patient has no history of seizures.  Return to see me in about 2 to 3 months.Marland Kitchen

## 2020-12-06 DIAGNOSIS — F341 Dysthymic disorder: Secondary | ICD-10-CM | POA: Diagnosis not present

## 2020-12-06 DIAGNOSIS — F422 Mixed obsessional thoughts and acts: Secondary | ICD-10-CM | POA: Diagnosis not present

## 2020-12-26 DIAGNOSIS — I10 Essential (primary) hypertension: Secondary | ICD-10-CM | POA: Diagnosis not present

## 2021-01-09 DIAGNOSIS — F422 Mixed obsessional thoughts and acts: Secondary | ICD-10-CM | POA: Diagnosis not present

## 2021-01-09 DIAGNOSIS — F341 Dysthymic disorder: Secondary | ICD-10-CM | POA: Diagnosis not present

## 2021-01-09 DIAGNOSIS — Z23 Encounter for immunization: Secondary | ICD-10-CM | POA: Diagnosis not present

## 2021-01-24 DIAGNOSIS — S0081XA Abrasion of other part of head, initial encounter: Secondary | ICD-10-CM | POA: Diagnosis not present

## 2021-01-25 DIAGNOSIS — I10 Essential (primary) hypertension: Secondary | ICD-10-CM | POA: Diagnosis not present

## 2021-02-06 DIAGNOSIS — F341 Dysthymic disorder: Secondary | ICD-10-CM | POA: Diagnosis not present

## 2021-02-06 DIAGNOSIS — F422 Mixed obsessional thoughts and acts: Secondary | ICD-10-CM | POA: Diagnosis not present

## 2021-02-07 DIAGNOSIS — Z87891 Personal history of nicotine dependence: Secondary | ICD-10-CM | POA: Diagnosis not present

## 2021-02-07 DIAGNOSIS — I700 Atherosclerosis of aorta: Secondary | ICD-10-CM | POA: Diagnosis not present

## 2021-02-07 DIAGNOSIS — Z299 Encounter for prophylactic measures, unspecified: Secondary | ICD-10-CM | POA: Diagnosis not present

## 2021-02-07 DIAGNOSIS — Z6825 Body mass index (BMI) 25.0-25.9, adult: Secondary | ICD-10-CM | POA: Diagnosis not present

## 2021-02-07 DIAGNOSIS — E039 Hypothyroidism, unspecified: Secondary | ICD-10-CM | POA: Diagnosis not present

## 2021-02-07 DIAGNOSIS — C8590 Non-Hodgkin lymphoma, unspecified, unspecified site: Secondary | ICD-10-CM | POA: Diagnosis not present

## 2021-02-07 DIAGNOSIS — I10 Essential (primary) hypertension: Secondary | ICD-10-CM | POA: Diagnosis not present

## 2021-02-24 DIAGNOSIS — I10 Essential (primary) hypertension: Secondary | ICD-10-CM | POA: Diagnosis not present

## 2021-02-26 ENCOUNTER — Ambulatory Visit (HOSPITAL_BASED_OUTPATIENT_CLINIC_OR_DEPARTMENT_OTHER): Payer: Medicare Other | Admitting: Psychiatry

## 2021-02-26 ENCOUNTER — Other Ambulatory Visit: Payer: Self-pay

## 2021-02-26 DIAGNOSIS — F341 Dysthymic disorder: Secondary | ICD-10-CM | POA: Diagnosis not present

## 2021-02-26 NOTE — Progress Notes (Signed)
[name], male   DOB: [birth year], 71 y.o.   MRN: [id number] [facility] MD Progress Note  02/26/2021 1:34 PM Douglas Bryant  MRN:  [id number] Subjective: Lethargic Principal Problem: Dysthymic disorder   Today the patient is at his baseline.  The good news is he is in physical therapy and seems to like it.  He did not for his spinal stenosis.  He is really not had any falls since.  His legs are weak.  His pain is minimal.  The patient on his own decided after taking Wellbutrin for 1 week stop it because he thought he was getting some GI symptoms and the feeling of being cold.  I think he realizes these are unlikely symptoms and he is agreed today to restart his Wellbutrin at the relatively low dose of 150 mg XL.  He also for some reason did not restart his BuSpar.  Today he agrees to start both of them back on the BuSpar 10 mg twice daily.  He will continue taking Ativan 1 mg twice daily with 1 extra as needed.  This had physical therapy the good news is he is engaged in therapy with Mr. Olga Coaster.  The patient's energy level as might be expected is still low but his concentration in my view is better.  He is read a book since I have seen him and is on his computer via his cell phone often.  He likes music although he seems less motivated to play the guitar.  Little also has really changed in his life.  But I do believe he has more of a social life that he is allowing to be shared with me.  He has not gotten back with his girlfriend as I think that is over.  The patient had a long discussion with me about his STD.  He limits him from getting into new relationships.  Just recently he was engaging with someone an old friend and he had a sense that it would go something sexual.  But I suppose that he shared with them about his STD and thinks it really cool down since.  The patient is perplexed about what he is to do in future relationships.   Past Surgical History:  Procedure Laterality Date    BONE MARROW BIOPSY     COLONOSCOPY N/A 08/12/2018   Procedure: COLONOSCOPY;  Surgeon: Malissa Hippo, MD;  [facility];  Service: Endoscopy;  Laterality: N/A;  1030   HERNIA REPAIR     umblical times 2; inguinal (left)    IR REMOVAL TUN ACCESS W/ PORT W/O FL MOD SED  07/08/2016   POLYPECTOMY  08/12/2018   Procedure: POLYPECTOMY;  Surgeon: Malissa Hippo, MD;  [facility];  Service: Endoscopy;;  colon   port a cath placement     RHINOPLASTY     Family History:  Family History  Problem Relation Age of Onset   Alcohol abuse Father    Depression Sister    Anxiety disorder Sister    Depression Sister    Anxiety disorder Sister    Family Psychiatric  History:  Social History:  Social History   Substance and Sexual Activity  Alcohol Use Yes   Alcohol/week: 6.0 standard drinks   Types: 6 Cans of beer per week   Comment: beer occas     Social History   Substance and Sexual Activity  Drug Use No   Frequency: 2.0 times per week  Types: Marijuana   Comment: smokes daily for thirty years. currently takes one puff    Social History   Socioeconomic History   Marital status: Divorced    Spouse name: Not on file   Number of children: Not on file   Years of education: Not on file   Highest education level: Not on file  Occupational History   Not on file  Tobacco Use   Smoking status: Never   Smokeless tobacco: Never  Vaping Use   Vaping Use: Never used  Substance and Sexual Activity   Alcohol use: Yes    Alcohol/week: 6.0 standard drinks    Types: 6 Cans of beer per week    Comment: beer occas   Drug use: No    Frequency: 2.0 times per week    Types: Marijuana    Comment: smokes daily for thirty years. currently takes one puff   Sexual activity: Yes    Partners: Female    Birth control/protection: Condom  Other Topics Concern   Not on file  Social History Narrative   Not on file   Social Determinants of Health   Financial Resource  Strain: Not on file  Food Insecurity: Not on file  Transportation Needs: Not on file  Physical Activity: Not on file  Stress: Not on file  Social Connections: Not on file   Additional Social History:                         Sleep: Fair  Appetite:  Good  Current Medications: Current Outpatient Medications  Medication Sig Dispense Refill   acetaminophen (TYLENOL) 500 MG tablet Take 1,000 mg by mouth every 6 (six) hours as needed for mild pain.     buPROPion (WELLBUTRIN XL) 150 MG 24 hr tablet Take 1 tablet (150 mg total) by mouth every morning. 30 tablet 2   busPIRone (BUSPAR) 10 MG tablet 1 bid  1  qday prn 90 tablet 3   Ca Carbonate-Mag Hydroxide (ROLAIDS PO) Take 2 tablets by mouth daily as needed (heartburn).     Carboxymethylcellul-Glycerin (LUBRICATING EYE DROPS OP) Place 1 drop into both eyes daily as needed (dry eyes).     celecoxib (CELEBREX) 200 MG capsule Take 200 mg by mouth daily.     diphenhydrAMINE (BENADRYL) 25 mg capsule Take 25 mg by mouth at bedtime as needed for allergies or sleep.      hydrochlorothiazide (MICROZIDE) 12.5 MG capsule Take 12.5 mg by mouth daily.     hydrocortisone cream 1 % Apply 1 application topically daily as needed for itching.     ibuprofen (ADVIL) 200 MG tablet Take 600-800 mg by mouth every 6 (six) hours as needed for headache or moderate pain.     levothyroxine (SYNTHROID) 125 MCG tablet Take by mouth.     LORazepam (ATIVAN) 1 MG tablet 1 bid   1  PRN 70 tablet 4   metoprolol succinate (TOPROL XL) 50 MG 24 hr tablet Take 1 tablet (50 mg total) by mouth daily. Take with or immediately following a meal. 30 tablet 11   oxymetazoline (AFRIN) 0.05 % nasal spray Place 1-2 sprays into both nostrils 2 (two) times daily as needed for congestion.     traMADol (ULTRAM) 50 MG tablet      valACYclovir (VALTREX) 500 MG tablet Take 500 mg by mouth daily as needed (Herpes Virus).     zolpidem (AMBIEN) 10 MG tablet Take 1 tablet (10 mg total) by  mouth at bedtime. 30 tablet 5   No current facility-administered medications for this visit.    Lab Results: No results found for this or any previous visit (from the past 48 hour(s)).  Blood Alcohol level:  No results found for: [facility]  Physical Findings: AIMS:  , ,  ,  ,    CIWA:    COWS:     Musculoskeletal: Strength & Muscle Tone: within normal limits Gait & Station: normal Patient leans: N/A  Psychiatric Specialty Exam: ROS  There were no vitals taken for this visit.There is no height or weight on file to calculate BMI.  General Appearance: Casual  Eye Contact::  Good  Speech:  Clear and Coherent  Volume:  Normal  Mood:mild depression  Affect:  Congruent  Thought Process:  Coherent  Orientation:  Full (Time, Place, and Person)  Thought Content:  WDL  Suicidal Thoughts:  No  Homicidal Thoughts:  No  Memory:  NA  Judgement:  Good  Insight:  Fair  Psychomotor Activity:  Normal  Concentration:  Good  Recall:  Good  Fund of Knowledge:Good  Language: Fair  Akathisia:  No  Handed:  Right  AIMS (if indicated):     Assets:  Desire for Improvement  ADL's:  Intact  Cognition: WNL  Sleep:      Treatment Plan Summary: 02/26/2021,   This patient's first problem is that of major depression versus persistent dysthymic disorder.  I truly think it is the latter disorder.  We attempted to go back to Wellbutrin and to start off slowly 150 mg XL.  His second disorder is an adjustment disorder with an anxious mood state.  He will continue taking BuSpar 10 mg twice daily continue Ativan 1 mg twice daily.  He may take 1 extra Ativan now and then.  The patient drinks no alcohol and uses no drugs.  She will continue on one-to-one therapy with Denice Bors and return to see me in 7 weeks.  This patient's third problem is that of insomnia.  She will continue taking Ambien as prescribed.

## 2021-03-13 DIAGNOSIS — F422 Mixed obsessional thoughts and acts: Secondary | ICD-10-CM | POA: Diagnosis not present

## 2021-03-13 DIAGNOSIS — F341 Dysthymic disorder: Secondary | ICD-10-CM | POA: Diagnosis not present

## 2021-03-20 DIAGNOSIS — Z125 Encounter for screening for malignant neoplasm of prostate: Secondary | ICD-10-CM | POA: Diagnosis not present

## 2021-03-20 DIAGNOSIS — Z299 Encounter for prophylactic measures, unspecified: Secondary | ICD-10-CM | POA: Diagnosis not present

## 2021-03-20 DIAGNOSIS — Z79899 Other long term (current) drug therapy: Secondary | ICD-10-CM | POA: Diagnosis not present

## 2021-03-20 DIAGNOSIS — Z0000 Encounter for general adult medical examination without abnormal findings: Secondary | ICD-10-CM | POA: Diagnosis not present

## 2021-03-20 DIAGNOSIS — R5383 Other fatigue: Secondary | ICD-10-CM | POA: Diagnosis not present

## 2021-03-20 DIAGNOSIS — Z1331 Encounter for screening for depression: Secondary | ICD-10-CM | POA: Diagnosis not present

## 2021-03-20 DIAGNOSIS — Z6825 Body mass index (BMI) 25.0-25.9, adult: Secondary | ICD-10-CM | POA: Diagnosis not present

## 2021-03-20 DIAGNOSIS — I10 Essential (primary) hypertension: Secondary | ICD-10-CM | POA: Diagnosis not present

## 2021-03-20 DIAGNOSIS — Z789 Other specified health status: Secondary | ICD-10-CM | POA: Diagnosis not present

## 2021-03-20 DIAGNOSIS — Z7189 Other specified counseling: Secondary | ICD-10-CM | POA: Diagnosis not present

## 2021-03-20 DIAGNOSIS — E7800 Pure hypercholesterolemia, unspecified: Secondary | ICD-10-CM | POA: Diagnosis not present

## 2021-03-20 DIAGNOSIS — E039 Hypothyroidism, unspecified: Secondary | ICD-10-CM | POA: Diagnosis not present

## 2021-03-20 DIAGNOSIS — Z1339 Encounter for screening examination for other mental health and behavioral disorders: Secondary | ICD-10-CM | POA: Diagnosis not present

## 2021-03-26 DIAGNOSIS — I10 Essential (primary) hypertension: Secondary | ICD-10-CM | POA: Diagnosis not present

## 2021-04-03 DIAGNOSIS — F341 Dysthymic disorder: Secondary | ICD-10-CM | POA: Diagnosis not present

## 2021-04-03 DIAGNOSIS — F422 Mixed obsessional thoughts and acts: Secondary | ICD-10-CM | POA: Diagnosis not present

## 2021-04-17 ENCOUNTER — Ambulatory Visit (HOSPITAL_BASED_OUTPATIENT_CLINIC_OR_DEPARTMENT_OTHER): Payer: Medicare Other | Admitting: Psychiatry

## 2021-04-17 ENCOUNTER — Other Ambulatory Visit: Payer: Self-pay

## 2021-04-17 DIAGNOSIS — F341 Dysthymic disorder: Secondary | ICD-10-CM | POA: Diagnosis not present

## 2021-04-17 MED ORDER — LORAZEPAM 1 MG PO TABS: ORAL_TABLET | ORAL | 4 refills | Status: DC

## 2021-04-17 MED ORDER — BUSPIRONE HCL 10 MG PO TABS: ORAL_TABLET | ORAL | 3 refills | Status: DC

## 2021-04-17 MED ORDER — BUPROPION HCL ER (XL) 150 MG PO TB24: 150.0000 mg | ORAL_TABLET | ORAL | 2 refills | Status: DC

## 2021-04-17 NOTE — Progress Notes (Signed)
[name], male   DOB: [birth year], 71 y.o.   MRN: [id number] ?[facility] MD Progress Note ? ?04/17/2021 3:24 PM ?Douglas Bryant  ?MRN:  [id number] ?Subjective: Lethargic ?Principal Problem: Dysthymic disorder ? ?Today the patient is doing fairly well.  I think in some ways he is better.  He has had physical therapy for his back and he feels good about that.  He is in less pain.  He is happy with his medicines.  He is restarting his BuSpar and his Wellbutrin and he says he thinks the BuSpar is very helpful.  He continues taking Ativan 1 mg twice daily.  He has gotten back with his therapist Lauretta Chester who he speaks to every 3 to 4 weeks.  I think the patient is doing better.  He drinks no alcohol and uses no drugs.  He drove here without a problem.  Physically he is doing better I think financially he is stable. ?  ?Past Surgical History:  ?Procedure Laterality Date  ? BONE MARROW BIOPSY    ? COLONOSCOPY N/A 08/12/2018  ? Procedure: COLONOSCOPY;  Surgeon: [name], MD;  [facility];  Service: Endoscopy;  Laterality: N/A;  1030  ? HERNIA REPAIR    ? umblical times 2; inguinal (left)   ? IR REMOVAL TUN ACCESS W/ PORT W/O FL MOD SED  07/08/2016  ? POLYPECTOMY  08/12/2018  ? Procedure: POLYPECTOMY;  Surgeon: [name], MD;  [facility];  Service: Endoscopy;;  colon  ? port a cath placement    ? RHINOPLASTY    ? ?Family History:  ?Family History  ?Problem Relation Age of Onset  ? Alcohol abuse Father   ? Depression Sister   ? Anxiety disorder Sister   ? Depression Sister   ? Anxiety disorder Sister   ? ?Family Psychiatric  History:  ?Social History:  ?Social History  ? ?Substance and Sexual Activity  ?Alcohol Use Yes  ? Alcohol/week: 6.0 standard drinks  ? Types: 6 Cans of beer per week  ? Comment: beer occas  ?   ?Social History  ? ?Substance and Sexual Activity  ?Drug Use No  ? Frequency: 2.0 times per week  ? Types: Marijuana  ? Comment: smokes daily for thirty years.  currently takes one puff  ?  ?Social History  ? ?Socioeconomic History  ? Marital status: Divorced  ?  Spouse name: Not on file  ? Number of children: Not on file  ? Years of education: Not on file  ? Highest education level: Not on file  ?Occupational History  ? Not on file  ?Tobacco Use  ? Smoking status: Never  ? Smokeless tobacco: Never  ?Vaping Use  ? Vaping Use: Never used  ?Substance and Sexual Activity  ? Alcohol use: Yes  ?  Alcohol/week: 6.0 standard drinks  ?  Types: 6 Cans of beer per week  ?  Comment: beer occas  ? Drug use: No  ?  Frequency: 2.0 times per week  ?  Types: Marijuana  ?  Comment: smokes daily for thirty years. currently takes one puff  ? Sexual activity: Yes  ?  Partners: Female  ?  Birth control/protection: Condom  ?Other Topics Concern  ? Not on file  ?Social History Narrative  ? Not on file  ? ?Social Determinants of Health  ? ?Financial Resource Strain: Not on file  ?Food Insecurity: Not on file  ?Transportation Needs: Not on file  ?Physical  Activity: Not on file  ?Stress: Not on file  ?Social Connections: Not on file  ? ?Additional Social History:  ?  ?  ?  ?  ?  ?  ?  ?  ?  ?  ?  ? ?Sleep: Fair ? ?Appetite:  Good ? ?Current Medications: ?Current Outpatient Medications  ?Medication Sig Dispense Refill  ? acetaminophen (TYLENOL) 500 MG tablet Take 1,000 mg by mouth every 6 (six) hours as needed for mild pain.    ? buPROPion (WELLBUTRIN XL) 150 MG 24 hr tablet Take 1 tablet (150 mg total) by mouth every morning. 30 tablet 2  ? busPIRone (BUSPAR) 10 MG tablet 1 bid  1  qday prn 90 tablet 3  ? Ca Carbonate-Mag Hydroxide (ROLAIDS PO) Take 2 tablets by mouth daily as needed (heartburn).    ? Carboxymethylcellul-Glycerin (LUBRICATING EYE DROPS OP) Place 1 drop into both eyes daily as needed (dry eyes).    ? celecoxib (CELEBREX) 200 MG capsule Take 200 mg by mouth daily.    ? diphenhydrAMINE (BENADRYL) 25 mg capsule Take 25 mg by mouth at bedtime as needed for allergies or sleep.     ?  hydrochlorothiazide (MICROZIDE) 12.5 MG capsule Take 12.5 mg by mouth daily.    ? hydrocortisone cream 1 % Apply 1 application topically daily as needed for itching.    ? ibuprofen (ADVIL) 200 MG tablet Take 600-800 mg by mouth every 6 (six) hours as needed for headache or moderate pain.    ? levothyroxine (SYNTHROID) 125 MCG tablet Take by mouth.    ? LORazepam (ATIVAN) 1 MG tablet 1 bid   1  PRN 70 tablet 4  ? metoprolol succinate (TOPROL XL) 50 MG 24 hr tablet Take 1 tablet (50 mg total) by mouth daily. Take with or immediately following a meal. 30 tablet 11  ? oxymetazoline (AFRIN) 0.05 % nasal spray Place 1-2 sprays into both nostrils 2 (two) times daily as needed for congestion.    ? traMADol (ULTRAM) 50 MG tablet     ? valACYclovir (VALTREX) 500 MG tablet Take 500 mg by mouth daily as needed (Herpes Virus).    ? zolpidem (AMBIEN) 10 MG tablet Take 1 tablet (10 mg total) by mouth at bedtime. 30 tablet 5  ? ?No current facility-administered medications for this visit.  ? ? ?Lab Results: No results found for this or any previous visit (from the past 48 hour(s)). ? ?Blood Alcohol level:  ?No results found for: [facility] ? ?Physical Findings: ?AIMS:  , ,  ,  ,    ?CIWA:    ?COWS:    ? ?Musculoskeletal: ?Strength & Muscle Tone: within normal limits ?Gait & Station: normal ?Patient leans: N/A ? ?Psychiatric Specialty Exam: ?ROS  ?There were no vitals taken for this visit.There is no height or weight on file to calculate BMI.  ?General Appearance: Casual  ?Eye Contact::  Good  ?Speech:  Clear and Coherent  ?Volume:  Normal  ?Mood:mild depression  ?Affect:  Congruent  ?Thought Process:  Coherent  ?Orientation:  Full (Time, Place, and Person)  ?Thought Content:  WDL  ?Suicidal Thoughts:  No  ?Homicidal Thoughts:  No  ?Memory:  NA  ?Judgement:  Good  ?Insight:  Fair  ?Psychomotor Activity:  Normal  ?Concentration:  Good  ?Recall:  Good  ?Fund of [place]  ?Language: Fair  ?Akathisia:  No  ?Handed:  Right  ?AIMS (if  indicated):     ?Assets:  Desire for Improvement  ?ADL's:  Intact  ?Cognition: WNL  ?Sleep:     ? ?Treatment Plan Summary: ?04/17/2021,  ? ? ?This patient is diagnosed with persistent dysthymic disorder.  He will continue taking Wellbutrin 150 mg.  We talked about the possibility of increasing it to 300 mg but he chose not to.  He says he is taking a lot of medicines already and feels good what he is doing right now.  We will leave it at 150 mg.  He also has an adjustment disorder with an anxious mood state.  She will continue taking BuSpar 10 mg twice daily and continue his Ativan 1 mg 3 times daily.  Importantly will continue in one-to-one therapy with Mr. Denice Bors.  This patient will return to see me in 3 months. ?

## 2021-04-25 DIAGNOSIS — I10 Essential (primary) hypertension: Secondary | ICD-10-CM | POA: Diagnosis not present

## 2021-04-26 DIAGNOSIS — W1839XA Other fall on same level, initial encounter: Secondary | ICD-10-CM | POA: Diagnosis not present

## 2021-04-26 DIAGNOSIS — S0990XA Unspecified injury of head, initial encounter: Secondary | ICD-10-CM | POA: Diagnosis not present

## 2021-04-26 DIAGNOSIS — Z888 Allergy status to other drugs, medicaments and biological substances status: Secondary | ICD-10-CM | POA: Diagnosis not present

## 2021-04-26 DIAGNOSIS — S0101XA Laceration without foreign body of scalp, initial encounter: Secondary | ICD-10-CM | POA: Diagnosis not present

## 2021-04-30 DIAGNOSIS — M1710 Unilateral primary osteoarthritis, unspecified knee: Secondary | ICD-10-CM | POA: Diagnosis not present

## 2021-04-30 DIAGNOSIS — E7800 Pure hypercholesterolemia, unspecified: Secondary | ICD-10-CM | POA: Diagnosis not present

## 2021-04-30 DIAGNOSIS — Z6825 Body mass index (BMI) 25.0-25.9, adult: Secondary | ICD-10-CM | POA: Diagnosis not present

## 2021-04-30 DIAGNOSIS — Z299 Encounter for prophylactic measures, unspecified: Secondary | ICD-10-CM | POA: Diagnosis not present

## 2021-04-30 DIAGNOSIS — M545 Low back pain, unspecified: Secondary | ICD-10-CM | POA: Diagnosis not present

## 2021-04-30 DIAGNOSIS — I10 Essential (primary) hypertension: Secondary | ICD-10-CM | POA: Diagnosis not present

## 2021-04-30 DIAGNOSIS — Z789 Other specified health status: Secondary | ICD-10-CM | POA: Diagnosis not present

## 2021-05-07 DIAGNOSIS — E7800 Pure hypercholesterolemia, unspecified: Secondary | ICD-10-CM | POA: Diagnosis not present

## 2021-05-07 DIAGNOSIS — Z299 Encounter for prophylactic measures, unspecified: Secondary | ICD-10-CM | POA: Diagnosis not present

## 2021-05-07 DIAGNOSIS — E038 Other specified hypothyroidism: Secondary | ICD-10-CM | POA: Diagnosis not present

## 2021-05-07 DIAGNOSIS — I10 Essential (primary) hypertension: Secondary | ICD-10-CM | POA: Diagnosis not present

## 2021-05-07 DIAGNOSIS — F32A Depression, unspecified: Secondary | ICD-10-CM | POA: Diagnosis not present

## 2021-05-13 DIAGNOSIS — R262 Difficulty in walking, not elsewhere classified: Secondary | ICD-10-CM | POA: Diagnosis not present

## 2021-05-13 DIAGNOSIS — R29898 Other symptoms and signs involving the musculoskeletal system: Secondary | ICD-10-CM | POA: Diagnosis not present

## 2021-05-13 DIAGNOSIS — R2681 Unsteadiness on feet: Secondary | ICD-10-CM | POA: Diagnosis not present

## 2021-05-15 DIAGNOSIS — R2681 Unsteadiness on feet: Secondary | ICD-10-CM | POA: Diagnosis not present

## 2021-05-15 DIAGNOSIS — R262 Difficulty in walking, not elsewhere classified: Secondary | ICD-10-CM | POA: Diagnosis not present

## 2021-05-15 DIAGNOSIS — R29898 Other symptoms and signs involving the musculoskeletal system: Secondary | ICD-10-CM | POA: Diagnosis not present

## 2021-05-21 DIAGNOSIS — Z87891 Personal history of nicotine dependence: Secondary | ICD-10-CM | POA: Diagnosis not present

## 2021-05-21 DIAGNOSIS — R262 Difficulty in walking, not elsewhere classified: Secondary | ICD-10-CM | POA: Diagnosis not present

## 2021-05-21 DIAGNOSIS — R2681 Unsteadiness on feet: Secondary | ICD-10-CM | POA: Diagnosis not present

## 2021-05-21 DIAGNOSIS — Z299 Encounter for prophylactic measures, unspecified: Secondary | ICD-10-CM | POA: Diagnosis not present

## 2021-05-21 DIAGNOSIS — E038 Other specified hypothyroidism: Secondary | ICD-10-CM | POA: Diagnosis not present

## 2021-05-21 DIAGNOSIS — R29898 Other symptoms and signs involving the musculoskeletal system: Secondary | ICD-10-CM | POA: Diagnosis not present

## 2021-05-21 DIAGNOSIS — I10 Essential (primary) hypertension: Secondary | ICD-10-CM | POA: Diagnosis not present

## 2021-05-21 DIAGNOSIS — Z6824 Body mass index (BMI) 24.0-24.9, adult: Secondary | ICD-10-CM | POA: Diagnosis not present

## 2021-05-23 DIAGNOSIS — R2681 Unsteadiness on feet: Secondary | ICD-10-CM | POA: Diagnosis not present

## 2021-05-23 DIAGNOSIS — R262 Difficulty in walking, not elsewhere classified: Secondary | ICD-10-CM | POA: Diagnosis not present

## 2021-05-23 DIAGNOSIS — R29898 Other symptoms and signs involving the musculoskeletal system: Secondary | ICD-10-CM | POA: Diagnosis not present

## 2021-05-26 DIAGNOSIS — I10 Essential (primary) hypertension: Secondary | ICD-10-CM | POA: Diagnosis not present

## 2021-05-27 DIAGNOSIS — R2681 Unsteadiness on feet: Secondary | ICD-10-CM | POA: Diagnosis not present

## 2021-05-27 DIAGNOSIS — R262 Difficulty in walking, not elsewhere classified: Secondary | ICD-10-CM | POA: Diagnosis not present

## 2021-05-27 DIAGNOSIS — R29898 Other symptoms and signs involving the musculoskeletal system: Secondary | ICD-10-CM | POA: Diagnosis not present

## 2021-05-30 DIAGNOSIS — R2681 Unsteadiness on feet: Secondary | ICD-10-CM | POA: Diagnosis not present

## 2021-05-30 DIAGNOSIS — R262 Difficulty in walking, not elsewhere classified: Secondary | ICD-10-CM | POA: Diagnosis not present

## 2021-05-30 DIAGNOSIS — R29898 Other symptoms and signs involving the musculoskeletal system: Secondary | ICD-10-CM | POA: Diagnosis not present

## 2021-06-04 DIAGNOSIS — R262 Difficulty in walking, not elsewhere classified: Secondary | ICD-10-CM | POA: Diagnosis not present

## 2021-06-04 DIAGNOSIS — R2681 Unsteadiness on feet: Secondary | ICD-10-CM | POA: Diagnosis not present

## 2021-06-04 DIAGNOSIS — R29898 Other symptoms and signs involving the musculoskeletal system: Secondary | ICD-10-CM | POA: Diagnosis not present

## 2021-06-06 DIAGNOSIS — R29898 Other symptoms and signs involving the musculoskeletal system: Secondary | ICD-10-CM | POA: Diagnosis not present

## 2021-06-06 DIAGNOSIS — R2681 Unsteadiness on feet: Secondary | ICD-10-CM | POA: Diagnosis not present

## 2021-06-06 DIAGNOSIS — R262 Difficulty in walking, not elsewhere classified: Secondary | ICD-10-CM | POA: Diagnosis not present

## 2021-06-10 ENCOUNTER — Other Ambulatory Visit (HOSPITAL_COMMUNITY): Payer: Self-pay | Admitting: *Deleted

## 2021-06-10 MED ORDER — ZOLPIDEM TARTRATE 10 MG PO TABS: 10.0000 mg | ORAL_TABLET | Freq: Every day | ORAL | 2 refills | Status: DC

## 2021-06-10 MED ORDER — BUPROPION HCL ER (XL) 150 MG PO TB24: 150.0000 mg | ORAL_TABLET | ORAL | 0 refills | Status: DC

## 2021-06-11 DIAGNOSIS — R2681 Unsteadiness on feet: Secondary | ICD-10-CM | POA: Diagnosis not present

## 2021-06-11 DIAGNOSIS — R29898 Other symptoms and signs involving the musculoskeletal system: Secondary | ICD-10-CM | POA: Diagnosis not present

## 2021-06-11 DIAGNOSIS — R262 Difficulty in walking, not elsewhere classified: Secondary | ICD-10-CM | POA: Diagnosis not present

## 2021-06-14 DIAGNOSIS — R29898 Other symptoms and signs involving the musculoskeletal system: Secondary | ICD-10-CM | POA: Diagnosis not present

## 2021-06-14 DIAGNOSIS — R2681 Unsteadiness on feet: Secondary | ICD-10-CM | POA: Diagnosis not present

## 2021-06-14 DIAGNOSIS — R262 Difficulty in walking, not elsewhere classified: Secondary | ICD-10-CM | POA: Diagnosis not present

## 2021-06-17 DIAGNOSIS — R29898 Other symptoms and signs involving the musculoskeletal system: Secondary | ICD-10-CM | POA: Diagnosis not present

## 2021-06-17 DIAGNOSIS — R2681 Unsteadiness on feet: Secondary | ICD-10-CM | POA: Diagnosis not present

## 2021-06-17 DIAGNOSIS — R262 Difficulty in walking, not elsewhere classified: Secondary | ICD-10-CM | POA: Diagnosis not present

## 2021-06-19 DIAGNOSIS — R262 Difficulty in walking, not elsewhere classified: Secondary | ICD-10-CM | POA: Diagnosis not present

## 2021-06-19 DIAGNOSIS — R2681 Unsteadiness on feet: Secondary | ICD-10-CM | POA: Diagnosis not present

## 2021-06-19 DIAGNOSIS — R29898 Other symptoms and signs involving the musculoskeletal system: Secondary | ICD-10-CM | POA: Diagnosis not present

## 2021-06-20 DIAGNOSIS — F341 Dysthymic disorder: Secondary | ICD-10-CM | POA: Diagnosis not present

## 2021-06-20 DIAGNOSIS — F422 Mixed obsessional thoughts and acts: Secondary | ICD-10-CM | POA: Diagnosis not present

## 2021-06-25 DIAGNOSIS — I10 Essential (primary) hypertension: Secondary | ICD-10-CM | POA: Diagnosis not present

## 2021-07-01 ENCOUNTER — Other Ambulatory Visit: Payer: Self-pay | Admitting: *Deleted

## 2021-07-01 DIAGNOSIS — I7121 Aneurysm of the ascending aorta, without rupture: Secondary | ICD-10-CM

## 2021-07-03 DIAGNOSIS — R29898 Other symptoms and signs involving the musculoskeletal system: Secondary | ICD-10-CM | POA: Diagnosis not present

## 2021-07-03 DIAGNOSIS — R262 Difficulty in walking, not elsewhere classified: Secondary | ICD-10-CM | POA: Diagnosis not present

## 2021-07-03 DIAGNOSIS — R2681 Unsteadiness on feet: Secondary | ICD-10-CM | POA: Diagnosis not present

## 2021-07-05 DIAGNOSIS — R29898 Other symptoms and signs involving the musculoskeletal system: Secondary | ICD-10-CM | POA: Diagnosis not present

## 2021-07-05 DIAGNOSIS — R2681 Unsteadiness on feet: Secondary | ICD-10-CM | POA: Diagnosis not present

## 2021-07-05 DIAGNOSIS — R262 Difficulty in walking, not elsewhere classified: Secondary | ICD-10-CM | POA: Diagnosis not present

## 2021-07-12 DIAGNOSIS — R2681 Unsteadiness on feet: Secondary | ICD-10-CM | POA: Diagnosis not present

## 2021-07-12 DIAGNOSIS — R29898 Other symptoms and signs involving the musculoskeletal system: Secondary | ICD-10-CM | POA: Diagnosis not present

## 2021-07-12 DIAGNOSIS — R262 Difficulty in walking, not elsewhere classified: Secondary | ICD-10-CM | POA: Diagnosis not present

## 2021-07-16 DIAGNOSIS — R29898 Other symptoms and signs involving the musculoskeletal system: Secondary | ICD-10-CM | POA: Diagnosis not present

## 2021-07-16 DIAGNOSIS — R262 Difficulty in walking, not elsewhere classified: Secondary | ICD-10-CM | POA: Diagnosis not present

## 2021-07-16 DIAGNOSIS — R2681 Unsteadiness on feet: Secondary | ICD-10-CM | POA: Diagnosis not present

## 2021-07-23 DIAGNOSIS — R262 Difficulty in walking, not elsewhere classified: Secondary | ICD-10-CM | POA: Diagnosis not present

## 2021-07-23 DIAGNOSIS — R2681 Unsteadiness on feet: Secondary | ICD-10-CM | POA: Diagnosis not present

## 2021-07-23 DIAGNOSIS — R29898 Other symptoms and signs involving the musculoskeletal system: Secondary | ICD-10-CM | POA: Diagnosis not present

## 2021-07-24 ENCOUNTER — Ambulatory Visit (HOSPITAL_BASED_OUTPATIENT_CLINIC_OR_DEPARTMENT_OTHER): Payer: Medicare Other | Admitting: Psychiatry

## 2021-07-24 DIAGNOSIS — F341 Dysthymic disorder: Secondary | ICD-10-CM | POA: Diagnosis not present

## 2021-07-24 MED ORDER — ZOLPIDEM TARTRATE 10 MG PO TABS
10.0000 mg | ORAL_TABLET | Freq: Every day | ORAL | 2 refills | Status: DC
Start: 2021-07-24 — End: 2021-10-15

## 2021-07-24 MED ORDER — BUSPIRONE HCL 10 MG PO TABS: ORAL_TABLET | ORAL | 3 refills | Status: DC

## 2021-07-24 MED ORDER — CLONAZEPAM 0.5 MG PO TABS: ORAL_TABLET | ORAL | 3 refills | Status: DC

## 2021-07-24 NOTE — Progress Notes (Signed)
[name], male   DOB: 04/07/1950, 71 y.o.   MRN: [id number] [facility] MD Progress Note  07/24/2021 3:12 PM Douglas Bryant  MRN:  [id number] Subjective: Lethargic Principal Problem: Dysthymic disorder  Today the patient seems to be somewhat stable.  He has actually had 3 falls.  He hit his head.  He is on a cane today.  The patient has chronic back pain.  The patient is sleeping and eating fairly well.  His health however is going downhill.  He has trouble with his left knee.  He is reading some.  He and his sister got a new dog a pug who is 38 years old.  The patient is reconnecting with his first wife and spends time with her at her home.  He is reconnected with one of his sons.  Unfortunately one of his sons is getting divorced.  The patient has a sister who has cancer and another sister is doing very well.  Patient admits that he is not taking Wellbutrin regularly.  The patient is trying to reorganize himself and trying to start an exercise at the [facility].  Today he asked me if he could change from Ativan to Klonopin which he remembers he took it more [place] away when he took it.   Past Surgical History:  Procedure Laterality Date   BONE MARROW BIOPSY     COLONOSCOPY N/A 08/12/2018   Procedure: COLONOSCOPY;  Surgeon: [name], MD;  [facility];  Service: Endoscopy;  Laterality: N/A;  1030   HERNIA REPAIR     umblical times 2; inguinal (left)    IR REMOVAL TUN ACCESS W/ PORT W/O FL MOD SED  07/08/2016   POLYPECTOMY  08/12/2018   Procedure: POLYPECTOMY;  Surgeon: [name], MD;  [facility];  Service: Endoscopy;;  colon   port a cath placement     RHINOPLASTY     Family History:  Family History  Problem Relation Age of Onset   Alcohol abuse Father    Depression Sister    Anxiety disorder Sister    Depression Sister    Anxiety disorder Sister    Family Psychiatric  History:  Social History:  Social History   Substance and Sexual  Activity  Alcohol Use Yes   Alcohol/week: 6.0 standard drinks of alcohol   Types: 6 Cans of beer per week   Comment: beer occas     Social History   Substance and Sexual Activity  Drug Use No   Frequency: 2.0 times per week   Types: Marijuana   Comment: smokes daily for thirty years. currently takes one puff    Social History   Socioeconomic History   Marital status: Divorced    Spouse name: Not on file   Number of children: Not on file   Years of education: Not on file   Highest education level: Not on file  Occupational History   Not on file  Tobacco Use   Smoking status: Never   Smokeless tobacco: Never  Vaping Use   Vaping Use: Never used  Substance and Sexual Activity   Alcohol use: Yes    Alcohol/week: 6.0 standard drinks of alcohol    Types: 6 Cans of beer per week    Comment: beer occas   Drug use: No    Frequency: 2.0 times per week    Types: Marijuana    Comment: smokes daily for thirty years. currently takes one puff  Sexual activity: Yes    Partners: Female    Birth control/protection: Condom  Other Topics Concern   Not on file  Social History Narrative   Not on file   Social Determinants of Health   Financial Resource Strain: Not on file  Food Insecurity: Not on file  Transportation Needs: Not on file  Physical Activity: Not on file  Stress: Not on file  Social Connections: Not on file   Additional Social History:                         Sleep: Fair  Appetite:  Good  Current Medications: Current Outpatient Medications  Medication Sig Dispense Refill   clonazePAM (KLONOPIN) 0.5 MG tablet 1 bid  1  prn 70 tablet 3   acetaminophen (TYLENOL) 500 MG tablet Take 1,000 mg by mouth every 6 (six) hours as needed for mild pain.     busPIRone (BUSPAR) 10 MG tablet 1 bid 60 tablet 3   Ca Carbonate-Mag Hydroxide (ROLAIDS PO) Take 2 tablets by mouth daily as needed (heartburn).     Carboxymethylcellul-Glycerin (LUBRICATING EYE DROPS OP)  Place 1 drop into both eyes daily as needed (dry eyes).     celecoxib (CELEBREX) 200 MG capsule Take 200 mg by mouth daily.     diphenhydrAMINE (BENADRYL) 25 mg capsule Take 25 mg by mouth at bedtime as needed for allergies or sleep.      hydrochlorothiazide (MICROZIDE) 12.5 MG capsule Take 12.5 mg by mouth daily.     hydrocortisone cream 1 % Apply 1 application topically daily as needed for itching.     ibuprofen (ADVIL) 200 MG tablet Take 600-800 mg by mouth every 6 (six) hours as needed for headache or moderate pain.     levothyroxine (SYNTHROID) 125 MCG tablet Take by mouth.     metoprolol succinate (TOPROL XL) 50 MG 24 hr tablet Take 1 tablet (50 mg total) by mouth daily. Take with or immediately following a meal. 30 tablet 11   oxymetazoline (AFRIN) 0.05 % nasal spray Place 1-2 sprays into both nostrils 2 (two) times daily as needed for congestion.     traMADol (ULTRAM) 50 MG tablet      valACYclovir (VALTREX) 500 MG tablet Take 500 mg by mouth daily as needed (Herpes Virus).     zolpidem (AMBIEN) 10 MG tablet Take 1 tablet (10 mg total) by mouth at bedtime. 30 tablet 2   No current facility-administered medications for this visit.    Lab Results: No results found for this or any previous visit (from the past 48 hour(s)).  Blood Alcohol level:  No results found for: "ETH"  Physical Findings: AIMS:  , ,  ,  ,    CIWA:    COWS:     Musculoskeletal: Strength & Muscle Tone: within normal limits Gait & Station: normal Patient leans: N/A  Psychiatric Specialty Exam: ROS  There were no vitals taken for this visit.There is no height or weight on file to calculate BMI.  General Appearance: Casual  Eye Contact::  Good  Speech:  Clear and Coherent  Volume:  Normal  Mood:mild depression  Affect:  Congruent  Thought Process:  Coherent  Orientation:  Full (Time, Place, and Person)  Thought Content:  WDL  Suicidal Thoughts:  No  Homicidal Thoughts:  No  Memory:  NA  Judgement:   Good  Insight:  Fair  Psychomotor Activity:  Normal  Concentration:  Good  Recall:  [place] of Knowledge:Good  Language: Fair  Akathisia:  No  Handed:  Right  AIMS (if indicated):     Assets:  Desire for Improvement  ADL's:  Intact  Cognition: WNL  Sleep:      Treatment Plan Summary: 07/24/2021,   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  At this time we decided to discontinue his Wellbutrin as he takes it erratically.  He is not consistent.  His second disorder is an adjustment disorder with an anxious mood state.  Again he takes BuSpar on a as needed basis.  He was instructed to take it regularly.  Today he is agreed to take it regularly.  Today regarding changes Ativan from 1 mg twice daily and 1 as needed where he received 70 of them in regard to changing to Klonopin 0.5 mg twice daily with 1 as needed and will get 70 of those.  In essence for reducing his benzodiazepines.  I shared with him that Klonopin will build up over time and get probably the same effect as he does with the Ativan.  I think it would be safer for him.  Given the fact that he is 71 years old and has had some falls I am interested in reducing his benzodiazepines despite the fact that he has been taking it regularly.  But is also agreed to get back on BuSpar on a regular basis 10 mg twice daily.  He continues in one-to-one therapy with Mr. Lauretta Chester on a monthly basis.  This patient she will return to see me in 2-1/2 months.

## 2021-08-01 ENCOUNTER — Other Ambulatory Visit: Payer: Medicare Other

## 2021-08-02 NOTE — Progress Notes (Unsigned)
      [address]       [place] [address]             [phone]        Maze Corniel III [id number] October 30, 1950  History of Present Illness:  Mr. Yoss is a 71 year old gentleman with a past history of non-Hodgkin's lymphoma treated with chemotherapy, hypertension, anxiety, depression, and aortic atherosclerosis.  The patient was initially diagnosed in 2017 and has been followed by Dr. Roxan Hockey since that time.  He presents today for 1 year surveillance follow up.     Current Outpatient Medications on File Prior to Visit  Medication Sig Dispense Refill   acetaminophen (TYLENOL) 500 MG tablet Take 1,000 mg by mouth every 6 (six) hours as needed for mild pain.     busPIRone (BUSPAR) 10 MG tablet 1 bid 60 tablet 3   Ca Carbonate-Mag Hydroxide (ROLAIDS PO) Take 2 tablets by mouth daily as needed (heartburn).     Carboxymethylcellul-Glycerin (LUBRICATING EYE DROPS OP) Place 1 drop into both eyes daily as needed (dry eyes).     celecoxib (CELEBREX) 200 MG capsule Take 200 mg by mouth daily.     clonazePAM (KLONOPIN) 0.5 MG tablet 1 bid  1  prn 70 tablet 3   diphenhydrAMINE (BENADRYL) 25 mg capsule Take 25 mg by mouth at bedtime as needed for allergies or sleep.      hydrochlorothiazide (MICROZIDE) 12.5 MG capsule Take 12.5 mg by mouth daily.     hydrocortisone cream 1 % Apply 1 application topically daily as needed for itching.     ibuprofen (ADVIL) 200 MG tablet Take 600-800 mg by mouth every 6 (six) hours as needed for headache or moderate pain.     levothyroxine (SYNTHROID) 125 MCG tablet Take by mouth.     metoprolol succinate (TOPROL XL) 50 MG 24 hr tablet Take 1 tablet (50 mg total) by mouth daily. Take with or immediately following a meal. 30 tablet 11   oxymetazoline (AFRIN) 0.05 % nasal spray Place 1-2 sprays into both nostrils 2 (two) times daily as needed for congestion.     traMADol (ULTRAM) 50 MG tablet      valACYclovir (VALTREX) 500 MG tablet  Take 500 mg by mouth daily as needed (Herpes Virus).     zolpidem (AMBIEN) 10 MG tablet Take 1 tablet (10 mg total) by mouth at bedtime. 30 tablet 2   No current facility-administered medications on file prior to visit.     There were no vitals taken for this visit.  Physical Exam  CTA Results:      A/P:      Risk Modification:  Statin:  ***  Smoking cessation instruction/counseling given:  {CHL AMB [facility] SMOKING CESSATION COUNSELING:20758}  Patient was counseled on importance of Blood Pressure Control.  Despite Medical intervention if the patient notices persistently elevated blood pressure readings.  They are instructed to contact their Primary Care Physician  Please avoid use of Fluoroquinolones as this can potentially increase your risk of Aortic Rupture and/or Dissection  Patient educated on signs and symptoms of Aortic Dissection, handout also provided in AVS  Yaakov Saindon, PA-C 08/02/21

## 2021-08-02 NOTE — Patient Instructions (Signed)
Make every effort to maintain a "heart-healthy" lifestyle with regular physical exercise and adherence to a low-fat, low-carbohydrate diet.  Continue to seek regular follow-up appointments with your primary care physician and/or cardiologist.   AVOID FLUROQUINOLONES (Cipro)- as these antibiotics can increase risk of aortic dissection

## 2021-08-05 ENCOUNTER — Other Ambulatory Visit: Payer: Medicare Other

## 2021-08-05 ENCOUNTER — Ambulatory Visit
Admission: RE | Admit: 2021-08-05 | Discharge: 2021-08-05 | Disposition: A | Discharge status: Home / Self Care | Payer: Medicare Other | Source: Ambulatory Visit | Attending: Thoracic Surgery (Cardiothoracic Vascular Surgery) | Admitting: Thoracic Surgery (Cardiothoracic Vascular Surgery)

## 2021-08-05 DIAGNOSIS — I712 Thoracic aortic aneurysm, without rupture, unspecified: Secondary | ICD-10-CM | POA: Diagnosis not present

## 2021-08-05 DIAGNOSIS — J984 Other disorders of lung: Secondary | ICD-10-CM | POA: Diagnosis not present

## 2021-08-05 DIAGNOSIS — I7121 Aneurysm of the ascending aorta, without rupture: Secondary | ICD-10-CM

## 2021-08-05 DIAGNOSIS — J9811 Atelectasis: Secondary | ICD-10-CM | POA: Diagnosis not present

## 2021-08-05 DIAGNOSIS — I2510 Atherosclerotic heart disease of native coronary artery without angina pectoris: Secondary | ICD-10-CM | POA: Diagnosis not present

## 2021-08-05 MED ORDER — IOPAMIDOL (ISOVUE-370) INJECTION 76%
75.0000 mL | Freq: Once | INTRAVENOUS | Status: AC | PRN
  Administered 2021-08-05: 75 mL via INTRAVENOUS

## 2021-08-07 ENCOUNTER — Ambulatory Visit (INDEPENDENT_AMBULATORY_CARE_PROVIDER_SITE_OTHER): Payer: Medicare Other | Admitting: Physician Assistant

## 2021-08-07 VITALS — BP 150/79 | HR 68 | Resp 20 | Ht 73.0 in | Wt 185.0 lb

## 2021-08-07 DIAGNOSIS — I7121 Aneurysm of the ascending aorta, without rupture: Secondary | ICD-10-CM | POA: Diagnosis not present

## 2021-08-07 MED ORDER — HYDROCHLOROTHIAZIDE 25 MG PO TABS: 25.0000 mg | ORAL_TABLET | Freq: Every day | ORAL | 3 refills | Status: DC

## 2021-08-07 MED ORDER — METOPROLOL SUCCINATE ER 25 MG PO TB24: 25.0000 mg | ORAL_TABLET | Freq: Every day | ORAL | 11 refills | Status: DC

## 2021-08-26 DIAGNOSIS — I10 Essential (primary) hypertension: Secondary | ICD-10-CM | POA: Diagnosis not present

## 2021-08-27 DIAGNOSIS — Z299 Encounter for prophylactic measures, unspecified: Secondary | ICD-10-CM | POA: Diagnosis not present

## 2021-08-27 DIAGNOSIS — M545 Low back pain, unspecified: Secondary | ICD-10-CM | POA: Diagnosis not present

## 2021-08-27 DIAGNOSIS — M1710 Unilateral primary osteoarthritis, unspecified knee: Secondary | ICD-10-CM | POA: Diagnosis not present

## 2021-08-27 DIAGNOSIS — I10 Essential (primary) hypertension: Secondary | ICD-10-CM | POA: Diagnosis not present

## 2021-09-09 ENCOUNTER — Encounter: Payer: Self-pay | Admitting: Radiology

## 2021-09-11 DIAGNOSIS — F422 Mixed obsessional thoughts and acts: Secondary | ICD-10-CM | POA: Diagnosis not present

## 2021-09-11 DIAGNOSIS — F341 Dysthymic disorder: Secondary | ICD-10-CM | POA: Diagnosis not present

## 2021-09-25 DIAGNOSIS — I10 Essential (primary) hypertension: Secondary | ICD-10-CM | POA: Diagnosis not present

## 2021-09-26 DIAGNOSIS — F422 Mixed obsessional thoughts and acts: Secondary | ICD-10-CM | POA: Diagnosis not present

## 2021-09-26 DIAGNOSIS — F341 Dysthymic disorder: Secondary | ICD-10-CM | POA: Diagnosis not present

## 2021-10-03 ENCOUNTER — Ambulatory Visit (INDEPENDENT_AMBULATORY_CARE_PROVIDER_SITE_OTHER): Payer: Medicare Other

## 2021-10-03 ENCOUNTER — Encounter: Payer: Self-pay | Admitting: Orthopedic Surgery

## 2021-10-03 ENCOUNTER — Ambulatory Visit (INDEPENDENT_AMBULATORY_CARE_PROVIDER_SITE_OTHER): Payer: Medicare Other | Admitting: Orthopedic Surgery

## 2021-10-03 VITALS — BP 118/75 | HR 62 | Ht 73.0 in | Wt 184.1 lb

## 2021-10-03 DIAGNOSIS — G8929 Other chronic pain: Secondary | ICD-10-CM

## 2021-10-03 DIAGNOSIS — M1712 Unilateral primary osteoarthritis, left knee: Secondary | ICD-10-CM | POA: Diagnosis not present

## 2021-10-03 NOTE — Patient Instructions (Signed)
Your insurance doesn't need any  approval for MRI please go ahead and call to schedule your appointment with [facility] within at least one (1) week.   Central Scheduling [phone]

## 2021-10-03 NOTE — Progress Notes (Signed)
Chief Complaint  Patient presents with   Knee Pain    L/its been hurting for about 3 to 4 years now. From January to April I have fallen several times. My knee buckles with me.    HPI: 71 year old male presents for evaluation of giving out symptoms locking catching of his left knee  He has a history of spinal stenosis which was never corrected.  He has had epidural injections which helped with the pain.  However the patient has frequent episodes of his left knee giving way and bilateral weakness in both lower extremities  The patient has had a hyperextension injury to his knee he has had several falls involving the left knee.  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   Environmental allergies    History of blood transfusion    History of bronchitis    History of chemotherapy    Hypertension    Hypothyroidism    Nocturia    Peripheral vascular disease (HCC)    Urinary frequency     BP 118/75   Pulse 62   Ht '6\' 1"'$  (1.854 m)   Wt 184 lb 2 oz (83.5 kg)   BMI 24.29 kg/m    General appearance: Well-developed well-nourished no gross deformities  Cardiovascular normal pulse and perfusion normal color without edema  Neurologically no sensation loss or deficits or pathologic reflexes  Psychological: Awake alert and oriented x3 mood and affect normal  Skin no lacerations or ulcerations no nodularity no palpable masses, no erythema or nodularity  Musculoskeletal: Patient examined dependently ambulatory no assistive devices.  He has no effusions left knee his range of motion is full his ligament exam was normal.  Imaging outside imaging included 2020 he had x-rays of his left and right knee  The left knee film shows symmetric narrowing with no osteophytes this is definitely a type I perhaps a type II KG arthritis  Right knee similar findings  symmetric joint space narrowing no osteophytes KG type I/II arthritis  Imaging was repeated of the left knee  Plan and assessment  71 year old male giving way symptoms left knee mechanical symptoms left knee ligaments feel stable x-rays show a grade 2 arthritis  Recommend MRI to rule out an intra-articular problem regarding the right knee joint.  If this proves to be normal then his giving way symptoms are primarily related to his spinal stenosis

## 2021-10-08 DIAGNOSIS — F341 Dysthymic disorder: Secondary | ICD-10-CM | POA: Diagnosis not present

## 2021-10-08 DIAGNOSIS — F422 Mixed obsessional thoughts and acts: Secondary | ICD-10-CM | POA: Diagnosis not present

## 2021-10-11 DIAGNOSIS — H353131 Nonexudative age-related macular degeneration, bilateral, early dry stage: Secondary | ICD-10-CM | POA: Diagnosis not present

## 2021-10-15 ENCOUNTER — Encounter (HOSPITAL_COMMUNITY): Payer: Self-pay | Admitting: Psychiatry

## 2021-10-15 ENCOUNTER — Ambulatory Visit (HOSPITAL_BASED_OUTPATIENT_CLINIC_OR_DEPARTMENT_OTHER): Payer: Medicare Other | Admitting: Psychiatry

## 2021-10-15 VITALS — BP 145/81 | HR 98 | Ht 73.0 in | Wt 187.0 lb

## 2021-10-15 DIAGNOSIS — F4323 Adjustment disorder with mixed anxiety and depressed mood: Secondary | ICD-10-CM | POA: Diagnosis not present

## 2021-10-15 MED ORDER — ZOLPIDEM TARTRATE 10 MG PO TABS: 10.0000 mg | ORAL_TABLET | Freq: Every day | ORAL | 4 refills | Status: DC

## 2021-10-15 MED ORDER — CLONAZEPAM 0.5 MG PO TABS
ORAL_TABLET | ORAL | 4 refills | Status: DC
Start: 2021-10-15 — End: 2022-02-18

## 2021-10-15 MED ORDER — BUSPIRONE HCL 10 MG PO TABS
ORAL_TABLET | ORAL | 4 refills | Status: DC
Start: 2021-10-15 — End: 2022-05-27

## 2021-10-15 NOTE — Progress Notes (Signed)
[name], male   DOB: 05/14/1950, 71 y.o.   MRN: [id number] [facility] MD Progress Note  10/15/2021 3:55 PM Douglas Bryant  MRN:  [id number] Subjective: Lethargic Principal Problem: Dysthymic disorder   Today the patient is at his baseline.  The stresses are mainly related to his 2 sisters.  He has 1 sister who has leukemia who did not do well marrow transplant.  He is back in the hospital getting treatment.  His other sister who he has been living with who owns the house he is in recently had a stroke and now she is in a nursing home.  The patient has 2 sons.  One of his sons does very well and lives in [place].  The other son is still in this community but is back with his mother the patient's second wife.  The patient has seen his own first wife a little bit they seem to have somewhat of a relationship but it is Warehouse manager.  Overall patient claims that he is anxious but it is hard to see it.  The patient has an MRI coming up of his knee.  He has been diagnosed with spinal stenosis.  He uses a cane.  He has had numerous falls.  The patient is adopted his sister's dog who he likes a lot.  It is a small dog.  The patient is significant problem with constipation and with urinating at night.  The combination of the dog urinating in and so forth it affects his sleep.  Nonetheless the patient will continue taking Ambien for sleep.  I believe his anxiety is relatively well controlled.  The patient continues in therapy with Mr. Lauretta Chester.   Past Surgical History:  Procedure Laterality Date   BONE MARROW BIOPSY     COLONOSCOPY N/A 08/12/2018   Procedure: COLONOSCOPY;  Surgeon: [name], MD;  [facility];  Service: Endoscopy;  Laterality: N/A;  1030   HERNIA REPAIR     umblical times 2; inguinal (left)    IR REMOVAL TUN ACCESS W/ PORT W/O FL MOD SED  07/08/2016   POLYPECTOMY  08/12/2018   Procedure: POLYPECTOMY;  Surgeon: [name], MD;  [facility];   Service: Endoscopy;;  colon   port a cath placement     RHINOPLASTY     Family History:  Family History  Problem Relation Age of Onset   Alcohol abuse Father    Depression Sister    Anxiety disorder Sister    Depression Sister    Anxiety disorder Sister    Family Psychiatric  History:  Social History:  Social History   Substance and Sexual Activity  Alcohol Use Yes   Alcohol/week: 6.0 standard drinks of alcohol   Types: 6 Cans of beer per week   Comment: beer occas     Social History   Substance and Sexual Activity  Drug Use No   Frequency: 2.0 times per week   Types: Marijuana   Comment: smokes daily for thirty years. currently takes one puff    Social History   Socioeconomic History   Marital status: Divorced    Spouse name: Not on file   Number of children: Not on file   Years of education: Not on file   Highest education level: Not on file  Occupational History   Not on file  Tobacco Use   Smoking status: Never   Smokeless tobacco: Never  Vaping Use   Vaping Use:  Never used  Substance and Sexual Activity   Alcohol use: Yes    Alcohol/week: 6.0 standard drinks of alcohol    Types: 6 Cans of beer per week    Comment: beer occas   Drug use: No    Frequency: 2.0 times per week    Types: Marijuana    Comment: smokes daily for thirty years. currently takes one puff   Sexual activity: Yes    Partners: Female    Birth control/protection: Condom  Other Topics Concern   Not on file  Social History Narrative   Not on file   Social Determinants of Health   Financial Resource Strain: Not on file  Food Insecurity: Not on file  Transportation Needs: Not on file  Physical Activity: Not on file  Stress: Not on file  Social Connections: Not on file   Additional Social History:                         Sleep: Fair  Appetite:  Good  Current Medications: Current Outpatient Medications  Medication Sig Dispense Refill   acetaminophen (TYLENOL)  500 MG tablet Take 1,000 mg by mouth every 6 (six) hours as needed for mild pain.     Ca Carbonate-Mag Hydroxide (ROLAIDS PO) Take 2 tablets by mouth daily as needed (heartburn).     Carboxymethylcellul-Glycerin (LUBRICATING EYE DROPS OP) Place 1 drop into both eyes daily as needed (dry eyes).     celecoxib (CELEBREX) 200 MG capsule Take 200 mg by mouth daily.     diphenhydrAMINE (BENADRYL) 25 mg capsule Take 25 mg by mouth at bedtime as needed for allergies or sleep.      hydrochlorothiazide (HYDRODIURIL) 25 MG tablet Take 1 tablet (25 mg total) by mouth daily. 30 tablet 3   hydrocortisone cream 1 % Apply 1 application topically daily as needed for itching.     ibuprofen (ADVIL) 200 MG tablet Take 600-800 mg by mouth every 6 (six) hours as needed for headache or moderate pain.     levothyroxine (SYNTHROID) 125 MCG tablet Take by mouth.     metoprolol succinate (TOPROL XL) 25 MG 24 hr tablet Take 1 tablet (25 mg total) by mouth daily. Take with or immediately following a meal. 30 tablet 11   oxymetazoline (AFRIN) 0.05 % nasal spray Place 1-2 sprays into both nostrils 2 (two) times daily as needed for congestion.     traMADol (ULTRAM) 50 MG tablet      valACYclovir (VALTREX) 500 MG tablet Take 500 mg by mouth daily as needed (Herpes Virus).     busPIRone (BUSPAR) 10 MG tablet 1 bid 60 tablet 4   clonazePAM (KLONOPIN) 0.5 MG tablet 1 bid  1  prn 70 tablet 4   zolpidem (AMBIEN) 10 MG tablet Take 1 tablet (10 mg total) by mouth at bedtime. 30 tablet 4   No current facility-administered medications for this visit.    Lab Results: No results found for this or any previous visit (from the past 48 hour(s)).  Blood Alcohol level:  No results found for: "ETH"  Physical Findings: AIMS:  , ,  ,  ,    CIWA:    COWS:     Musculoskeletal: Strength & Muscle Tone: within normal limits Gait & Station: normal Patient leans: N/A  Psychiatric Specialty Exam: ROS  Blood pressure (!) 145/81, pulse 98,  height $Remov'6\' 1"'DtQfXX$  (1.854 m), weight 187 lb (84.8 kg).Body mass index is 24.67 kg/m.  General Appearance: Casual  Eye Contact::  Good  Speech:  Clear and Coherent  Volume:  Normal  Mood:mild depression  Affect:  Congruent  Thought Process:  Coherent  Orientation:  Full (Time, Place, and Person)  Thought Content:  WDL  Suicidal Thoughts:  No  Homicidal Thoughts:  No  Memory:  NA  Judgement:  Good  Insight:  Fair  Psychomotor Activity:  Normal  Concentration:  Good  Recall:  Good  Fund of Knowledge:Good  Language: Fair  Akathisia:  No  Handed:  Right  AIMS (if indicated):     Assets:  Desire for Improvement  ADL's:  Intact  Cognition: WNL  Sleep:      Treatment Plan Summary: 10/15/2021,    Adjustment disorder with an anxious mood and depression.  Presently the patient will continue taking Klonopin 0.5 mg twice daily with 1 as needed.  Will get 70 pills every month.  The patient also takes BuSpar 10 mg twice daily.  The patient says that he takes that much and has adverse side effects.  This is curious.  The side effects do not make sense for BuSpar.  He says he has trouble walking.  Nonetheless he claims BuSpar is actually very helpful.  This is kind of an inconsistent presentation of BuSpar.  I will not change any of his other medicines and I would only consider increasing his BuSpar for anxiety.  Right now he only takes a 10 mg pill.  The most important intervention however is that he is in therapy with Mr. Mikki Santee.  Patient is not suicidal.  He drinks no alcohol and uses no drugs.  I believe he is stable and at his baseline.

## 2021-10-20 ENCOUNTER — Ambulatory Visit
Admission: RE | Admit: 2021-10-20 | Discharge: 2021-10-20 | Disposition: A | Discharge status: Home / Self Care | Payer: Medicare Other | Source: Ambulatory Visit | Attending: Orthopedic Surgery | Admitting: Orthopedic Surgery

## 2021-10-20 DIAGNOSIS — G8929 Other chronic pain: Secondary | ICD-10-CM

## 2021-10-25 ENCOUNTER — Other Ambulatory Visit (HOSPITAL_COMMUNITY): Payer: Managed Care, Other (non HMO)

## 2021-10-25 DIAGNOSIS — I10 Essential (primary) hypertension: Secondary | ICD-10-CM | POA: Diagnosis not present

## 2021-10-28 ENCOUNTER — Telehealth: Payer: Self-pay | Admitting: Orthopedic Surgery

## 2021-10-28 NOTE — Telephone Encounter (Signed)
Returned patient's call this afternoon from voice message earlier today, reached voice mail, left message regarding follow up for results of MRI; states was done Sunday, 10/20/21.

## 2021-10-29 ENCOUNTER — Telehealth: Payer: Self-pay | Admitting: Orthopedic Surgery

## 2021-10-29 DIAGNOSIS — F422 Mixed obsessional thoughts and acts: Secondary | ICD-10-CM | POA: Diagnosis not present

## 2021-10-29 DIAGNOSIS — F341 Dysthymic disorder: Secondary | ICD-10-CM | POA: Diagnosis not present

## 2021-10-29 NOTE — Telephone Encounter (Signed)
No new message

## 2021-11-01 ENCOUNTER — Ambulatory Visit (INDEPENDENT_AMBULATORY_CARE_PROVIDER_SITE_OTHER): Payer: Medicare Other | Admitting: Orthopedic Surgery

## 2021-11-01 ENCOUNTER — Encounter: Payer: Self-pay | Admitting: Orthopedic Surgery

## 2021-11-01 DIAGNOSIS — M48061 Spinal stenosis, lumbar region without neurogenic claudication: Secondary | ICD-10-CM | POA: Diagnosis not present

## 2021-11-01 DIAGNOSIS — G8929 Other chronic pain: Secondary | ICD-10-CM | POA: Diagnosis not present

## 2021-11-01 DIAGNOSIS — M5442 Lumbago with sciatica, left side: Secondary | ICD-10-CM | POA: Diagnosis not present

## 2021-11-01 NOTE — Progress Notes (Addendum)
Chief Complaint  Patient presents with   Knee Pain    LT MRI review   71 year old male with continued left knee pain catching giving way some times feels like it is hyperextending  MRI review  I reviewed the MRI with the patient he has a significant amount of arthritis all 3 compartments this does not match his knee x-ray  I do not feel he is a knee replacement candidate  I read his MRI and my interpretation is the following  His MRI shows 3 compartment disease questionable tear of the lateral and medial meniscus  He also has spinal stenosis  He has back pain bilateral leg pain bilateral proximal weakness  He was seen by [place] [facility] would like a second opinion  His back may be his worst problem.  He is definitely at risk for falls, progressive worsening of his disease.  His knee also may need further treatment with arthroscopic surgery I would not replace the knee.  Recommend [facility]  Encounter Diagnoses  Name Primary?   Chronic bilateral low back pain with left-sided sciatica Yes   Spinal stenosis of lumbar region without neurogenic claudication

## 2021-11-13 ENCOUNTER — Inpatient Hospital Stay: Payer: Medicare Other | Attending: Hematology

## 2021-11-13 DIAGNOSIS — Z23 Encounter for immunization: Secondary | ICD-10-CM | POA: Diagnosis not present

## 2021-11-13 DIAGNOSIS — C8330 Diffuse large B-cell lymphoma, unspecified site: Secondary | ICD-10-CM | POA: Diagnosis not present

## 2021-11-13 DIAGNOSIS — Z9221 Personal history of antineoplastic chemotherapy: Secondary | ICD-10-CM | POA: Diagnosis not present

## 2021-11-13 LAB — CBC WITH DIFFERENTIAL/PLATELET
Abs Immature Granulocytes: 0.47 10*3/uL — ABNORMAL HIGH (ref 0.00–0.07)
Basophils Absolute: 0.1 10*3/uL (ref 0.0–0.1)
Basophils Relative: 1 %
Eosinophils Absolute: 0 10*3/uL (ref 0.0–0.5)
Eosinophils Relative: 0 %
HCT: 42 % (ref 39.0–52.0)
Hemoglobin: 15.2 g/dL (ref 13.0–17.0)
Immature Granulocytes: 5 %
Lymphocytes Relative: 19 %
Lymphs Abs: 1.7 10*3/uL (ref 0.7–4.0)
MCH: 34.1 pg — ABNORMAL HIGH (ref 26.0–34.0)
MCHC: 36.2 g/dL — ABNORMAL HIGH (ref 30.0–36.0)
MCV: 94.2 fL (ref 80.0–100.0)
Monocytes Absolute: 0.6 10*3/uL (ref 0.1–1.0)
Monocytes Relative: 7 %
Neutro Abs: 6.1 10*3/uL (ref 1.7–7.7)
Neutrophils Relative %: 68 %
Platelets: 255 10*3/uL (ref 150–400)
RBC: 4.46 MIL/uL (ref 4.22–5.81)
RDW: 13.7 % (ref 11.5–15.5)
WBC: 9 10*3/uL (ref 4.0–10.5)
nRBC: 0 % (ref 0.0–0.2)

## 2021-11-13 LAB — COMPREHENSIVE METABOLIC PANEL
ALT: 32 U/L (ref 0–44)
AST: 24 U/L (ref 15–41)
Albumin: 4 g/dL (ref 3.5–5.0)
Alkaline Phosphatase: 38 U/L (ref 38–126)
Anion gap: 9 (ref 5–15)
BUN: 22 mg/dL (ref 8–23)
CO2: 22 mmol/L (ref 22–32)
Calcium: 9 mg/dL (ref 8.9–10.3)
Chloride: 103 mmol/L (ref 98–111)
Creatinine, Ser: 0.98 mg/dL (ref 0.61–1.24)
GFR, Estimated: >60 mL/min (ref >60)
Glucose, Bld: 122 mg/dL — ABNORMAL HIGH (ref 70–99)
Potassium: 3.7 mmol/L (ref 3.5–5.1)
Sodium: 134 mmol/L — ABNORMAL LOW (ref 135–145)
Total Bilirubin: 1.1 mg/dL (ref 0.3–1.2)
Total Protein: 6.7 g/dL (ref 6.5–8.1)

## 2021-11-13 LAB — LACTATE DEHYDROGENASE: LDH: 166 U/L (ref 98–192)

## 2021-11-20 ENCOUNTER — Inpatient Hospital Stay (HOSPITAL_BASED_OUTPATIENT_CLINIC_OR_DEPARTMENT_OTHER): Payer: Medicare Other | Admitting: Hematology

## 2021-11-20 ENCOUNTER — Encounter: Payer: Self-pay | Admitting: Hematology

## 2021-11-20 ENCOUNTER — Inpatient Hospital Stay: Payer: Medicare Other

## 2021-11-20 VITALS — BP 153/79 | HR 72 | Temp 97.9°F | Resp 16 | Wt 194.3 lb

## 2021-11-20 DIAGNOSIS — Z9221 Personal history of antineoplastic chemotherapy: Secondary | ICD-10-CM | POA: Diagnosis not present

## 2021-11-20 DIAGNOSIS — C8330 Diffuse large B-cell lymphoma, unspecified site: Secondary | ICD-10-CM | POA: Diagnosis not present

## 2021-11-20 DIAGNOSIS — Z23 Encounter for immunization: Secondary | ICD-10-CM

## 2021-11-20 MED ORDER — INFLUENZA VAC A&B SA ADJ QUAD 0.5 ML IM PRSY: 0.5000 mL | PREFILLED_SYRINGE | Freq: Once | INTRAMUSCULAR | Status: DC

## 2021-11-20 MED ORDER — INFLUENZA VAC A&B SA ADJ QUAD 0.5 ML IM PRSY
0.5000 mL | PREFILLED_SYRINGE | Freq: Once | INTRAMUSCULAR | Status: AC
  Administered 2021-11-20: 0.5 mL via INTRAMUSCULAR
  Filled 2021-11-20: qty 0.5

## 2021-11-20 NOTE — Patient Instructions (Signed)

## 2021-11-20 NOTE — Patient Instructions (Signed)
[facility]  Discharge Instructions  You were seen and examined today by Dr. Katragadda.  Dr. Katragadda discussed your most recent lab work which revealed that everything looks good.  Follow-up as scheduled in 1 year.    Thank you for choosing [facility] to provide your oncology and hematology care.   To afford each patient quality time with our provider, please arrive at least 15 minutes before your scheduled appointment time. You may need to reschedule your appointment if you arrive late (10 or more minutes). Arriving late affects you and other patients whose appointments are after yours.  Also, if you miss three or more appointments without notifying the office, you may be dismissed from the clinic at the provider's discretion.    Again, thank you for choosing [facility].  Our hope is that these requests will decrease the amount of time that you wait before being seen by our physicians.   If you have a lab appointment with the Cancer Center please come in thru the Main Entrance and check in at the main information desk.           _____________________________________________________________  Should you have questions after your visit to [facility], please contact our office at [phone] and follow the prompts.  Our office hours are 8:00 a.m. to 4:30 p.m. Monday - Thursday and 8:00 a.m. to 2:30 p.m. Friday.  Please note that voicemails left after 4:00 p.m. may not be returned until the following business day.  We are closed weekends and all major holidays.  You do have access to a nurse 24-7, just call the main number to the clinic [phone] and do not press any options, hold on the line and a nurse will answer the phone.    For prescription refill requests, have your pharmacy contact our office and allow 72 hours.    Masks are optional in the cancer centers. If you would like for your care team  to wear a mask while they are taking care of you, please let them know. You may have one support person who is at least 73 years old accompany you for your appointments.  

## 2021-11-20 NOTE — Progress Notes (Unsigned)
[facility] [address], [place] [address]   CLINIC:  Medical Oncology/Hematology  PCP:  Kirstie Peri, MD [address] [place] [address] [phone]   REASON FOR VISIT:  Follow-up for  DLBCL  PRIOR THERAPY: Chemotherapy with 6 cycles at [facility] completed on 10/27/2013   NGS Results: not done  CURRENT THERAPY: surveillance  BRIEF ONCOLOGIC HISTORY:  Oncology History  DLBCL (diffuse large B cell lymphoma) (HCC)  07/07/2013 Initial Biopsy   CT guided right iliac bone marrow aspiration and core biopsy.   07/07/2013 Pathology Results   Bone Marrow Flow Cytometry - PREDOMINANCE OF T-LYMPHOCYTES WITH NONSPECIFIC REVERSAL OF THE CD4:CD8 RATIO. - NO MONOCLONAL B-CELL POPULATION IDENTIFIED. Bone Marrow, Aspirate,Biopsy, and Clot, right iliac - SLIGHTLY HYPERCELLULAR BONE MARROW FOR AGE WITH TRILINEAGE HEMATOPOIESIS. - NEGATIVE FOR LYMPHOMA - SEE COMMENT. PERIPHERAL BLOOD: - NORMOCYTIC-NORMOCHROMIC ANEMIA. - NEUTROPHILIC LEFT SHIFT.   07/13/2013 - 10/27/2013 Chemotherapy   R-CHOP x 6 cycles by Dr. Ubaldo Glassing at [facility]    09/01/2013 PET scan   Minimal metabolic activity associated with the splenic lesion. Otherwise complete response to chemotherapy. No residual hypermetabolic lymph nodes in the skullbase to thigh PET scan. Marked reduction in  lingular consolidation   09/01/2013 PET scan   1. Minimal metabolic activity associated with the splenic lesion. Otherwise complete response to chemotherapy. 1. No residual hypermetabolic lymph nodes in the skullbase to thigh PET scan.   2. Marked reduction in  lingular consolidation   11/28/2013 PET scan   1. Today's study is very similar to prior study from 09/01/2013. Specifically, the ill-defined low-attenuation lesion in the anterior aspect of the spleen is slightly smaller, currently measuring 2.1 x 2.3 cm, and continues to demonstrate some very low-level metabolic activity (SUVmax = 2.9). 2. There is  a persistent mass-like opacity in the posterior aspect of the left upper lobe, which also appears slightly smaller than the prior examination. This too demonstrates some low-level metabolic activity (SUVmax = 2.7). While this may simply represent a resolving post infectious or inflammatory scar, if there is any clinical concern that the opacity on the original study from 06/09/2013 that this was in fact a lymphomatous infiltrate, this could represent a residual focus of disease. 3. No new foci of disease noted in the neck, chest, abdomen or pelvis. 4. Atherosclerosis, including left main and 3 vessel coronary artery disease. Please note that although the presence of coronary artery calcium documents the presence of coronary artery disease, the severity of this disease and any potential stenosis cannot be assessed on this non-gated CT examination. Assessment for potential risk factor modification, dietary therapy or pharmacologic therapy may be warranted, if clinically indicated. 5. Mild cardiomegaly.   07/17/2015 Initial Diagnosis   DLBCL (diffuse large B cell lymphoma) (HCC)   10/17/2015 Imaging   CT CAP- Stable exam. No evidence for lymphadenopathy in the chest, abdomen, or pelvis. No new or progressive interval findings. 2. 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     CANCER STAGING:  Cancer Staging  No matching staging information was found for the patient.  INTERVAL HISTORY:  Mr. Arish Redner III, a 71 y.o. male, seen for follow-up of large B-cell lymphoma.  Denies any fevers, night night sweats or weight loss in the last 1 year.  Denies any infections or hospitalizations.  REVIEW OF SYSTEMS:  Review of Systems  Constitutional:  Negative for appetite change.  Gastrointestinal:  Positive for constipation.  Psychiatric/Behavioral:  Positive for  sleep disturbance.   All other systems reviewed and are negative.   PAST MEDICAL/SURGICAL HISTORY:   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   Environmental allergies    History of blood transfusion    History of bronchitis    History of chemotherapy    Hypertension    Hypothyroidism    Nocturia    Peripheral vascular disease (HCC)    Urinary frequency    Past Surgical History:  Procedure Laterality Date   BONE MARROW BIOPSY     COLONOSCOPY N/A 08/12/2018   Procedure: COLONOSCOPY;  Surgeon: Malissa Hippo, MD;  [facility];  Service: Endoscopy;  Laterality: N/A;  1030   HERNIA REPAIR     umblical times 2; inguinal (left)    IR REMOVAL TUN ACCESS W/ PORT W/O FL MOD SED  07/08/2016   POLYPECTOMY  08/12/2018   Procedure: POLYPECTOMY;  Surgeon: Malissa Hippo, MD;  [facility];  Service: Endoscopy;;  colon   port a cath placement     RHINOPLASTY      SOCIAL HISTORY:  Social History   Socioeconomic History   Marital status: Divorced    Spouse name: Not on file   Number of children: Not on file   Years of education: Not on file   Highest education level: Not on file  Occupational History   Not on file  Tobacco Use   Smoking status: Never   Smokeless tobacco: Never  Vaping Use   Vaping Use: Never used  Substance and Sexual Activity   Alcohol use: Yes    Alcohol/week: 6.0 standard drinks of alcohol    Types: 6 Cans of beer per week    Comment: beer occas   Drug use: No    Frequency: 2.0 times per week    Types: Marijuana    Comment: smokes daily for thirty years. currently takes one puff   Sexual activity: Yes    Partners: Female    Birth control/protection: Condom  Other Topics Concern   Not on file  Social History Narrative   Not on file   Social Determinants of Health   Financial Resource Strain: Not on file  Food Insecurity: Not on file  Transportation Needs: Not on file  Physical  Activity: Not on file  Stress: Not on file  Social Connections: Not on file  Intimate Partner Violence: Not on file    FAMILY HISTORY:  Family History  Problem Relation Age of Onset   Alcohol abuse Father    Depression Sister    Anxiety disorder Sister    Depression Sister    Anxiety disorder Sister     CURRENT MEDICATIONS:  Current Outpatient Medications  Medication Sig Dispense Refill   acetaminophen (TYLENOL) 500 MG tablet Take 1,000 mg by mouth every 6 (six) hours as needed for mild pain.     busPIRone (BUSPAR) 10 MG tablet 1 bid 60 tablet 4   Ca Carbonate-Mag Hydroxide (ROLAIDS PO) Take 2 tablets by mouth daily as needed (heartburn).     Carboxymethylcellul-Glycerin (LUBRICATING EYE DROPS OP) Place 1 drop into both eyes daily as needed (dry eyes).     celecoxib (CELEBREX) 200 MG capsule Take 200 mg by mouth daily.     clonazePAM (KLONOPIN) 0.5 MG tablet 1 bid  1  prn 70 tablet 4   diphenhydrAMINE (BENADRYL) 25 mg capsule Take 25 mg by mouth at bedtime as needed for allergies or sleep.      hydrochlorothiazide (HYDRODIURIL) 25 MG tablet Take 1 tablet (25 mg total) by mouth daily. 30 tablet 3   hydrocortisone cream 1 % Apply 1 application topically daily as needed for itching.     ibuprofen (ADVIL) 200 MG tablet Take 600-800 mg by mouth every 6 (six) hours as needed for headache or moderate pain.     levothyroxine (SYNTHROID) 125 MCG tablet Take by mouth.     metoprolol succinate (TOPROL XL) 25 MG 24 hr tablet Take 1 tablet (25 mg total) by mouth daily. Take with or immediately following a meal. 30 tablet 11   oxymetazoline (AFRIN) 0.05 % nasal spray Place 1-2 sprays into both nostrils 2 (two) times daily as needed for congestion.     traMADol (ULTRAM) 50 MG tablet      valACYclovir (VALTREX) 500 MG tablet Take 500 mg by mouth daily as needed (Herpes Virus).     zolpidem (AMBIEN) 10 MG tablet Take 1 tablet (10 mg total) by mouth at bedtime. 30 tablet 4   Current  Facility-Administered Medications  Medication Dose Route Frequency Provider Last Rate Last Admin   influenza vaccine adjuvanted (FLUAD) injection 0.5 mL  0.5 mL Intramuscular Once Derek Jack, MD        ALLERGIES:  Allergies  Allergen Reactions   Flomax [Tamsulosin Hcl] Swelling    congestion     PHYSICAL EXAM:  Performance status (ECOG): 1 - Symptomatic but completely ambulatory  Vitals:   11/20/21 1520  BP: (!) 153/79  Pulse: 72  Resp: 16  Temp: 97.9 F (36.6 C)  SpO2: 97%   Wt Readings from Last 3 Encounters:  11/20/21 194 lb 4.8 oz (88.1 kg)  10/03/21 184 lb 2 oz (83.5 kg)  08/07/21 185 lb (83.9 kg)   Physical Exam Vitals reviewed.  Constitutional:      Appearance: Normal appearance.  Cardiovascular:     Rate and Rhythm: Normal rate and regular rhythm.     Pulses: Normal pulses.     Heart sounds: Normal heart sounds.  Pulmonary:     Effort: Pulmonary effort is normal.     Breath sounds: Normal breath sounds.  Abdominal:     Palpations: Abdomen is soft. There is no hepatomegaly, splenomegaly or mass.     Tenderness: There is no abdominal tenderness.  Musculoskeletal:     Right lower leg: No edema.     Left lower leg: No edema.  Lymphadenopathy:     Cervical: No cervical adenopathy.     Right cervical: No superficial cervical adenopathy.    Left cervical: No superficial cervical adenopathy.     Upper Body:     Right upper body: No supraclavicular, axillary or pectoral adenopathy.     Left upper body: No supraclavicular, axillary or pectoral adenopathy.  Neurological:     General: No focal deficit present.     Mental Status: He is alert and oriented to person, place, and time.  Psychiatric:        Mood and Affect: Mood normal.        Behavior: Behavior normal.      LABORATORY DATA:  I have reviewed the labs as listed.     Latest Ref Rng & Units 11/13/2021    2:45 PM 11/06/2020    1:57 PM 11/03/2019   12:46 PM  CBC  WBC 4.0 - 10.5 K/uL 9.0   6.6  9.1   Hemoglobin 13.0 - 17.0 g/dL 15.2  15.1  15.0   Hematocrit 39.0 - 52.0 % 42.0  42.6  41.9   Platelets 150 - 400 K/uL 255  228  262       Latest Ref Rng & Units 11/13/2021    2:45 PM 11/06/2020    1:57 PM 11/03/2019   12:46 PM  CMP  Glucose 70 - 99 mg/dL 122  120  103   BUN 8 - 23 mg/dL $Remove'22  16  22   'lsyXIQu$ Creatinine 0.61 - 1.24 mg/dL 0.98  0.82  0.75   Sodium 135 - 145 mmol/L 134  134  133   Potassium 3.5 - 5.1 mmol/L 3.7  4.0  4.1   Chloride 98 - 111 mmol/L 103  104  100   CO2 22 - 32 mmol/L $RemoveB'22  24  23   'AZnIEBnz$ Calcium 8.9 - 10.3 mg/dL 9.0  8.8  9.2   Total Protein 6.5 - 8.1 g/dL 6.7  7.4  7.3   Total Bilirubin 0.3 - 1.2 mg/dL 1.1  1.2  1.2   Alkaline Phos 38 - 126 U/L 38  46  34   AST 15 - 41 U/L $Remo'24  29  21   'qhVIl$ ALT 0 - 44 U/L 32  25  27     DIAGNOSTIC IMAGING:  I have independently reviewed the scans and discussed with the patient. No results found.   ASSESSMENT:  1.  Stage IVB DLBCL: - Presentation with night sweats, 40 pound weight loss, nausea. -Treated with 6 cycles of chemotherapy at [facility], completed on 10/27/2013.   PLAN:  1.  Stage IVB DLBCL: - He does not report any night sweats, fevers or weight loss. - Physical examination did not reveal any palpable adenopathy or splenomegaly. - Reviewed labs from 11/13/2021 which showed normal LDH, LFTs and CBC. - CT angiogram on 08/05/2021 did not show any adenopathy in the chest. - RTC 1 year for follow-up with repeat labs.   Orders placed this encounter:  Orders Placed This Encounter  Procedures   CBC with Differential/Platelet   Comprehensive metabolic panel   Lactate dehydrogenase      Derek Jack, MD [facility] [phone]

## 2021-11-20 NOTE — Progress Notes (Signed)
Flu vaccine given in R Deltoid.  Patient tolerated well and was discharged to home ambulatory in stable condition.

## 2021-11-25 DIAGNOSIS — I10 Essential (primary) hypertension: Secondary | ICD-10-CM | POA: Diagnosis not present

## 2021-12-04 ENCOUNTER — Telehealth (HOSPITAL_COMMUNITY): Payer: Self-pay

## 2021-12-04 NOTE — Telephone Encounter (Signed)
Medication management - Telephone call with patient, after he left a message stating [facility] did not have his most recent Zolpidem order, provided by Dr. Casimiro Needle on 10/15/21 + 4 refills. Informed this nurse called and spoke to Armond Hang, pharmacist at [facility] to provide that order per Dr. Karen Chafe instructions.  Patient to call back if any issues filling his prescribed Zolpidem.

## 2021-12-25 DIAGNOSIS — I10 Essential (primary) hypertension: Secondary | ICD-10-CM | POA: Diagnosis not present

## 2021-12-31 DIAGNOSIS — I10 Essential (primary) hypertension: Secondary | ICD-10-CM | POA: Diagnosis not present

## 2021-12-31 DIAGNOSIS — H6992 Unspecified Eustachian tube disorder, left ear: Secondary | ICD-10-CM | POA: Diagnosis not present

## 2021-12-31 DIAGNOSIS — C8590 Non-Hodgkin lymphoma, unspecified, unspecified site: Secondary | ICD-10-CM | POA: Diagnosis not present

## 2021-12-31 DIAGNOSIS — Z299 Encounter for prophylactic measures, unspecified: Secondary | ICD-10-CM | POA: Diagnosis not present

## 2021-12-31 DIAGNOSIS — Z6826 Body mass index (BMI) 26.0-26.9, adult: Secondary | ICD-10-CM | POA: Diagnosis not present

## 2022-01-09 ENCOUNTER — Telehealth: Payer: Self-pay | Admitting: Orthopedic Surgery

## 2022-01-09 NOTE — Telephone Encounter (Signed)
I spoke to him previously advised him to get records sent over. He states he called [facility] and he signed a release.   States he was called and was told Jules Schick would call him about a second opinion, he states he still has not heard anything back, he spoke to her again today.   He still does not know if Dr Rolena Infante will see him or not, since Records are still in review  I told him I will call over there to ask   If not, we may need to get things rolling for him with a spine surgeon at a large center such as [facility].

## 2022-01-09 NOTE — Telephone Encounter (Signed)
Patient called, stated that Dr. Aline Brochure referred him to [facility] for a second opinion.  He still hasn't been scheduled and hasn't heard anything.  Please call the patient regarding this.  Pt's # [phone]

## 2022-01-09 NOTE — Telephone Encounter (Signed)
Janine with [facility] will check on the referral and update in proficient.

## 2022-01-24 DIAGNOSIS — I10 Essential (primary) hypertension: Secondary | ICD-10-CM | POA: Diagnosis not present

## 2022-02-03 NOTE — Telephone Encounter (Signed)
Patient seen 01/29/22 at [facility] per [place].

## 2022-02-18 ENCOUNTER — Ambulatory Visit (HOSPITAL_BASED_OUTPATIENT_CLINIC_OR_DEPARTMENT_OTHER): Payer: Medicare (Managed Care) | Admitting: Psychiatry

## 2022-02-18 DIAGNOSIS — F4323 Adjustment disorder with mixed anxiety and depressed mood: Secondary | ICD-10-CM | POA: Diagnosis not present

## 2022-02-18 MED ORDER — ZOLPIDEM TARTRATE 10 MG PO TABS: 10.0000 mg | ORAL_TABLET | Freq: Every day | ORAL | 4 refills | Status: DC

## 2022-02-18 MED ORDER — CLONAZEPAM 0.5 MG PO TABS: ORAL_TABLET | ORAL | 4 refills | Status: DC

## 2022-02-18 NOTE — Progress Notes (Signed)
[name], male   DOB: [birth year], 72 y.o.   MRN: [id number] [facility] MD Progress Note  02/18/2022 2:25 PM Douglas Bryant  MRN:  [id number] Subjective: Lethargic Principal Problem: Dysthymic disorder   Today the patient is at his baseline.  He has 2 sisters who are not well.  The first 1 is named Douglas Bryant who is a younger 1 who has a malignancy.  She has a bone marrow problem and has had a bone marrow transplant.  Apparently it has not worked out.  The patient possibly takes her from their home and eating to the cancer center here a couple times a week.  Patient acknowledges that his Sister Douglas Bryant is dying.  Fortunately she has a husband is very supportive.  Patient has another sister who he lives with who recently had a stroke.  She has been through rehab and now is back home.  The patient describes her as an invalid.  She is barely able to do very much at all.  She cannot walk.  He is to give her total care just about.  The patient himself has had 7 falls over the last year.  He uses a cane and he has physical therapy all set up for him.  The issues are around balance and complaint of a strange feeling on the left side of his head.  Overall though the patient's depression is chronic and stable.  He does not complain much about anxiety.  He takes his BuSpar on a as needed basis.  He takes his Klonopin regularly.   Past Surgical History:  Procedure Laterality Date   BONE MARROW BIOPSY     COLONOSCOPY N/A 08/12/2018   Procedure: COLONOSCOPY;  Surgeon: [name], MD;  [facility];  Service: Endoscopy;  Laterality: N/A;  1030   HERNIA REPAIR     umblical times 2; inguinal (left)    IR REMOVAL TUN ACCESS W/ PORT W/O FL MOD SED  07/08/2016   POLYPECTOMY  08/12/2018   Procedure: POLYPECTOMY;  Surgeon: [name], MD;  [facility];  Service: Endoscopy;;  colon   port a cath placement     RHINOPLASTY     Family History:  Family History  Problem  Relation Age of Onset   Alcohol abuse Father    Depression Sister    Anxiety disorder Sister    Depression Sister    Anxiety disorder Sister    Family Psychiatric  History:  Social History:  Social History   Substance and Sexual Activity  Alcohol Use Yes   Alcohol/week: 6.0 standard drinks of alcohol   Types: 6 Cans of beer per week   Comment: beer occas     Social History   Substance and Sexual Activity  Drug Use No   Frequency: 2.0 times per week   Types: Marijuana   Comment: smokes daily for thirty years. currently takes one puff    Social History   Socioeconomic History   Marital status: Divorced    Spouse name: Not on file   Number of children: Not on file   Years of education: Not on file   Highest education level: Not on file  Occupational History   Not on file  Tobacco Use   Smoking status: Never   Smokeless tobacco: Never  Vaping Use   Vaping Use: Never used  Substance and Sexual Activity   Alcohol use: Yes    Alcohol/week: 6.0 standard drinks of alcohol  Types: 6 Cans of beer per week    Comment: beer occas   Drug use: No    Frequency: 2.0 times per week    Types: Marijuana    Comment: smokes daily for thirty years. currently takes one puff   Sexual activity: Yes    Partners: Female    Birth control/protection: Condom  Other Topics Concern   Not on file  Social History Narrative   Not on file   Social Determinants of Health   Financial Resource Strain: Not on file  Food Insecurity: Not on file  Transportation Needs: Not on file  Physical Activity: Not on file  Stress: Not on file  Social Connections: Not on file   Additional Social History:                         Sleep: Fair  Appetite:  Good  Current Medications: Current Outpatient Medications  Medication Sig Dispense Refill   acetaminophen (TYLENOL) 500 MG tablet Take 1,000 mg by mouth every 6 (six) hours as needed for mild pain.     busPIRone (BUSPAR) 10 MG tablet 1  bid 60 tablet 4   Ca Carbonate-Mag Hydroxide (ROLAIDS PO) Take 2 tablets by mouth daily as needed (heartburn).     Carboxymethylcellul-Glycerin (LUBRICATING EYE DROPS OP) Place 1 drop into both eyes daily as needed (dry eyes).     celecoxib (CELEBREX) 200 MG capsule Take 200 mg by mouth daily.     clonazePAM (KLONOPIN) 0.5 MG tablet 1 bid  1  prn 70 tablet 4   diphenhydrAMINE (BENADRYL) 25 mg capsule Take 25 mg by mouth at bedtime as needed for allergies or sleep.      hydrochlorothiazide (HYDRODIURIL) 25 MG tablet Take 1 tablet (25 mg total) by mouth daily. 30 tablet 3   hydrocortisone cream 1 % Apply 1 application topically daily as needed for itching.     ibuprofen (ADVIL) 200 MG tablet Take 600-800 mg by mouth every 6 (six) hours as needed for headache or moderate pain.     levothyroxine (SYNTHROID) 125 MCG tablet Take by mouth.     metoprolol succinate (TOPROL XL) 25 MG 24 hr tablet Take 1 tablet (25 mg total) by mouth daily. Take with or immediately following a meal. 30 tablet 11   oxymetazoline (AFRIN) 0.05 % nasal spray Place 1-2 sprays into both nostrils 2 (two) times daily as needed for congestion.     traMADol (ULTRAM) 50 MG tablet      valACYclovir (VALTREX) 500 MG tablet Take 500 mg by mouth daily as needed (Herpes Virus).     zolpidem (AMBIEN) 10 MG tablet Take 1 tablet (10 mg total) by mouth at bedtime. 30 tablet 4   No current facility-administered medications for this visit.    Lab Results: No results found for this or any previous visit (from the past 48 hour(s)).  Blood Alcohol level:  No results found for: "ETH"  Physical Findings: AIMS:  , ,  ,  ,    CIWA:    COWS:     Musculoskeletal: Strength & Muscle Tone: within normal limits Gait & Station: normal Patient leans: N/A  Psychiatric Specialty Exam: ROS  There were no vitals taken for this visit.There is no height or weight on file to calculate BMI.  General Appearance: Casual  Eye Contact::  Good  Speech:   Clear and Coherent  Volume:  Normal  Mood:mild depression  Affect:  Congruent  Thought  Process:  Coherent  Orientation:  Full (Time, Place, and Person)  Thought Content:  WDL  Suicidal Thoughts:  No  Homicidal Thoughts:  No  Memory:  NA  Judgement:  Good  Insight:  Fair  Psychomotor Activity:  Normal  Concentration:  Good  Recall:  Good  Fund of Knowledge:Good  Language: Fair  Akathisia:  No  Handed:  Right  AIMS (if indicated):     Assets:  Desire for Improvement  ADL's:  Intact  Cognition: WNL  Sleep:      Treatment Plan Summary: 02/18/2022,    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  He continues having significant psychosocial stressors in his life.  Mainly surrounding the illness of his sisters.  He will continue in Klonopin 0.5 mg twice daily and 1 extra if he needs it as needed.  He also takes BuSpar 10 mg on a as needed basis.  At this time is we will hold with his therapist Lauretta Chester.  The patient now is in the process of going through physical therapy which hopefully will help his balance.  It is my wish that he would see a neurologist to evaluate his imbalance and some issues related to his left ear.  At this time the patient is functioning fairly well.  He will return to see me in 4 months.

## 2022-02-24 DIAGNOSIS — I10 Essential (primary) hypertension: Secondary | ICD-10-CM | POA: Diagnosis not present

## 2022-03-14 DIAGNOSIS — M545 Low back pain, unspecified: Secondary | ICD-10-CM | POA: Diagnosis not present

## 2022-03-26 DIAGNOSIS — I10 Essential (primary) hypertension: Secondary | ICD-10-CM | POA: Diagnosis not present

## 2022-03-27 ENCOUNTER — Encounter: Payer: Self-pay | Admitting: Radiology

## 2022-04-26 DIAGNOSIS — I10 Essential (primary) hypertension: Secondary | ICD-10-CM | POA: Diagnosis not present

## 2022-05-15 DIAGNOSIS — M545 Low back pain, unspecified: Secondary | ICD-10-CM | POA: Diagnosis not present

## 2022-05-19 DIAGNOSIS — M545 Low back pain, unspecified: Secondary | ICD-10-CM | POA: Diagnosis not present

## 2022-05-21 DIAGNOSIS — M545 Low back pain, unspecified: Secondary | ICD-10-CM | POA: Diagnosis not present

## 2022-05-26 DIAGNOSIS — M545 Low back pain, unspecified: Secondary | ICD-10-CM | POA: Diagnosis not present

## 2022-05-27 ENCOUNTER — Ambulatory Visit (INDEPENDENT_AMBULATORY_CARE_PROVIDER_SITE_OTHER): Payer: Medicare (Managed Care) | Admitting: Diagnostic Neuroimaging

## 2022-05-27 ENCOUNTER — Telehealth: Payer: Self-pay | Admitting: Diagnostic Neuroimaging

## 2022-05-27 ENCOUNTER — Encounter: Payer: Self-pay | Admitting: Diagnostic Neuroimaging

## 2022-05-27 VITALS — BP 125/78 | HR 63 | Ht 73.0 in | Wt 186.6 lb

## 2022-05-27 DIAGNOSIS — M48062 Spinal stenosis, lumbar region with neurogenic claudication: Secondary | ICD-10-CM | POA: Diagnosis not present

## 2022-05-27 DIAGNOSIS — R42 Dizziness and giddiness: Secondary | ICD-10-CM | POA: Diagnosis not present

## 2022-05-27 DIAGNOSIS — R799 Abnormal finding of blood chemistry, unspecified: Secondary | ICD-10-CM | POA: Diagnosis not present

## 2022-05-27 DIAGNOSIS — I10 Essential (primary) hypertension: Secondary | ICD-10-CM | POA: Diagnosis not present

## 2022-05-27 DIAGNOSIS — R519 Headache, unspecified: Secondary | ICD-10-CM

## 2022-05-27 DIAGNOSIS — Z0389 Encounter for observation for other suspected diseases and conditions ruled out: Secondary | ICD-10-CM | POA: Diagnosis not present

## 2022-05-27 DIAGNOSIS — Z0001 Encounter for general adult medical examination with abnormal findings: Secondary | ICD-10-CM | POA: Diagnosis not present

## 2022-05-27 DIAGNOSIS — Z79899 Other long term (current) drug therapy: Secondary | ICD-10-CM | POA: Diagnosis not present

## 2022-05-27 NOTE — Patient Instructions (Signed)
GAIT DIFF / BALANCE ISSUES (mainly due to severe spinal stenosis in L3-4; with neurogenic claudication) - check neuropathy labs - follow up with spine surgery  LEFT SIDED HEADACHES / DIZZINESS - check MRI brain

## 2022-05-27 NOTE — Progress Notes (Signed)
[facility]  PATIENT: Douglas Bryant DOB: 1950-02-14  REFERRING CLINICIAN: Venita Lick, MD HISTORY FROM: patient  REASON FOR VISIT: new consult   HISTORICAL  CHIEF COMPLAINT:  Chief Complaint  Patient presents with   New Patient (Initial Visit)    Patient in room #6 and alone. Patient states he here to discuss his balance issues, ear problem and back pain that may cause his balance issues. Patient states he been having headaches.    HISTORY OF PRESENT ILLNESS:   72 year old male here for gait and balance difficulty.   Patient reports 5 years of progressive lower extremity leg weakness, left worse than right, associated with increasing low back pain rating to the legs.  Symptoms worse when he is standing up and walking.  Sometimes after standing and walking his neck pain will severely increased and his legs will give out.  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  Patient was referred to neurology clinic to rule out other causes of balance issues.  Patient does endorse some other symptoms including left-sided headaches and generalized dizziness sensations.  These have been going on for the past 1 year.  No problems with arms, neck or hands.  No speech or swallowing difficulties.  No vision changes.   REVIEW OF SYSTEMS: Full 14 system review of systems performed and negative with exception of: as per HPI.  ALLERGIES: Allergies  Allergen Reactions   Flomax [Tamsulosin Hcl] Swelling    congestion     HOME MEDICATIONS: Outpatient Medications Prior to Visit  Medication Sig Dispense Refill   acetaminophen (TYLENOL) 500 MG tablet Take 1,000 mg by mouth every 6 (six) hours as needed for mild pain.     Carboxymethylcellul-Glycerin (LUBRICATING EYE DROPS OP) Place 1 drop into both eyes daily as needed (dry eyes).     clonazePAM (KLONOPIN) 0.5 MG tablet 1 bid  1  prn 70 tablet 4    diphenhydrAMINE (BENADRYL) 25 mg capsule Take 25 mg by mouth at bedtime as needed for allergies or sleep.      hydrochlorothiazide (HYDRODIURIL) 25 MG tablet Take 1 tablet (25 mg total) by mouth daily. 30 tablet 3   hydrocortisone cream 1 % Apply 1 application topically daily as needed for itching.     ibuprofen (ADVIL) 200 MG tablet Take 600-800 mg by mouth every 6 (six) hours as needed for headache or moderate pain.     metoprolol succinate (TOPROL XL) 25 MG 24 hr tablet Take 1 tablet (25 mg total) by mouth daily. Take with or immediately following a meal. 30 tablet 11   oxymetazoline (AFRIN) 0.05 % nasal spray Place 1-2 sprays into both nostrils 2 (two) times daily as needed for congestion.     traMADol (ULTRAM) 50 MG tablet      valACYclovir (VALTREX) 500 MG tablet Take 500 mg by mouth daily as needed (Herpes Virus).     zolpidem (AMBIEN) 10 MG tablet Take 1 tablet (10 mg total) by mouth at bedtime. 30 tablet 4   Ca Carbonate-Mag Hydroxide (ROLAIDS PO) Take 2 tablets by mouth daily as needed (heartburn). (Patient not taking: Reported on 05/27/2022)     busPIRone (BUSPAR) 10 MG tablet 1 bid 60 tablet 4   celecoxib (CELEBREX) 200 MG capsule Take 200 mg by mouth daily.     levothyroxine (SYNTHROID) 125 MCG tablet Take by mouth.     No facility-administered medications prior to visit.  PAST MEDICAL HISTORY: 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   Environmental allergies    History of blood transfusion    History of bronchitis    History of chemotherapy    Hypertension    Hypothyroidism    Nocturia    Peripheral vascular disease (HCC)    Urinary frequency     PAST SURGICAL HISTORY: Past Surgical History:  Procedure Laterality Date   BONE MARROW BIOPSY     COLONOSCOPY N/A 08/12/2018   Procedure: COLONOSCOPY;  Surgeon: Malissa Hippo, MD;   [facility];  Service: Endoscopy;  Laterality: N/A;  1030   HERNIA REPAIR     umblical times 2; inguinal (left)    IR REMOVAL TUN ACCESS W/ PORT W/O FL MOD SED  07/08/2016   POLYPECTOMY  08/12/2018   Procedure: POLYPECTOMY;  Surgeon: Malissa Hippo, MD;  [facility];  Service: Endoscopy;;  colon   port a cath placement     RHINOPLASTY      FAMILY HISTORY: Family History  Problem Relation Age of Onset   Alcohol abuse Father    Depression Sister    Anxiety disorder Sister    Depression Sister    Anxiety disorder Sister     SOCIAL HISTORY: Social History   Socioeconomic History   Marital status: Divorced    Spouse name: Not on file   Number of children: Not on file   Years of education: Not on file   Highest education level: Not on file  Occupational History   Not on file  Tobacco Use   Smoking status: Never   Smokeless tobacco: Never  Vaping Use   Vaping Use: Never used  Substance and Sexual Activity   Alcohol use: Yes    Alcohol/week: 6.0 standard drinks of alcohol    Types: 6 Cans of beer per week    Comment: beer occas   Drug use: No    Frequency: 2.0 times per week    Types: Marijuana    Comment: smokes daily for thirty years. currently takes one puff   Sexual activity: Yes    Partners: Female    Birth control/protection: Condom  Other Topics Concern   Not on file  Social History Narrative   Not on file   Social Determinants of Health   Financial Resource Strain: Not on file  Food Insecurity: Not on file  Transportation Needs: Not on file  Physical Activity: Not on file  Stress: Not on file  Social Connections: Not on file  Intimate Partner Violence: Not on file     PHYSICAL EXAM  GENERAL EXAM/CONSTITUTIONAL: Vitals:  Vitals:   05/27/22 0845  BP: 125/78  Pulse: 63  Weight: 186 lb 9.6 oz (84.6 kg)  Height: 6\' 1"  (1.854 m)   Body mass index is 24.62 kg/m. Wt Readings from Last 3 Encounters:  05/27/22 186 lb 9.6 oz  (84.6 kg)  11/20/21 194 lb 4.8 oz (88.1 kg)  10/03/21 184 lb 2 oz (83.5 kg)   Patient is in no distress; well developed, nourished and groomed; neck is supple  CARDIOVASCULAR: Examination of carotid arteries is normal; no carotid bruits Regular rate and rhythm, no murmurs Examination of peripheral vascular system by observation and palpation is normal  EYES: Ophthalmoscopic exam of optic discs  and posterior segments is normal; no papilledema or hemorrhages No results found.  MUSCULOSKELETAL: Gait, strength, tone, movements noted in Neurologic exam below  NEUROLOGIC: MENTAL STATUS:      No data to display         awake, alert, oriented to person, place and time recent and remote memory intact normal attention and concentration language fluent, comprehension intact, naming intact fund of knowledge appropriate  CRANIAL NERVE:  2nd - no papilledema on fundoscopic exam 2nd, 3rd, 4th, 6th - pupils equal and reactive to light, visual fields full to confrontation, extraocular muscles intact, no nystagmus 5th - facial sensation symmetric 7th - facial strength symmetric 8th - hearing intact 9th - palate elevates symmetrically, uvula midline 11th - shoulder shrug symmetric 12th - tongue protrusion midline  MOTOR:  normal bulk and tone, full strength in the BUE, BLE; EXCEPT BILATERAL DF 4; BILATERAL PF 3  SENSORY:  normal and symmetric to light touch, temperature, vibration; DECR IN LEFT LEG  COORDINATION:  finger-nose-finger, fine finger movements normal  REFLEXES:  deep tendon reflexes --> BUE 1; BLE TRACE KNEES; ABSENT ANKLES  GAIT/STATION:  WIDE BASED GAIT; CANNOT STAND ON TOES; DIFF WALKING ON HEELS; UNSTEADY GAIT     DIAGNOSTIC DATA (LABS, IMAGING, TESTING) - I reviewed patient records, labs, notes, testing and imaging myself where available.  Lab Results  Component Value Date   WBC 9.0 11/13/2021   HGB 15.2 11/13/2021   HCT 42.0 11/13/2021   MCV 94.2  11/13/2021   PLT 255 11/13/2021      Component Value Date/Time   NA 134 (L) 11/13/2021 1445   K 3.7 11/13/2021 1445   CL 103 11/13/2021 1445   CO2 22 11/13/2021 1445   GLUCOSE 122 (H) 11/13/2021 1445   BUN 22 11/13/2021 1445   CREATININE 0.98 11/13/2021 1445   CALCIUM 9.0 11/13/2021 1445   PROT 6.7 11/13/2021 1445   ALBUMIN 4.0 11/13/2021 1445   AST 24 11/13/2021 1445   ALT 32 11/13/2021 1445   ALKPHOS 38 11/13/2021 1445   BILITOT 1.1 11/13/2021 1445   GFRNONAA >60 11/13/2021 1445   GFRAA >60 10/13/2018 1147   No results found for: "CHOL", "HDL", "LDLCALC", "LDLDIRECT", "TRIG", "CHOLHDL" No results found for: "HGBA1C" No results found for: "VITAMINB12" No results found for: "TSH"   02/06/2022 MRI lumbar spine [I reviewed images myself and agree with interpretation. -VRP]  -L3-4 severe central canal stenosis with moderate to severe bilateral foraminal stenosis -L4-5 mild to moderate central canal stenosis    ASSESSMENT AND PLAN  72 y.o. year old male here with:   Dx:  1. Spinal stenosis of lumbar region with neurogenic claudication   2. Dizziness   3. Left-sided headache       PLAN:  GAIT DIFF / BALANCE ISSUES (last ~5-7 years; mainly due to severe spinal stenosis in L3-4 with neurogenic claudication) - check neuropathy labs - follow up with spine surgery for possible surgical treatments  LEFT SIDED HEADACHES / DIZZINESS (worsening x 6-12 months) - check MRI brain (rule out mass, stroke, inflamm)  Orders Placed This Encounter  Procedures   MR BRAIN W WO CONTRAST   Vitamin B12   TSH Rfx on Abnormal to Free T4   Hemoglobin A1c   Sedimentation Rate   C-reactive Protein   Return for return to referring provider, pending test results, pending if symptoms worsen or fail to improve.    Suanne Marker, MD 05/27/2022, 9:17 AM Certified in Neurology, Neurophysiology and Neuroimaging  [facility] [address], [address] [place], [place] [address] [phone]

## 2022-05-27 NOTE — Telephone Encounter (Signed)
sent to GI they obtain [facility] Berkley Harvey [phone]

## 2022-05-28 DIAGNOSIS — M545 Low back pain, unspecified: Secondary | ICD-10-CM | POA: Diagnosis not present

## 2022-05-28 LAB — T4F: T4,Free (Direct): 0.91 ng/dL (ref 0.82–1.77)

## 2022-05-28 LAB — C-REACTIVE PROTEIN: CRP: 1 mg/L (ref 0–10)

## 2022-05-28 LAB — HEMOGLOBIN A1C: Hgb A1c MFr Bld: 5.1 % (ref 4.8–5.6)

## 2022-05-28 LAB — TSH RFX ON ABNORMAL TO FREE T4: TSH: 6.81 u[IU]/mL — ABNORMAL HIGH (ref 0.450–4.500)

## 2022-05-29 DIAGNOSIS — H353132 Nonexudative age-related macular degeneration, bilateral, intermediate dry stage: Secondary | ICD-10-CM | POA: Diagnosis not present

## 2022-05-29 LAB — VITAMIN B12: Vitamin B-12: 439 pg/mL (ref 232–1245)

## 2022-05-29 LAB — HEMOGLOBIN A1C: Est. average glucose Bld gHb Est-mCnc: 100 mg/dL

## 2022-05-29 LAB — SEDIMENTATION RATE: Sed Rate: 5 mm/hr (ref 0–30)

## 2022-06-02 DIAGNOSIS — M545 Low back pain, unspecified: Secondary | ICD-10-CM | POA: Diagnosis not present

## 2022-06-04 DIAGNOSIS — M545 Low back pain, unspecified: Secondary | ICD-10-CM | POA: Diagnosis not present

## 2022-06-10 ENCOUNTER — Encounter: Payer: Self-pay | Admitting: Diagnostic Neuroimaging

## 2022-06-10 DIAGNOSIS — M545 Low back pain, unspecified: Secondary | ICD-10-CM | POA: Diagnosis not present

## 2022-06-12 DIAGNOSIS — R208 Other disturbances of skin sensation: Secondary | ICD-10-CM | POA: Diagnosis not present

## 2022-06-12 DIAGNOSIS — L298 Other pruritus: Secondary | ICD-10-CM | POA: Diagnosis not present

## 2022-06-12 DIAGNOSIS — B078 Other viral warts: Secondary | ICD-10-CM | POA: Diagnosis not present

## 2022-06-12 DIAGNOSIS — L728 Other follicular cysts of the skin and subcutaneous tissue: Secondary | ICD-10-CM | POA: Diagnosis not present

## 2022-06-12 DIAGNOSIS — L820 Inflamed seborrheic keratosis: Secondary | ICD-10-CM | POA: Diagnosis not present

## 2022-06-12 DIAGNOSIS — L538 Other specified erythematous conditions: Secondary | ICD-10-CM | POA: Diagnosis not present

## 2022-06-24 ENCOUNTER — Ambulatory Visit (HOSPITAL_BASED_OUTPATIENT_CLINIC_OR_DEPARTMENT_OTHER): Payer: Medicare (Managed Care) | Admitting: Psychiatry

## 2022-06-24 VITALS — BP 158/86 | HR 54 | Ht 73.0 in | Wt 186.0 lb

## 2022-06-24 DIAGNOSIS — F4322 Adjustment disorder with anxiety: Secondary | ICD-10-CM

## 2022-06-24 MED ORDER — ZOLPIDEM TARTRATE 10 MG PO TABS: 10.0000 mg | ORAL_TABLET | Freq: Every day | ORAL | 4 refills | Status: DC

## 2022-06-24 MED ORDER — CLONAZEPAM 0.5 MG PO TABS: ORAL_TABLET | ORAL | 4 refills | Status: DC

## 2022-06-24 NOTE — Progress Notes (Signed)
[name], male   DOB: 1950/07/23, 72 y.o.   MRN: [id number] [facility] MD Progress Note  06/24/2022 2:40 PM Douglas Bryant  MRN:  [id number] Subjective: Lethargic Principal Problem: Dysthymic disorder   Today the patient is seen at his baseline.  His Sister Harriett Sine.  His other Sister Erskine Squibb is with him lives with him and had a CVA.  She apparently is getting better.  The patient is in physical therapy.  His problems walking.  He sees a neurologist already and was diagnosed with spinal stenosis.  He is going to have an MRI in the next week or so.  Overall his emotional status is no different.  He is not anxiety and takes Klonopin 0.5 mg twice daily and 1 as needed.  We discontinued the BuSpar but he does not miss it.  He also has making any efforts to see the therapist.  The patient's affect is really quite normal.  He apparently was involved in mental health care many many years ago and has some difficult memories from it.  I do not think he has posttraumatic stress disorder and he has a significant affective disorder.  As usual he is asking for new treatments and is interested in ketamine.  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  Generally every time he is tried on anything for what I consider to be a dysthymic disorder he has side effects.  In order to being he did not have to be on an antidepressant.  He denies anhedonia.  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  He is sleeping and eating pretty well and his energy level is okay.  He has few to no vegetative symptoms.  He does have insomnia and takes Ambien which helps him a lot. Past Surgical History:  Procedure Laterality Date   BONE MARROW BIOPSY     COLONOSCOPY N/A 08/12/2018   Procedure: COLONOSCOPY;  Surgeon: Malissa Hippo, MD;  [facility];  Service: Endoscopy;  Laterality: N/A;  1030   HERNIA REPAIR      umblical times 2; inguinal (left)    IR REMOVAL TUN ACCESS W/ PORT W/O FL MOD SED  07/08/2016   POLYPECTOMY  08/12/2018   Procedure: POLYPECTOMY;  Surgeon: Malissa Hippo, MD;  [facility];  Service: Endoscopy;;  colon   port a cath placement     RHINOPLASTY     Family History:  Family History  Problem Relation Age of Onset   Alcohol abuse Father    Depression Sister    Anxiety disorder Sister    Depression Sister    Anxiety disorder Sister    Family Psychiatric  History:  Social History:  Social History   Substance and Sexual Activity  Alcohol Use Yes   Alcohol/week: 6.0 standard drinks of alcohol   Types: 6 Cans of beer per week   Comment: beer occas     Social History   Substance and Sexual Activity  Drug Use No   Frequency: 2.0 times per week   Types: Marijuana   Comment: smokes daily for thirty years. currently takes one puff    Social History   Socioeconomic History   Marital status: Divorced    Spouse name: Not on file   Number of children: Not on file   Years of education: Not on file   Highest  education level: Not on file  Occupational History   Not on file  Tobacco Use   Smoking status: Never   Smokeless tobacco: Never  Vaping Use   Vaping Use: Never used  Substance and Sexual Activity   Alcohol use: Yes    Alcohol/week: 6.0 standard drinks of alcohol    Types: 6 Cans of beer per week    Comment: beer occas   Drug use: No    Frequency: 2.0 times per week    Types: Marijuana    Comment: smokes daily for thirty years. currently takes one puff   Sexual activity: Yes    Partners: Female    Birth control/protection: Condom  Other Topics Concern   Not on file  Social History Narrative   Not on file   Social Determinants of Health   Financial Resource Strain: Not on file  Food Insecurity: Not on file  Transportation Needs: Not on file  Physical Activity: Not on file  Stress: Not on file  Social Connections: Not on file    Additional Social History:                         Sleep: Fair  Appetite:  Good  Current Medications: Current Outpatient Medications  Medication Sig Dispense Refill   acetaminophen (TYLENOL) 500 MG tablet Take 1,000 mg by mouth every 6 (six) hours as needed for mild pain.     Ca Carbonate-Mag Hydroxide (ROLAIDS PO) Take 2 tablets by mouth daily as needed (heartburn). (Patient not taking: Reported on 05/27/2022)     Carboxymethylcellul-Glycerin (LUBRICATING EYE DROPS OP) Place 1 drop into both eyes daily as needed (dry eyes).     clonazePAM (KLONOPIN) 0.5 MG tablet 1 bid  1  prn 70 tablet 4   diphenhydrAMINE (BENADRYL) 25 mg capsule Take 25 mg by mouth at bedtime as needed for allergies or sleep.      hydrochlorothiazide (HYDRODIURIL) 25 MG tablet Take 1 tablet (25 mg total) by mouth daily. 30 tablet 3   hydrocortisone cream 1 % Apply 1 application topically daily as needed for itching.     ibuprofen (ADVIL) 200 MG tablet Take 600-800 mg by mouth every 6 (six) hours as needed for headache or moderate pain.     metoprolol succinate (TOPROL XL) 25 MG 24 hr tablet Take 1 tablet (25 mg total) by mouth daily. Take with or immediately following a meal. 30 tablet 11   oxymetazoline (AFRIN) 0.05 % nasal spray Place 1-2 sprays into both nostrils 2 (two) times daily as needed for congestion.     traMADol (ULTRAM) 50 MG tablet      valACYclovir (VALTREX) 500 MG tablet Take 500 mg by mouth daily as needed (Herpes Virus).     zolpidem (AMBIEN) 10 MG tablet Take 1 tablet (10 mg total) by mouth at bedtime. 30 tablet 4   No current facility-administered medications for this visit.    Lab Results: No results found for this or any previous visit (from the past 48 hour(s)).  Blood Alcohol level:  No results found for: "ETH"  Physical Findings: AIMS:  , ,  ,  ,    CIWA:    COWS:     Musculoskeletal: Strength & Muscle Tone: within normal limits Gait & Station: normal Patient leans:  N/A  Psychiatric Specialty Exam: ROS  Blood pressure (!) 158/86, pulse (!) 54, height 6\' 1"  (1.854 m), weight 186 lb (84.4 kg), SpO2 95 %.Body mass index  is 24.54 kg/m.  General Appearance: Casual  Eye Contact::  Good  Speech:  Clear and Coherent  Volume:  Normal  Mood:mild depression  Affect:  Congruent  Thought Process:  Coherent  Orientation:  Full (Time, Place, and Person)  Thought Content:  WDL  Suicidal Thoughts:  No  Homicidal Thoughts:  No  Memory:  NA  Judgement:  Good  Insight:  Fair  Psychomotor Activity:  Normal  Concentration:  Good  Recall:  Good  Fund of Knowledge:Good  Language: Fair  Akathisia:  No  Handed:  Right  AIMS (if indicated):     Assets:  Desire for Improvement  ADL's:  Intact  Cognition: WNL  Sleep:      Treatment Plan Summary: 06/24/2022,   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  The patient has uncomplicated bereavement.  His mood is actually pretty good.  He knows hospice therapy would be available for him if he would need it.  But his diagnosis is insomnia and he takes Ambien and has a good affect.  He has an adjustment disorder and takes Klonopin 0.5 mg twice daily with 1 as needed dose well.  He will return to see me in 3 months.Marland Kitchen

## 2022-06-25 DIAGNOSIS — M545 Low back pain, unspecified: Secondary | ICD-10-CM | POA: Diagnosis not present

## 2022-06-27 DIAGNOSIS — I10 Essential (primary) hypertension: Secondary | ICD-10-CM | POA: Diagnosis not present

## 2022-06-27 DIAGNOSIS — M545 Low back pain, unspecified: Secondary | ICD-10-CM | POA: Diagnosis not present

## 2022-07-01 ENCOUNTER — Ambulatory Visit
Admission: RE | Admit: 2022-07-01 | Discharge: 2022-07-01 | Disposition: A | Discharge status: Home / Self Care | Payer: Medicare (Managed Care) | Source: Ambulatory Visit | Attending: Diagnostic Neuroimaging | Admitting: Diagnostic Neuroimaging

## 2022-07-01 DIAGNOSIS — R42 Dizziness and giddiness: Secondary | ICD-10-CM | POA: Diagnosis not present

## 2022-07-01 DIAGNOSIS — R519 Headache, unspecified: Secondary | ICD-10-CM | POA: Diagnosis not present

## 2022-07-01 MED ORDER — GADOPICLENOL 0.5 MMOL/ML IV SOLN
9.0000 mL | Freq: Once | INTRAVENOUS | Status: AC | PRN
  Administered 2022-07-01: 9 mL via INTRAVENOUS

## 2022-07-03 ENCOUNTER — Other Ambulatory Visit: Payer: Self-pay | Admitting: Thoracic Surgery (Cardiothoracic Vascular Surgery)

## 2022-07-03 DIAGNOSIS — R208 Other disturbances of skin sensation: Secondary | ICD-10-CM | POA: Diagnosis not present

## 2022-07-03 DIAGNOSIS — I7121 Aneurysm of the ascending aorta, without rupture: Secondary | ICD-10-CM

## 2022-07-03 DIAGNOSIS — C44529 Squamous cell carcinoma of skin of other part of trunk: Secondary | ICD-10-CM | POA: Diagnosis not present

## 2022-07-03 DIAGNOSIS — L728 Other follicular cysts of the skin and subcutaneous tissue: Secondary | ICD-10-CM | POA: Diagnosis not present

## 2022-07-03 DIAGNOSIS — L538 Other specified erythematous conditions: Secondary | ICD-10-CM | POA: Diagnosis not present

## 2022-07-10 DIAGNOSIS — I10 Essential (primary) hypertension: Secondary | ICD-10-CM | POA: Diagnosis not present

## 2022-07-10 DIAGNOSIS — Z299 Encounter for prophylactic measures, unspecified: Secondary | ICD-10-CM | POA: Diagnosis not present

## 2022-07-10 DIAGNOSIS — M545 Low back pain, unspecified: Secondary | ICD-10-CM | POA: Diagnosis not present

## 2022-07-16 ENCOUNTER — Telehealth: Payer: Self-pay | Admitting: Diagnostic Neuroimaging

## 2022-07-16 NOTE — Telephone Encounter (Signed)
Pt called checking on status of MRI results done on 07/01/22. Would like a call back

## 2022-07-16 NOTE — Telephone Encounter (Signed)
Contacted pt back, informed MD sent results via [facility] on 7/6. He rq results be sent via mail. I have printed it and placed in outgoing mail at front desk.  Brain MRI is good. Have a great weekend! -VRP

## 2022-07-27 ENCOUNTER — Other Ambulatory Visit: Payer: Self-pay | Admitting: Physician Assistant

## 2022-07-27 DIAGNOSIS — I10 Essential (primary) hypertension: Secondary | ICD-10-CM | POA: Diagnosis not present

## 2022-07-29 DIAGNOSIS — M545 Low back pain, unspecified: Secondary | ICD-10-CM | POA: Diagnosis not present

## 2022-07-30 DIAGNOSIS — C44529 Squamous cell carcinoma of skin of other part of trunk: Secondary | ICD-10-CM | POA: Diagnosis not present

## 2022-08-06 DIAGNOSIS — I10 Essential (primary) hypertension: Secondary | ICD-10-CM | POA: Diagnosis not present

## 2022-08-06 DIAGNOSIS — Z299 Encounter for prophylactic measures, unspecified: Secondary | ICD-10-CM | POA: Diagnosis not present

## 2022-08-06 DIAGNOSIS — H6692 Otitis media, unspecified, left ear: Secondary | ICD-10-CM | POA: Diagnosis not present

## 2022-08-11 ENCOUNTER — Other Ambulatory Visit: Payer: Medicare (Managed Care)

## 2022-08-11 ENCOUNTER — Ambulatory Visit: Payer: Medicare (Managed Care)

## 2022-08-14 ENCOUNTER — Encounter: Payer: Self-pay | Admitting: Thoracic Surgery (Cardiothoracic Vascular Surgery)

## 2022-08-18 ENCOUNTER — Ambulatory Visit: Payer: Medicare (Managed Care)

## 2022-08-18 ENCOUNTER — Inpatient Hospital Stay: Admission: RE | Admit: 2022-08-18 | Payer: Medicare (Managed Care) | Source: Ambulatory Visit

## 2022-08-21 DIAGNOSIS — H6690 Otitis media, unspecified, unspecified ear: Secondary | ICD-10-CM | POA: Diagnosis not present

## 2022-08-21 DIAGNOSIS — Z299 Encounter for prophylactic measures, unspecified: Secondary | ICD-10-CM | POA: Diagnosis not present

## 2022-08-21 DIAGNOSIS — I10 Essential (primary) hypertension: Secondary | ICD-10-CM | POA: Diagnosis not present

## 2022-08-27 DIAGNOSIS — I10 Essential (primary) hypertension: Secondary | ICD-10-CM | POA: Diagnosis not present

## 2022-09-11 DIAGNOSIS — L570 Actinic keratosis: Secondary | ICD-10-CM | POA: Diagnosis not present

## 2022-09-11 DIAGNOSIS — Z85828 Personal history of other malignant neoplasm of skin: Secondary | ICD-10-CM | POA: Diagnosis not present

## 2022-09-11 DIAGNOSIS — L853 Xerosis cutis: Secondary | ICD-10-CM | POA: Diagnosis not present

## 2022-09-11 DIAGNOSIS — D235 Other benign neoplasm of skin of trunk: Secondary | ICD-10-CM | POA: Diagnosis not present

## 2022-09-11 DIAGNOSIS — C44219 Basal cell carcinoma of skin of left ear and external auricular canal: Secondary | ICD-10-CM | POA: Diagnosis not present

## 2022-09-11 DIAGNOSIS — D485 Neoplasm of uncertain behavior of skin: Secondary | ICD-10-CM | POA: Diagnosis not present

## 2022-09-11 DIAGNOSIS — Z7189 Other specified counseling: Secondary | ICD-10-CM | POA: Diagnosis not present

## 2022-09-11 DIAGNOSIS — Z08 Encounter for follow-up examination after completed treatment for malignant neoplasm: Secondary | ICD-10-CM | POA: Diagnosis not present

## 2022-09-15 ENCOUNTER — Ambulatory Visit: Payer: Medicare (Managed Care) | Admitting: Surgical

## 2022-09-15 ENCOUNTER — Ambulatory Visit
Admission: RE | Admit: 2022-09-15 | Discharge: 2022-09-15 | Disposition: A | Discharge status: Home / Self Care | Payer: Medicare (Managed Care) | Source: Ambulatory Visit | Attending: Thoracic Surgery (Cardiothoracic Vascular Surgery) | Admitting: Thoracic Surgery (Cardiothoracic Vascular Surgery)

## 2022-09-15 VITALS — BP 150/80 | HR 57 | Resp 18 | Ht 73.0 in | Wt 188.0 lb

## 2022-09-15 DIAGNOSIS — I7121 Aneurysm of the ascending aorta, without rupture: Secondary | ICD-10-CM

## 2022-09-15 DIAGNOSIS — I719 Aortic aneurysm of unspecified site, without rupture: Secondary | ICD-10-CM | POA: Diagnosis not present

## 2022-09-15 MED ORDER — IOPAMIDOL (ISOVUE-370) INJECTION 76%
500.0000 mL | Freq: Once | INTRAVENOUS | Status: AC | PRN
  Administered 2022-09-15: 75 mL via INTRAVENOUS

## 2022-09-15 NOTE — Progress Notes (Signed)
Subjective:     [name], male    DOB: 08/11/1950, 72 y.o.   MRN: [id number]  Chief Complaint  Patient presents with   Thoracic Aortic Aneurysm    HPI Patient is in today for ongoing surveillance of his thoracic aortic aneurysm.  Last year he measured approximately 4.4 cm.  His scan done today is day has a result currently pending but to my reading this approximately 4.5 cm.  The patient appears to be asymptomatic in this regard.  He does describe some symptoms which could be laded to sleep apnea and does awaken at times with some chest discomfort.  His blood pressure for his report is under only fair control with some readings into the 130s to 150s systolic.  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   Environmental allergies    History of blood transfusion    History of bronchitis    History of chemotherapy    Hypertension    Hypothyroidism    Nocturia    Peripheral vascular disease (HCC)    Urinary frequency    Past Surgical History:  Procedure Laterality Date   BONE MARROW BIOPSY     COLONOSCOPY N/A 08/12/2018   Procedure: COLONOSCOPY;  Surgeon: Malissa Hippo, MD;  [facility];  Service: Endoscopy;  Laterality: N/A;  1030   HERNIA REPAIR     umblical times 2; inguinal (left)    IR REMOVAL TUN ACCESS W/ PORT W/O FL MOD SED  07/08/2016   POLYPECTOMY  08/12/2018   Procedure: POLYPECTOMY;  Surgeon: Malissa Hippo, MD;  [facility];  Service: Endoscopy;;  colon   port a cath placement     RHINOPLASTY           Allergies  Allergen Reactions   Flomax [Tamsulosin Hcl] Swelling    congestion     Current Outpatient Medications  Medication Instructions   acetaminophen (TYLENOL) 1,000 mg, Every 6 hours PRN   Ca Carbonate-Mag Hydroxide (ROLAIDS PO) 2 tablets, Oral, Daily PRN    Carboxymethylcellul-Glycerin (LUBRICATING EYE DROPS OP) 1 drop, Both Eyes, Daily PRN   clonazePAM (KLONOPIN) 0.5 MG tablet 1 bid  1  prn   diphenhydrAMINE (BENADRYL) 25 mg, Oral, At bedtime PRN   hydrochlorothiazide (HYDRODIURIL) 25 mg, Oral, Daily   hydrocortisone cream 1 % 1 application , Topical, Daily PRN   ibuprofen (ADVIL) 600-800 mg, Oral, Every 6 hours PRN   metoprolol succinate (TOPROL XL) 25 mg, Oral, Daily, Take with or immediately following a meal.   oxymetazoline (AFRIN) 0.05 % nasal spray 1-2 sprays, Each Nare, 2 times daily PRN   traMADol (ULTRAM) 50 MG tablet No dose, route, or frequency recorded.   valACYclovir (VALTREX) 500 mg, Daily PRN   zolpidem (AMBIEN) 10 mg, Oral, Daily at bedtime    Social History   Occupational History   Not on file  Tobacco Use   Smoking status: Never   Smokeless tobacco: Never  Vaping Use   Vaping status: Never Used  Substance and Sexual Activity   Alcohol use: Yes    Alcohol/week: 6.0 standard drinks of alcohol    Types: 6 Cans of beer per week    Comment: beer occas   Drug use: No    Frequency: 2.0  times per week    Types: Marijuana    Comment: smokes daily for thirty years. currently takes one puff   Sexual activity: Yes    Partners: Female    Birth control/protection: Condom     Objective:    BP (!) 150/80 (BP Location: Left Arm, Patient Position: Sitting)   Pulse (!) 57   Resp 18   Ht 6\' 1"  (1.854 m)   Wt 188 lb (85.3 kg)   SpO2 94% Comment: RA  BMI 24.80 kg/m  BP Readings from Last 3 Encounters:  09/15/22 (!) 150/80  05/27/22 125/78  11/20/21 (!) 153/79   Wt Readings from Last 3 Encounters:  09/15/22 188 lb (85.3 kg)  05/27/22 186 lb 9.6 oz (84.6 kg)  11/20/21 194 lb 4.8 oz (88.1 kg)      Physical Exam Constitutional:      General: He is not in acute distress.    Appearance: Normal appearance.  Cardiovascular:     Rate and Rhythm: Normal rate and regular rhythm.     Pulses: Normal pulses.     Heart  sounds: No murmur heard. Pulmonary:     Effort: Pulmonary effort is normal.     Breath sounds: Normal breath sounds.  Abdominal:     Palpations: Abdomen is soft.  Musculoskeletal:        General: No swelling.     Right lower leg: No edema.     Left lower leg: No edema.  Skin:    General: Skin is warm and dry.     Coloration: Skin is not jaundiced.  Neurological:     Mental Status: He is alert.     Motor: Weakness present.     Gait: Gait abnormal.  Psychiatric:        Thought Content: Thought content normal.     No results found for any visits on 09/15/22.      Assessment & Plan:   Ongoing surveillance of ascending thoracic aortic aneurysm.  Tolerating and measures approximately 4.5 cm but the final result is pending from the radiology report.  I will follow-up on this.  We discussed the importance of good ongoing management of his chronic medical conditions.  Did recommend he discuss both potential sleep apnea as well as only fair control of his blood pressure with primary care and he agrees with this.  May require increase in dosing of his antihypertensives or potentially additional medications.  See the patient in 1 year for repeat CTA at that time.    Problem List Items Addressed This Visit     Ascending aortic aneurysm (HCC) - Primary    No orders of the defined types were placed in this encounter.   No follow-ups on file.  Rowe Clack, PA-C

## 2022-09-15 NOTE — Patient Instructions (Signed)
Discussed lifestyle and general considerations in terms of management of aortic aneurysm disease.

## 2022-09-24 ENCOUNTER — Ambulatory Visit (HOSPITAL_COMMUNITY): Payer: Medicare (Managed Care) | Admitting: Psychiatry

## 2022-09-27 DIAGNOSIS — I10 Essential (primary) hypertension: Secondary | ICD-10-CM | POA: Diagnosis not present

## 2022-09-30 DIAGNOSIS — M5451 Vertebrogenic low back pain: Secondary | ICD-10-CM | POA: Diagnosis not present

## 2022-10-08 ENCOUNTER — Other Ambulatory Visit: Payer: Self-pay

## 2022-10-08 ENCOUNTER — Ambulatory Visit (HOSPITAL_BASED_OUTPATIENT_CLINIC_OR_DEPARTMENT_OTHER): Payer: Medicare (Managed Care) | Admitting: Psychiatry

## 2022-10-08 ENCOUNTER — Encounter (HOSPITAL_COMMUNITY): Payer: Self-pay | Admitting: Psychiatry

## 2022-10-08 VITALS — BP 144/89 | HR 87 | Ht 72.0 in | Wt 185.0 lb

## 2022-10-08 DIAGNOSIS — F4323 Adjustment disorder with mixed anxiety and depressed mood: Secondary | ICD-10-CM | POA: Diagnosis not present

## 2022-10-08 MED ORDER — ZOLPIDEM TARTRATE 10 MG PO TABS: 10.0000 mg | ORAL_TABLET | Freq: Every day | ORAL | 5 refills | Status: DC

## 2022-10-08 MED ORDER — CLONAZEPAM 0.5 MG PO TABS: ORAL_TABLET | ORAL | 4 refills | Status: DC

## 2022-10-08 NOTE — Progress Notes (Signed)
[name], male   DOB: 19-Dec-1950, 72 y.o.   MRN: [id number] [facility] MD Progress Note  10/08/2022 3:33 PM Douglas Bryant  MRN:  [id number] Subjective: Lethargic Principal Problem: Dysthymic disorder   Today the patient is doing well.  He is at his baseline.  It should be noted this year his sister died of leukemia.  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  The patient himself is not all that physically well.  He has spinal stenosis.  He recently had an MRI and a decision was made not to do surgery for now.  Instead of physical therapy.  He comes in with a cane.  The patient's anxiety and mood is fairly stable.  He does not drink alcohol or use any drugs.  He likes playing guitar.  He is really at his baseline.  He is off BuSpar.  The patient is stable.  He is having trouble sleeping at night mainly because he has polyuria.  He is going to be seeing a neurologist soon. Past Surgical History:  Procedure Laterality Date   BONE MARROW BIOPSY     COLONOSCOPY N/A 08/12/2018   Procedure: COLONOSCOPY;  Surgeon: Malissa Hippo, MD;  [facility];  Service: Endoscopy;  Laterality: N/A;  1030   HERNIA REPAIR     umblical times 2; inguinal (left)    IR REMOVAL TUN ACCESS W/ PORT W/O FL MOD SED  07/08/2016   POLYPECTOMY  08/12/2018   Procedure: POLYPECTOMY;  Surgeon: Malissa Hippo, MD;  [facility];  Service: Endoscopy;;  colon   port a cath placement     RHINOPLASTY     Family History:  Family History  Problem Relation Age of Onset   Alcohol abuse Father    Depression Sister    Anxiety disorder Sister    Depression Sister    Anxiety disorder Sister    Family Psychiatric  History:  Social History:  Social History   Substance and Sexual Activity  Alcohol Use Yes   Alcohol/week: 6.0 standard drinks of alcohol   Types: 6 Cans of beer per week   Comment: beer occas     Social History   Substance and Sexual Activity   Drug Use No   Frequency: 2.0 times per week   Types: Marijuana   Comment: smokes daily for thirty years. currently takes one puff    Social History   Socioeconomic History   Marital status: Divorced    Spouse name: Not on file   Number of children: Not on file   Years of education: Not on file   Highest education level: Not on file  Occupational History   Not on file  Tobacco Use   Smoking status: Never   Smokeless tobacco: Never  Vaping Use   Vaping status: Never Used  Substance and Sexual Activity   Alcohol use: Yes    Alcohol/week: 6.0 standard drinks of alcohol    Types: 6 Cans of beer per week    Comment: beer occas   Drug use: No    Frequency: 2.0 times per week    Types: Marijuana    Comment: smokes daily for thirty years. currently takes one puff   Sexual activity: Yes    Partners: Female    Birth control/protection: Condom  Other Topics Concern   Not on file  Social History Narrative   Not on file   Social Determinants of  Health   Financial Resource Strain: Not on file  Food Insecurity: Not on file  Transportation Needs: Not on file  Physical Activity: Not on file  Stress: Not on file  Social Connections: Not on file   Additional Social History:                         Sleep: Fair  Appetite:  Good  Current Medications: Current Outpatient Medications  Medication Sig Dispense Refill   acetaminophen (TYLENOL) 500 MG tablet Take 1,000 mg by mouth every 6 (six) hours as needed for mild pain.     Ca Carbonate-Mag Hydroxide (ROLAIDS PO) Take 2 tablets by mouth daily as needed (heartburn).     Carboxymethylcellul-Glycerin (LUBRICATING EYE DROPS OP) Place 1 drop into both eyes daily as needed (dry eyes).     diphenhydrAMINE (BENADRYL) 25 mg capsule Take 25 mg by mouth at bedtime as needed for allergies or sleep.      hydrochlorothiazide (HYDRODIURIL) 25 MG tablet Take 1 tablet (25 mg total) by mouth daily. 30 tablet 3   hydrocortisone cream 1 %  Apply 1 application topically daily as needed for itching.     ibuprofen (ADVIL) 200 MG tablet Take 600-800 mg by mouth every 6 (six) hours as needed for headache or moderate pain.     oxymetazoline (AFRIN) 0.05 % nasal spray Place 1-2 sprays into both nostrils 2 (two) times daily as needed for congestion.     traMADol (ULTRAM) 50 MG tablet      valACYclovir (VALTREX) 500 MG tablet Take 500 mg by mouth daily as needed (Herpes Virus).     clonazePAM (KLONOPIN) 0.5 MG tablet 1 bid  1  prn 70 tablet 4   metoprolol succinate (TOPROL XL) 25 MG 24 hr tablet Take 1 tablet (25 mg total) by mouth daily. Take with or immediately following a meal. 30 tablet 11   zolpidem (AMBIEN) 10 MG tablet Take 1 tablet (10 mg total) by mouth at bedtime. 30 tablet 5   No current facility-administered medications for this visit.    Lab Results: No results found for this or any previous visit (from the past 48 hour(s)).  Blood Alcohol level:  No results found for: "ETH"  Physical Findings: AIMS:  , ,  ,  ,    CIWA:    COWS:     Musculoskeletal: Strength & Muscle Tone: within normal limits Gait & Station: normal Patient leans: N/A  Psychiatric Specialty Exam: ROS  Blood pressure (!) 144/89, pulse 87, height 6' (1.829 m), weight 185 lb (83.9 kg).Body mass index is 25.09 kg/m.  General Appearance: Casual  Eye Contact::  Good  Speech:  Clear and Coherent  Volume:  Normal  Mood:mild depression  Affect:  Congruent  Thought Process:  Coherent  Orientation:  Full (Time, Place, and Person)  Thought Content:  WDL  Suicidal Thoughts:  No  Homicidal Thoughts:  No  Memory:  NA  Judgement:  Good  Insight:  Fair  Psychomotor Activity:  Normal  Concentration:  Good  Recall:  Good  Fund of Knowledge:Good  Language: Fair  Akathisia:  No  Handed:  Right  AIMS (if indicated):     Assets:  Desire for Improvement  ADL's:  Intact  Cognition: WNL  Sleep:      Treatment Plan Summary: 10/08/2022,    This  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  At this time the patient continued taking Klonopin  0.5 mg twice daily with 1 as needed.  The second problem is insomnia.  The patient takes Ambien on a regular basis.  With these 2 medicines the patient is actually doing fairly well.  His major issue is around his back and entered back into physical therapy.  He will return to see me in 4 or 5 months.

## 2022-10-13 DIAGNOSIS — Z299 Encounter for prophylactic measures, unspecified: Secondary | ICD-10-CM | POA: Diagnosis not present

## 2022-10-13 DIAGNOSIS — C8590 Non-Hodgkin lymphoma, unspecified, unspecified site: Secondary | ICD-10-CM | POA: Diagnosis not present

## 2022-10-13 DIAGNOSIS — R52 Pain, unspecified: Secondary | ICD-10-CM | POA: Diagnosis not present

## 2022-10-13 DIAGNOSIS — Z23 Encounter for immunization: Secondary | ICD-10-CM | POA: Diagnosis not present

## 2022-10-13 DIAGNOSIS — I10 Essential (primary) hypertension: Secondary | ICD-10-CM | POA: Diagnosis not present

## 2022-10-13 DIAGNOSIS — R351 Nocturia: Secondary | ICD-10-CM | POA: Diagnosis not present

## 2022-10-13 DIAGNOSIS — Z0000 Encounter for general adult medical examination without abnormal findings: Secondary | ICD-10-CM | POA: Diagnosis not present

## 2022-10-14 DIAGNOSIS — E038 Other specified hypothyroidism: Secondary | ICD-10-CM | POA: Diagnosis not present

## 2022-10-14 DIAGNOSIS — R7989 Other specified abnormal findings of blood chemistry: Secondary | ICD-10-CM | POA: Diagnosis not present

## 2022-10-15 DIAGNOSIS — C44219 Basal cell carcinoma of skin of left ear and external auricular canal: Secondary | ICD-10-CM | POA: Diagnosis not present

## 2022-10-30 DIAGNOSIS — M5416 Radiculopathy, lumbar region: Secondary | ICD-10-CM | POA: Diagnosis not present

## 2022-11-12 ENCOUNTER — Inpatient Hospital Stay: Payer: Medicare (Managed Care) | Attending: Hematology

## 2022-11-12 DIAGNOSIS — R110 Nausea: Secondary | ICD-10-CM | POA: Insufficient documentation

## 2022-11-12 DIAGNOSIS — R634 Abnormal weight loss: Secondary | ICD-10-CM | POA: Diagnosis not present

## 2022-11-12 DIAGNOSIS — Z23 Encounter for immunization: Secondary | ICD-10-CM | POA: Diagnosis not present

## 2022-11-12 DIAGNOSIS — C83398 Diffuse large b-cell lymphoma of other extranodal and solid organ sites: Secondary | ICD-10-CM | POA: Insufficient documentation

## 2022-11-12 DIAGNOSIS — Z818 Family history of other mental and behavioral disorders: Secondary | ICD-10-CM | POA: Insufficient documentation

## 2022-11-12 DIAGNOSIS — R61 Generalized hyperhidrosis: Secondary | ICD-10-CM | POA: Insufficient documentation

## 2022-11-12 DIAGNOSIS — Z9221 Personal history of antineoplastic chemotherapy: Secondary | ICD-10-CM | POA: Insufficient documentation

## 2022-11-12 DIAGNOSIS — Z811 Family history of alcohol abuse and dependence: Secondary | ICD-10-CM | POA: Diagnosis not present

## 2022-11-12 DIAGNOSIS — Z79899 Other long term (current) drug therapy: Secondary | ICD-10-CM | POA: Insufficient documentation

## 2022-11-12 DIAGNOSIS — Z7289 Other problems related to lifestyle: Secondary | ICD-10-CM | POA: Insufficient documentation

## 2022-11-12 DIAGNOSIS — C8330 Diffuse large B-cell lymphoma, unspecified site: Secondary | ICD-10-CM

## 2022-11-12 LAB — COMPREHENSIVE METABOLIC PANEL WITH GFR
ALT: 19 U/L (ref 0–44)
AST: 21 U/L (ref 15–41)
Albumin: 4 g/dL (ref 3.5–5.0)
Alkaline Phosphatase: 39 U/L (ref 38–126)
Anion gap: 7 (ref 5–15)
BUN: 12 mg/dL (ref 8–23)
CO2: 26 mmol/L (ref 22–32)
Calcium: 8.7 mg/dL — ABNORMAL LOW (ref 8.9–10.3)
Chloride: 100 mmol/L (ref 98–111)
Creatinine, Ser: 0.81 mg/dL (ref 0.61–1.24)
GFR, Estimated: >60 mL/min
Glucose, Bld: 97 mg/dL (ref 70–99)
Potassium: 3.9 mmol/L (ref 3.5–5.1)
Sodium: 133 mmol/L — ABNORMAL LOW (ref 135–145)
Total Bilirubin: 1.1 mg/dL (ref 0.3–1.2)
Total Protein: 7 g/dL (ref 6.5–8.1)

## 2022-11-12 LAB — CBC WITH DIFFERENTIAL/PLATELET
Abs Immature Granulocytes: 0.08 10*3/uL — ABNORMAL HIGH (ref 0.00–0.07)
Basophils Absolute: 0.2 10*3/uL — ABNORMAL HIGH (ref 0.0–0.1)
Basophils Relative: 2 %
Eosinophils Absolute: 0.4 10*3/uL (ref 0.0–0.5)
Eosinophils Relative: 5 %
HCT: 40.9 % (ref 39.0–52.0)
Hemoglobin: 14.7 g/dL (ref 13.0–17.0)
Immature Granulocytes: 1 %
Lymphocytes Relative: 29 %
Lymphs Abs: 2.4 10*3/uL (ref 0.7–4.0)
MCH: 33.9 pg (ref 26.0–34.0)
MCHC: 35.9 g/dL (ref 30.0–36.0)
MCV: 94.5 fL (ref 80.0–100.0)
Monocytes Absolute: 0.6 10*3/uL (ref 0.1–1.0)
Monocytes Relative: 7 %
Neutro Abs: 4.7 10*3/uL (ref 1.7–7.7)
Neutrophils Relative %: 56 %
Platelets: 229 10*3/uL (ref 150–400)
RBC: 4.33 MIL/uL (ref 4.22–5.81)
RDW: 13.1 % (ref 11.5–15.5)
WBC: 8.3 10*3/uL (ref 4.0–10.5)
nRBC: 0 % (ref 0.0–0.2)

## 2022-11-12 LAB — LACTATE DEHYDROGENASE: LDH: 176 U/L (ref 98–192)

## 2022-11-19 ENCOUNTER — Inpatient Hospital Stay: Payer: Medicare (Managed Care)

## 2022-11-19 ENCOUNTER — Other Ambulatory Visit: Payer: Self-pay

## 2022-11-19 ENCOUNTER — Encounter: Payer: Self-pay | Admitting: Hematology

## 2022-11-19 ENCOUNTER — Inpatient Hospital Stay (HOSPITAL_BASED_OUTPATIENT_CLINIC_OR_DEPARTMENT_OTHER): Payer: Medicare (Managed Care) | Admitting: Hematology

## 2022-11-19 VITALS — BP 140/90 | HR 68 | Temp 97.8°F | Resp 16 | Wt 188.8 lb

## 2022-11-19 DIAGNOSIS — C8330 Diffuse large B-cell lymphoma, unspecified site: Secondary | ICD-10-CM | POA: Diagnosis not present

## 2022-11-19 DIAGNOSIS — C83398 Diffuse large b-cell lymphoma of other extranodal and solid organ sites: Secondary | ICD-10-CM | POA: Diagnosis not present

## 2022-11-19 MED ORDER — INFLUENZA VAC A&B SURF ANT ADJ 0.5 ML IM SUSY
0.5000 mL | PREFILLED_SYRINGE | Freq: Once | INTRAMUSCULAR | Status: AC
  Administered 2022-11-19: 0.5 mL via INTRAMUSCULAR
  Filled 2022-11-19: qty 0.5

## 2022-11-19 NOTE — Progress Notes (Signed)
[facility] [address] [place], [place] [address]   CLINIC:  Medical Oncology/Hematology  PCP:  Kirstie Peri, MD [address] [place] [address] [phone]   REASON FOR VISIT:  Follow-up for  DLBCL  PRIOR THERAPY: Chemotherapy with 6 cycles at [facility] completed on 10/27/2013   NGS Results: not done  CURRENT THERAPY: surveillance  BRIEF ONCOLOGIC HISTORY:  Oncology History  DLBCL (diffuse large B cell lymphoma) (HCC)  07/07/2013 Initial Biopsy   CT guided right iliac bone marrow aspiration and core biopsy.   07/07/2013 Pathology Results   Bone Marrow Flow Cytometry - PREDOMINANCE OF T-LYMPHOCYTES WITH NONSPECIFIC REVERSAL OF THE CD4:CD8 RATIO. - NO MONOCLONAL B-CELL POPULATION IDENTIFIED. Bone Marrow, Aspirate,Biopsy, and Clot, right iliac - SLIGHTLY HYPERCELLULAR BONE MARROW FOR AGE WITH TRILINEAGE HEMATOPOIESIS. - NEGATIVE FOR LYMPHOMA - SEE COMMENT. PERIPHERAL BLOOD: - NORMOCYTIC-NORMOCHROMIC ANEMIA. - NEUTROPHILIC LEFT SHIFT.   07/13/2013 - 10/27/2013 Chemotherapy   R-CHOP x 6 cycles by Dr. Ubaldo Glassing at [facility]    09/01/2013 PET scan   Minimal metabolic activity associated with the splenic lesion. Otherwise complete response to chemotherapy. No residual hypermetabolic lymph nodes in the skullbase to thigh PET scan. Marked reduction in  lingular consolidation   09/01/2013 PET scan   1. Minimal metabolic activity associated with the splenic lesion. Otherwise complete response to chemotherapy. 1. No residual hypermetabolic lymph nodes in the skullbase to thigh PET scan.   2. Marked reduction in  lingular consolidation   11/28/2013 PET scan   1. Today's study is very similar to prior study from 09/01/2013. Specifically, the ill-defined low-attenuation lesion in the anterior aspect of the spleen is slightly smaller, currently measuring 2.1 x 2.3 cm, and continues to demonstrate some very low-level metabolic activity (SUVmax = 2.9). 2. There is  a persistent mass-like opacity in the posterior aspect of the left upper lobe, which also appears slightly smaller than the prior examination. This too demonstrates some low-level metabolic activity (SUVmax = 2.7). While this may simply represent a resolving post infectious or inflammatory scar, if there is any clinical concern that the opacity on the original study from 06/09/2013 that this was in fact a lymphomatous infiltrate, this could represent a residual focus of disease. 3. No new foci of disease noted in the neck, chest, abdomen or pelvis. 4. Atherosclerosis, including left main and 3 vessel coronary artery disease. Please note that although the presence of coronary artery calcium documents the presence of coronary artery disease, the severity of this disease and any potential stenosis cannot be assessed on this non-gated CT examination. Assessment for potential risk factor modification, dietary therapy or pharmacologic therapy may be warranted, if clinically indicated. 5. Mild cardiomegaly.   07/17/2015 Initial Diagnosis   DLBCL (diffuse large B cell lymphoma) (HCC)   10/17/2015 Imaging   CT CAP- Stable exam. No evidence for lymphadenopathy in the chest, abdomen, or pelvis. No new or progressive interval findings. 2. 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     CANCER STAGING:  Cancer Staging  No matching staging information was found for the patient.   INTERVAL HISTORY:  [name], a 72 y.o. male, seen for follow-up of large B-cell lymphoma.  Denies fevers, night sweats or weight loss in the last 6 months.  Appetite is 100%.  Energy levels are 25%.  REVIEW OF SYSTEMS:  Review of Systems  Constitutional:  Negative for appetite change.  All other systems reviewed and are negative.  PAST MEDICAL/SURGICAL HISTORY:  Past Medical History:  Diagnosis Date   Anemia    Aneurysm, aorta, thoracic (HCC)    Anxiety    Back pain     Depression    DLBCL (diffuse large B cell lymphoma) (HCC) 07/17/2015   Environmental allergies    History of blood transfusion    History of bronchitis    History of chemotherapy    Hypertension    Hypothyroidism    Nocturia    Peripheral vascular disease (HCC)    Urinary frequency    Past Surgical History:  Procedure Laterality Date   BONE MARROW BIOPSY     COLONOSCOPY N/A 08/12/2018   Procedure: COLONOSCOPY;  Surgeon: Malissa Hippo, MD;  [facility];  Service: Endoscopy;  Laterality: N/A;  1030   HERNIA REPAIR     umblical times 2; inguinal (left)    IR REMOVAL TUN ACCESS W/ PORT W/O FL MOD SED  07/08/2016   POLYPECTOMY  08/12/2018   Procedure: POLYPECTOMY;  Surgeon: Malissa Hippo, MD;  [facility];  Service: Endoscopy;;  colon   port a cath placement     RHINOPLASTY      SOCIAL HISTORY:  Social History   Socioeconomic History   Marital status: Divorced    Spouse name: Not on file   Number of children: Not on file   Years of education: Not on file   Highest education level: Not on file  Occupational History   Not on file  Tobacco Use   Smoking status: Never   Smokeless tobacco: Never  Vaping Use   Vaping status: Never Used  Substance and Sexual Activity   Alcohol use: Yes    Alcohol/week: 6.0 standard drinks of alcohol    Types: 6 Cans of beer per week    Comment: beer occas   Drug use: No    Frequency: 2.0 times per week    Types: Marijuana    Comment: smokes daily for thirty years. currently takes one puff   Sexual activity: Yes    Partners: Female    Birth control/protection: Condom  Other Topics Concern   Not on file  Social History Narrative   Not on file   Social Determinants of Health   Financial Resource Strain: Not on file  Food Insecurity: Not on file  Transportation Needs: Not on file  Physical Activity: Not on file  Stress: Not on file  Social Connections: Not on file  Intimate Partner Violence: Not on file     FAMILY HISTORY:  Family History  Problem Relation Age of Onset   Alcohol abuse Father    Depression Sister    Anxiety disorder Sister    Depression Sister    Anxiety disorder Sister     CURRENT MEDICATIONS:  Current Outpatient Medications  Medication Sig Dispense Refill   acetaminophen (TYLENOL) 500 MG tablet Take 1,000 mg by mouth every 6 (six) hours as needed for mild pain.     Ca Carbonate-Mag Hydroxide (ROLAIDS PO) Take 2 tablets by mouth daily as needed (heartburn).     Carboxymethylcellul-Glycerin (LUBRICATING EYE DROPS OP) Place 1 drop into both eyes daily as needed (dry eyes).     clonazePAM (KLONOPIN) 0.5 MG tablet 1 bid  1  prn 70 tablet 4   diphenhydrAMINE (BENADRYL) 25 mg capsule Take 25 mg by mouth at bedtime as needed for allergies or sleep.      hydrochlorothiazide (HYDRODIURIL) 25 MG tablet Take 1 tablet (25  mg total) by mouth daily. 30 tablet 3   hydrocortisone cream 1 % Apply 1 application topically daily as needed for itching.     ibuprofen (ADVIL) 200 MG tablet Take 600-800 mg by mouth every 6 (six) hours as needed for headache or moderate pain.     oxymetazoline (AFRIN) 0.05 % nasal spray Place 1-2 sprays into both nostrils 2 (two) times daily as needed for congestion.     traMADol (ULTRAM) 50 MG tablet      valACYclovir (VALTREX) 500 MG tablet Take 500 mg by mouth daily as needed (Herpes Virus).     zolpidem (AMBIEN) 10 MG tablet Take 1 tablet (10 mg total) by mouth at bedtime. 30 tablet 5   metoprolol succinate (TOPROL XL) 25 MG 24 hr tablet Take 1 tablet (25 mg total) by mouth daily. Take with or immediately following a meal. 30 tablet 11   No current facility-administered medications for this visit.   Facility-Administered Medications Ordered in Other Visits  Medication Dose Route Frequency Provider Last Rate Last Admin   influenza vaccine adjuvanted (FLUAD) injection 0.5 mL  0.5 mL Intramuscular Once Doreatha Massed, MD        ALLERGIES:   Allergies  Allergen Reactions   Flomax [Tamsulosin Hcl] Swelling    congestion     PHYSICAL EXAM:  Performance status (ECOG): 1 - Symptomatic but completely ambulatory  Vitals:   11/19/22 1447  BP: (!) 140/90  Pulse: 68  Resp: 16  Temp: 97.8 F (36.6 C)  SpO2: 98%   Wt Readings from Last 3 Encounters:  11/19/22 188 lb 12.8 oz (85.6 kg)  09/15/22 188 lb (85.3 kg)  05/27/22 186 lb 9.6 oz (84.6 kg)   Physical Exam Vitals reviewed.  Constitutional:      Appearance: Normal appearance.  Cardiovascular:     Rate and Rhythm: Normal rate and regular rhythm.     Pulses: Normal pulses.     Heart sounds: Normal heart sounds.  Pulmonary:     Effort: Pulmonary effort is normal.     Breath sounds: Normal breath sounds.  Abdominal:     Palpations: Abdomen is soft. There is no hepatomegaly, splenomegaly or mass.     Tenderness: There is no abdominal tenderness.  Musculoskeletal:     Right lower leg: No edema.     Left lower leg: No edema.  Lymphadenopathy:     Cervical: No cervical adenopathy.     Right cervical: No superficial cervical adenopathy.    Left cervical: No superficial cervical adenopathy.     Upper Body:     Right upper body: No supraclavicular, axillary or pectoral adenopathy.     Left upper body: No supraclavicular, axillary or pectoral adenopathy.  Neurological:     General: No focal deficit present.     Mental Status: He is alert and oriented to person, place, and time.  Psychiatric:        Mood and Affect: Mood normal.        Behavior: Behavior normal.      LABORATORY DATA:  I have reviewed the labs as listed.     Latest Ref Rng & Units 11/12/2022    2:10 PM 11/13/2021    2:45 PM 11/06/2020    1:57 PM  CBC  WBC 4.0 - 10.5 K/uL 8.3  9.0  6.6   Hemoglobin 13.0 - 17.0 g/dL 16.1  09.6  04.5   Hematocrit 39.0 - 52.0 % 40.9  42.0  42.6   Platelets 150 -  400 K/uL 229  255  228       Latest Ref Rng & Units 11/12/2022    2:10 PM 11/13/2021    2:45 PM  11/06/2020    1:57 PM  CMP  Glucose 70 - 99 mg/dL 97  161  096   BUN 8 - 23 mg/dL 12  22  16    Creatinine 0.61 - 1.24 mg/dL 0.45  4.09  8.11   Sodium 135 - 145 mmol/L 133  134  134   Potassium 3.5 - 5.1 mmol/L 3.9  3.7  4.0   Chloride 98 - 111 mmol/L 100  103  104   CO2 22 - 32 mmol/L 26  22  24    Calcium 8.9 - 10.3 mg/dL 8.7  9.0  8.8   Total Protein 6.5 - 8.1 g/dL 7.0  6.7  7.4   Total Bilirubin 0.3 - 1.2 mg/dL 1.1  1.1  1.2   Alkaline Phos 38 - 126 U/L 39  38  46   AST 15 - 41 U/L 21  24  29    ALT 0 - 44 U/L 19  32  25     DIAGNOSTIC IMAGING:  I have independently reviewed the scans and discussed with the patient. No results found.   ASSESSMENT:  1.  Stage IVB DLBCL: - Presentation with night sweats, 40 pound weight loss, nausea. -Treated with 6 cycles of chemotherapy at [facility], completed on 10/27/2013.   PLAN:  1.  Stage IVB DLBCL: - He does not report any night sweats, fevers or weight loss. - Physical exam: No palpable adenopathy or splenomegaly. - Labs on 11/12/2022: Normal LFTs.  CBC grossly normal.  LDH is normal. - CT angiogram from August 2024 did not show any evidence of adenopathy. - Recommend follow-up in 1 year with repeat labs and exam.  If he remains stable, we will discharge him from the clinic at next visit.   Orders placed this encounter:  Orders Placed This Encounter  Procedures   CBC with Differential   Comprehensive metabolic panel   Lactate dehydrogenase      Doreatha Massed, MD [facility] [phone]

## 2022-11-19 NOTE — Patient Instructions (Signed)
[facility] at [facility] Discharge Instructions   You were seen and examined today by Dr. Ellin Saba.  He reviewed the results of your lab work which are normal/stable.   We will see you back in 1 year. We will repeat lab work at that time.  Return as scheduled.    Thank you for choosing [facility] at [facility] to provide your oncology and hematology care.  To afford each patient quality time with our provider, please arrive at least 15 minutes before your scheduled appointment time.   If you have a lab appointment with the Cancer Center please come in thru the Main Entrance and check in at the main information desk.  You need to re-schedule your appointment should you arrive 10 or more minutes late.  We strive to give you quality time with our providers, and arriving late affects you and other patients whose appointments are after yours.  Also, if you no show three or more times for appointments you may be dismissed from the clinic at the providers discretion.     Again, thank you for choosing [facility].  Our hope is that these requests will decrease the amount of time that you wait before being seen by our physicians.       _____________________________________________________________  Should you have questions after your visit to [facility], please contact our office at [phone] and follow the prompts.  Our office hours are 8:00 a.m. and 4:30 p.m. Monday - Friday.  Please note that voicemails left after 4:00 p.m. may not be returned until the following business day.  We are closed weekends and major holidays.  You do have access to a nurse 24-7, just call the main number to the clinic [phone] and do not press any options, hold on the line and a nurse will answer the phone.    For prescription refill requests, have your pharmacy contact our office and allow 72 hours.    Due to Covid, you will need to  wear a mask upon entering the hospital. If you do not have a mask, a mask will be given to you at the Main Entrance upon arrival. For doctor visits, patients may have 1 support person age 66 or older with them. For treatment visits, patients can not have anyone with them due to social distancing guidelines and our immunocompromised population.

## 2022-11-24 NOTE — Progress Notes (Signed)
Douglas Bryant presents today for injection per the provider's orders. Flu shot administration without incident; injection site WNL; see MAR for injection details. Patient tolerated procedure well and without incident.  No questions or complaints noted at this time.

## 2022-12-24 DIAGNOSIS — Z299 Encounter for prophylactic measures, unspecified: Secondary | ICD-10-CM | POA: Diagnosis not present

## 2022-12-24 DIAGNOSIS — R7989 Other specified abnormal findings of blood chemistry: Secondary | ICD-10-CM | POA: Diagnosis not present

## 2022-12-24 DIAGNOSIS — I10 Essential (primary) hypertension: Secondary | ICD-10-CM | POA: Diagnosis not present

## 2022-12-24 DIAGNOSIS — H6692 Otitis media, unspecified, left ear: Secondary | ICD-10-CM | POA: Diagnosis not present

## 2022-12-26 DIAGNOSIS — I10 Essential (primary) hypertension: Secondary | ICD-10-CM | POA: Diagnosis not present

## 2023-01-23 DIAGNOSIS — M5451 Vertebrogenic low back pain: Secondary | ICD-10-CM | POA: Diagnosis not present

## 2023-01-26 DIAGNOSIS — I10 Essential (primary) hypertension: Secondary | ICD-10-CM | POA: Diagnosis not present

## 2023-02-04 DIAGNOSIS — R41840 Attention and concentration deficit: Secondary | ICD-10-CM | POA: Diagnosis not present

## 2023-02-04 DIAGNOSIS — E038 Other specified hypothyroidism: Secondary | ICD-10-CM | POA: Diagnosis not present

## 2023-02-04 DIAGNOSIS — I10 Essential (primary) hypertension: Secondary | ICD-10-CM | POA: Diagnosis not present

## 2023-02-04 DIAGNOSIS — Z299 Encounter for prophylactic measures, unspecified: Secondary | ICD-10-CM | POA: Diagnosis not present

## 2023-02-04 DIAGNOSIS — F429 Obsessive-compulsive disorder, unspecified: Secondary | ICD-10-CM | POA: Diagnosis not present

## 2023-02-04 DIAGNOSIS — R7989 Other specified abnormal findings of blood chemistry: Secondary | ICD-10-CM | POA: Diagnosis not present

## 2023-02-04 DIAGNOSIS — E349 Endocrine disorder, unspecified: Secondary | ICD-10-CM | POA: Diagnosis not present

## 2023-02-11 DIAGNOSIS — M5416 Radiculopathy, lumbar region: Secondary | ICD-10-CM | POA: Diagnosis not present

## 2023-02-18 DIAGNOSIS — E349 Endocrine disorder, unspecified: Secondary | ICD-10-CM | POA: Diagnosis not present

## 2023-02-18 DIAGNOSIS — E038 Other specified hypothyroidism: Secondary | ICD-10-CM | POA: Diagnosis not present

## 2023-02-18 DIAGNOSIS — R7989 Other specified abnormal findings of blood chemistry: Secondary | ICD-10-CM | POA: Diagnosis not present

## 2023-02-18 DIAGNOSIS — I10 Essential (primary) hypertension: Secondary | ICD-10-CM | POA: Diagnosis not present

## 2023-02-25 DIAGNOSIS — I10 Essential (primary) hypertension: Secondary | ICD-10-CM | POA: Diagnosis not present

## 2023-03-04 ENCOUNTER — Other Ambulatory Visit: Payer: Self-pay

## 2023-03-04 ENCOUNTER — Encounter (HOSPITAL_COMMUNITY): Payer: Self-pay | Admitting: Psychiatry

## 2023-03-04 ENCOUNTER — Ambulatory Visit (HOSPITAL_BASED_OUTPATIENT_CLINIC_OR_DEPARTMENT_OTHER): Payer: Medicare (Managed Care) | Admitting: Psychiatry

## 2023-03-04 VITALS — BP 173/90 | HR 57 | Ht 73.0 in | Wt 189.0 lb

## 2023-03-04 DIAGNOSIS — F4323 Adjustment disorder with mixed anxiety and depressed mood: Secondary | ICD-10-CM

## 2023-03-04 MED ORDER — BUSPIRONE HCL 10 MG PO TABS: 10.0000 mg | ORAL_TABLET | Freq: Two times a day (BID) | ORAL | 4 refills | Status: DC

## 2023-03-04 MED ORDER — CLONAZEPAM 0.5 MG PO TABS: ORAL_TABLET | ORAL | 4 refills | Status: DC

## 2023-03-04 MED ORDER — SERTRALINE HCL 50 MG PO TABS: 50.0000 mg | ORAL_TABLET | Freq: Every day | ORAL | 5 refills | Status: DC

## 2023-03-04 MED ORDER — ZOLPIDEM TARTRATE 10 MG PO TABS: 10.0000 mg | ORAL_TABLET | Freq: Every day | ORAL | 5 refills | Status: DC

## 2023-03-04 NOTE — Progress Notes (Signed)
 [name], male   DOB: April 22, 1950, 73 y.o.   MRN: [id number] [facility] MD Progress Note  03/04/2023 1:57 PM Douglas Bryant  MRN:  [id number] Subjective: Lethargic Principal Problem: Dysthymic disorder    Today the patient appeared more awkward than usual.  He initially started off by telling me that he is having some reexperiencing of old memories when he was a social event.  He said he would be too much for us  to get into 1 as we had a long time to discuss things.  I then asked him if he was seeing a therapist and he jumped to talking about his primary care doctor who talked with a little bit but did not want to change any medicines because he was coming to see me today.  He shared that he had been taking a little bit extra Klonopin  but not more than really he could miss.  He takes Ambien  10 mg which helps him sleep he just restarted his Zoloft  50 mg which had been wanting to do for a while.  He also went back to his BuSpar  10 mg twice daily which is what I wanted him to do.  On his next visit we will go right into the issue of getting him into therapy.  If he is having some reexperiencing from all traumas he should talk about it.  The patient has a bad back and is in the process of making a decision about having surgery.  The patient drinks no alcohol .  He will return to see me in 3 months. Past Surgical History:  Procedure Laterality Date   BONE MARROW BIOPSY     COLONOSCOPY N/A 08/12/2018   Procedure: COLONOSCOPY;  Surgeon: Golda Claudis PENNER, MD;  [facility];  Service: Endoscopy;  Laterality: N/A;  1030   HERNIA REPAIR     umblical times 2; inguinal (left)    IR REMOVAL TUN ACCESS W/ PORT W/O FL MOD SED  07/08/2016   POLYPECTOMY  08/12/2018   Procedure: POLYPECTOMY;  Surgeon: Golda Claudis PENNER, MD;  [facility];  Service: Endoscopy;;  colon   port a cath placement     RHINOPLASTY     Family History:  Family History  Problem Relation Age of Onset    Alcohol  abuse Father    Depression Sister    Anxiety disorder Sister    Depression Sister    Anxiety disorder Sister    Family Psychiatric  History:  Social History:  Social History   Substance and Sexual Activity  Alcohol  Use Yes   Alcohol /week: 6.0 standard drinks of alcohol    Types: 6 Cans of beer per week   Comment: beer occas     Social History   Substance and Sexual Activity  Drug Use No   Frequency: 2.0 times per week   Types: Marijuana   Comment: smokes daily for thirty years. currently takes one puff    Social History   Socioeconomic History   Marital status: Divorced    Spouse name: Not on file   Number of children: Not on file   Years of education: Not on file   Highest education level: Not on file  Occupational History   Not on file  Tobacco Use   Smoking status: Never   Smokeless tobacco: Never  Vaping Use   Vaping status: Never Used  Substance and Sexual Activity   Alcohol  use: Yes    Alcohol /week: 6.0 standard drinks of alcohol   Types: 6 Cans of beer per week    Comment: beer occas   Drug use: No    Frequency: 2.0 times per week    Types: Marijuana    Comment: smokes daily for thirty years. currently takes one puff   Sexual activity: Yes    Partners: Female    Birth control/protection: Condom  Other Topics Concern   Not on file  Social History Narrative   Not on file   Social Drivers of Health   Financial Resource Strain: Not on file  Food Insecurity: Not on file  Transportation Needs: Not on file  Physical Activity: Not on file  Stress: Not on file  Social Connections: Not on file   Additional Social History:                         Sleep: Fair  Appetite:  Good  Current Medications: Current Outpatient Medications  Medication Sig Dispense Refill   acetaminophen  (TYLENOL ) 500 MG tablet Take 1,000 mg by mouth every 6 (six) hours as needed for mild pain.     Ca Carbonate-Mag Hydroxide (ROLAIDS PO) Take 2 tablets by  mouth daily as needed (heartburn).     Carboxymethylcellul-Glycerin (LUBRICATING EYE DROPS OP) Place 1 drop into both eyes daily as needed (dry eyes).     diphenhydrAMINE (BENADRYL) 25 mg capsule Take 25 mg by mouth at bedtime as needed for allergies or sleep.      hydrochlorothiazide  (HYDRODIURIL ) 25 MG tablet Take 1 tablet (25 mg total) by mouth daily. 30 tablet 3   hydrocortisone cream 1 % Apply 1 application topically daily as needed for itching.     ibuprofen (ADVIL) 200 MG tablet Take 600-800 mg by mouth every 6 (six) hours as needed for headache or moderate pain.     levothyroxine  (SYNTHROID ) 50 MCG tablet Take 50 mcg by mouth daily.     modafinil (PROVIGIL) 200 MG tablet Take 200 mg by mouth daily. Takes 1/2 pill as needed     oxymetazoline  (AFRIN) 0.05 % nasal spray Place 1-2 sprays into both nostrils 2 (two) times daily as needed for congestion.     testosterone  cypionate (DEPOTESTOSTERONE CYPIONATE) 200 MG/ML injection Inject 200 mg into the muscle every 28 (twenty-eight) days.     traMADol (ULTRAM) 50 MG tablet      valACYclovir (VALTREX) 500 MG tablet Take 500 mg by mouth daily as needed (Herpes Virus).     busPIRone  (BUSPAR ) 10 MG tablet Take 1 tablet (10 mg total) by mouth 2 (two) times daily. 60 tablet 4   clonazePAM  (KLONOPIN ) 0.5 MG tablet 1 bid  1  prn 70 tablet 4   metoprolol  succinate (TOPROL  XL) 25 MG 24 hr tablet Take 1 tablet (25 mg total) by mouth daily. Take with or immediately following a meal. 30 tablet 11   sertraline  (ZOLOFT ) 50 MG tablet Take 1 tablet (50 mg total) by mouth daily. 30 tablet 5   zolpidem  (AMBIEN ) 10 MG tablet Take 1 tablet (10 mg total) by mouth at bedtime. 30 tablet 5   No current facility-administered medications for this visit.    Lab Results: No results found for this or any previous visit (from the past 48 hours).  Blood Alcohol  level:  No results found for: [facility]  Physical Findings: AIMS:  , ,  ,  ,    CIWA:    COWS:      Musculoskeletal: Strength & Muscle Tone: within normal limits Gait &  Station: normal Patient leans: N/A  Psychiatric Specialty Exam: ROS  Blood pressure (!) 173/90, pulse (!) 57, height 6' 1 (1.854 m), weight 189 lb (85.7 kg).Body mass index is 24.94 kg/m.  General Appearance: Casual  Eye Contact::  Good  Speech:  Clear and Coherent  Volume:  Normal  Mood:mild depression  Affect:  Congruent  Thought Process:  Coherent  Orientation:  Full (Time, Place, and Person)  Thought Content:  WDL  Suicidal Thoughts:  No  Homicidal Thoughts:  No  Memory:  NA  Judgement:  Good  Insight:  Fair  Psychomotor Activity:  Normal  Concentration:  Good  Recall:  Good  Fund of Knowledge:Good  Language: Fair  Akathisia:  No  Handed:  Right  AIMS (if indicated):     Assets:  Desire for Improvement  ADL's:  Intact  Cognition: WNL  Sleep:      Treatment Plan Summary: 03/04/2023,    This patient has 2 problems.  He has an adjustment disorder with an anxious mood state.  He takes Klonopin  0.5 mg twice daily and BuSpar  10 mg twice daily.  He has an atypical form of anxiety and is taking Zoloft  now 50 mg which she says is helpful.  The second problem is insomnia.  Patient takes Ambien  10 mg for this.  Return to see me in 3 months.

## 2023-03-05 DIAGNOSIS — I10 Essential (primary) hypertension: Secondary | ICD-10-CM | POA: Diagnosis not present

## 2023-03-05 DIAGNOSIS — I700 Atherosclerosis of aorta: Secondary | ICD-10-CM | POA: Diagnosis not present

## 2023-03-05 DIAGNOSIS — E349 Endocrine disorder, unspecified: Secondary | ICD-10-CM | POA: Diagnosis not present

## 2023-03-05 DIAGNOSIS — C8590 Non-Hodgkin lymphoma, unspecified, unspecified site: Secondary | ICD-10-CM | POA: Diagnosis not present

## 2023-03-05 DIAGNOSIS — Z299 Encounter for prophylactic measures, unspecified: Secondary | ICD-10-CM | POA: Diagnosis not present

## 2023-03-11 ENCOUNTER — Ambulatory Visit (HOSPITAL_COMMUNITY): Payer: Medicare (Managed Care) | Admitting: Psychiatry

## 2023-03-19 DIAGNOSIS — E291 Testicular hypofunction: Secondary | ICD-10-CM | POA: Diagnosis not present

## 2023-03-23 DIAGNOSIS — D235 Other benign neoplasm of skin of trunk: Secondary | ICD-10-CM | POA: Diagnosis not present

## 2023-03-23 DIAGNOSIS — L578 Other skin changes due to chronic exposure to nonionizing radiation: Secondary | ICD-10-CM | POA: Diagnosis not present

## 2023-03-23 DIAGNOSIS — D485 Neoplasm of uncertain behavior of skin: Secondary | ICD-10-CM | POA: Diagnosis not present

## 2023-03-23 DIAGNOSIS — L570 Actinic keratosis: Secondary | ICD-10-CM | POA: Diagnosis not present

## 2023-03-23 DIAGNOSIS — Z7189 Other specified counseling: Secondary | ICD-10-CM | POA: Diagnosis not present

## 2023-03-23 DIAGNOSIS — L853 Xerosis cutis: Secondary | ICD-10-CM | POA: Diagnosis not present

## 2023-03-23 DIAGNOSIS — L820 Inflamed seborrheic keratosis: Secondary | ICD-10-CM | POA: Diagnosis not present

## 2023-03-23 DIAGNOSIS — L2989 Other pruritus: Secondary | ICD-10-CM | POA: Diagnosis not present

## 2023-03-23 DIAGNOSIS — L538 Other specified erythematous conditions: Secondary | ICD-10-CM | POA: Diagnosis not present

## 2023-03-23 DIAGNOSIS — Z08 Encounter for follow-up examination after completed treatment for malignant neoplasm: Secondary | ICD-10-CM | POA: Diagnosis not present

## 2023-03-23 DIAGNOSIS — Z85828 Personal history of other malignant neoplasm of skin: Secondary | ICD-10-CM | POA: Diagnosis not present

## 2023-03-27 DIAGNOSIS — I10 Essential (primary) hypertension: Secondary | ICD-10-CM | POA: Diagnosis not present

## 2023-04-02 DIAGNOSIS — E349 Endocrine disorder, unspecified: Secondary | ICD-10-CM | POA: Diagnosis not present

## 2023-04-02 DIAGNOSIS — E7800 Pure hypercholesterolemia, unspecified: Secondary | ICD-10-CM | POA: Diagnosis not present

## 2023-04-02 DIAGNOSIS — E291 Testicular hypofunction: Secondary | ICD-10-CM | POA: Diagnosis not present

## 2023-04-02 DIAGNOSIS — E348 Other specified endocrine disorders: Secondary | ICD-10-CM | POA: Diagnosis not present

## 2023-04-07 DIAGNOSIS — M4180 Other forms of scoliosis, site unspecified: Secondary | ICD-10-CM | POA: Diagnosis not present

## 2023-04-07 DIAGNOSIS — M545 Low back pain, unspecified: Secondary | ICD-10-CM | POA: Diagnosis not present

## 2023-04-07 DIAGNOSIS — M4310 Spondylolisthesis, site unspecified: Secondary | ICD-10-CM | POA: Diagnosis not present

## 2023-04-14 DIAGNOSIS — K5909 Other constipation: Secondary | ICD-10-CM | POA: Diagnosis not present

## 2023-04-14 DIAGNOSIS — C8590 Non-Hodgkin lymphoma, unspecified, unspecified site: Secondary | ICD-10-CM | POA: Diagnosis not present

## 2023-04-14 DIAGNOSIS — Z299 Encounter for prophylactic measures, unspecified: Secondary | ICD-10-CM | POA: Diagnosis not present

## 2023-04-14 DIAGNOSIS — M545 Low back pain, unspecified: Secondary | ICD-10-CM | POA: Diagnosis not present

## 2023-04-14 DIAGNOSIS — I10 Essential (primary) hypertension: Secondary | ICD-10-CM | POA: Diagnosis not present

## 2023-04-16 DIAGNOSIS — E291 Testicular hypofunction: Secondary | ICD-10-CM | POA: Diagnosis not present

## 2023-04-30 DIAGNOSIS — E349 Endocrine disorder, unspecified: Secondary | ICD-10-CM | POA: Diagnosis not present

## 2023-05-06 DIAGNOSIS — I10 Essential (primary) hypertension: Secondary | ICD-10-CM | POA: Diagnosis not present

## 2023-05-06 DIAGNOSIS — Z1339 Encounter for screening examination for other mental health and behavioral disorders: Secondary | ICD-10-CM | POA: Diagnosis not present

## 2023-05-06 DIAGNOSIS — I700 Atherosclerosis of aorta: Secondary | ICD-10-CM | POA: Diagnosis not present

## 2023-05-06 DIAGNOSIS — Z0000 Encounter for general adult medical examination without abnormal findings: Secondary | ICD-10-CM | POA: Diagnosis not present

## 2023-05-06 DIAGNOSIS — Z1331 Encounter for screening for depression: Secondary | ICD-10-CM | POA: Diagnosis not present

## 2023-05-06 DIAGNOSIS — C8590 Non-Hodgkin lymphoma, unspecified, unspecified site: Secondary | ICD-10-CM | POA: Diagnosis not present

## 2023-05-06 DIAGNOSIS — Z299 Encounter for prophylactic measures, unspecified: Secondary | ICD-10-CM | POA: Diagnosis not present

## 2023-05-06 DIAGNOSIS — Z7189 Other specified counseling: Secondary | ICD-10-CM | POA: Diagnosis not present

## 2023-05-19 DIAGNOSIS — R7989 Other specified abnormal findings of blood chemistry: Secondary | ICD-10-CM | POA: Diagnosis not present

## 2023-05-19 DIAGNOSIS — I700 Atherosclerosis of aorta: Secondary | ICD-10-CM | POA: Diagnosis not present

## 2023-05-19 DIAGNOSIS — I10 Essential (primary) hypertension: Secondary | ICD-10-CM | POA: Diagnosis not present

## 2023-05-19 DIAGNOSIS — E291 Testicular hypofunction: Secondary | ICD-10-CM | POA: Diagnosis not present

## 2023-05-26 DIAGNOSIS — M26622 Arthralgia of left temporomandibular joint: Secondary | ICD-10-CM | POA: Diagnosis not present

## 2023-05-27 DIAGNOSIS — I10 Essential (primary) hypertension: Secondary | ICD-10-CM | POA: Diagnosis not present

## 2023-06-03 ENCOUNTER — Ambulatory Visit (HOSPITAL_BASED_OUTPATIENT_CLINIC_OR_DEPARTMENT_OTHER): Payer: Medicare (Managed Care) | Admitting: Psychiatry

## 2023-06-03 ENCOUNTER — Other Ambulatory Visit: Payer: Self-pay

## 2023-06-03 ENCOUNTER — Encounter (HOSPITAL_COMMUNITY): Payer: Self-pay | Admitting: Psychiatry

## 2023-06-03 VITALS — BP 170/98 | HR 73 | Ht 72.0 in | Wt 188.0 lb

## 2023-06-03 DIAGNOSIS — F341 Dysthymic disorder: Secondary | ICD-10-CM

## 2023-06-03 MED ORDER — CLONAZEPAM 0.25 MG PO TBDP: ORAL_TABLET | ORAL | 2 refills | Status: DC

## 2023-06-03 NOTE — Progress Notes (Signed)
 [name], male   DOB: 05-Jul-1950, 73 y.o.   MRN: [id number] [facility] MD Progress Note  06/03/2023 2:31 PM Douglas Bryant  MRN:  [id number] Subjective: Lethargic Principal Problem: Dysthymic disorder   Today we had a long discussion about his psychotropic medications.  Overall the patient says he is doing better.  He is better than he was at his last visit and he said he thinks he is better because he is processed a number of things in his own mind.  He also thinks the medicines have been helpful.  Unfortunately he apparently has had a failed relationship with Mr. Angelene Kelly who is his therapist.  Today we talked about the importance of therapy and he agreed yet he does not seem to be ready to enter into therapy.  He apparently likely is going to have back surgery Douglas Bryant and he is cautious and concerned.  He is seeing multiple different physicians for a number of problems.  He describes tinnitus and dizziness.  The patient admits that he has had a few falls.  The patient shares that he is taking Klonopin  on an erratic basis.  He himself is determined to stop his BuSpar  as he thinks it made his tinnitus worse which is hard to imagine. Past Surgical History:  Procedure Laterality Date   BONE MARROW BIOPSY     COLONOSCOPY N/A 08/12/2018   Procedure: COLONOSCOPY;  Surgeon: Douglas Corporal, MD;  [facility];  Service: Endoscopy;  Laterality: N/A;  1030   HERNIA REPAIR     umblical times 2; inguinal (left)    IR REMOVAL TUN ACCESS W/ PORT W/O FL MOD SED  07/08/2016   POLYPECTOMY  08/12/2018   Procedure: POLYPECTOMY;  Surgeon: Douglas Corporal, MD;  [facility];  Service: Endoscopy;;  colon   port a cath placement     RHINOPLASTY     Family History:  Family History  Problem Relation Age of Onset   Alcohol  abuse Father    Depression Sister    Anxiety disorder Sister    Depression Sister    Anxiety disorder Sister    Family Psychiatric  History:  Social  History:  Social History   Substance and Sexual Activity  Alcohol  Use Yes   Alcohol /week: 6.0 standard drinks of alcohol    Types: 6 Cans of beer per week   Comment: beer occas     Social History   Substance and Sexual Activity  Drug Use No   Frequency: 2.0 times per week   Types: Marijuana   Comment: smokes daily for thirty years. currently takes one puff    Social History   Socioeconomic History   Marital status: Divorced    Spouse name: Not on file   Number of children: Not on file   Years of education: Not on file   Highest education level: Not on file  Occupational History   Not on file  Tobacco Use   Smoking status: Never   Smokeless tobacco: Never  Vaping Use   Vaping status: Never Used  Substance and Sexual Activity   Alcohol  use: Yes    Alcohol /week: 6.0 standard drinks of alcohol     Types: 6 Cans of beer per week    Comment: beer occas   Drug use: No    Frequency: 2.0 times per week    Types: Marijuana    Comment: smokes daily for thirty years. currently takes one puff   Sexual activity: Yes  Partners: Female    Birth control/protection: Condom  Other Topics Concern   Not on file  Social History Narrative   Not on file   Social Drivers of Health   Financial Resource Strain: Not on file  Food Insecurity: Not on file  Transportation Needs: Not on file  Physical Activity: Not on file  Stress: Not on file  Social Connections: Not on file   Additional Social History:                         Sleep: Fair  Appetite:  Good  Current Medications: Current Outpatient Medications  Medication Sig Dispense Refill   acetaminophen  (TYLENOL ) 500 MG tablet Take 1,000 mg by mouth every 6 (six) hours as needed for mild pain.     Ca Carbonate-Mag Hydroxide (ROLAIDS PO) Take 2 tablets by mouth daily as needed (heartburn).     Carboxymethylcellul-Glycerin (LUBRICATING EYE DROPS OP) Place 1 drop into both eyes daily as needed (dry eyes).      clonazePAM  (KLONOPIN ) 0.5 MG tablet 1 bid  1  prn 70 tablet 4   diphenhydrAMINE (BENADRYL) 25 mg capsule Take 25 mg by mouth at bedtime as needed for allergies or sleep.      hydrochlorothiazide  (HYDRODIURIL ) 25 MG tablet Take 1 tablet (25 mg total) by mouth daily. 30 tablet 3   hydrocortisone cream 1 % Apply 1 application topically daily as needed for itching.     ibuprofen (ADVIL) 200 MG tablet Take 600-800 mg by mouth every 6 (six) hours as needed for headache or moderate pain.     levothyroxine (SYNTHROID) 50 MCG tablet Take 50 mcg by mouth daily.     metoprolol  succinate (TOPROL  XL) 25 MG 24 hr tablet Take 1 tablet (25 mg total) by mouth daily. Take with or immediately following a meal. 30 tablet 11   modafinil (PROVIGIL) 200 MG tablet Take 200 mg by mouth daily. Takes 1/2 pill as needed     oxymetazoline (AFRIN) 0.05 % nasal spray Place 1-2 sprays into both nostrils 2 (two) times daily as needed for congestion.     sertraline  (ZOLOFT ) 50 MG tablet Take 1 tablet (50 mg total) by mouth daily. 30 tablet 5   testosterone  cypionate (DEPOTESTOSTERONE CYPIONATE) 200 MG/ML injection Inject 200 mg into the muscle every 28 (twenty-eight) days.     traMADol (ULTRAM) 50 MG tablet      valACYclovir (VALTREX) 500 MG tablet Take 500 mg by mouth daily as needed (Herpes Virus).     zolpidem  (AMBIEN ) 10 MG tablet Take 1 tablet (10 mg total) by mouth at bedtime. 30 tablet 5   No current facility-administered medications for this visit.    Lab Results: No results found for this or any previous visit (from the past 48 hours).  Blood Alcohol  level:  No results found for: "[facility]"  Physical Findings: AIMS:  , ,  ,  ,    CIWA:    COWS:     Musculoskeletal: Strength & Muscle Tone: within normal limits Gait & Station: normal Patient leans: N/A  Psychiatric Specialty Exam: ROS  Blood pressure (!) 170/98, pulse 73, height 6' (1.829 m), weight 188 lb (85.3 kg).Body mass index is 25.5 kg/m.  General  Appearance: Casual  Eye Contact::  Good  Speech:  Clear and Coherent  Volume:  Normal  Mood:mild depression  Affect:  Congruent  Thought Process:  Coherent  Orientation:  Full (Time, Place, and Person)  Thought Content:  WDL  Suicidal Thoughts:  No  Homicidal Thoughts:  No  Memory:  NA  Judgement:  Good  Insight:  Fair  Psychomotor Activity:  Normal  Concentration:  Good  Recall:  Good  Fund of Knowledge:Good  Language: Fair  Akathisia:  No  Handed:  Right  AIMS (if indicated):     Assets:  Desire for Improvement  ADL's:  Intact  Cognition: WNL  Sleep:      Treatment Plan Summary: 06/03/2023,     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  His primary care doctor has given him Zoloft  and he says that it has been helpful in reducing his depression.  I suggested the possibility and he agreed to it to increase his Zoloft  to 100 mg but he says that his primary care doctor can do it.  Today our interventions were to adjust down his Klonopin .  We will change him from taking 0.5 mg twice daily to a newer lower dose of 0.25 mg twice daily with another 0.25 as needed daily as needed.  He will receive 75 pills per month.  We discontinued his BuSpar  per his request.  His second problem is insomnia.  He says the biggest reason that he wakes through the night is because he has to urinate.  I talked to him about the importance of seeing a neurologist but for now will continue his Ambien  10 mg and we consider its use when he comes back in 1 to 2 months.  At that time he likely will have back surgery possibly.  Nonetheless when he returns we will again talk about therapy and eventually likely for this 73 year old man will continue reducing his Klonopin  from using it on a permanent regular basis.  The possibility of using it on a as needed basis is possible.

## 2023-06-26 DIAGNOSIS — E291 Testicular hypofunction: Secondary | ICD-10-CM | POA: Diagnosis not present

## 2023-06-27 DIAGNOSIS — I10 Essential (primary) hypertension: Secondary | ICD-10-CM | POA: Diagnosis not present

## 2023-07-03 ENCOUNTER — Encounter (INDEPENDENT_AMBULATORY_CARE_PROVIDER_SITE_OTHER): Payer: Self-pay | Admitting: *Deleted

## 2023-07-10 DIAGNOSIS — E291 Testicular hypofunction: Secondary | ICD-10-CM | POA: Diagnosis not present

## 2023-07-22 ENCOUNTER — Encounter (HOSPITAL_COMMUNITY): Payer: Self-pay | Admitting: Psychiatry

## 2023-07-22 ENCOUNTER — Ambulatory Visit (HOSPITAL_BASED_OUTPATIENT_CLINIC_OR_DEPARTMENT_OTHER): Payer: Medicare (Managed Care) | Admitting: Psychiatry

## 2023-07-22 ENCOUNTER — Other Ambulatory Visit: Payer: Self-pay

## 2023-07-22 VITALS — BP 156/86 | HR 67 | Ht 73.0 in | Wt 185.0 lb

## 2023-07-22 DIAGNOSIS — F341 Dysthymic disorder: Secondary | ICD-10-CM

## 2023-07-22 MED ORDER — CLONAZEPAM 0.25 MG PO TBDP: ORAL_TABLET | ORAL | 2 refills | Status: DC

## 2023-07-22 MED ORDER — ZOLPIDEM TARTRATE 10 MG PO TABS: 10.0000 mg | ORAL_TABLET | Freq: Every day | ORAL | 5 refills | Status: DC

## 2023-07-22 NOTE — Progress Notes (Signed)
 [name], male   DOB: 02/20/1950, 73 y.o.   MRN: [id number] [facility] MD Progress Note  07/22/2023 2:06 PM Douglas Bryant  MRN:  [id number] Subjective: Lethargic Principal Problem: Dysthymic disorder    Today the patient is seen in the office as he was to return after we were going to reduce his Klonopin .  It turns out there was some confusion at the pharmacy and instead of giving the the lower dose of Klonopin  and they give him his old dose of 0.5 twice daily.  There was some question about whether or not they had a lower dose.  Today we called the pharmacist and clarify they do have the Klonopin  0.25 mg dose and they will dispense it today we will take 1 twice daily and 1 as needed if needed.  Will probably need it is lower dose for a while.  The patient likely is going to have back surgery in the next 6 months.  We will not change his medicine.  His anxiety is reasonably well controlled.  He is sleeping well and he takes Ambien .  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  He understands that the tinnitus is a chronic problem and will be changing.  It should be noted that he could not tolerate or benefit from BuSpar . Past Surgical History:  Procedure Laterality Date   BONE MARROW BIOPSY     COLONOSCOPY N/A 08/12/2018   Procedure: COLONOSCOPY;  Surgeon: Golda Claudis PENNER, MD;  [facility];  Service: Endoscopy;  Laterality: N/A;  1030   HERNIA REPAIR     umblical times 2; inguinal (left)    IR REMOVAL TUN ACCESS W/ PORT W/O FL MOD SED  07/08/2016   POLYPECTOMY  08/12/2018   Procedure: POLYPECTOMY;  Surgeon: Golda Claudis PENNER, MD;  [facility];  Service: Endoscopy;;  colon   port a cath placement     RHINOPLASTY     Family History:  Family History  Problem Relation Age of Onset   Alcohol  abuse Father    Depression Sister    Anxiety disorder Sister    Depression Sister    Anxiety disorder Sister    Family Psychiatric  History:  Social  History:  Social History   Substance and Sexual Activity  Alcohol  Use Yes   Alcohol /week: 6.0 standard drinks of alcohol    Types: 6 Cans of beer per week   Comment: beer occas     Social History   Substance and Sexual Activity  Drug Use No   Frequency: 2.0 times per week   Types: Marijuana   Comment: smokes daily for thirty years. currently takes one puff    Social History   Socioeconomic History   Marital status: Divorced    Spouse name: Not on file   Number of children: Not on file   Years of education: Not on file   Highest education level: Not on file  Occupational History   Not on file  Tobacco Use   Smoking status: Never   Smokeless tobacco: Never  Vaping Use   Vaping status: Never Used  Substance and Sexual Activity   Alcohol  use: Yes    Alcohol /week: 6.0 standard drinks of alcohol     Types: 6 Cans of beer per week    Comment: beer occas   Drug use: No    Frequency: 2.0 times per week    Types: Marijuana    Comment: smokes daily for thirty years. currently  takes one puff   Sexual activity: Yes    Partners: Female    Birth control/protection: Condom  Other Topics Concern   Not on file  Social History Narrative   Not on file   Social Drivers of Health   Financial Resource Strain: Not on file  Food Insecurity: Not on file  Transportation Needs: Not on file  Physical Activity: Not on file  Stress: Not on file  Social Connections: Not on file   Additional Social History:                         Sleep: Fair  Appetite:  Good  Current Medications: Current Outpatient Medications  Medication Sig Dispense Refill   acetaminophen  (TYLENOL ) 500 MG tablet Take 1,000 mg by mouth every 6 (six) hours as needed for mild pain.     Ca Carbonate-Mag Hydroxide (ROLAIDS PO) Take 2 tablets by mouth daily as needed (heartburn).     Carboxymethylcellul-Glycerin (LUBRICATING EYE DROPS OP) Place 1 drop into both eyes daily as needed (dry eyes).      clonazePAM  (KLONOPIN ) 0.5 MG tablet 1 bid  1  prn 70 tablet 4   diphenhydrAMINE (BENADRYL) 25 mg capsule Take 25 mg by mouth at bedtime as needed for allergies or sleep.      hydrochlorothiazide  (HYDRODIURIL ) 25 MG tablet Take 1 tablet (25 mg total) by mouth daily. 30 tablet 3   hydrocortisone cream 1 % Apply 1 application topically daily as needed for itching.     ibuprofen (ADVIL) 200 MG tablet Take 600-800 mg by mouth every 6 (six) hours as needed for headache or moderate pain.     levothyroxine (SYNTHROID) 50 MCG tablet Take 50 mcg by mouth daily.     metoprolol  succinate (TOPROL  XL) 25 MG 24 hr tablet Take 1 tablet (25 mg total) by mouth daily. Take with or immediately following a meal. 30 tablet 11   modafinil (PROVIGIL) 200 MG tablet Take 200 mg by mouth daily. Takes 1/2 pill as needed     oxymetazoline (AFRIN) 0.05 % nasal spray Place 1-2 sprays into both nostrils 2 (two) times daily as needed for congestion.     sertraline  (ZOLOFT ) 50 MG tablet Take 1 tablet (50 mg total) by mouth daily. 30 tablet 5   testosterone  cypionate (DEPOTESTOSTERONE CYPIONATE) 200 MG/ML injection Inject 200 mg into the muscle every 28 (twenty-eight) days.     traMADol (ULTRAM) 50 MG tablet      valACYclovir (VALTREX) 500 MG tablet Take 500 mg by mouth daily as needed (Herpes Virus).     clonazePAM  (KLONOPIN ) 0.25 MG disintegrating tablet 1bid 1 qday prn 75 tablet 2   zolpidem  (AMBIEN ) 10 MG tablet Take 1 tablet (10 mg total) by mouth at bedtime. 30 tablet 5   No current facility-administered medications for this visit.    Lab Results: No results found for this or any previous visit (from the past 48 hours).  Blood Alcohol  level:  No results found for: [facility]  Physical Findings: AIMS:  , ,  ,  ,    CIWA:    COWS:     Musculoskeletal: Strength & Muscle Tone: within normal limits Gait & Station: normal Patient leans: N/A  Psychiatric Specialty Exam: ROS  Blood pressure (!) 156/86, pulse 67, height 6'  1 (1.854 m), weight 185 lb (83.9 kg).Body mass index is 24.41 kg/m.  General Appearance: Casual  Eye Contact::  Good  Speech:  Clear and Coherent  Volume:  Normal  Mood:mild depression  Affect:  Congruent  Thought Process:  Coherent  Orientation:  Full (Time, Place, and Person)  Thought Content:  WDL  Suicidal Thoughts:  No  Homicidal Thoughts:  No  Memory:  NA  Judgement:  Good  Insight:  Fair  Psychomotor Activity:  Normal  Concentration:  Good  Recall:  Good  Fund of Knowledge:Good  Language: Fair  Akathisia:  No  Handed:  Right  AIMS (if indicated):     Assets:  Desire for Improvement  ADL's:  Intact  Cognition: WNL  Sleep:      Treatment Plan Summary: 07/22/2023,    This patient's diagnosis is most likely that of persistent depression disorder.  His primary care doctor gives him 100 mg of Zoloft .  The patient said he was mildly somewhat depressed and I recommended that he speak to his primary care doctor about adjusting up his Zoloft .  I treated for insomnia where he does very well with Ambien  10 mg.  Treated for an adjustment disorder with an anxious mood state.  Today we will clarify that his dose of Klonopin  will be reduced to 0.25 mg twice daily with 1 as needed.  We will likely limit this for a number of months until after he has back surgery.  This 73 year old man lives alone and will be difficult for him to have surgery.  He will return to see me in 3 months.

## 2023-07-23 ENCOUNTER — Telehealth (HOSPITAL_COMMUNITY): Payer: Self-pay

## 2023-07-23 NOTE — Telephone Encounter (Signed)
 Patient is calling today because when he saw Dr. Tasia yesterday his Klonopin  was reduced to 0.25 mg. This comes in a disintegrating tablet and the patient states that it does not have the same effect. I attempted to explain to patient that he has only been taking for less than one day and it is half the dose - but patient cut me off to tell me to just let the doctor know what he said. I advised patient that I would not be able to contact the doctor until he is back in the office on Tuesday and patient was fine with this.

## 2023-07-27 DIAGNOSIS — I10 Essential (primary) hypertension: Secondary | ICD-10-CM | POA: Diagnosis not present

## 2023-07-28 ENCOUNTER — Other Ambulatory Visit (HOSPITAL_COMMUNITY): Payer: Self-pay | Admitting: *Deleted

## 2023-07-28 MED ORDER — CLONAZEPAM 0.5 MG PO TABS: ORAL_TABLET | ORAL | 2 refills | Status: DC

## 2023-07-28 NOTE — Telephone Encounter (Signed)
 Dr Tasia reviewed this message and adjusted the patients medication

## 2023-07-29 DIAGNOSIS — Z299 Encounter for prophylactic measures, unspecified: Secondary | ICD-10-CM | POA: Diagnosis not present

## 2023-07-29 DIAGNOSIS — R52 Pain, unspecified: Secondary | ICD-10-CM | POA: Diagnosis not present

## 2023-07-29 DIAGNOSIS — C8590 Non-Hodgkin lymphoma, unspecified, unspecified site: Secondary | ICD-10-CM | POA: Diagnosis not present

## 2023-07-29 DIAGNOSIS — I10 Essential (primary) hypertension: Secondary | ICD-10-CM | POA: Diagnosis not present

## 2023-07-29 DIAGNOSIS — E291 Testicular hypofunction: Secondary | ICD-10-CM | POA: Diagnosis not present

## 2023-07-29 DIAGNOSIS — H9209 Otalgia, unspecified ear: Secondary | ICD-10-CM | POA: Diagnosis not present

## 2023-08-07 ENCOUNTER — Ambulatory Visit (HOSPITAL_COMMUNITY): Payer: Self-pay | Admitting: Physician Assistant

## 2023-08-10 DIAGNOSIS — Z1379 Encounter for other screening for genetic and chromosomal anomalies: Secondary | ICD-10-CM | POA: Diagnosis not present

## 2023-08-10 DIAGNOSIS — Z1371 Encounter for nonprocreative screening for genetic disease carrier status: Secondary | ICD-10-CM | POA: Diagnosis not present

## 2023-08-10 DIAGNOSIS — Z820 Family history of epilepsy and other diseases of the nervous system: Secondary | ICD-10-CM | POA: Diagnosis not present

## 2023-08-10 DIAGNOSIS — G9089 Other disorders of autonomic nervous system: Secondary | ICD-10-CM | POA: Diagnosis not present

## 2023-08-10 DIAGNOSIS — G43009 Migraine without aura, not intractable, without status migrainosus: Secondary | ICD-10-CM | POA: Diagnosis not present

## 2023-08-11 ENCOUNTER — Other Ambulatory Visit: Payer: Self-pay | Admitting: Thoracic Surgery (Cardiothoracic Vascular Surgery)

## 2023-08-11 DIAGNOSIS — I7121 Aneurysm of the ascending aorta, without rupture: Secondary | ICD-10-CM

## 2023-08-17 DIAGNOSIS — I10 Essential (primary) hypertension: Secondary | ICD-10-CM | POA: Diagnosis not present

## 2023-08-17 DIAGNOSIS — Z79899 Other long term (current) drug therapy: Secondary | ICD-10-CM | POA: Diagnosis not present

## 2023-08-17 DIAGNOSIS — E291 Testicular hypofunction: Secondary | ICD-10-CM | POA: Diagnosis not present

## 2023-08-17 DIAGNOSIS — E7800 Pure hypercholesterolemia, unspecified: Secondary | ICD-10-CM | POA: Diagnosis not present

## 2023-08-17 DIAGNOSIS — Z299 Encounter for prophylactic measures, unspecified: Secondary | ICD-10-CM | POA: Diagnosis not present

## 2023-08-17 DIAGNOSIS — Z0000 Encounter for general adult medical examination without abnormal findings: Secondary | ICD-10-CM | POA: Diagnosis not present

## 2023-08-17 DIAGNOSIS — R5383 Other fatigue: Secondary | ICD-10-CM | POA: Diagnosis not present

## 2023-08-21 DIAGNOSIS — R093 Abnormal sputum: Secondary | ICD-10-CM | POA: Diagnosis not present

## 2023-08-21 DIAGNOSIS — J069 Acute upper respiratory infection, unspecified: Secondary | ICD-10-CM | POA: Diagnosis not present

## 2023-08-21 DIAGNOSIS — C8590 Non-Hodgkin lymphoma, unspecified, unspecified site: Secondary | ICD-10-CM | POA: Diagnosis not present

## 2023-08-21 DIAGNOSIS — R059 Cough, unspecified: Secondary | ICD-10-CM | POA: Diagnosis not present

## 2023-08-21 DIAGNOSIS — Z299 Encounter for prophylactic measures, unspecified: Secondary | ICD-10-CM | POA: Diagnosis not present

## 2023-08-24 ENCOUNTER — Other Ambulatory Visit (HOSPITAL_COMMUNITY): Payer: Self-pay | Admitting: Psychiatry

## 2023-08-26 DIAGNOSIS — Z125 Encounter for screening for malignant neoplasm of prostate: Secondary | ICD-10-CM | POA: Diagnosis not present

## 2023-08-26 DIAGNOSIS — R7989 Other specified abnormal findings of blood chemistry: Secondary | ICD-10-CM | POA: Diagnosis not present

## 2023-08-26 DIAGNOSIS — E7800 Pure hypercholesterolemia, unspecified: Secondary | ICD-10-CM | POA: Diagnosis not present

## 2023-08-26 DIAGNOSIS — Z79899 Other long term (current) drug therapy: Secondary | ICD-10-CM | POA: Diagnosis not present

## 2023-08-26 DIAGNOSIS — R5383 Other fatigue: Secondary | ICD-10-CM | POA: Diagnosis not present

## 2023-08-27 DIAGNOSIS — I10 Essential (primary) hypertension: Secondary | ICD-10-CM | POA: Diagnosis not present

## 2023-08-31 ENCOUNTER — Ambulatory Visit (HOSPITAL_COMMUNITY): Payer: Medicare (Managed Care)

## 2023-09-01 ENCOUNTER — Ambulatory Visit (HOSPITAL_COMMUNITY)
Admission: RE | Admit: 2023-09-01 | Discharge: 2023-09-01 | Disposition: A | Discharge status: Home / Self Care | Payer: Medicare (Managed Care) | Source: Ambulatory Visit | Attending: Thoracic Surgery (Cardiothoracic Vascular Surgery) | Admitting: Thoracic Surgery (Cardiothoracic Vascular Surgery)

## 2023-09-01 DIAGNOSIS — I2510 Atherosclerotic heart disease of native coronary artery without angina pectoris: Secondary | ICD-10-CM | POA: Diagnosis not present

## 2023-09-01 DIAGNOSIS — I7121 Aneurysm of the ascending aorta, without rupture: Secondary | ICD-10-CM | POA: Diagnosis not present

## 2023-09-01 DIAGNOSIS — I712 Thoracic aortic aneurysm, without rupture, unspecified: Secondary | ICD-10-CM | POA: Diagnosis not present

## 2023-09-01 MED ORDER — IOHEXOL 350 MG/ML SOLN
100.0000 mL | Freq: Once | INTRAVENOUS | Status: AC | PRN
  Administered 2023-09-01: 75 mL via INTRAVENOUS

## 2023-09-02 NOTE — Progress Notes (Unsigned)
 [address] [place] [address]             [phone]            Douglas Bryant [id number] 04/22/1950   History of Present Illness: [name] is a 73 year old male with medical history of large B cell lymphoma, depression and OCD returns to the clinic for continued surveillance of ascending thoracic aortic aneurysm.  He was initially diagnosed in 2017 and been followed since. On CTA of chest on 09/01/2023 aneurysm measured 4.4 cm which is stable in size compared to previous year CT scan.  Blood pressure Symptoms exercise    Current Outpatient Medications on File Prior to Visit  Medication Sig Dispense Refill   acetaminophen  (TYLENOL ) 500 MG tablet Take 1,000 mg by mouth every 6 (six) hours as needed for mild pain.     Ca Carbonate-Mag Hydroxide (ROLAIDS PO) Take 2 tablets by mouth daily as needed (heartburn).     Carboxymethylcellul-Glycerin (LUBRICATING EYE DROPS OP) Place 1 drop into both eyes daily as needed (dry eyes).     clonazePAM  (KLONOPIN ) 0.5 MG tablet Take 1/2 tablet (0.25 mg total dose) twice daily. May have 1 additional dose every day PRN for severe anxiety. 45 tablet 2   diphenhydrAMINE (BENADRYL) 25 mg capsule Take 25 mg by mouth at bedtime as needed for allergies or sleep.      hydrochlorothiazide  (HYDRODIURIL ) 25 MG tablet Take 1 tablet (25 mg total) by mouth daily. 30 tablet 3   hydrocortisone cream 1 % Apply 1 application topically daily as needed for itching.     ibuprofen (ADVIL) 200 MG tablet Take 600-800 mg by mouth every 6 (six) hours as needed for headache or moderate pain.     levothyroxine (SYNTHROID) 50 MCG tablet Take 50 mcg by mouth daily.     metoprolol  succinate (TOPROL  XL) 25 MG 24 hr tablet Take 1 tablet (25 mg total) by mouth daily. Take with or immediately following a meal. 30 tablet 11   modafinil (PROVIGIL) 200 MG tablet Take 200 mg by mouth daily. Takes 1/2 pill as needed     oxymetazoline  (AFRIN) 0.05 % nasal spray Place 1-2 sprays into both nostrils 2 (two) times daily as needed for congestion.     sertraline  (ZOLOFT ) 50 MG tablet Take 1 tablet (50 mg total) by mouth daily. 30 tablet 5   testosterone  cypionate (DEPOTESTOSTERONE CYPIONATE) 200 MG/ML injection Inject 200 mg into the muscle every 28 (twenty-eight) days.     traMADol (ULTRAM) 50 MG tablet      valACYclovir (VALTREX) 500 MG tablet Take 500 mg by mouth daily as needed (Herpes Virus).     zolpidem  (AMBIEN ) 10 MG tablet Take 1 tablet (10 mg total) by mouth at bedtime. 30 tablet 5   No current facility-administered medications on file prior to visit.     ROS:   There were no vitals taken for this visit.    Imaging: CLINICAL DATA:  Thoracic aortic aneurysm   EXAM: CT ANGIOGRAPHY CHEST WITH CONTRAST   TECHNIQUE: Multidetector CT imaging of the chest was performed using the standard protocol during bolus administration of intravenous contrast. Multiplanar CT image reconstructions and MIPs were obtained to evaluate the vascular anatomy.   RADIATION DOSE REDUCTION: This exam was performed according to the departmental dose-optimization program which includes automated exposure control, adjustment of the mA and/or kV according to patient size and/or use  of iterative reconstruction technique.   CONTRAST:  75mL OMNIPAQUE  IOHEXOL  350 MG/ML SOLN   COMPARISON:  09/15/2022   FINDINGS: Cardiovascular: There is fusiform dilation of the ascending aorta measuring 4.4 cm in diameter, stable since prior examination. No superimposed intramural hematoma or dissection. Proximal descending thoracic aorta is stable measuring 3.0 cm in diameter. Distal descending thoracic aorta is of normal caliber. Arch vasculature demonstrates classic anatomic configuration is widely patent proximally. Incidentally noted are hemodynamically significant stenoses of the codominant vertebral arteries bilaterally at their origins, not  optimally assessed on this examination.   Extensive multi-vessel coronary artery calcification. Global cardiac size is within normal limits. No pericardial effusion. Central pulmonary arteries are of normal caliber.   Mediastinum/Nodes: No enlarged mediastinal, hilar, or axillary lymph nodes. Thyroid  gland, trachea, and esophagus demonstrate no significant findings.   Lungs/Pleura: Stable chronic lingular collapse. Lungs are otherwise clear. No pneumothorax or pleural effusion. No central obstructing lesion.   Upper Abdomen: No acute abnormality.   Musculoskeletal: No chest wall abnormality. No acute or significant osseous findings.   Review of the MIP images confirms the above findings.   IMPRESSION: 1. Stable fusiform dilation of the ascending aorta measuring 4.4 cm in diameter. Recommend annual imaging followup by CTA or MRA. This recommendation follows 2010 ACCF/AHA/AATS/ACR/ASA/SCA/SCAI/SIR/STS/SVM Guidelines for the Diagnosis and Management of Patients with Thoracic Aortic Disease. Circulation. 2010; 121: Z733-z630. Aortic aneurysm NOS (ICD10-I71.9) 2. Extensive multi-vessel coronary artery calcification. 3. 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 If there is evidence of vertebrobasilar insufficiency, this could be better assessed with dedicated CT arteriography of the neck. 4. Stable chronic lingular collapse.     Electronically Signed   By: Dorethia Molt M.D.   On: 09/01/2023 14:33     A/P: Aneurysm of ascending aorta without rupture (HCC) -4.4 cm ascending thoracic aortic aneurysm. We discussed the natural history and and risk factors for growth of ascending aortic aneurysms. Discussed recommendations to minimize the risk of further expansion or dissection including careful blood pressure control, avoidance of contact sports and heavy lifting, attention to lipid management.  We covered the  importance of smoking ***cessation/staying never user.  The patient does not yet meet surgical criteria of >5.5cm. The patient is aware of signs and symptoms of aortic dissection and when to present to the emergency department    Risk Modification:  Statin:  ***  Smoking cessation instruction/counseling given:  {CHL AMB [facility] SMOKING CESSATION COUNSELING:20758}  Patient was counseled on importance of Blood Pressure Control  They are instructed to contact their Primary Care Physician if they start to have blood pressure readings over 130s/90s. Do not ever stop blood pressure medications on your own, unless instructed by healthcare professional.  Please avoid use of Fluoroquinolones as this can potentially increase your risk of Aortic Rupture and/or Dissection  Patient educated on signs and symptoms of Aortic Dissection, handout also provided in AVS  Manuelita CHRISTELLA Rough, PA-C 09/02/23

## 2023-09-04 ENCOUNTER — Ambulatory Visit: Payer: Medicare (Managed Care)

## 2023-09-04 VITALS — BP 157/93 | HR 69 | Resp 18 | Ht 73.0 in | Wt 190.0 lb

## 2023-09-04 DIAGNOSIS — I7121 Aneurysm of the ascending aorta, without rupture: Secondary | ICD-10-CM | POA: Diagnosis not present

## 2023-09-04 NOTE — Patient Instructions (Signed)

## 2023-09-07 ENCOUNTER — Inpatient Hospital Stay (HOSPITAL_COMMUNITY): Admit: 2023-09-07 | Payer: Medicare (Managed Care) | Admitting: Orthopedic Surgery

## 2023-09-07 SURGERY — ANTERIOR AND POSTERIOR SPINAL FUSION
Anesthesia: General

## 2023-09-09 DIAGNOSIS — Z299 Encounter for prophylactic measures, unspecified: Secondary | ICD-10-CM | POA: Diagnosis not present

## 2023-09-09 DIAGNOSIS — R5383 Other fatigue: Secondary | ICD-10-CM | POA: Diagnosis not present

## 2023-09-09 DIAGNOSIS — E538 Deficiency of other specified B group vitamins: Secondary | ICD-10-CM | POA: Diagnosis not present

## 2023-09-09 DIAGNOSIS — E559 Vitamin D deficiency, unspecified: Secondary | ICD-10-CM | POA: Diagnosis not present

## 2023-09-09 DIAGNOSIS — R7989 Other specified abnormal findings of blood chemistry: Secondary | ICD-10-CM | POA: Diagnosis not present

## 2023-09-09 DIAGNOSIS — J069 Acute upper respiratory infection, unspecified: Secondary | ICD-10-CM | POA: Diagnosis not present

## 2023-09-09 DIAGNOSIS — Z125 Encounter for screening for malignant neoplasm of prostate: Secondary | ICD-10-CM | POA: Diagnosis not present

## 2023-09-11 DIAGNOSIS — I672 Cerebral atherosclerosis: Secondary | ICD-10-CM | POA: Diagnosis not present

## 2023-09-11 DIAGNOSIS — I6523 Occlusion and stenosis of bilateral carotid arteries: Secondary | ICD-10-CM | POA: Diagnosis not present

## 2023-09-11 DIAGNOSIS — I6503 Occlusion and stenosis of bilateral vertebral arteries: Secondary | ICD-10-CM | POA: Diagnosis not present

## 2023-09-11 DIAGNOSIS — R42 Dizziness and giddiness: Secondary | ICD-10-CM | POA: Diagnosis not present

## 2023-09-11 DIAGNOSIS — R519 Headache, unspecified: Secondary | ICD-10-CM | POA: Diagnosis not present

## 2023-09-11 DIAGNOSIS — G450 Vertebro-basilar artery syndrome: Secondary | ICD-10-CM | POA: Diagnosis not present

## 2023-09-11 DIAGNOSIS — Z01812 Encounter for preprocedural laboratory examination: Secondary | ICD-10-CM | POA: Diagnosis not present

## 2023-09-15 ENCOUNTER — Ambulatory Visit (HOSPITAL_COMMUNITY): Payer: Self-pay | Admitting: Physician Assistant

## 2023-09-16 DIAGNOSIS — M545 Low back pain, unspecified: Secondary | ICD-10-CM | POA: Diagnosis not present

## 2023-09-16 DIAGNOSIS — I10 Essential (primary) hypertension: Secondary | ICD-10-CM | POA: Diagnosis not present

## 2023-09-16 DIAGNOSIS — E291 Testicular hypofunction: Secondary | ICD-10-CM | POA: Diagnosis not present

## 2023-09-16 DIAGNOSIS — R52 Pain, unspecified: Secondary | ICD-10-CM | POA: Diagnosis not present

## 2023-09-16 DIAGNOSIS — E538 Deficiency of other specified B group vitamins: Secondary | ICD-10-CM | POA: Diagnosis not present

## 2023-09-16 DIAGNOSIS — Z299 Encounter for prophylactic measures, unspecified: Secondary | ICD-10-CM | POA: Diagnosis not present

## 2023-09-16 DIAGNOSIS — C8590 Non-Hodgkin lymphoma, unspecified, unspecified site: Secondary | ICD-10-CM | POA: Diagnosis not present

## 2023-09-18 DIAGNOSIS — M545 Low back pain, unspecified: Secondary | ICD-10-CM | POA: Diagnosis not present

## 2023-09-18 DIAGNOSIS — M5416 Radiculopathy, lumbar region: Secondary | ICD-10-CM | POA: Diagnosis not present

## 2023-09-22 ENCOUNTER — Ambulatory Visit: Payer: Medicare (Managed Care)

## 2023-09-24 NOTE — Progress Notes (Signed)
 Surgical Instructions   Your procedure is scheduled on Thursday, September 4th. Report to [facility] Main Entrance A at 10:00 A.M., then check in with the Admitting office. Any questions or running late day of surgery: call [phone]  Questions prior to your surgery date: call [phone], Monday-Friday, 8am-4pm. If you experience any cold or flu symptoms such as cough, fever, chills, shortness of breath, etc. between now and your scheduled surgery, please notify us  at the above number.     Remember:  Do not eat after midnight the night before your surgery  You may drink clear liquids until 9:00 the morning of your surgery.   Clear liquids allowed are: Water , Non-Citrus Juices (without pulp), Carbonated Beverages, Clear Tea (no milk, honey, etc.), Black Coffee Only (NO MILK, CREAM OR POWDERED CREAMER of any kind), and Gatorade.    Take these medicines the morning of surgery with A SIP OF WATER   clonazePAM  (KLONOPIN )  diphenhydrAMINE (BENADRYL)  levothyroxine (SYNTHROID)  metoprolol  succinate (TOPROL  XL)  sertraline  (ZOLOFT )    May take these medicines IF NEEDED: acetaminophen  (TYLENOL )  Carboxymethylcellul-Glycerin (LUBRICATING EYE DROPS OP)  famotidine (PEPCID)  fluticasone (FLONASE)  oxymetazoline (AFRIN) nasal spray  valACYclovir (VALTREX)    Follow your surgeon's instructions on when to stop Asprin.  If no instructions were given by your surgeon then you will need to call the office to get those instructions.     One week prior to surgery, STOP taking any Aleve, Naproxen, Motrin, Goody's, BC's, all herbal medications, fish oil, and non-prescription vitamins. This inlcudes your ibuprofen (ADVIL).                     Do NOT Smoke (Tobacco/Vaping) for 24 hours prior to your procedure.  If you use a CPAP at night, you may bring your mask/headgear for your overnight stay.   You will be asked to remove any contacts, glasses, piercing's, hearing aid's,  dentures/partials prior to surgery. Please bring cases for these items if needed.    Patients discharged the day of surgery will not be allowed to drive home, and someone needs to stay with them for 24 hours.  SURGICAL WAITING ROOM VISITATION Patients may have no more than 2 support people in the waiting area - these visitors may rotate.   Pre-op nurse will coordinate an appropriate time for 1 ADULT support person, who may not rotate, to accompany patient in pre-op.  Children under the age of 61 must have an adult with them who is not the patient and must remain in the main waiting area with an adult.  If the patient needs to stay at the hospital during part of their recovery, the visitor guidelines for inpatient rooms apply.  Please refer to the [facility] website for the visitor guidelines for any additional information.   If you received a COVID test during your pre-op visit  it is requested that you wear a mask when out in public, stay away from anyone that may not be feeling well and notify your surgeon if you develop symptoms. If you have been in contact with anyone that has tested positive in the last 10 days please notify you surgeon.      Pre-operative 5 CHG Bathing Instructions   You can play a key role in reducing the risk of infection after surgery. Your skin needs to be as free of germs as possible. You can reduce the number of germs on your skin by washing with CHG (chlorhexidine gluconate) soap  before surgery. CHG is an antiseptic soap that kills germs and continues to kill germs even after washing.   DO NOT use if you have an allergy to chlorhexidine/CHG or antibacterial soaps. If your skin becomes reddened or irritated, stop using the CHG and notify one of our RNs at [phone].   Please shower with the CHG soap starting 4 days before surgery using the following schedule:     Please keep in mind the following:  DO NOT shave, including legs and underarms, starting the  day of your first shower.   You may shave your face at any point before/day of surgery.  Place clean sheets on your bed the day you start using CHG soap. Use a clean washcloth (not used since being washed) for each shower. DO NOT sleep with pets once you start using the CHG.   CHG Shower Instructions:  Wash your face and private area with normal soap. If you choose to wash your hair, wash first with your normal shampoo.  After you use shampoo/soap, rinse your hair and body thoroughly to remove shampoo/soap residue.  Turn the water  OFF and apply about 3 tablespoons (45 ml) of CHG soap to a CLEAN washcloth.  Apply CHG soap ONLY FROM YOUR NECK DOWN TO YOUR TOES (washing for 3-5 minutes)  DO NOT use CHG soap on face, private areas, open wounds, or sores.  Pay special attention to the area where your surgery is being performed.  If you are having back surgery, having someone wash your back for you may be helpful. Wait 2 minutes after CHG soap is applied, then you may rinse off the CHG soap.  Pat dry with a clean towel  Put on clean clothes/pajamas   If you choose to wear lotion, please use ONLY the CHG-compatible lotions that are listed below.  Additional instructions for the day of surgery: DO NOT APPLY any lotions, deodorants, cologne, or perfumes.   Do not bring valuables to the hospital. [facility] is not responsible for any belongings/valuables. Do not wear nail polish, gel polish, artificial nails, or any other type of covering on natural nails (fingers and toes) Do not wear jewelry or makeup Put on clean/comfortable clothes.  Please brush your teeth.  Ask your nurse before applying any prescription medications to the skin.     CHG Compatible Lotions   Aveeno Moisturizing lotion  Cetaphil Moisturizing Cream  Cetaphil Moisturizing Lotion  Clairol Herbal Essence Moisturizing Lotion, Dry Skin  Clairol Herbal Essence Moisturizing Lotion, Extra Dry Skin  Clairol Herbal Essence  Moisturizing Lotion, Normal Skin  Curel Age Defying Therapeutic Moisturizing Lotion with Alpha Hydroxy  Curel Extreme Care Body Lotion  Curel Soothing Hands Moisturizing Hand Lotion  Curel Therapeutic Moisturizing Cream, Fragrance-Free  Curel Therapeutic Moisturizing Lotion, Fragrance-Free  Curel Therapeutic Moisturizing Lotion, Original Formula  Eucerin Daily Replenishing Lotion  Eucerin Dry Skin Therapy Plus Alpha Hydroxy Crme  Eucerin Dry Skin Therapy Plus Alpha Hydroxy Lotion  Eucerin Original Crme  Eucerin Original Lotion  Eucerin Plus Crme Eucerin Plus Lotion  Eucerin TriLipid Replenishing Lotion  Keri Anti-Bacterial Hand Lotion  Keri Deep Conditioning Original Lotion Dry Skin Formula Softly Scented  Keri Deep Conditioning Original Lotion, Fragrance Free Sensitive Skin Formula  Keri Lotion Fast Absorbing Fragrance Free Sensitive Skin Formula  Keri Lotion Fast Absorbing Softly Scented Dry Skin Formula  Keri Original Lotion  Keri Skin Renewal Lotion Keri Silky Smooth Lotion  Keri Silky Smooth Sensitive Skin Lotion  Nivea Body Creamy Conditioning Oil  Nivea Body Extra Enriched Teacher, adult education Moisturizing Lotion Nivea Crme  Nivea Skin Firming Lotion  NutraDerm 30 Skin Lotion  NutraDerm Skin Lotion  NutraDerm Therapeutic Skin Cream  NutraDerm Therapeutic Skin Lotion  ProShield Protective Hand Cream  Provon moisturizing lotion  Please read over the following fact sheets that you were given.

## 2023-09-25 ENCOUNTER — Other Ambulatory Visit: Payer: Self-pay

## 2023-09-25 ENCOUNTER — Encounter (HOSPITAL_COMMUNITY)
Admission: RE | Admit: 2023-09-25 | Discharge: 2023-09-25 | Disposition: A | Discharge status: Home / Self Care | Payer: Medicare (Managed Care) | Source: Ambulatory Visit | Attending: Orthopedic Surgery | Admitting: Orthopedic Surgery

## 2023-09-25 ENCOUNTER — Encounter (HOSPITAL_COMMUNITY): Payer: Self-pay

## 2023-09-25 VITALS — BP 133/78 | HR 67 | Temp 98.1°F | Resp 18 | Ht 72.0 in | Wt 186.3 lb

## 2023-09-25 DIAGNOSIS — Z01812 Encounter for preprocedural laboratory examination: Secondary | ICD-10-CM | POA: Diagnosis present

## 2023-09-25 DIAGNOSIS — Z01818 Encounter for other preprocedural examination: Secondary | ICD-10-CM | POA: Insufficient documentation

## 2023-09-25 DIAGNOSIS — Z01810 Encounter for preprocedural cardiovascular examination: Secondary | ICD-10-CM | POA: Diagnosis present

## 2023-09-25 LAB — CBC
HCT: 46.1 % (ref 39.0–52.0)
Hemoglobin: 16.5 g/dL (ref 13.0–17.0)
MCH: 33.3 pg (ref 26.0–34.0)
MCHC: 35.8 g/dL (ref 30.0–36.0)
MCV: 93.1 fL (ref 80.0–100.0)
Platelets: 243 K/uL (ref 150–400)
RBC: 4.95 MIL/uL (ref 4.22–5.81)
RDW: 13.2 % (ref 11.5–15.5)
WBC: 10.7 K/uL — ABNORMAL HIGH (ref 4.0–10.5)
nRBC: 0 % (ref 0.0–0.2)

## 2023-09-25 LAB — TYPE AND SCREEN
ABO/RH(D): A NEG
Antibody Screen: NEGATIVE

## 2023-09-25 LAB — BASIC METABOLIC PANEL WITH GFR
Anion gap: 12 (ref 5–15)
BUN: 15 mg/dL (ref 8–23)
CO2: 25 mmol/L (ref 22–32)
Calcium: 9.3 mg/dL (ref 8.9–10.3)
Chloride: 97 mmol/L — ABNORMAL LOW (ref 98–111)
Creatinine, Ser: 0.74 mg/dL (ref 0.61–1.24)
GFR, Estimated: >60 mL/min (ref >60)
Glucose, Bld: 84 mg/dL (ref 70–99)
Potassium: 3.6 mmol/L (ref 3.5–5.1)
Sodium: 134 mmol/L — ABNORMAL LOW (ref 135–145)

## 2023-09-25 LAB — SURGICAL PCR SCREEN
MRSA, PCR: NEGATIVE
Staphylococcus aureus: NEGATIVE

## 2023-09-25 NOTE — Progress Notes (Signed)
 PCP - Dr. Eligio Fairly Cardiologist -  Manuelita Rough NP LOV 09/04/23 with follow up in 1 year  PPM/ICD - Denies Device Orders - n/a Rep Notified - n/a  Chest x-ray - N/A EKG - 09/25/23 Stress Test - denies ECHO - 11-14-16 Cardiac Cath - denies  Sleep Study - denies CPAP - n/a  Non-diabetic  Last dose of GLP1 agonist-  denies GLP1 instructions: n/a  Blood Thinner Instructions: denies Aspirin Instructions: patient states last dose was on 09/24/23  ERAS Protcol - clears until 0900 PRE-SURGERY Ensure or G2- n/a  COVID TEST- n/a   Anesthesia review: Yes, HTN, PVD, Aneurysm   Patient denies shortness of breath, fever, cough and chest pain at PAT appointment. Patient denies any respiratory issues at this time.    All instructions explained to the patient, with a verbal understanding of the material. Patient agrees to go over the instructions while at home for a better understanding. Patient also instructed to self quarantine after being tested for COVID-19. The opportunity to ask questions was provided.

## 2023-09-26 DIAGNOSIS — I10 Essential (primary) hypertension: Secondary | ICD-10-CM | POA: Diagnosis not present

## 2023-10-01 ENCOUNTER — Inpatient Hospital Stay (HOSPITAL_COMMUNITY)
Admission: RE | Admit: 2023-10-01 | Discharge: 2023-10-03 | DRG: 458 | Disposition: A | Discharge status: Home / Self Care | Payer: Medicare (Managed Care) | Attending: Orthopedic Surgery | Admitting: Orthopedic Surgery

## 2023-10-01 ENCOUNTER — Other Ambulatory Visit: Payer: Self-pay

## 2023-10-01 ENCOUNTER — Inpatient Hospital Stay (HOSPITAL_COMMUNITY)
Admission: RE | Disposition: A | Discharge status: Home / Self Care | Payer: Self-pay | Source: Home / Self Care | Attending: Orthopedic Surgery

## 2023-10-01 ENCOUNTER — Inpatient Hospital Stay (HOSPITAL_COMMUNITY): Payer: Medicare (Managed Care)

## 2023-10-01 ENCOUNTER — Inpatient Hospital Stay (HOSPITAL_COMMUNITY): Payer: Medicare (Managed Care) | Admitting: Physician Assistant

## 2023-10-01 ENCOUNTER — Encounter (HOSPITAL_COMMUNITY): Payer: Self-pay | Admitting: Orthopedic Surgery

## 2023-10-01 ENCOUNTER — Inpatient Hospital Stay (HOSPITAL_COMMUNITY): Payer: Medicare (Managed Care) | Admitting: Anesthesiology

## 2023-10-01 DIAGNOSIS — M5416 Radiculopathy, lumbar region: Secondary | ICD-10-CM | POA: Diagnosis not present

## 2023-10-01 DIAGNOSIS — M5116 Intervertebral disc disorders with radiculopathy, lumbar region: Principal | ICD-10-CM | POA: Diagnosis present

## 2023-10-01 DIAGNOSIS — E039 Hypothyroidism, unspecified: Secondary | ICD-10-CM | POA: Diagnosis present

## 2023-10-01 DIAGNOSIS — M4156 Other secondary scoliosis, lumbar region: Secondary | ICD-10-CM | POA: Diagnosis not present

## 2023-10-01 DIAGNOSIS — I739 Peripheral vascular disease, unspecified: Secondary | ICD-10-CM | POA: Diagnosis not present

## 2023-10-01 DIAGNOSIS — M4316 Spondylolisthesis, lumbar region: Secondary | ICD-10-CM | POA: Diagnosis present

## 2023-10-01 DIAGNOSIS — Z888 Allergy status to other drugs, medicaments and biological substances status: Secondary | ICD-10-CM | POA: Diagnosis not present

## 2023-10-01 DIAGNOSIS — I10 Essential (primary) hypertension: Secondary | ICD-10-CM | POA: Diagnosis not present

## 2023-10-01 DIAGNOSIS — M48061 Spinal stenosis, lumbar region without neurogenic claudication: Secondary | ICD-10-CM | POA: Diagnosis present

## 2023-10-01 DIAGNOSIS — Z79899 Other long term (current) drug therapy: Secondary | ICD-10-CM | POA: Diagnosis not present

## 2023-10-01 DIAGNOSIS — F418 Other specified anxiety disorders: Secondary | ICD-10-CM

## 2023-10-01 DIAGNOSIS — Z8572 Personal history of non-Hodgkin lymphomas: Secondary | ICD-10-CM

## 2023-10-01 DIAGNOSIS — Z981 Arthrodesis status: Principal | ICD-10-CM

## 2023-10-01 DIAGNOSIS — M51362 Other intervertebral disc degeneration, lumbar region with discogenic back pain and lower extremity pain: Secondary | ICD-10-CM | POA: Diagnosis present

## 2023-10-01 DIAGNOSIS — Z9221 Personal history of antineoplastic chemotherapy: Secondary | ICD-10-CM

## 2023-10-01 DIAGNOSIS — F419 Anxiety disorder, unspecified: Secondary | ICD-10-CM | POA: Diagnosis present

## 2023-10-01 DIAGNOSIS — Z79890 Hormone replacement therapy (postmenopausal): Secondary | ICD-10-CM

## 2023-10-01 DIAGNOSIS — Z0189 Encounter for other specified special examinations: Secondary | ICD-10-CM | POA: Diagnosis not present

## 2023-10-01 HISTORY — DX: Cannabis use, unspecified, uncomplicated: F12.90

## 2023-10-01 HISTORY — PX: ANTERIOR AND POSTERIOR SPINAL FUSION: SHX2259

## 2023-10-01 HISTORY — DX: Constipation, unspecified: K59.00

## 2023-10-01 LAB — ABO/RH: ABO/RH(D): A NEG

## 2023-10-01 SURGERY — ANTERIOR AND POSTERIOR SPINAL FUSION
Anesthesia: General | Site: Spine Lumbar

## 2023-10-01 MED ORDER — ORAL CARE MOUTH RINSE: 15.0000 mL | Freq: Once | OROMUCOSAL | Status: AC

## 2023-10-01 MED ORDER — OXYCODONE HCL 5 MG PO TABS: 5.0000 mg | ORAL_TABLET | Freq: Once | ORAL | Status: DC | PRN

## 2023-10-01 MED ORDER — FENTANYL CITRATE (PF) 100 MCG/2ML IJ SOLN
INTRAMUSCULAR | Status: AC
  Filled 2023-10-01: qty 2

## 2023-10-01 MED ORDER — ONDANSETRON HCL 4 MG/2ML IJ SOLN
4.0000 mg | Freq: Four times a day (QID) | INTRAMUSCULAR | Status: DC | PRN
  Administered 2023-10-01: 4 mg via INTRAVENOUS
  Filled 2023-10-01: qty 2

## 2023-10-01 MED ORDER — HYDROMORPHONE HCL 1 MG/ML IJ SOLN
1.0000 mg | INTRAMUSCULAR | Status: AC | PRN
  Administered 2023-10-01: 1 mg via INTRAVENOUS
  Filled 2023-10-01: qty 1

## 2023-10-01 MED ORDER — SODIUM CHLORIDE 0.9 % IV SOLN
0.1500 ug/kg/min | INTRAVENOUS | Status: DC
  Filled 2023-10-01: qty 2000

## 2023-10-01 MED ORDER — SODIUM CHLORIDE 0.9 % IV SOLN
0.1500 ug/kg/min | INTRAVENOUS | Status: AC
  Administered 2023-10-01: .1 ug/kg/min via INTRAVENOUS
  Filled 2023-10-01: qty 2000

## 2023-10-01 MED ORDER — PHENYLEPHRINE HCL-NACL 20-0.9 MG/250ML-% IV SOLN
INTRAVENOUS | Status: DC | PRN
  Administered 2023-10-01: 40 ug/min via INTRAVENOUS

## 2023-10-01 MED ORDER — SODIUM CHLORIDE 0.9% FLUSH: 3.0000 mL | INTRAVENOUS | Status: DC | PRN

## 2023-10-01 MED ORDER — SUCCINYLCHOLINE CHLORIDE 200 MG/10ML IV SOSY
PREFILLED_SYRINGE | INTRAVENOUS | Status: AC
  Filled 2023-10-01: qty 10

## 2023-10-01 MED ORDER — HEMOSTATIC AGENTS (NO CHARGE) OPTIME
TOPICAL | Status: DC | PRN
  Administered 2023-10-01 (×2): 1 via TOPICAL

## 2023-10-01 MED ORDER — SUCCINYLCHOLINE CHLORIDE 200 MG/10ML IV SOSY
PREFILLED_SYRINGE | INTRAVENOUS | Status: DC | PRN
  Administered 2023-10-01: 140 mg via INTRAVENOUS

## 2023-10-01 MED ORDER — METHOCARBAMOL 500 MG PO TABS: 500.0000 mg | ORAL_TABLET | Freq: Three times a day (TID) | ORAL | 0 refills | Status: AC | PRN

## 2023-10-01 MED ORDER — DEXAMETHASONE SODIUM PHOSPHATE 10 MG/ML IJ SOLN
INTRAMUSCULAR | Status: DC | PRN
  Administered 2023-10-01: 10 mg via INTRAVENOUS

## 2023-10-01 MED ORDER — PHENOL 1.4 % MT LIQD: 1.0000 | OROMUCOSAL | Status: DC | PRN

## 2023-10-01 MED ORDER — ONDANSETRON HCL 4 MG/2ML IJ SOLN
INTRAMUSCULAR | Status: AC
  Filled 2023-10-01: qty 2

## 2023-10-01 MED ORDER — LIDOCAINE 2% (20 MG/ML) 5 ML SYRINGE
INTRAMUSCULAR | Status: DC | PRN
  Administered 2023-10-01: 60 mg via INTRAVENOUS

## 2023-10-01 MED ORDER — METOPROLOL SUCCINATE ER 25 MG PO TB24
25.0000 mg | ORAL_TABLET | Freq: Every day | ORAL | Status: DC
  Administered 2023-10-01 – 2023-10-02 (×2): 25 mg via ORAL
  Filled 2023-10-01 (×2): qty 1

## 2023-10-01 MED ORDER — OXYCODONE-ACETAMINOPHEN 10-325 MG PO TABS: 1.0000 | ORAL_TABLET | Freq: Four times a day (QID) | ORAL | 0 refills | Status: AC | PRN

## 2023-10-01 MED ORDER — THROMBIN 20000 UNITS EX SOLR
CUTANEOUS | Status: AC
  Filled 2023-10-01: qty 20000

## 2023-10-01 MED ORDER — HYDROCHLOROTHIAZIDE 25 MG PO TABS
25.0000 mg | ORAL_TABLET | Freq: Every day | ORAL | Status: DC
  Administered 2023-10-01 – 2023-10-02 (×2): 25 mg via ORAL
  Filled 2023-10-01 (×2): qty 1

## 2023-10-01 MED ORDER — MENTHOL 3 MG MT LOZG: 1.0000 | LOZENGE | OROMUCOSAL | Status: DC | PRN

## 2023-10-01 MED ORDER — ONDANSETRON HCL 4 MG/2ML IJ SOLN
INTRAMUSCULAR | Status: DC | PRN
  Administered 2023-10-01: 4 mg via INTRAVENOUS

## 2023-10-01 MED ORDER — SODIUM CHLORIDE 0.9% FLUSH
3.0000 mL | Freq: Two times a day (BID) | INTRAVENOUS | Status: DC
  Administered 2023-10-01: 3 mL via INTRAVENOUS

## 2023-10-01 MED ORDER — ACETAMINOPHEN 500 MG PO TABS
ORAL_TABLET | ORAL | Status: AC
  Administered 2023-10-01: 1000 mg via ORAL
  Filled 2023-10-01: qty 2

## 2023-10-01 MED ORDER — 0.9 % SODIUM CHLORIDE (POUR BTL) OPTIME
TOPICAL | Status: DC | PRN
  Administered 2023-10-01 (×2): 1000 mL

## 2023-10-01 MED ORDER — SERTRALINE HCL 50 MG PO TABS: 50.0000 mg | ORAL_TABLET | Freq: Every day | ORAL | Status: DC | PRN

## 2023-10-01 MED ORDER — CEFAZOLIN SODIUM-DEXTROSE 2-4 GM/100ML-% IV SOLN
INTRAVENOUS | Status: AC
  Filled 2023-10-01: qty 100

## 2023-10-01 MED ORDER — KETAMINE HCL 10 MG/ML IJ SOLN
INTRAMUSCULAR | Status: DC | PRN
Start: 2023-10-01 — End: 2023-10-01
  Administered 2023-10-01 (×2): 10 mg via INTRAVENOUS

## 2023-10-01 MED ORDER — PROPOFOL 10 MG/ML IV BOLUS
INTRAVENOUS | Status: AC
  Filled 2023-10-01: qty 20

## 2023-10-01 MED ORDER — CLONAZEPAM 0.5 MG PO TABS
0.5000 mg | ORAL_TABLET | Freq: Two times a day (BID) | ORAL | Status: DC
  Administered 2023-10-01 – 2023-10-02 (×3): 0.5 mg via ORAL
  Filled 2023-10-01 (×3): qty 1

## 2023-10-01 MED ORDER — LACTATED RINGERS IV SOLN: INTRAVENOUS | Status: DC

## 2023-10-01 MED ORDER — FENTANYL CITRATE (PF) 250 MCG/5ML IJ SOLN
INTRAMUSCULAR | Status: DC | PRN
  Administered 2023-10-01: 100 ug via INTRAVENOUS

## 2023-10-01 MED ORDER — ONDANSETRON HCL 4 MG PO TABS
4.0000 mg | ORAL_TABLET | Freq: Four times a day (QID) | ORAL | Status: DC | PRN
  Administered 2023-10-02: 4 mg via ORAL
  Filled 2023-10-01: qty 1

## 2023-10-01 MED ORDER — FENTANYL CITRATE (PF) 100 MCG/2ML IJ SOLN
25.0000 ug | INTRAMUSCULAR | Status: DC | PRN
  Administered 2023-10-01 (×2): 25 ug via INTRAVENOUS
  Administered 2023-10-01 (×2): 50 ug via INTRAVENOUS

## 2023-10-01 MED ORDER — POLYETHYLENE GLYCOL 3350 17 G PO PACK
17.0000 g | PACK | Freq: Every day | ORAL | Status: DC | PRN
  Filled 2023-10-01: qty 1

## 2023-10-01 MED ORDER — GLYCOPYRROLATE PF 0.2 MG/ML IJ SOSY
PREFILLED_SYRINGE | INTRAMUSCULAR | Status: AC
  Filled 2023-10-01: qty 1

## 2023-10-01 MED ORDER — TRANEXAMIC ACID-NACL 1000-0.7 MG/100ML-% IV SOLN
INTRAVENOUS | Status: AC
  Filled 2023-10-01: qty 100

## 2023-10-01 MED ORDER — DEXAMETHASONE SODIUM PHOSPHATE 10 MG/ML IJ SOLN
INTRAMUSCULAR | Status: AC
  Filled 2023-10-01: qty 1

## 2023-10-01 MED ORDER — CEFAZOLIN SODIUM-DEXTROSE 1-4 GM/50ML-% IV SOLN
1.0000 g | Freq: Three times a day (TID) | INTRAVENOUS | Status: AC
  Administered 2023-10-01 – 2023-10-02 (×2): 1 g via INTRAVENOUS
  Filled 2023-10-01 (×2): qty 50

## 2023-10-01 MED ORDER — OXYCODONE HCL 5 MG/5ML PO SOLN: 5.0000 mg | Freq: Once | ORAL | Status: DC | PRN

## 2023-10-01 MED ORDER — DOCUSATE SODIUM 100 MG PO CAPS
100.0000 mg | ORAL_CAPSULE | Freq: Two times a day (BID) | ORAL | Status: DC | PRN
  Administered 2023-10-01: 100 mg via ORAL
  Filled 2023-10-01: qty 1

## 2023-10-01 MED ORDER — BUPIVACAINE-EPINEPHRINE (PF) 0.25% -1:200000 IJ SOLN
INTRAMUSCULAR | Status: AC
  Filled 2023-10-01: qty 30

## 2023-10-01 MED ORDER — ONDANSETRON HCL 4 MG PO TABS: 4.0000 mg | ORAL_TABLET | Freq: Three times a day (TID) | ORAL | 0 refills | Status: DC | PRN

## 2023-10-01 MED ORDER — TRANEXAMIC ACID-NACL 1000-0.7 MG/100ML-% IV SOLN
1000.0000 mg | INTRAVENOUS | Status: AC
  Administered 2023-10-01: 1000 mg via INTRAVENOUS

## 2023-10-01 MED ORDER — ACETAMINOPHEN 325 MG PO TABS: 650.0000 mg | ORAL_TABLET | ORAL | Status: DC | PRN

## 2023-10-01 MED ORDER — CHLORHEXIDINE GLUCONATE 0.12 % MT SOLN
OROMUCOSAL | Status: AC
  Administered 2023-10-01: 15 mL via OROMUCOSAL
  Filled 2023-10-01: qty 15

## 2023-10-01 MED ORDER — MAGNESIUM CITRATE PO SOLN
1.0000 | Freq: Once | ORAL | Status: AC | PRN
  Administered 2023-10-01: 1 via ORAL
  Filled 2023-10-01: qty 296

## 2023-10-01 MED ORDER — PROPOFOL 500 MG/50ML IV EMUL
INTRAVENOUS | Status: DC | PRN
  Administered 2023-10-01: 150 ug/kg/min via INTRAVENOUS
  Administered 2023-10-01: 200 ug/kg/min via INTRAVENOUS
  Administered 2023-10-01: 180 ug/kg/min via INTRAVENOUS

## 2023-10-01 MED ORDER — ONDANSETRON HCL 4 MG/2ML IJ SOLN: 4.0000 mg | Freq: Once | INTRAMUSCULAR | Status: DC | PRN

## 2023-10-01 MED ORDER — LEVOTHYROXINE SODIUM 25 MCG PO TABS
50.0000 ug | ORAL_TABLET | Freq: Every day | ORAL | Status: DC
  Administered 2023-10-02 – 2023-10-03 (×2): 50 ug via ORAL
  Filled 2023-10-01 (×2): qty 2

## 2023-10-01 MED ORDER — GLYCOPYRROLATE 0.2 MG/ML IJ SOLN
INTRAMUSCULAR | Status: DC | PRN
  Administered 2023-10-01: .2 mg via INTRAVENOUS

## 2023-10-01 MED ORDER — OXYCODONE HCL 5 MG PO TABS
5.0000 mg | ORAL_TABLET | ORAL | Status: DC | PRN
  Administered 2023-10-02: 5 mg via ORAL

## 2023-10-01 MED ORDER — THROMBIN 20000 UNITS EX KIT
PACK | CUTANEOUS | Status: DC | PRN
  Administered 2023-10-01: 20 mL via TOPICAL

## 2023-10-01 MED ORDER — SODIUM CHLORIDE 0.9 % IV SOLN: 250.0000 mL | INTRAVENOUS | Status: AC

## 2023-10-01 MED ORDER — CEFAZOLIN SODIUM-DEXTROSE 2-4 GM/100ML-% IV SOLN
2.0000 g | INTRAVENOUS | Status: AC
  Administered 2023-10-01: 2 g via INTRAVENOUS

## 2023-10-01 MED ORDER — ACETAMINOPHEN 650 MG RE SUPP: 650.0000 mg | RECTAL | Status: DC | PRN

## 2023-10-01 MED ORDER — FENTANYL CITRATE (PF) 250 MCG/5ML IJ SOLN
INTRAMUSCULAR | Status: AC
  Filled 2023-10-01: qty 5

## 2023-10-01 MED ORDER — METHOCARBAMOL 500 MG PO TABS
500.0000 mg | ORAL_TABLET | Freq: Four times a day (QID) | ORAL | Status: DC | PRN
  Administered 2023-10-01 – 2023-10-03 (×5): 500 mg via ORAL
  Filled 2023-10-01 (×6): qty 1

## 2023-10-01 MED ORDER — KETAMINE HCL 50 MG/5ML IJ SOSY
PREFILLED_SYRINGE | INTRAMUSCULAR | Status: AC
  Filled 2023-10-01: qty 5

## 2023-10-01 MED ORDER — DEXMEDETOMIDINE HCL IN NACL 80 MCG/20ML IV SOLN
INTRAVENOUS | Status: DC | PRN
  Administered 2023-10-01 (×2): 4 ug via INTRAVENOUS

## 2023-10-01 MED ORDER — METHOCARBAMOL 1000 MG/10ML IJ SOLN: 500.0000 mg | Freq: Four times a day (QID) | INTRAMUSCULAR | Status: DC | PRN

## 2023-10-01 MED ORDER — CHLORHEXIDINE GLUCONATE 0.12 % MT SOLN: 15.0000 mL | Freq: Once | OROMUCOSAL | Status: AC

## 2023-10-01 MED ORDER — PROPOFOL 1000 MG/100ML IV EMUL
INTRAVENOUS | Status: AC
  Filled 2023-10-01: qty 300

## 2023-10-01 MED ORDER — BUPIVACAINE-EPINEPHRINE 0.25% -1:200000 IJ SOLN
INTRAMUSCULAR | Status: DC | PRN
  Administered 2023-10-01: 20 mL
  Administered 2023-10-01: 10 mL

## 2023-10-01 MED ORDER — OXYCODONE HCL 5 MG PO TABS
10.0000 mg | ORAL_TABLET | ORAL | Status: DC | PRN
  Administered 2023-10-01 – 2023-10-03 (×8): 10 mg via ORAL
  Filled 2023-10-01 (×10): qty 2

## 2023-10-01 MED ORDER — LIDOCAINE 2% (20 MG/ML) 5 ML SYRINGE
INTRAMUSCULAR | Status: AC
  Filled 2023-10-01: qty 5

## 2023-10-01 MED ORDER — ACETAMINOPHEN 500 MG PO TABS: 1000.0000 mg | ORAL_TABLET | Freq: Once | ORAL | Status: AC

## 2023-10-01 SURGICAL SUPPLY — 79 items
ALLOGRFT BNE OSSIFUSE FBR 5CC (Bone Implant) IMPLANT
BAG COUNTER SPONGE SURGICOUNT (BAG) ×1 IMPLANT
BENZOIN TINCTURE PRP APPL 2/3 (GAUZE/BANDAGES/DRESSINGS) IMPLANT
BLADE CLIPPER SURG (BLADE) IMPLANT
BLADE SURG 10 STRL SS (BLADE) ×2 IMPLANT
CLIP APPLIE 11 MED OPEN (CLIP) ×1 IMPLANT
CLSR STERI-STRIP ANTIMIC 1/2X4 (GAUZE/BANDAGES/DRESSINGS) IMPLANT
CORD BIPOLAR FORCEPS 12FT (ELECTRODE) ×1 IMPLANT
COVER SURGICAL LIGHT HANDLE (MISCELLANEOUS) ×2 IMPLANT
DRAPE C-ARM 42X72 X-RAY (DRAPES) ×2 IMPLANT
DRAPE C-ARMOR (DRAPES) ×2 IMPLANT
DRAPE INCISE IOBAN 66X45 STRL (DRAPES) ×1 IMPLANT
DRAPE LAPAROTOMY T 102X78X121 (DRAPES) ×1 IMPLANT
DRAPE SURG 17X23 STRL (DRAPES) ×2 IMPLANT
DRAPE U-SHAPE 47X51 STRL (DRAPES) ×2 IMPLANT
DRSG AQUACEL AG ADV 3.5X 6 (GAUZE/BANDAGES/DRESSINGS) ×2 IMPLANT
DRSG OPSITE POSTOP 4X6 (GAUZE/BANDAGES/DRESSINGS) IMPLANT
DRSG OPSITE POSTOP 4X8 (GAUZE/BANDAGES/DRESSINGS) IMPLANT
DURAPREP 26ML APPLICATOR (WOUND CARE) ×2 IMPLANT
ELECT CAUTERY BLADE 6.4 (BLADE) ×1 IMPLANT
ELECT NVM5 SURFACE MEP/EMG (ELECTRODE) IMPLANT
ELECT PENCIL ROCKER SW 15FT (MISCELLANEOUS) ×2 IMPLANT
ELECTRODE BLDE 4.0 EZ CLN MEGD (MISCELLANEOUS) ×1 IMPLANT
ELECTRODE REM PT RTRN 9FT ADLT (ELECTROSURGICAL) ×2 IMPLANT
GLOVE BIO SURGEON STRL SZ 6.5 (GLOVE) ×2 IMPLANT
GLOVE BIO SURGEON STRL SZ7.5 (GLOVE) IMPLANT
GLOVE BIOGEL PI IND STRL 6.5 (GLOVE) ×2 IMPLANT
GLOVE BIOGEL PI IND STRL 8 (GLOVE) IMPLANT
GLOVE BIOGEL PI IND STRL 8.5 (GLOVE) ×2 IMPLANT
GLOVE OPTIFIT SS 7.5 STRL LX (GLOVE) ×1 IMPLANT
GLOVE SS BIOGEL STRL SZ 8.5 (GLOVE) ×2 IMPLANT
GOWN STRL REUS W/ TWL LRG LVL3 (GOWN DISPOSABLE) ×2 IMPLANT
GOWN STRL REUS W/TWL 2XL LVL3 (GOWN DISPOSABLE) ×2 IMPLANT
GUIDEWIRE NITINOL BEVEL TIP (WIRE) IMPLANT
KIT BASIN OR (CUSTOM PROCEDURE TRAY) ×1 IMPLANT
KIT DILATOR XLIF 5 (KITS) IMPLANT
KIT POSITIONER JACKSON TABLE (MISCELLANEOUS) ×1 IMPLANT
KIT SURGICAL ACCESS MAXCESS 4 (KITS) IMPLANT
KIT TURNOVER KIT B (KITS) ×1 IMPLANT
MARKER PEN SURG W/LABELS BLK (STERILIZATION PRODUCTS) ×1 IMPLANT
MODULE EMG NDL SSEP NVM5 (NEUROSURGERY SUPPLIES) IMPLANT
MODULE EMG NEEDLE SSEP NVM5 (NEUROSURGERY SUPPLIES) IMPLANT
NDL HYPO 22X1.5 SAFETY MO (MISCELLANEOUS) ×1 IMPLANT
NDL I-PASS III (NEEDLE) IMPLANT
NEEDLE HYPO 22X1.5 SAFETY MO (MISCELLANEOUS) ×1 IMPLANT
NEEDLE I-PASS III (NEEDLE) IMPLANT
NS IRRIG 1000ML POUR BTL (IV SOLUTION) ×1 IMPLANT
PACK LAMINECTOMY ORTHO (CUSTOM PROCEDURE TRAY) ×1 IMPLANT
PACK UNIVERSAL I (CUSTOM PROCEDURE TRAY) ×2 IMPLANT
PAD ARMBOARD POSITIONER FOAM (MISCELLANEOUS) ×2 IMPLANT
PATTIES SURGICAL .5 X.5 (GAUZE/BANDAGES/DRESSINGS) IMPLANT
PATTIES SURGICAL .5 X1 (DISPOSABLE) IMPLANT
PUTTY DBM INSTAFILL CART 5CC (Putty) IMPLANT
ROD RELINE MAS LORD 5.5X45MM (Rod) IMPLANT
ROD RELINE MAS TI LORD 5.5X40 (Rod) IMPLANT
SCREW LOCK RELINE 5.5 TULIP (Screw) IMPLANT
SCREW RELINE RED 6.5X45MM POLY (Screw) IMPLANT
SPACER RISEL 22X55 7-14MM (Spacer) IMPLANT
SPONGE INTESTINAL PEANUT (DISPOSABLE) ×2 IMPLANT
SPONGE SURGIFOAM ABS GEL 100 (HEMOSTASIS) ×1 IMPLANT
STAPLER SKIN PROX 35W (STAPLE) IMPLANT
STRIP CLOSURE SKIN 1/2X4 (GAUZE/BANDAGES/DRESSINGS) ×2 IMPLANT
SURGIFLO W/THROMBIN 8M KIT (HEMOSTASIS) IMPLANT
SUT MNCRL AB 3-0 PS2 27 (SUTURE) ×2 IMPLANT
SUT MNCRL+ AB 3-0 CT1 36 (SUTURE) IMPLANT
SUT PDS AB 1 CTX 36 (SUTURE) ×2 IMPLANT
SUT SILK 2 0 TIES 10X30 (SUTURE) ×1 IMPLANT
SUT SILK 3 0 TIES 10X30 (SUTURE) ×1 IMPLANT
SUT VIC AB 1 CT1 18XCR BRD 8 (SUTURE) IMPLANT
SUT VIC AB 1 CTX36XBRD ANBCTRL (SUTURE) IMPLANT
SUT VIC AB 2-0 CT1 18 (SUTURE) ×2 IMPLANT
SYR BULB IRRIG 60ML STRL (SYRINGE) ×1 IMPLANT
SYR CONTROL 10ML LL (SYRINGE) ×1 IMPLANT
TIP CONICAL INSTAFILL (ORTHOPEDIC DISPOSABLE SUPPLIES) IMPLANT
TOWEL GREEN STERILE (TOWEL DISPOSABLE) ×2 IMPLANT
TOWEL GREEN STERILE FF (TOWEL DISPOSABLE) ×1 IMPLANT
TRAY FOLEY W/BAG SLVR 16FR ST (SET/KITS/TRAYS/PACK) ×1 IMPLANT
WATER STERILE IRR 1000ML POUR (IV SOLUTION) ×1 IMPLANT
YANKAUER SUCT BULB TIP NO VENT (SUCTIONS) ×1 IMPLANT

## 2023-10-01 NOTE — Anesthesia Preprocedure Evaluation (Addendum)
 Anesthesia Evaluation  Patient identified by MRN, date of birth, ID band Patient awake    Reviewed: Allergy & Precautions, NPO status , Patient's Chart, lab work & pertinent test results, reviewed documented beta blocker date and time   History of Anesthesia Complications Negative for: history of anesthetic complications  Airway Mallampati: III  TM Distance: >3 FB Neck ROM: Full    Dental  (+) Dental Advisory Given, Chipped   Pulmonary neg pulmonary ROS   Pulmonary exam normal        Cardiovascular hypertension, Pt. on home beta blockers and Pt. on medications + Peripheral Vascular Disease  Normal cardiovascular exam   Thoracic aortic aneurysm  '18 TTE - Moderate focal basal hypertrophy of the septum. EF 55% to 60%. Grade 1 diastolic dysfunction. Mild AI. Trivial MR.    Neuro/Psych  PSYCHIATRIC DISORDERS Anxiety Depression     OCD negative neurological ROS     GI/Hepatic negative GI ROS,,,(+)     substance abuse  marijuana use  Endo/Other  Hypothyroidism    Renal/GU negative Renal ROS     Musculoskeletal negative musculoskeletal ROS (+)    Abdominal   Peds  Hematology  Diffuse large B cell lymphoma    Anesthesia Other Findings CT Angio Chest - 1. Stable fusiform dilation of the ascending aorta measuring 4.4 cm in diameter. Recommend annual imaging follow up by CTA or MRA.  2. Extensive multi-vessel coronary artery calcification. 3. 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 If there is evidence of vertebrobasilar insufficiency, this could be better assessed with dedicated CT arteriography of the neck. 4. Stable chronic lingular collapse.   Reproductive/Obstetrics                              Anesthesia Physical Anesthesia Plan  ASA: 3  Anesthesia Plan: General   Post-op Pain Management: Tylenol  PO (pre-op)*    Induction: Intravenous  PONV Risk Score and Plan: 2 and Treatment may vary due to age or medical condition, Ondansetron  and Dexamethasone   Airway Management Planned: Oral ETT  Additional Equipment: None  Intra-op Plan:   Post-operative Plan: Extubation in OR  Informed Consent: I have reviewed the patients History and Physical, chart, labs and discussed the procedure including the risks, benefits and alternatives for the proposed anesthesia with the patient or authorized representative who has indicated his/her understanding and acceptance.     Dental advisory given  Plan Discussed with: CRNA and Anesthesiologist  Anesthesia Plan Comments:          Anesthesia Quick Evaluation

## 2023-10-01 NOTE — Discharge Instructions (Signed)

## 2023-10-01 NOTE — Op Note (Signed)
 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  7 x 22 x 55 with 0 degree lordosis.  Cage expanded to a final height of 12.75 mm. [employer] MIS pedicle screws.  6.5 x 45 mm length.  45 mm locking rod with on the right side, and 40 mm length rod on the left  Neuromonitoring: No adverse free running EMG or SSEP activity during the case.  All 4 pedicle screws were directly stimulated there was no adverse activity greater than 40 mA.  Graft: Ossifuse and DBX  BRIEF HISTORY: Douglas Bryant is a 73 y.o. male who has had progressive low back and neuropathic leg pain for some time now.  Despite appropriate conservative management his quality of life has continued to deteriorate.  As result we elected to move forward with surgery.  All appropriate risks, benefits, alternatives were discussed with the patient and consent was obtained.  PROCEDURE DETAILS: Patient was brought into the operating room and was properly positioned on the operating room table.  After induction with general anesthesia the patient was endotracheally intubated.  A timeout was taken to confirm all important data: including patient, procedure, and the level. Teds, SCD's were applied.   Foley was placed by the nurse and the needles for neuromonitoring were placed by the neuromonitoring rep.  The patient was turned into the lateral decubitus position left side up.  Axillary roll was placed and all bony prominences were well-padded.  Once the patient was properly positioned on  the table we imaged the spine.  In the AP view I confirmed that the disc space was properly aligned and the spinous process was midline.  Once I confirmed this I secured the patient directly to the table with tape to prevent motion during the surgery.  With patient secured I then adjusted the bed so that we had appropriate alignment of the L3-4 disc space in both planes.  The anterior and posterior margins of the L3-4 disc are marked and I mapped out my incision.  The patient was draped in the sterile fashion and I infiltrated the incision site with quarter percent Marcaine  with epinephrine .  A lateral incision was made and I sharply dissected down to the fascia of the external oblique.  I then bluntly dissected through the external oblique and into the retroperitoneal space.  I mobilized the tissue until I could palpate the surface of the psoas muscle.  The first dilator was then advanced down to the surface of the psoas and I stimulated to confirm I was not directly irritating or compressing the lumbar plexus.  I then advanced through the psoas down to the lateral aspect of the disc.  I secured this first dilator with the patient to prevent it from moving.  I then circumferentially stimulated the psoas muscle confirming no abnormal signals.  I then sequentially dilated and stimulated until the final dilator was positioned.  I then placed the working retractor over this and secured it to the bed.  I then removed the dilators and I could see the lateral surface of the disc.  I then mobilized the  retractor posteriorly until it was in the posterior third of the disc space.  I then stimulated behind the blade and inferior and superior as well as anteriorly to confirm that the plexus was not being traumatized.  The posterior blade was then secured to the disc space with the shim.  I then properly positioned it so I could directly visualize the disc space and secured it back to the bed.  I now had excellent  visualization of the lateral surface of the L3-4 disc space.  Annulotomy was performed with a 10 blade scalpel and then I placed a Cobb elevator along the endplate of L4 and advanced across the endplate and across the contralateral annulus.  I then did this running along the L3 inferior endplate.  I then took a box osteotome which allowed me to remove the bulk of the disc material.  I then placed sequential dilators up to a 10 mm lordotic implant.  I then used my curettes and Kerrison rongeurs to remove the remaining cartilaginous material.  At this point had excellent discectomy.  I then placed in the expandable cage and was able to expand up to 12-1/2 mm.  I confirmed in both planes satisfactory positioning of the trialing device.  At this point I irrigated the wound copiously normal saline and removed the trialing implant I then rasped the endplates to ensure I had bleeding subchondral bone.  The expandable implant was packed with the allograft bone and gently inserted across the disc space.  I then expanded it till final height of 12.75 mm.  This provided excellent restoration of the disc space height as well as improved foraminal space.  At this point I was pleased with the indirect posterior decompression as well as the restoration of disc height and positioning of the cage.  At this point I irrigated the wound copiously normal saline and bipolar cautery and Floseal to obtain and maintain hemostasis.  I then removed the retractor and took a final AP and lateral fluoroscopy view.  I confirmed there was no retained surgical instruments in the wound.  The fascia of the external bleak was then closed with a #1 runner followed by interrupted 2-0 Vicryl sutures and 3-0 Monocryl for the skin.  Steri-Strips dry dressings were applied.  The bed was then returned to a neutral position and we began placing our minimally invasive screws.  Identified the lateral border of the right and left L4 and L3 pedicles and  marked the skin.  The area was infiltrated with quarter percent Marcaine  with epinephrine .  Small incision was made and a Jamshidi needle was advanced percutaneously down to the lateral aspect of the pedicle.  Once I confirm satisfactory positioning in the AP view I advanced the Jamshidi needle into the pedicle.  I was directly stimulating the needle confirming there was no evidence of breach.  As I neared the medial wall of the pedicle on the AP view I switched to the lateral view to confirm that I was just beyond the posterior wall of the vertebral body.  Once I confirm satisfactory trajectory I advanced into the vertebral body.  The guidepin was then advanced down the central cannula Jamshidi needle to cannulate the pedicle.  Using this exact same technique I cannulated the remaining 3 pedicles.  Once all 4 pedicles were cannulated I then measured and obtained the 6.5 x 45 mm length screw and advanced it over the guidepin.  All 4 pedicle screws had excellent purchase.  The guidepins were  removed and I directly stimulated and was no adverse activity greater than 40 mA.  Imaging confirmed satisfactory positioning of the screws as well.  I then measured and obtained the appropriate size rod and secured it into the construct.  The locking caps were then inserted.  All 4 locking caps were tightened/torqued according manufacture standards.  The insertion device for the rod was removed.  I then broke off the insertion tabs and took my final images.  I had satisfactory positioning of the intervertebral cage and the posterior pedicle screw construct.  The posterior wounds were irrigated copiously normal saline and hemostasis was obtained using bipolar electrocautery.  The deep fascia of each of these 2 wounds were closed with interrupted #1 Vicryl suture followed by interrupted 2-0 Vicryl suture.  3-0 Monocryl was used for the skin.  Steri-Strips and a dry dressing were applied and the patient was ultimately extubated  and transferred to the PACU without incident.  All needle and sponge counts were correct.    Donaciano Sprang, MD 10/01/2023 2:30 PM

## 2023-10-01 NOTE — Brief Op Note (Signed)
 10/01/2023  2:46 PM  PATIENT:  Dempsey Hof III  73 y.o. 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  PROCEDURE:  Procedure(s) with comments: EXTREME LATERAL INTERBODY FUSION OF LUMBAR THREE TO LUMBAR FOUR WITH POSTERIOR SUPPLEMENTAL FUSION INSTRAMENTED OF LUMBAR THREE TO LUMBAR FOUR (N/A) - Extreme lateral interbody fusion L3-4,Posterior spinal fusion instrumentation L3-4  SURGEON:  Surgeons and Role:    DEWAINE Burnetta Aures, MD - Primary  PHYSICIAN ASSISTANT: Jeoffrey Sages  ASSISTANTS:    ANESTHESIA:   general  EBL:  200 mL   BLOOD ADMINISTERED:none  DRAINS: none   LOCAL MEDICATIONS USED:  MARCAINE      SPECIMEN:  No Specimen  DISPOSITION OF SPECIMEN:  N/A  COUNTS:  YES  TOURNIQUET:  * No tourniquets in log *  DICTATION: .Dragon Dictation  PLAN OF CARE: Admit to inpatient   PATIENT DISPOSITION:  PACU - hemodynamically stable.

## 2023-10-01 NOTE — H&P (Signed)
 History: Douglas Bryant is a very pleasant 73 year old gentleman with significant back buttock and neuropathic leg pain.  Imaging demonstrates degenerative lumbar disc disease with degenerative spondylolisthesis causing foraminal stenosis.  Due to the failure of conservative management we have elected to move forward with a XLIF at L3-4 with supplemental posterior pedicle screw fixation.  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   Environmental allergies    History of blood transfusion    History of bronchitis    History of chemotherapy    Hypertension    Hypothyroidism    Nocturia    Peripheral vascular disease (HCC)    Urinary frequency     Allergies  Allergen Reactions   Flomax [Tamsulosin Hcl] Swelling    congestion     No current facility-administered medications on file prior to encounter.   Current Outpatient Medications on File Prior to Encounter  Medication Sig Dispense Refill   acetaminophen  (TYLENOL ) 500 MG tablet Take 1,000 mg by mouth every 6 (six) hours as needed for mild pain.     aspirin 325 MG tablet Take 325 mg by mouth every 4 (four) hours as needed for moderate pain (pain score 4-6).     bismuth subsalicylate (PEPTO BISMOL) 262 MG/15ML suspension Take 30 mLs by mouth every 6 (six) hours as needed for indigestion or diarrhea or loose stools.     Ca Carbonate-Mag Hydroxide (ROLAIDS PO) Take 2 tablets by mouth daily as needed (heartburn).     Carboxymethylcellul-Glycerin (LUBRICATING EYE DROPS OP) Place 1 drop into both eyes daily as needed (dry eyes).     chlorpheniramine (CHLOR-TRIMETON) 4 MG tablet Take 4 mg by mouth every 4 (four) hours as needed for allergies.     clonazePAM  (KLONOPIN ) 0.5 MG tablet Take 1/2 tablet (0.25 mg total dose) twice daily. May have 1 additional dose every day PRN for severe anxiety. (Patient  taking differently: Take 0.5 mg by mouth 2 (two) times daily.) 45 tablet 2   diphenhydrAMINE (BENADRYL) 25 mg capsule Take 25 mg by mouth daily.     famotidine (PEPCID) 20 MG tablet Take 20 mg by mouth 2 (two) times daily as needed for heartburn or indigestion.     fluticasone (FLONASE) 50 MCG/ACT nasal spray Place 1 spray into both nostrils daily as needed for allergies or rhinitis.     hydrochlorothiazide  (HYDRODIURIL ) 25 MG tablet Take 1 tablet (25 mg total) by mouth daily. 30 tablet 3   hydrocortisone cream 1 % Apply 1 application topically daily as needed for itching.     ibuprofen (ADVIL) 200 MG tablet Take 600-800 mg by mouth every 6 (six) hours as needed for headache or moderate pain.     levothyroxine  (SYNTHROID ) 50 MCG tablet Take 50 mcg by mouth daily.     metoprolol  succinate (TOPROL  XL) 25 MG 24 hr tablet Take 1 tablet (25 mg total) by mouth daily. Take with or immediately following a meal. 30 tablet 11   oxymetazoline (AFRIN) 0.05 % nasal spray Place 1-2 sprays into both nostrils 2 (two) times daily as needed for congestion.     sertraline  (ZOLOFT ) 50 MG tablet Take 1 tablet (50 mg total) by mouth daily. (Patient taking differently: Take 50 mg by mouth daily as needed (Anxiety).) 30 tablet 5   testosterone  cypionate (  DEPOTESTOSTERONE CYPIONATE) 200 MG/ML injection Inject 200 mg into the muscle every 14 (fourteen) days.     valACYclovir (VALTREX) 500 MG tablet Take 500 mg by mouth daily as needed (Herpes Virus).     zolpidem  (AMBIEN ) 10 MG tablet Take 1 tablet (10 mg total) by mouth at bedtime. 30 tablet 5   albuterol (VENTOLIN HFA) 108 (90 Base) MCG/ACT inhaler Inhale 1-2 puffs into the lungs every 4 (four) hours as needed for wheezing or shortness of breath. (Patient not taking: Reported on 09/24/2023)     ciprofloxacin (CIPRO) 500 MG tablet Take 500 mg by mouth 2 (two) times daily. (Patient not taking: Reported on 09/24/2023)     predniSONE (DELTASONE) 10 MG tablet Take 10 mg by mouth 2  (two) times daily. (Patient not taking: Reported on 09/24/2023)      Physical Exam: Vitals:   10/01/23 1037  BP: (!) 147/73  Pulse: 61  Resp: 17  Temp: 98.2 F (36.8 C)  SpO2: 96%   Body mass index is 25.09 kg/m. Clinical exam: Douglas Bryant is a pleasant individual, who appears younger than their stated age.  He is alert and orientated 3.  No shortness of breath, chest pain.  Abdomen is soft and non-tender, negative loss of bowel and bladder control, no rebound tenderness.  Negative: skin lesions abrasions contusions  Peripheral pulses: 2+ peripheral pulses bilaterally. LE compartments are: Soft and nontender.  Gait pattern: Altered gait pattern due to progressive right anterior thigh pain and dysesthesias primarily L3 dermatome  Assistive devices: Cane  Neuro: 5/5 motor strength in the lower extremity bilaterally. Positive dysesthesias and decreased sensation light touch in the right L3 dermatome. Positive femoral stretch test with reproduction of right anterior thigh pain. Negative Babinski test, no clonus, 1+ deep tendon reflexes symmetrically.  Musculoskeletal: Progressive low back pain radiating into the paraspinal region. Decreased range of motion due to severe pain. No SI joint pain.  Imaging: X-rays of the lumbar spine including AP lateral flexion-extension views were compared to those from 2021. X-rays today demonstrate progression in the collapse of the disc at L3-4. No change in the grade 1 spondylolisthesis at L3-4. There is progression in the foraminal stenosis at L3-4. Patient now has a 12 degree apex to the right scoliosis which was not present in 2021.  Lumbar MRI: completed on 01/23/2023. Severe central canal stenosis at L3-4 with severe right foraminal stenosis and significant facet arthropathy. Grade 1 anterior listhesis. Mild disc bulge at L4-5 with moderate central stenosis. Mild degenerative changes at L5-S1. Mild degenerative changes L1-3. No fracture or abnormal  marrow signal change noted.  Lumbar MRI: completed on 03/13/2023 was reviewed with the patient. It was completed at [facility]; I have independently reviewed the images as well as the radiology report. Ongoing significant stenosis at L3-4 which is not significantly changed from his 01/23/2023 MRI. Grade 1 slip persists. Mild degenerative changes L1-3. Mild disc bulge with moderate stenosis L4-5. No fracture or abnormal marrow signal change.  Image: CT ANGIO CHEST AORTA W/CM & OR WO/CM Result Date: 09/01/2023 CLINICAL DATA:  Thoracic aortic aneurysm EXAM: CT ANGIOGRAPHY CHEST WITH CONTRAST TECHNIQUE: Multidetector CT imaging of the chest was performed using the standard protocol during bolus administration of intravenous contrast. Multiplanar CT image reconstructions and MIPs were obtained to evaluate the vascular anatomy. RADIATION DOSE REDUCTION: This exam was performed according to the departmental dose-optimization program which includes automated exposure control, adjustment of the mA and/or kV according to patient size and/or use of iterative reconstruction  technique. CONTRAST:  75mL OMNIPAQUE  IOHEXOL  350 MG/ML SOLN COMPARISON:  09/15/2022 FINDINGS: Cardiovascular: There is fusiform dilation of the ascending aorta measuring 4.4 cm in diameter, stable since prior examination. No superimposed intramural hematoma or dissection. Proximal descending thoracic aorta is stable measuring 3.0 cm in diameter. Distal descending thoracic aorta is of normal caliber. Arch vasculature demonstrates classic anatomic configuration is widely patent proximally. Incidentally noted are hemodynamically significant stenoses of the codominant vertebral arteries bilaterally at their origins, not optimally assessed on this examination. Extensive multi-vessel coronary artery calcification. Global cardiac size is within normal limits. No pericardial effusion. Central pulmonary arteries are of normal caliber. Mediastinum/Nodes: No  enlarged mediastinal, hilar, or axillary lymph nodes. Thyroid  gland, trachea, and esophagus demonstrate no significant findings. Lungs/Pleura: Stable chronic lingular collapse. Lungs are otherwise clear. No pneumothorax or pleural effusion. No central obstructing lesion. Upper Abdomen: No acute abnormality. Musculoskeletal: No chest wall abnormality. No acute or significant osseous findings. Review of the MIP images confirms the above findings. IMPRESSION: 1. Stable fusiform dilation of the ascending aorta measuring 4.4 cm in diameter. Recommend annual imaging followup by CTA or MRA. This recommendation follows 2010 ACCF/AHA/AATS/ACR/ASA/SCA/SCAI/SIR/STS/SVM Guidelines for the Diagnosis and Management of Patients with Thoracic Aortic Disease. Circulation. 2010; 121: Z733-z630. Aortic aneurysm NOS (ICD10-I71.9) 2. Extensive multi-vessel coronary artery calcification. 3. 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 If there is evidence of vertebrobasilar insufficiency, this could be better assessed with dedicated CT arteriography of the neck. 4. Stable chronic lingular collapse. Electronically Signed   By: Dorethia Molt M.D.   On: 09/01/2023 14:33    A/P:  Douglas Bryant is a very pleasant 73 year old gentleman who has persistent back buttock and neuropathic leg pain. His clinical exam and MRI continue to demonstrate L3-4 degenerative disease with anterior listhesis that is mobile on flexion-extension views. In addition, he also has a degenerative scoliosis. At this point he is elected to move forward with surgery. I think this is the best option for him in order to remove pressure off of the L3 and improve his sagittal alignment. Both of these should hopefully improve his lower extremity strength.  Surgical plan is aided XLIF at L3-4 with supplemental posterior pedicle screw fixation. We utilized the [facility] MIS pedicle screws and the globus expandable  intervertebral cage. Source: DBX mix. We will also utilize neuromonitoring to decrease the potential risk of injury to the lumbar plexus and to ensure that the screws are not causing nerve irritation.  OLIF/XLIF risks, benefits of surgery were reviewed with the patient. These include: infection, bleeding, death, stroke, paralysis, ongoing or worse pain, need for additional surgery, injury to the lumbar plexus resulting in hip flexor weakness and difficulty walking without assistive devices. Adjacent segment degenerative disease, need for additional surgery including fusing other levels, leak of spinal fluid, Nonunion, hardware failure, breakage, or mal-position. Deep venous thrombosis (DVT) requiring additional treatment such as filter, and/or medications. Injury to abdominal contents, loss in bowel and bladder control.  Risks and benefits of posterior spinal fusion: Infection, bleeding, death, stroke, paralysis, ongoing or worse pain, need for additional surgery, nonunion, leak of spinal fluid, adjacent segment degeneration requiring additional fusion surgery, Injury to abdominal vessels that can require anterior surgery to stop bleeding. Malposition of the cage and/or pedicle screws that could require additional surgery. Loss of bowel and bladder control. Postoperative hematoma causing neurologic compression that could require urgent or emergent re-operation.

## 2023-10-01 NOTE — Transfer of Care (Signed)
 Immediate Anesthesia Transfer of Care Note  Patient: Douglas Bryant  Procedure(s) Performed: EXTREME LATERAL INTERBODY FUSION OF LUMBAR THREE TO LUMBAR FOUR WITH POSTERIOR SUPPLEMENTAL FUSION INSTRAMENTED OF LUMBAR THREE TO LUMBAR FOUR (Spine Lumbar)  Patient Location: PACU  Anesthesia Type:General  Level of Consciousness: drowsy and patient cooperative  Airway & Oxygen Therapy: Patient Spontanous Breathing  Post-op Assessment: Post -op Vital signs reviewed and stable  Post vital signs: Reviewed and stable  Last Vitals:  Vitals Value Taken Time  BP 137/92 10/01/23 15:15  Temp    Pulse 66 10/01/23 15:20  Resp 14 10/01/23 15:20  SpO2 98 % 10/01/23 15:20  Vitals shown include unfiled device data.  Last Pain:  Vitals:   10/01/23 1100  TempSrc:   PainSc: 0-No pain         Complications: No notable events documented.

## 2023-10-02 ENCOUNTER — Encounter (HOSPITAL_COMMUNITY): Payer: Self-pay | Admitting: Orthopedic Surgery

## 2023-10-02 MED ORDER — CALCIUM CARBONATE ANTACID 500 MG PO CHEW
400.0000 mg | CHEWABLE_TABLET | Freq: Three times a day (TID) | ORAL | Status: DC
  Administered 2023-10-02 – 2023-10-03 (×2): 400 mg via ORAL
  Filled 2023-10-02 (×2): qty 2

## 2023-10-02 MED ORDER — BISACODYL 5 MG PO TBEC
5.0000 mg | DELAYED_RELEASE_TABLET | Freq: Every day | ORAL | Status: AC | PRN
  Administered 2023-10-02: 10 mg via ORAL
  Filled 2023-10-02: qty 2

## 2023-10-02 MED ORDER — ALUM & MAG HYDROXIDE-SIMETH 200-200-20 MG/5ML PO SUSP
30.0000 mL | Freq: Four times a day (QID) | ORAL | Status: DC | PRN
  Administered 2023-10-02: 30 mL via ORAL
  Filled 2023-10-02: qty 30

## 2023-10-02 MED ORDER — SENNOSIDES-DOCUSATE SODIUM 8.6-50 MG PO TABS
1.0000 | ORAL_TABLET | Freq: Two times a day (BID) | ORAL | Status: DC
  Administered 2023-10-02 – 2023-10-03 (×2): 1 via ORAL
  Filled 2023-10-02 (×2): qty 1

## 2023-10-02 MED FILL — Thrombin For Soln 20000 Unit: CUTANEOUS | Qty: 1 | Status: AC

## 2023-10-02 NOTE — Discharge Summary (Signed)
 [name] MRN: [id number] DOB/AGE: 07-12-1950 73 y.o.  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 male who was admitted 10/01/2023 for operative treatment of S/P lumbar fusion. Patient failed conservative treatments (please see the history and physical for the specifics) and had severe unremitting pain that affects sleep, daily activities and work/hobbies. After pre-op clearance, the patient was taken to the operating room on 10/01/2023 and underwent  Procedure(s): EXTREME LATERAL INTERBODY FUSION OF LUMBAR THREE TO LUMBAR FOUR WITH POSTERIOR SUPPLEMENTAL FUSION INSTRAMENTED OF LUMBAR THREE TO LUMBAR FOUR.    Patient was given perioperative antibiotics:  Anti-infectives (From admission, onward)    Start     Dose/Rate Route Frequency Ordered Stop   10/01/23 1845  ceFAZolin  (ANCEF ) IVPB 1 g/50 mL premix        1 g 100 mL/hr over 30 Minutes Intravenous Every  8 hours 10/01/23 1759 10/02/23 0759   10/01/23 1115  ceFAZolin  (ANCEF ) IVPB 2g/100 mL premix        2 g 200 mL/hr over 30 Minutes Intravenous 30 min pre-op 10/01/23 1008 10/01/23 1212   10/01/23 1017  ceFAZolin  (ANCEF ) 2-4 GM/100ML-% IVPB       Note to Pharmacy: Barron Friday D: cabinet override      10/01/23 1017 10/01/23 1237        Patient was given sequential compression devices and early ambulation to prevent DVT.   Patient benefited maximally from hospital stay and there were no complications. At the time of discharge, the patient was urinating/moving their bowels without difficulty, tolerating a regular diet, pain is controlled with oral pain medications and they have been cleared by PT/OT.   Recent vital signs: Patient Vitals for the past 24 hrs:  BP Temp Temp src Pulse Resp SpO2  10/03/23 0711 107/66 98.3 F (36.8 C) Oral 78 19 97 %  10/03/23 0456 116/65 98.9 F (37.2 C) Oral 73 18 97 %  10/02/23 2227 136/80 98.2 F (36.8 C) Oral 79 18 97 %  10/02/23 1921 106/64 98.4 F (36.9 C) Oral 71 18 98 %  10/02/23 1554 124/75 97.6 F (36.4 C) Oral 71 19 99 %  10/02/23 1251 (!) 133/90 98.3 F (36.8 C) Oral 79 20 100 %     Recent laboratory studies: No results for input(s): WBC, HGB, HCT, PLT, NA, K, CL, CO2, BUN, CREATININE, GLUCOSE, INR, CALCIUM  in  the last 72 hours.  Invalid input(s): PT, 2   Discharge Medications:   Allergies as of 10/03/2023       Reactions   Flomax [tamsulosin Hcl] Swelling   congestion         Medication List     STOP taking these medications    aspirin 325 MG tablet   ciprofloxacin 500 MG tablet Commonly known as: CIPRO   hydrocortisone cream 1 %   ibuprofen 200 MG tablet Commonly known as: ADVIL   predniSONE 10 MG tablet Commonly known as: DELTASONE   testosterone  cypionate 200 MG/ML injection Commonly known as: DEPOTESTOSTERONE CYPIONATE       TAKE these medications    acetaminophen  500 MG  tablet Commonly known as: TYLENOL  Take 1,000 mg by mouth every 6 (six) hours as needed for mild pain.   albuterol 108 (90 Base) MCG/ACT inhaler Commonly known as: VENTOLIN HFA Inhale 1-2 puffs into the lungs every 4 (four) hours as needed for wheezing or shortness of breath.   bismuth subsalicylate 262 MG/15ML suspension Commonly known as: PEPTO BISMOL Take 30 mLs by mouth every 6 (six) hours as needed for indigestion or diarrhea or loose stools.   chlorpheniramine 4 MG tablet Commonly known as: CHLOR-TRIMETON Take 4 mg by mouth every 4 (four) hours as needed for allergies.   clonazePAM  0.5 MG tablet Commonly known as: KlonoPIN  Take 1/2 tablet (0.25 mg total dose) twice daily. May have 1 additional dose every day PRN for severe anxiety. What changed:  how much to take how to take this when to take this additional instructions   diphenhydrAMINE 25 mg capsule Commonly known as: BENADRYL Take 25 mg by mouth daily.   famotidine 20 MG tablet Commonly known as: PEPCID Take 20 mg by mouth 2 (two) times daily as needed for heartburn or indigestion.   fluticasone 50 MCG/ACT nasal spray Commonly known as: FLONASE Place 1 spray into both nostrils daily as needed for allergies or rhinitis.   hydrochlorothiazide  25 MG tablet Commonly known as: HYDRODIURIL  Take 1 tablet (25 mg total) by mouth daily.   levothyroxine  50 MCG tablet Commonly known as: SYNTHROID  Take 50 mcg by mouth daily.   LUBRICATING EYE DROPS OP Place 1 drop into both eyes daily as needed (dry eyes).   methocarbamol  500 MG tablet Commonly known as: ROBAXIN  Take 1 tablet (500 mg total) by mouth every 8 (eight) hours as needed for up to 5 days for muscle spasms.   metoprolol  succinate 25 MG 24 hr tablet Commonly known as: Toprol  XL Take 1 tablet (25 mg total) by mouth daily. Take with or immediately following a meal.   ondansetron  4 MG tablet Commonly known as: Zofran  Take 1 tablet (4 mg total) by mouth every  8 (eight) hours as needed for nausea or vomiting.   oxyCODONE -acetaminophen  10-325 MG tablet Commonly known as: Percocet Take 1 tablet by mouth every 6 (six) hours as needed for up to 5 days for pain.   oxymetazoline  0.05 % nasal spray Commonly known as: AFRIN Place 1-2 sprays into both nostrils 2 (two) times daily as needed for congestion.   ROLAIDS PO Take 2 tablets by mouth daily as needed (heartburn).   senna-docusate 8.6-50 MG tablet Commonly known as: Senokot-S Take 1 tablet by mouth 2 (two) times daily.   sertraline  50 MG tablet Commonly known as: ZOLOFT  Take 1 tablet (50 mg total) by mouth daily. What changed:  when to take this reasons to take this   valACYclovir  500 MG tablet Commonly known as: VALTREX Take 500 mg by mouth daily as needed (Herpes Virus).   zolpidem  10 MG tablet Commonly known as: AMBIEN  Take 1 tablet (10 mg total) by mouth at bedtime.        Diagnostic Studies: DG Lumbar Spine 2-3 Views Result Date: 10/01/2023 CLINICAL DATA:  Elective surgery. EXAM: LUMBAR SPINE - 2-3 VIEW COMPARISON:  09/12/2019 FINDINGS: Ten fluoroscopic spot views of the lumbar spine submitted from the operating room. Posterior rod with pedicle screw fixation and interbody spacer at L3-L4. Fluoroscopy time 273.4 seconds. Dose 184.52 mGy. IMPRESSION: Intraoperative fluoroscopy during lumbar fusion. Electronically Signed   By: Andrea Gasman M.D.   On: 10/01/2023 16:00   DG C-Arm 1-60 Min-No Report Result Date: 10/01/2023 Fluoroscopy was utilized by the requesting physician.  No radiographic interpretation.   DG C-Arm 1-60 Min-No Report Result Date: 10/01/2023 Fluoroscopy was utilized by the requesting physician.  No radiographic interpretation.   DG C-Arm 1-60 Min-No Report Result Date: 10/01/2023 Fluoroscopy was utilized by the requesting physician.  No radiographic interpretation.    Discharge Instructions     Call MD / Call 911   Complete by: As directed    If you  experience chest pain or shortness of breath, CALL 911 and be transported to the hospital emergency room.  If you develope a fever above 101 F, pus (white drainage) or increased drainage or redness at the wound, or calf pain, call your surgeon's office.   Constipation Prevention   Complete by: As directed    Drink plenty of fluids.  Prune juice may be helpful.  You may use a stool softener, such as Colace (over the counter) 100 mg twice a day.  Use MiraLax  (over the counter) for constipation as needed.   Diet - low sodium heart healthy   Complete by: As directed    Incentive spirometry RT   Complete by: As directed    Increase activity slowly as tolerated   Complete by: As directed    Post-operative opioid taper instructions:   Complete by: As directed    POST-OPERATIVE OPIOID TAPER INSTRUCTIONS: It is important to wean off of your opioid medication as soon as possible. If you do not need pain medication after your surgery it is ok to stop day one. Opioids include: Codeine, Hydrocodone(Norco, Vicodin), Oxycodone (Percocet, oxycontin ) and hydromorphone  amongst others.  Long term and even short term use of opiods can cause: Increased pain response Dependence Constipation Depression Respiratory depression And more.  Withdrawal symptoms can include Flu like symptoms Nausea, vomiting And more Techniques to manage these symptoms Hydrate well Eat regular healthy meals Stay active Use relaxation techniques(deep breathing, meditating, yoga) Do Not substitute Alcohol  to help with tapering If you have been on opioids for less than two weeks and do not have pain than it is ok to stop all together.  Plan to wean off of opioids This plan should start within one week post op of your joint replacement. Maintain the same interval or time between taking each dose and first decrease the dose.  Cut the total daily intake of opioids by one tablet each day Next start to increase the time between  doses. The last dose that should be eliminated is the evening dose.           Follow-up Information     Burnetta Aures, MD. Schedule an appointment as soon as possible for a visit in 2 week(s).   Specialty: Orthopedic Surgery Why: If symptoms worsen,  For suture removal, For wound re-check Contact information: [address] [place] [address] [phone]                 Discharge Plan:  discharge to home   Disposition:  Adrian is a very pleasant 73 year old male who is POD 2 from XLIF with PSFI of L3-L4. Surgical intervention was successful and without complications. His hospital course has been uncomplicated. He is ambulating on his own. He is tolerating oral intake well. He is compliant with the LSO brace. He is complaint with the incentive spirometer. He reports that his pain is well controled with oral pain medications. Dressing is c/d/I on the flank and on the back. Positive void, positive flatus, negative BM. Plan to d/c to home today.  Sorbitol  prior to d/c.  F/u as scheduled.  He understands the plan and agrees.   Signed: Norleen Armor for Dr. Donaciano Sprang [facility] [phone] 10/03/2023, 8:20 AM

## 2023-10-02 NOTE — Evaluation (Signed)
 Occupational Therapy Evaluation Patient Details Name: Torrez Renfroe III MRN: [id number] DOB: 02-Nov-1950 Today's Date: 10/02/2023   History of Present Illness   73 yo M s/p TLIF and PLIF.  PMH includes: Depression, HTN, Anemia, PVD, Anxiety     Clinical Impressions Patient admitted for the procedure above.  PTA he lives at home and cares for his younger sister, who unfortunately had a significant CVA and requires assist for ADL and transfers with a slide board.  He does have CNA care for her, but will need to assist with hygiene.  Currently he is sore, but is moving with supervision at a RW level.  Min A for lower body ADL, but should progress to baseline as pain resolves.  OT to see patient prior to discharge to review ADL and back precautions.  Patient should also attempt 3 stairs.  No post acute OT is anticipated.       If plan is discharge home, recommend the following:   Assist for transportation     Functional Status Assessment   Patient has had a recent decline in their functional status and demonstrates the ability to make significant improvements in function in a reasonable and predictable amount of time.     Equipment Recommendations   None recommended by OT     Recommendations for Other Services         Precautions/Restrictions   Precautions Precautions: Back Precaution Booklet Issued: Yes (comment) Precaution/Restrictions Comments: VC's as needed Required Braces or Orthoses: Spinal Brace Spinal Brace: Lumbar corset Restrictions Weight Bearing Restrictions Per Provider Order: No     Mobility Bed Mobility Overal bed mobility: Needs Assistance Bed Mobility: Sidelying to Sit, Sit to Sidelying   Sidelying to sit: Supervision     Sit to sidelying: Supervision      Transfers Overall transfer level: Needs assistance   Transfers: Sit to/from Stand, Bed to chair/wheelchair/BSC Sit to Stand: Contact guard assist     Step pivot transfers:  Supervision            Balance Overall balance assessment: Mild deficits observed, not formally tested                                         ADL either performed or assessed with clinical judgement   ADL                       Lower Body Dressing: Minimal assistance;Sit to/from stand   Toilet Transfer: Supervision/safety;Ambulation;Rolling walker (2 wheels)                   Vision Patient Visual Report: No change from baseline       Perception Perception: Not tested       Praxis Praxis: Not tested       Pertinent Vitals/Pain Pain Assessment Pain Assessment: Faces Faces Pain Scale: Hurts little more Pain Location: Incisional Pain Descriptors / Indicators: Grimacing Pain Intervention(s): Monitored during session     Extremity/Trunk Assessment Upper Extremity Assessment Upper Extremity Assessment: Overall WFL for tasks assessed   Lower Extremity Assessment Lower Extremity Assessment: Generalized weakness   Cervical / Trunk Assessment Cervical / Trunk Assessment: Back Surgery   Communication Communication Communication: No apparent difficulties   Cognition Arousal: Alert Behavior During Therapy: WFL for tasks assessed/performed Cognition: No apparent impairments  Following commands: Intact       Cueing  General Comments   Cueing Techniques: Verbal cues;Gestural cues   VSS   Exercises     Shoulder Instructions      Home Living Family/patient expects to be discharged to:: Private residence Living Arrangements: Other relatives Available Help at Discharge: Family;Available PRN/intermittently Type of Home: House Home Access: Stairs to enter [employer] of Steps: 3 Entrance Stairs-Rails: Right;Left;Can reach both Home Layout: Two level;Able to live on main level with bedroom/bathroom     Bathroom Shower/Tub: Chief Strategy Officer:  Standard Bathroom Accessibility: Yes How Accessible: Accessible via walker Home Equipment: Rolling Walker (2 wheels);Cane - single point;Shower seat;Grab bars - tub/shower;Adaptive equipment Adaptive Equipment: Reacher        Prior Functioning/Environment Prior Level of Function : Independent/Modified Independent;Driving                    OT Problem List: Pain   OT Treatment/Interventions: Self-care/ADL training;DME and/or AE instruction;Therapeutic activities      OT Goals(Current goals can be found in the care plan section)   Acute Rehab OT Goals Patient Stated Goal: Return home OT Goal Formulation: With patient Time For Goal Achievement: 10/16/23 Potential to Achieve Goals: Good ADL Goals Pt Will Perform Lower Body Dressing: with modified independence;with adaptive equipment;sit to/from stand Pt Will Transfer to Toilet: with modified independence;ambulating   OT Frequency:  Min 2X/week    Co-evaluation              AM-PAC OT 6 Clicks Daily Activity     Outcome Measure Help from another person eating meals?: None Help from another person taking care of personal grooming?: None Help from another person toileting, which includes using toliet, bedpan, or urinal?: A Little Help from another person bathing (including washing, rinsing, drying)?: A Little Help from another person to put on and taking off regular upper body clothing?: None Help from another person to put on and taking off regular lower body clothing?: A Little 6 Click Score: 21   End of Session Equipment Utilized During Treatment: Rolling walker (2 wheels) Nurse Communication: Mobility status  Activity Tolerance: Patient tolerated treatment well Patient left: in bed;with call bell/phone within reach  OT Visit Diagnosis: Unsteadiness on feet (R26.81)                Time: 1000-1025 OT Time Calculation (min): 25 min Charges:  OT General Charges $OT Visit: 1 Visit OT Evaluation $OT Eval  Moderate Complexity: 1 Mod OT Treatments $Self Care/Home Management : 8-22 mins  10/02/2023  RP, OTR/L  Acute Rehabilitation Services  Office:  [phone]   Charlie JONETTA Halsted 10/02/2023, 10:49 AM

## 2023-10-02 NOTE — Progress Notes (Signed)
    Subjective: Procedure(s) (LRB): EXTREME LATERAL INTERBODY FUSION OF LUMBAR THREE TO LUMBAR FOUR WITH POSTERIOR SUPPLEMENTAL FUSION INSTRAMENTED OF LUMBAR THREE TO LUMBAR FOUR (N/A) 1 Day Post-Op  Patient reports pain as 6 on 0-10 scale.  Reports unchanged leg pain as the patient did not have much leg pain prior to surgery  denies incisional back pain   Positive void Negative bowel movement Negative flatus Negative chest pain or shortness of breath  Objective: Vital signs in last 24 hours: Temp:  [97.4 F (36.3 C)-98.3 F (36.8 C)] 97.9 F (36.6 C) (09/05 0337) Pulse Rate:  [56-85] 85 (09/05 0337) Resp:  [14-27] 18 (09/05 0337) BP: (124-174)/(73-96) 129/91 (09/05 0337) SpO2:  [94 %-99 %] 97 % (09/05 0337) Weight:  [83.9 kg] 83.9 kg (09/04 1037)  Intake/Output from previous day: 09/04 0701 - 09/05 0700 In: 1980 [P.O.:480; I.V.:1400; IV Piggyback:100] Out: 400 [Urine:200; Blood:200]  Labs: No results for input(s): WBC, RBC, HCT, PLT in the last 72 hours. No results for input(s): NA, K, CL, CO2, BUN, CREATININE, GLUCOSE, CALCIUM  in the last 72 hours. No results for input(s): LABPT, INR in the last 72 hours.  Physical Exam: Neurologically intact Neurovascular intact Sensation intact distally Intact pulses distally Dorsiflexion/Plantar flexion intact Incision: dressing C/D/I No cellulitis present Compartment soft Body mass index is 25.09 kg/m.   Assessment/Plan: Douglas Bryant is a very pleasant 73 year old male who is POD 1  from XLIF with PSFI of L3-L4. Surgical intervention was successful and without complications. His hospital course has been uncomplicated. He is ambulating on his own. He is tolerating oral intake well. He is compliant with the LSO brace. He is complaint with the incentive spirometer. He reports that his pain is well controled with oral pain medications. Dressing is c/d/I on the flank and on the back. Positive void, negative  flatus, negative BM  Plan: -Continue care  -Continue medical management  -Continue incentive spirometer -Continue to mobilize with PT -Plan to d/c to home tomorrow pending positive flatus, PT clearance, and pain control on oral meds  Jakhi Dishman PA-C for Douglas Sprang, MD [facility] [phone]

## 2023-10-03 MED ORDER — SENNOSIDES-DOCUSATE SODIUM 8.6-50 MG PO TABS: 1.0000 | ORAL_TABLET | Freq: Two times a day (BID) | ORAL | 0 refills | Status: DC

## 2023-10-03 MED ORDER — OXYMETAZOLINE HCL 0.05 % NA SOLN: 1.0000 | Freq: Two times a day (BID) | NASAL | Status: DC

## 2023-10-03 MED ORDER — OXYMETAZOLINE HCL 0.05 % NA SOLN
1.0000 | Freq: Two times a day (BID) | NASAL | Status: DC | PRN
  Filled 2023-10-03: qty 30

## 2023-10-03 MED ORDER — SORBITOL 70 % SOLN
30.0000 mL | Freq: Once | Status: AC
  Administered 2023-10-03: 30 mL via ORAL
  Filled 2023-10-03: qty 30

## 2023-10-03 NOTE — Care Management (Signed)
 Patient with order to DC to home today. Unit staff to provide DME needed for home.   No HH needs identified Patient will have family/ friends provide transportation home. No other TOC needs identified for DC

## 2023-10-03 NOTE — Progress Notes (Signed)
 Patient alert and oriented, voided, ambulated. D/c instructions explain and given to the patient. Surgical site clean and dry no sign of infection, some bruise to L flank. Patient d/c home with his belongings and RW

## 2023-10-03 NOTE — Progress Notes (Signed)
 Occupational Therapy Treatment Patient Details Name: Douglas Bryant MRN: [id number] DOB: Jun 09, 1950 Today's Date: 10/03/2023   History of present illness 73 yo M s/p TLIF and PLIF.  PMH includes: Depression, HTN, Anemia, PVD, Anxiety   OT comments  Pt needed reinforcement of back precautions in the beginning of session, re-educated pt on back precautions and compensatory strategies for ADLs. Pt needing additional assist with LB ADLs, educated him on AE for LBD using hipkit, he had a tough time using the sockaid but his feet were sticky and likely affecting the friction of the device. Provided pt with list of DME recommendations to support his independence with ADLs. OT to continue following pt while acute to progress pt as able.      If plan is discharge home, recommend the following:  Assist for transportation;Assistance with cooking/housework   Equipment Recommendations  Other (comment) (RW)    Recommendations for Other Services      Precautions / Restrictions Precautions Precautions: Back Precaution Booklet Issued: Yes (comment) Precaution/Restrictions Comments: educated pt on back precautions, reinforced PRN Required Braces or Orthoses: Spinal Brace Spinal Brace: Lumbar corset Restrictions Weight Bearing Restrictions Per Provider Order: No       Mobility Bed Mobility               General bed mobility comments: pt OOB on arrival    Transfers Overall transfer level: Needs assistance Equipment used: Rolling walker (2 wheels) Transfers: Sit to/from Stand Sit to Stand: Contact guard assist           General transfer comment: Repeated STS attempts x3 sitting EOB, pt slow to rise.     Balance Overall balance assessment: Mild deficits observed, not formally tested                                         ADL either performed or assessed with clinical judgement   ADL       Grooming: Sitting;Supervision/safety;Oral care Grooming Details  (indicate cue type and reason): Educated pt on use of cups to complete oral care strategies, 1 vc for back precautions.     Lower Body Bathing: Set up;Sitting/lateral leans;With adaptive equipment Lower Body Bathing Details (indicate cue type and reason): Educated pt on the use of AE for LBB,     Lower Body Dressing: With adaptive equipment (Educated pt on the use of hip kit, to complete LBD.)               Functional mobility during ADLs: Contact guard assist (no AD) General ADL Comments: Demonstrated car transfer to pt.    Extremity/Trunk Assessment              Vision       Restaurant manager, fast food Communication: No apparent difficulties   Cognition Arousal: Alert Behavior During Therapy: WFL for tasks assessed/performed Cognition: No apparent impairments             OT - Cognition Comments: education needed on back precautions.                 Following commands: Intact        Cueing   Cueing Techniques: Verbal cues, Gestural cues  Exercises      Shoulder Instructions       General Comments Bruising noted at TLIF incision site, discussed this with RN  and she reports that it is not new.    Pertinent Vitals/ Pain       Pain Assessment Pain Assessment: Faces Faces Pain Scale: Hurts a little bit Pain Location: Incisional Pain Descriptors / Indicators: Grimacing Pain Intervention(s): Monitored during session  Home Living                                          Prior Functioning/Environment              Frequency  Min 2X/week        Progress Toward Goals  OT Goals(current goals can now be found in the care plan section)  Progress towards OT goals: Progressing toward goals  Acute Rehab OT Goals Patient Stated Goal: Return home OT Goal Formulation: With patient Time For Goal Achievement: 10/16/23 Potential to Achieve Goals: Good  Plan      Co-evaluation                  AM-PAC OT 6 Clicks Daily Activity     Outcome Measure   Help from another person eating meals?: None Help from another person taking care of personal grooming?: None Help from another person toileting, which includes using toliet, bedpan, or urinal?: A Little Help from another person bathing (including washing, rinsing, drying)?: A Little Help from another person to put on and taking off regular upper body clothing?: None Help from another person to put on and taking off regular lower body clothing?: A Little 6 Click Score: 21    End of Session Equipment Utilized During Treatment: Rolling walker (2 wheels)  OT Visit Diagnosis: Unsteadiness on feet (R26.81);Pain Pain - part of body:  (back)   Activity Tolerance Patient tolerated treatment well   Patient Left in bed;with call bell/phone within reach   Nurse Communication Mobility status        Time: 9151-9078 OT Time Calculation (min): 33 min  Charges: OT General Charges $OT Visit: 1 Visit OT Treatments $Self Care/Home Management : 23-37 mins  10/03/2023  AB, OTR/L  Acute Rehabilitation Services  Office: [phone]   Curtistine JONETTA Das 10/03/2023, 9:55 AM

## 2023-10-06 NOTE — Anesthesia Postprocedure Evaluation (Signed)
 Anesthesia Post Note  Patient: Douglas Bryant  Procedure(s) Performed: EXTREME LATERAL INTERBODY FUSION OF LUMBAR THREE TO LUMBAR FOUR WITH POSTERIOR SUPPLEMENTAL FUSION INSTRAMENTED OF LUMBAR THREE TO LUMBAR FOUR (Spine Lumbar)     Patient location during evaluation: PACU Anesthesia Type: General Level of consciousness: awake and alert Pain management: pain level controlled Vital Signs Assessment: post-procedure vital signs reviewed and stable Respiratory status: spontaneous breathing, nonlabored ventilation, respiratory function stable and patient connected to nasal cannula oxygen Cardiovascular status: blood pressure returned to baseline and stable Postop Assessment: no apparent nausea or vomiting Anesthetic complications: no   No notable events documented.  Last Vitals:  Vitals:   10/03/23 0456 10/03/23 0711  BP: 116/65 107/66  Pulse: 73 78  Resp: 18 19  Temp: 37.2 C 36.8 C  SpO2: 97% 97%    Last Pain:  Vitals:   10/03/23 1100  TempSrc:   PainSc: 4                  Jonna Dittrich L Tomi Paddock

## 2023-10-15 ENCOUNTER — Ambulatory Visit (HOSPITAL_COMMUNITY)
Admission: RE | Admit: 2023-10-15 | Discharge: 2023-10-15 | Disposition: A | Discharge status: Home / Self Care | Payer: Medicare (Managed Care) | Source: Ambulatory Visit | Attending: Vascular Surgery | Admitting: Vascular Surgery

## 2023-10-15 ENCOUNTER — Other Ambulatory Visit (HOSPITAL_COMMUNITY): Payer: Self-pay | Admitting: Orthopedic Surgery

## 2023-10-15 DIAGNOSIS — R52 Pain, unspecified: Secondary | ICD-10-CM | POA: Diagnosis not present

## 2023-10-19 ENCOUNTER — Other Ambulatory Visit (HOSPITAL_COMMUNITY): Payer: Self-pay | Admitting: Psychiatry

## 2023-10-21 ENCOUNTER — Other Ambulatory Visit (HOSPITAL_COMMUNITY): Payer: Self-pay | Admitting: *Deleted

## 2023-10-21 MED ORDER — CLONAZEPAM 0.5 MG PO TABS: ORAL_TABLET | ORAL | 0 refills | Status: DC

## 2023-10-26 ENCOUNTER — Telehealth (HOSPITAL_COMMUNITY): Payer: Self-pay | Admitting: *Deleted

## 2023-10-26 MED ORDER — CLONAZEPAM 0.5 MG PO TABS: ORAL_TABLET | ORAL | 0 refills | Status: DC

## 2023-10-26 NOTE — Telephone Encounter (Signed)
 Pt of Dr. Tasia was scheduled to see him 10/27/23 but has been rescheduled by office to 12/16/23. Bridge was sent in on 10/21/23 of Klonopin  0.5 mg 1/2 a tablet BID and 1 tab every day PRN #[id number]. Pt will need another prescription please.

## 2023-10-26 NOTE — Telephone Encounter (Signed)
Will inform pt.

## 2023-10-26 NOTE — Telephone Encounter (Signed)
 I sent bridge prescription of Klonopin  0.25 mg twice a day #30 tablets to his pharmacy.  Need to follow-up with Dr. Vola

## 2023-10-27 ENCOUNTER — Ambulatory Visit (HOSPITAL_COMMUNITY): Payer: Medicare (Managed Care) | Admitting: Psychiatry

## 2023-11-09 ENCOUNTER — Telehealth (HOSPITAL_COMMUNITY): Payer: Self-pay

## 2023-11-09 NOTE — Telephone Encounter (Signed)
 Patient called the ofice for an early fill of his Klonopin . Patient is currently on 0.5 mg, 1/2 tablet bid. Patient said that this is a childs dose and it does not help so he has to take extra. I explained to the patient the reasons for reducing his dose per the provider and patient said that while he understands the reasons, that does not help him. He stated that he had spoken to his PCP and she is willing to write him Ativan  which is what he would prefer. I advised him that he can do that if he likes, I will let Dr. Tasia know. Patient is not sure if he is going to keep his appointment as he does not need the Zoloft  and he feels it would just be a waste of time.

## 2023-11-11 ENCOUNTER — Telehealth (HOSPITAL_COMMUNITY): Payer: Self-pay | Admitting: Psychiatry

## 2023-11-11 ENCOUNTER — Encounter (HOSPITAL_COMMUNITY): Payer: Self-pay | Admitting: Psychiatry

## 2023-11-11 DIAGNOSIS — G8918 Other acute postprocedural pain: Secondary | ICD-10-CM | POA: Diagnosis not present

## 2023-11-11 NOTE — Telephone Encounter (Signed)
 Per patient, will be seeking mental health elsewhere and would not like to schedule any future appointments at our office. Dismissal letter has been sent via mychart and certified mail on 11/11/2023

## 2023-11-18 ENCOUNTER — Inpatient Hospital Stay: Payer: Medicare (Managed Care) | Admitting: Oncology

## 2023-11-18 ENCOUNTER — Inpatient Hospital Stay: Payer: Medicare (Managed Care)

## 2023-11-18 DIAGNOSIS — M549 Dorsalgia, unspecified: Secondary | ICD-10-CM | POA: Diagnosis not present

## 2023-11-18 DIAGNOSIS — Z23 Encounter for immunization: Secondary | ICD-10-CM | POA: Diagnosis not present

## 2023-11-18 DIAGNOSIS — I6509 Occlusion and stenosis of unspecified vertebral artery: Secondary | ICD-10-CM | POA: Diagnosis not present

## 2023-11-18 DIAGNOSIS — I10 Essential (primary) hypertension: Secondary | ICD-10-CM | POA: Diagnosis not present

## 2023-11-18 DIAGNOSIS — Z299 Encounter for prophylactic measures, unspecified: Secondary | ICD-10-CM | POA: Diagnosis not present

## 2023-11-19 ENCOUNTER — Ambulatory Visit: Payer: Medicare (Managed Care) | Admitting: Hematology

## 2023-11-19 ENCOUNTER — Other Ambulatory Visit: Payer: Medicare (Managed Care)

## 2023-11-25 ENCOUNTER — Other Ambulatory Visit: Payer: Self-pay

## 2023-11-25 DIAGNOSIS — C8330 Diffuse large B-cell lymphoma, unspecified site: Secondary | ICD-10-CM

## 2023-11-26 ENCOUNTER — Inpatient Hospital Stay (HOSPITAL_BASED_OUTPATIENT_CLINIC_OR_DEPARTMENT_OTHER): Payer: Medicare (Managed Care) | Admitting: Oncology

## 2023-11-26 ENCOUNTER — Inpatient Hospital Stay: Payer: Medicare (Managed Care) | Attending: Oncology

## 2023-11-26 VITALS — BP 148/93 | HR 69 | Temp 98.1°F | Resp 18 | Ht 72.0 in | Wt 184.0 lb

## 2023-11-26 DIAGNOSIS — C8330 Diffuse large B-cell lymphoma, unspecified site: Secondary | ICD-10-CM

## 2023-11-26 DIAGNOSIS — Z981 Arthrodesis status: Secondary | ICD-10-CM | POA: Diagnosis not present

## 2023-11-26 DIAGNOSIS — R1909 Other intra-abdominal and pelvic swelling, mass and lump: Secondary | ICD-10-CM | POA: Diagnosis not present

## 2023-11-26 LAB — COMPREHENSIVE METABOLIC PANEL WITH GFR
ALT: 15 U/L (ref 0–44)
AST: 24 U/L (ref 15–41)
Albumin: 4.4 g/dL (ref 3.5–5.0)
Alkaline Phosphatase: 49 U/L (ref 38–126)
Anion gap: 10 (ref 5–15)
BUN: 13 mg/dL (ref 8–23)
CO2: 26 mmol/L (ref 22–32)
Calcium: 9.4 mg/dL (ref 8.9–10.3)
Chloride: 100 mmol/L (ref 98–111)
Creatinine, Ser: 0.68 mg/dL (ref 0.61–1.24)
GFR, Estimated: >60 mL/min (ref >60)
Glucose, Bld: 107 mg/dL — ABNORMAL HIGH (ref 70–99)
Potassium: 4.3 mmol/L (ref 3.5–5.1)
Sodium: 136 mmol/L (ref 135–145)
Total Bilirubin: 0.5 mg/dL (ref 0.0–1.2)
Total Protein: 7 g/dL (ref 6.5–8.1)

## 2023-11-26 LAB — CBC WITH DIFFERENTIAL/PLATELET
Abs Immature Granulocytes: 0.04 K/uL (ref 0.00–0.07)
Basophils Absolute: 0.2 K/uL — ABNORMAL HIGH (ref 0.0–0.1)
Basophils Relative: 2 %
Eosinophils Absolute: 0.5 K/uL (ref 0.0–0.5)
Eosinophils Relative: 8 %
HCT: 40 % (ref 39.0–52.0)
Hemoglobin: 14.3 g/dL (ref 13.0–17.0)
Immature Granulocytes: 1 %
Lymphocytes Relative: 33 %
Lymphs Abs: 2.1 K/uL (ref 0.7–4.0)
MCH: 33.7 pg (ref 26.0–34.0)
MCHC: 35.8 g/dL (ref 30.0–36.0)
MCV: 94.3 fL (ref 80.0–100.0)
Monocytes Absolute: 0.5 K/uL (ref 0.1–1.0)
Monocytes Relative: 8 %
Neutro Abs: 3.2 K/uL (ref 1.7–7.7)
Neutrophils Relative %: 48 %
Platelets: 229 K/uL (ref 150–400)
RBC: 4.24 MIL/uL (ref 4.22–5.81)
RDW: 13.2 % (ref 11.5–15.5)
WBC: 6.5 K/uL (ref 4.0–10.5)
nRBC: 0 % (ref 0.0–0.2)

## 2023-11-26 LAB — LACTATE DEHYDROGENASE: LDH: 221 U/L — ABNORMAL HIGH (ref 98–192)

## 2023-11-26 NOTE — Assessment & Plan Note (Signed)
 Left inguinal bulge. Given history of lymphoma, would like to get an ultrasound of his left inguinal region including his left lower abdomen and scrotum.  Orders placed.  Will get this scheduled ASAP.  If this is indeed a hernia, patient can be discharged from our clinic.

## 2023-11-26 NOTE — Assessment & Plan Note (Addendum)
-   He does not report any night sweats, fevers or weight loss. - Physical exam: No palpable adenopathy or splenomegaly. - Labs on 11/26/2023: Normal LFTs.  CBC grossly normal.  LDH is slightly elevated likely secondary to recent lumbar fusion. - CT angiogram from August 2024 did not show any evidence of adenopathy. - Recommend follow-up in 1 year with repeat labs and exam.  If he remains stable, we will discharge him from the clinic at next visit.

## 2023-11-26 NOTE — Assessment & Plan Note (Signed)
-   He does not report any night sweats, fevers or weight loss. - Physical exam: No palpable adenopathy or splenomegaly. - Labs on 11/12/2022: Normal LFTs.  CBC grossly normal.  LDH is normal. - CT angiogram from August 2024 did not show any evidence of adenopathy. - Recommend follow-up in 1 year with repeat labs and exam.  If he remains stable, we will discharge him from the clinic at next visit.

## 2023-11-26 NOTE — Progress Notes (Signed)
 [facility] OFFICE PROGRESS NOTE  Maree Isles, MD  ASSESSMENT & PLAN:    Assessment & Plan Diffuse large B-cell lymphoma, unspecified body region [facility]) - He does not report any night sweats, fevers or weight loss. - Physical exam: No palpable adenopathy or splenomegaly. - Labs on 11/26/2023: Normal LFTs.  CBC grossly normal.  LDH is slightly elevated likely secondary to recent lumbar fusion. - CT angiogram from August 2024 did not show any evidence of adenopathy. - Recommend follow-up in 1 year with repeat labs and exam.  If he remains stable, we will discharge him from the clinic at next visit. S/P lumbar fusion Status post lumbar fusion on 10/02/2023. Reports he is finally doing better.  He has been ambulating with a walker but switched to a cane over the past week or so. Reports she still has some pain in his lower back but overall is doing well. Inguinal bulge Left inguinal bulge. Given history of lymphoma, would like to get an ultrasound of his left inguinal region including his left lower abdomen and scrotum.  Orders placed.  Will get this scheduled ASAP.  If this is indeed a hernia, patient can be discharged from our clinic.  Orders Placed This Encounter  Procedures   US  Abdomen Complete    Standing Status:   Future    Expected Date:   12/03/2023    Expiration Date:   11/25/2024    Reason for exam::   left inguinal    Preferred imaging location?:   [facility]   US  SCROTUM W/DOPPLER    Standing Status:   Future    Expected Date:   12/03/2023    Expiration Date:   11/25/2024    Reason for Exam (SYMPTOM  OR DIAGNOSIS REQUIRED):   left ingunial pain    Preferred imaging location?:   [facility]    INTERVAL HISTORY: Patient returns for follow-up for B-cell lymphoma.  Patient recently had lumbar fusion with Dr. Kit on 10/02/2023.  Tolerated well but has had some postsurgical pain.  He has been using a walker and cane for stability.  Feels like  things are improving he is no longer using narcotics.  He is using OTC Tylenol  and ibuprofen.  Appetite is 75% energy levels are 40%.  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  He occasionally will feel bloated but he is having normal bowel movements.  Reports he recently palpated a hernia in his left inguinal region.  Reports his PCP was going to work this up but they put the wrong order in and it has never been rescheduled.  Reports has been present for several months prior to his lumbar fusion.  Denies any significant pain to his left inguinal area.  Sometimes he will have some swelling that goes down into his scrotum.  We reviewed CBC with differential, CMP and LDH.  SUMMARY OF HEMATOLOGIC HISTORY: Oncology History Overview Note  1.  Stage IVB DLBCL: - Presentation with night sweats, 40 pound weight loss, nausea. -Treated with 6 cycles of chemotherapy at [facility], completed on 10/27/2013.   DLBCL (diffuse large B cell lymphoma) (HCC)  07/07/2013 Initial Biopsy   CT guided right iliac bone marrow aspiration and core biopsy.   07/07/2013 Pathology Results   Bone Marrow Flow Cytometry - PREDOMINANCE OF T-LYMPHOCYTES WITH NONSPECIFIC REVERSAL OF THE CD4:CD8 RATIO. - NO MONOCLONAL B-CELL POPULATION IDENTIFIED.  Bone Marrow, Aspirate,Biopsy, and Clot, right iliac - SLIGHTLY HYPERCELLULAR BONE MARROW FOR AGE WITH TRILINEAGE HEMATOPOIESIS. - NEGATIVE FOR LYMPHOMA - SEE COMMENT. PERIPHERAL BLOOD: - NORMOCYTIC-NORMOCHROMIC ANEMIA. - NEUTROPHILIC LEFT SHIFT.   07/13/2013 - 10/27/2013 Chemotherapy   R-CHOP x 6 cycles by Dr. Lyndol at [facility]    09/01/2013 PET scan   Minimal metabolic activity associated with the splenic lesion. Otherwise complete response to chemotherapy. No residual hypermetabolic lymph nodes in the skullbase to thigh PET scan. Marked reduction in  lingular consolidation   09/01/2013 PET scan   1. Minimal  metabolic activity associated with the splenic lesion. Otherwise complete response to chemotherapy. 1. No residual hypermetabolic lymph nodes in the skullbase to thigh PET scan.   2. Marked reduction in  lingular consolidation   11/28/2013 PET scan   1. Today's study is very similar to prior study from 09/01/2013. Specifically, the ill-defined low-attenuation lesion in the anterior aspect of the spleen is slightly smaller, currently measuring 2.1 x 2.3 cm, and continues to demonstrate some very low-level metabolic activity (SUVmax = 2.9). 2. There is a persistent mass-like opacity in the posterior aspect of the left upper lobe, which also appears slightly smaller than the prior examination. This too demonstrates some low-level metabolic activity (SUVmax = 2.7). While this may simply represent a resolving post infectious or inflammatory scar, if there is any clinical concern that the opacity on the original study from 06/09/2013 that this was in fact a lymphomatous infiltrate, this could represent a residual focus of disease. 3. No new foci of disease noted in the neck, chest, abdomen or pelvis. 4. Atherosclerosis, including left main and 3 vessel coronary artery disease. Please note that although the presence of coronary artery calcium  documents the presence of coronary artery disease, the severity of this disease and any potential stenosis cannot be assessed on this non-gated CT examination. Assessment for potential risk factor modification, dietary therapy or pharmacologic therapy may be warranted, if clinically indicated. 5. Mild cardiomegaly.   07/17/2015 Initial Diagnosis   DLBCL (diffuse large B cell lymphoma) (HCC)   10/17/2015 Imaging   CT CAP- Stable exam. No evidence for lymphadenopathy in the chest, abdomen, or pelvis. No new or progressive interval findings. 2. 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      CBC    Component Value  Date/Time   WBC 6.5 11/26/2023 1004   RBC 4.24 11/26/2023 1004   HGB 14.3 11/26/2023 1004   HCT 40.0 11/26/2023 1004   PLT 229 11/26/2023 1004   MCV 94.3 11/26/2023 1004   MCH 33.7 11/26/2023 1004   MCHC 35.8 11/26/2023 1004   RDW 13.2 11/26/2023 1004   LYMPHSABS 2.1 11/26/2023 1004   MONOABS 0.5 11/26/2023 1004   EOSABS 0.5 11/26/2023 1004   BASOSABS 0.2 (H) 11/26/2023 1004       Latest Ref Rng & Units 11/26/2023   10:04 AM 09/25/2023   10:59 AM 11/12/2022    2:10 PM  CMP  Glucose 70 - 99 mg/dL 892  84  97   BUN 8 - 23 mg/dL 13  15  12    Creatinine 0.61 - 1.24 mg/dL 9.31  9.25  9.18   Sodium 135 - 145 mmol/L 136  134  133   Potassium 3.5 - 5.1 mmol/L 4.3  3.6  3.9   Chloride 98 - 111 mmol/L 100  97  100   CO2 22 - 32 mmol/L 26  25  26    Calcium  8.9 -  10.3 mg/dL 9.4  9.3  8.7   Total Protein 6.5 - 8.1 g/dL 7.0   7.0   Total Bilirubin 0.0 - 1.2 mg/dL 0.5   1.1   Alkaline Phos 38 - 126 U/L 49   39   AST 15 - 41 U/L 24   21   ALT 0 - 44 U/L 15   19      Lab Results  Component Value Date   VITAMINB12 439 05/27/2022    Vitals:   11/26/23 1111 11/26/23 1119  BP: (!) 143/94 (!) 148/93  Pulse: 69   Resp: 18   Temp: 98.1 F (36.7 C)   SpO2: 98%     Review of System:  Review of Systems  Constitutional:  Positive for malaise/fatigue.  Respiratory:  Positive for shortness of breath.   Gastrointestinal:  Positive for abdominal pain and constipation.  Neurological:  Positive for dizziness, tingling and headaches.    Physical Exam: Physical Exam Constitutional:      Appearance: Normal appearance.  HENT:     Head: Normocephalic and atraumatic.  Eyes:     Pupils: Pupils are equal, round, and reactive to light.  Cardiovascular:     Rate and Rhythm: Normal rate and regular rhythm.     Heart sounds: Normal heart sounds. No murmur heard. Pulmonary:     Effort: Pulmonary effort is normal.     Breath sounds: Normal breath sounds. No wheezing.  Abdominal:      General: Bowel sounds are normal. There is no distension.     Palpations: Abdomen is soft.     Tenderness: There is no abdominal tenderness.  Musculoskeletal:        General: Normal range of motion.     Cervical back: Normal range of motion.  Lymphadenopathy:     Lower Body: Left inguinal adenopathy present.     Comments: Unsure if this is lymphadenopathy versus hernia.  Skin:    General: Skin is warm and dry.     Findings: No rash.  Neurological:     Mental Status: He is alert and oriented to person, place, and time.     Gait: Gait is intact.  Psychiatric:        Mood and Affect: Mood and affect normal.        Cognition and Memory: Memory normal.        Judgment: Judgment normal.      I spent 25 minutes dedicated to the care of this patient (face-to-face and non-face-to-face) on the date of the encounter to include what is described in the assessment and plan.,  Delon Hope, NP 11/26/2023 3:08 PM

## 2023-11-26 NOTE — Assessment & Plan Note (Addendum)
 Status post lumbar fusion on 10/02/2023. Reports he is finally doing better.  He has been ambulating with a walker but switched to a cane over the past week or so. Reports she still has some pain in his lower back but overall is doing well.

## 2023-12-03 ENCOUNTER — Other Ambulatory Visit: Payer: Self-pay | Admitting: Oncology

## 2023-12-03 ENCOUNTER — Ambulatory Visit (HOSPITAL_COMMUNITY)
Admission: RE | Admit: 2023-12-03 | Discharge: 2023-12-03 | Disposition: A | Discharge status: Home / Self Care | Payer: Medicare (Managed Care) | Source: Ambulatory Visit | Attending: Oncology | Admitting: Oncology

## 2023-12-03 ENCOUNTER — Ambulatory Visit (HOSPITAL_COMMUNITY)
Admission: RE | Admit: 2023-12-03 | Discharge: 2023-12-03 | Payer: Medicare (Managed Care) | Attending: Oncology | Admitting: Oncology

## 2023-12-03 DIAGNOSIS — C8330 Diffuse large B-cell lymphoma, unspecified site: Secondary | ICD-10-CM | POA: Insufficient documentation

## 2023-12-03 DIAGNOSIS — R1909 Other intra-abdominal and pelvic swelling, mass and lump: Secondary | ICD-10-CM

## 2023-12-03 DIAGNOSIS — R1032 Left lower quadrant pain: Secondary | ICD-10-CM | POA: Diagnosis not present

## 2023-12-03 DIAGNOSIS — Z981 Arthrodesis status: Secondary | ICD-10-CM

## 2023-12-09 ENCOUNTER — Ambulatory Visit: Payer: Self-pay | Admitting: Oncology

## 2023-12-09 NOTE — Progress Notes (Signed)
 Results relayed to patient and follow up made for 1 year per OVN.  Patient verbalized understanding.

## 2023-12-09 NOTE — Progress Notes (Signed)
 His scrotal ultrasound did show a small cyst would potentially could be contributing some to the swelling he was experiencing.  Overall though great findings.  Delon Hope, NP 12/09/2023 8:45 AM

## 2023-12-16 ENCOUNTER — Encounter (HOSPITAL_COMMUNITY): Payer: Self-pay

## 2023-12-16 ENCOUNTER — Ambulatory Visit (HOSPITAL_COMMUNITY): Payer: Medicare (Managed Care) | Admitting: Psychiatry

## 2023-12-31 DIAGNOSIS — S8012XA Contusion of left lower leg, initial encounter: Secondary | ICD-10-CM | POA: Diagnosis not present

## 2023-12-31 DIAGNOSIS — W1839XA Other fall on same level, initial encounter: Secondary | ICD-10-CM | POA: Diagnosis not present

## 2023-12-31 DIAGNOSIS — M542 Cervicalgia: Secondary | ICD-10-CM | POA: Diagnosis not present

## 2023-12-31 DIAGNOSIS — W19XXXA Unspecified fall, initial encounter: Secondary | ICD-10-CM | POA: Diagnosis not present

## 2023-12-31 DIAGNOSIS — M25559 Pain in unspecified hip: Secondary | ICD-10-CM | POA: Diagnosis not present

## 2023-12-31 DIAGNOSIS — S9031XA Contusion of right foot, initial encounter: Secondary | ICD-10-CM | POA: Diagnosis not present

## 2023-12-31 DIAGNOSIS — M79606 Pain in leg, unspecified: Secondary | ICD-10-CM | POA: Diagnosis not present

## 2023-12-31 DIAGNOSIS — S79822A Other specified injuries of left thigh, initial encounter: Secondary | ICD-10-CM | POA: Diagnosis not present

## 2023-12-31 DIAGNOSIS — S9032XA Contusion of left foot, initial encounter: Secondary | ICD-10-CM | POA: Diagnosis not present

## 2023-12-31 DIAGNOSIS — S3983XA Other specified injuries of pelvis, initial encounter: Secondary | ICD-10-CM | POA: Diagnosis not present

## 2024-01-09 DIAGNOSIS — M25562 Pain in left knee: Secondary | ICD-10-CM | POA: Diagnosis not present

## 2024-01-12 DIAGNOSIS — I10 Essential (primary) hypertension: Secondary | ICD-10-CM | POA: Diagnosis not present

## 2024-01-12 DIAGNOSIS — M1710 Unilateral primary osteoarthritis, unspecified knee: Secondary | ICD-10-CM | POA: Diagnosis not present

## 2024-01-12 DIAGNOSIS — R52 Pain, unspecified: Secondary | ICD-10-CM | POA: Diagnosis not present

## 2024-01-12 DIAGNOSIS — Z299 Encounter for prophylactic measures, unspecified: Secondary | ICD-10-CM | POA: Diagnosis not present

## 2024-01-12 DIAGNOSIS — E7800 Pure hypercholesterolemia, unspecified: Secondary | ICD-10-CM | POA: Diagnosis not present

## 2024-01-12 DIAGNOSIS — R5383 Other fatigue: Secondary | ICD-10-CM | POA: Diagnosis not present

## 2024-01-12 DIAGNOSIS — I6509 Occlusion and stenosis of unspecified vertebral artery: Secondary | ICD-10-CM | POA: Diagnosis not present

## 2024-01-18 DIAGNOSIS — N390 Urinary tract infection, site not specified: Secondary | ICD-10-CM | POA: Diagnosis not present

## 2024-01-18 DIAGNOSIS — N179 Acute kidney failure, unspecified: Secondary | ICD-10-CM | POA: Diagnosis not present

## 2024-01-18 DIAGNOSIS — R1900 Intra-abdominal and pelvic swelling, mass and lump, unspecified site: Secondary | ICD-10-CM | POA: Diagnosis not present

## 2024-01-18 DIAGNOSIS — R1032 Left lower quadrant pain: Secondary | ICD-10-CM | POA: Diagnosis not present

## 2024-01-18 DIAGNOSIS — R109 Unspecified abdominal pain: Secondary | ICD-10-CM | POA: Diagnosis not present

## 2024-01-18 NOTE — ED Provider Notes (Signed)
 "                                                                                   Emergency Department Provider Note    ED Clinical Impression   Final diagnoses:  Abdominal pain, unspecified abdominal location (Primary)  Abdominal mass, unspecified abdominal location  Acute kidney injury  Acute UTI    ED Assessment/Plan    Condition: Stable Disposition: Discharge  This chart has been completed using Dragon Medical Dictation software, and while attempts have been made to ensure accuracy, certain words and phrases may not be transcribed as intended.   History   Chief Complaint  Patient presents with   Abdominal Pain   Fatigue   HPI  Douglas Bryant is a 73 y.o. male  who presents today to the  emergency department complaining of abdominal pain intermittently for the past couple of weeks.  Pain has been waxing waning for the mid abdomen into the left lower quadrant.  Patient describes decreased appetite over the past couple of weeks.  She also describes some abdominal distention.  He describes symptoms as moderate.  There are no obvious aggravating or relieving factors.    Allergies: is allergic to tamsulosin and tamsulosin hcl. Medications: is not on any long-term medications. PMHx:  has no past medical history on file. PSHx:  has no past surgical history on file. SocHx:   Allergies, Medications, Medical, Surgical, and Social History were reviewed as documented above.   Social Drivers of Health with Concerns   Food Insecurity: Not on file  Transportation Needs: Not on file  Alcohol  Use: Not on file  Housing: Not on file  Physical Activity: Not on file  Utilities: Not on file  Stress: Not on file  Interpersonal Safety: Not on file  Substance Use: Not on file (12/06/2022)  Intimate Partner Violence: Not on file  Social Connections: Not on file  Financial Resource Strain: Not on file  Health Literacy: Not on file  Internet Connectivity: Not on file      Review Of Systems  Review of Systems  Constitutional:  Positive for appetite change. Negative for fever.  HENT:  Negative for congestion.   Respiratory:  Negative for chest tightness and shortness of breath.   Cardiovascular:  Negative for chest pain.  Gastrointestinal:  Positive for abdominal pain, constipation, nausea and vomiting. Negative for diarrhea.  Skin:  Negative for color change.  Psychiatric/Behavioral:  Negative for behavioral problems.   All other systems reviewed and are negative.   Physical Exam   BP 124/71   Pulse 88   Temp 36.6 C (97.8 F) (Oral)   Resp 21   Ht 185.4 cm (6' 1)   Wt 83.2 kg (183 lb 8 oz)   SpO2 95%   BMI 24.21 kg/m   Physical Exam Vitals and nursing note reviewed.  Constitutional:      General: He is not in acute distress. HENT:     Head: Normocephalic.  Eyes:     Conjunctiva/sclera: Conjunctivae normal.  Cardiovascular:     Rate and Rhythm: Regular rhythm.     Pulses: Normal pulses.     Heart sounds: Normal  heart sounds.  Pulmonary:     Effort: No respiratory distress.     Breath sounds: Normal breath sounds.  Abdominal:     General: There is distension.     Tenderness: There is abdominal tenderness. There is no guarding or rebound.     Comments: There is slight abdominal distention.  There is slight epigastric and left lower quadrant tenderness.  No guarding or rebound.  Normoactive bowel sounds.  Musculoskeletal:        General: No deformity.  Skin:    General: Skin is warm.     Capillary Refill: Capillary refill takes 2 to 3 seconds.     Comments: Normal cap refill.  Neurological:     General: No focal deficit present.  Psychiatric:        Mood and Affect: Mood normal.     ED Course  Medical Decision Making Differential diagnosis includes gastritis versus pancreatitis versus bowel obstruction versus diverticulitis.  Gallbladder disease is also possible given epigastric tenderness.  Will get CT scan given left  lower quadrant tenderness.  1:21 PM Patient is doing well.  Labs reviewed.  2:35 PM Patient is doing better.  Still has some slight discomfort.  3:22 PM Labs and imaging reviewed.  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  I discussed CT findings with patient.  Patient will need follow-up for further evaluation including biopsy.  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  Renal function also suggest AKI.  She had decision making done with patient.  Patient will drink plenty of fluids.  Patient will need to follow-up with PCP for close evaluation.  Will put him on Keflex for UTI.  He is stable for discharge.  He would like something else for pain first.  4:37 PM Patient is doing well.  Stable for discharge.  I have reviewed my clinical findings and studies and my clinical impression with the patient. The patient has expressed understanding that at this time there is no evidence for a more malignant underlying process, but the patient also understands that early in the process of a condition such as this, an initial workup can be falsely reassuring. I have counseled the patient and discussed follow-up with the patient, stressing the importance of appropriate follow-up. I have also counseled the patient to return if worse or any concerns. Routine discharge counseling was given to the patient and the patient understands that worsening, changing or persistent symptoms should prompt an immediate call or follow up with their primary physician or return to the emergency department for reevaluation. Patient has expressed understanding.     Problems Addressed: Abdominal mass, unspecified abdominal location: acute illness or injury Abdominal pain, unspecified abdominal location: acute illness or injury Acute kidney injury: acute illness or injury that poses a threat to life or bodily functions Acute  UTI: acute illness or injury that poses a threat to life or bodily functions  Amount and/or Complexity of Data Reviewed Labs: ordered. Decision-making details documented in ED Course. Radiology: ordered. Decision-making details documented in ED Course.  Risk Prescription drug management. Parenteral controlled substances. Decision regarding hospitalization.     Procedures   No results found for this visit on 01/18/24 (from the past 4464 hours).   ED Results Results for orders placed or performed during the hospital encounter of 01/18/24  Lipase Level  Result Value Ref Range   Lipase 48  16 - 77 U/L  Magnesium  Level  Result Value Ref Range   Magnesium  1.9 1.6 - 2.6 mg/dL  Comprehensive Metabolic Panel  Result Value Ref Range   Sodium 134 (L) 135 - 145 mmol/L   Potassium 4.1 3.5 - 5.0 mmol/L   Chloride 91 (L) 98 - 107 mmol/L   CO2 16.5 (L) 21.0 - 32.0 mmol/L   Anion Gap >15 (H) 3 - 11 mmol/L   BUN 29 (H) 8 - 20 mg/dL   Creatinine 7.40 (H) 9.19 - 1.30 mg/dL   BUN/Creatinine Ratio 11    eGFR CKD-EPI (2021) Male 25 (L) >=60 mL/min/1.58m2   Glucose 63 (L) 70 - 179 mg/dL   Calcium  9.4 8.5 - 10.1 mg/dL   Albumin 3.6 3.5 - 5.0 g/dL   Total Protein 7.3 6.0 - 8.0 g/dL   Total Bilirubin 0.9 0.3 - 1.2 mg/dL   AST 77 (H) 15 - 40 U/L   ALT 26 12 - 78 U/L   Alkaline Phosphatase 40 (L) 46 - 116 U/L  POCT Glucose  Result Value Ref Range   Glucose, POC 59 (L) 70 - 105 mg/dL  POCT Glucose  Result Value Ref Range   Glucose, POC 71 70 - 105 mg/dL  POCT Glucose  Result Value Ref Range   Glucose, POC 72 70 - 105 mg/dL  CBC w/ Differential  Result Value Ref Range   WBC 17.1 (H) 4.0 - 10.5 10*9/L   RBC 4.00 (L) 4.10 - 5.60 10*12/L   HGB 13.1 12.5 - 17.0 g/dL   HCT 63.4 63.9 - 49.9 %   MCV 91.3 80.0 - 98.0 fL   MCH 32.8 27.0 - 34.0 pg   MCHC 35.9 32.0 - 36.0 g/dL   RDW 84.7 (H) 88.4 - 85.4 %   MPV 8.4 7.4 - 10.4 fL   Platelet 487 (H) 140 - 415 10*9/L  Urinalysis with Microscopy  with Culture Reflex  Result Value Ref Range   Color, UA Yellow    Clarity, UA Cloudy (A) Clear   Specific Gravity, UA 1.023 1.010 - 1.025   pH, UA 5.5 5.0 - 8.0   Leukocyte Esterase, UA Negative Negative   Nitrite, UA Negative Negative   Protein, UA 50 mg/dL (A) Negative   Glucose, UA Negative Negative, Trace   Ketones, UA 40 mg/dL (A) Negative   Urobilinogen, UA 2.0 mg/dL (A) <7.9 mg/dL   Bilirubin, UA Small (A) Negative   Blood, UA Small (A) Negative   RBC, UA 2 0 - 3 /HPF   WBC, UA 10 (H) 0 - 3 /HPF   Squam Epithel, UA 13 (H) 0 - 10 /HPF   Bacteria, UA None Seen None Seen /HPF   Granular Casts, UA 13 (H) <=0 /LPF   WBC Clumps None Seen None Seen /HPF   Hyphal Yeast None Seen None Seen /HPF   Hyaline Casts, UA 106 (H) <=0 /LPF   Yeast, UA None Seen None Seen /HPF  Manual Differential  Result Value Ref Range   Neutrophils % 59 %   Lymphocytes % 15 %   Monocytes % 6 %   Eosinophils % 1 %   Metamyelocytes % 5 %   Myelocytes % 2 %   Bands % 8 %   Atypical Lymphocytes % 4 %   Absolute Neutrophils 11.5 (H) 1.8 - 7.8 10*9/L   Absolute Lymphocytes 3.2 0.7 - 4.5 10*9/L   Absolute Monocytes 1.0 0.1 - 1.0 10*9/L   Absolute Eosinophils 0.2 0.0 -  0.4 10*9/L   Absolute Basophils     CT Abdomen Pelvis Wo Contrast Result Date: 01/18/2024 Exam:  CT of the Abdomen and Pelvis without Contrast  History:  Abdominal pain, decreased appetite  Technique: Routine CT of the abdomen and pelvis without IV contrast.  AEC (automated exposure control) and/or manual techniques such as size-specific kV and mAs are employed where appropriate to reduce radiation exposure for all CT exams.  Comparison:   None  Abdomen and Pelvis CT Findings:  Evaluation is limited in the absence of IV contrast.  LOWER THORAX: The visualized lung bases are unremarkable. No pleural or pericardial effusion.  LIVER: The liver is normal in morphology.   BILIARY TRACT: No intrahepatic or extrahepatic biliary ductal dilation.   Unremarkable gallbladder.  PANCREAS: Unremarkable.  SPLEEN: Normal in size.  ADRENALS: Normal.  KIDNEYS/URETERS: Symmetric in size and normal in contour. No renal stones.  No hydronephrosis.  BLADDER: Unremarkable  PELVIC ORGANS: Unremarkable.  BOWEL/MESENTERY: No abnormal bowel dilation. Large ill-defined mass in the right mid abdomen which abuts the second/third portions of the duodenum, multiple loops of distal small bowel, and the right colon, and measures up to approximately 10.3 x 6.9 x 7.7 cm (series 2, image 82). There is diffuse soft tissue nodularity in the omentum and areas of peritoneal thickening, which are most prominent in the deep pelvis and bilateral paracolic gutters. Moderate volume ascites.  ABDOMINAL VASCULATURE: No abdominal aortic aneurysm.  Moderate atherosclerotic calcifications.  LYMPH NODES: Multiple enlarged upper abdominal/retroperitoneal and mesenteric lymph nodes. A reference aortocaval node measures 1.3 cm (series 2, image 57). The mesenteric nodes are difficult to distinguish from the right abdominal mass, however a reference node in the central mesentery measures approximately 1.5 cm (series 2, image 84).  ABDOMINAL WALL: Unremarkable  BONES:  No aggressive appearing osseous lesions or acute osseous abnormalities. L3-L4 fusion hardware.      1.    Large ill-defined mass in the right mid abdomen which abuts the duodenum, distal small bowel, and right colon. This is highly concerning for malignancy, although the exact origin of the mass is difficult to determine on this noncontrast examination. Differential includes malignancy of small or large bowel origin or lymphoma. 2.    Diffuse soft tissue nodularity in the omentum and areas of peritoneal thickening, compatible with peritoneal carcinomatosis/lymphomatosis. 3.    Moderate volume ascites. 4.    Multiple enlarged upper abdominal/retroperitoneal and mesenteric lymph nodes, compatible with nodal metastases.  Signed (Electronic  Signature): 01/18/2024 12:29 PM Signed By: Reyes Agreste, MD   Medications Administered:  Medications  morphine  4 mg/mL injection 4 mg (4 mg Intravenous Given 01/18/24 1055)  sodium chloride  0.9% (NS) bolus 1,000 mL (0 mL Intravenous Stopped 01/18/24 1217)  pantoprazole (Protonix) injection 40 mg (40 mg Intravenous Given 01/18/24 1054)  ondansetron  (ZOFRAN ) injection 4 mg (4 mg Intravenous Given 01/18/24 1055)  dextrose  50 % in water  (D50W) 50 % solution 25 g (25 g Intravenous Given 01/18/24 1214)  sodium chloride  0.9% (NS) bolus 1,000 mL (0 mL Intravenous Stopped 01/18/24 1413)  morphine  4 mg/mL injection 2 mg (2 mg Intravenous Given 01/18/24 1602)  cephalexin (KEFLEX) capsule 500 mg (500 mg Oral Given 01/18/24 1602)    Discharge Medications (Medications Prescribed during this  ED visit and Patient's Home Medications) :    Your Medication List     START taking these medications    cephalexin 500 MG capsule Commonly known as: KEFLEX Take 1 capsule (500 mg total) by mouth Three (  3) times a day for 10 days.   ondansetron  4 MG disintegrating tablet Commonly known as: ZOFRAN -ODT Take 1 tablet (4 mg total) by mouth every eight (8) hours as needed for nausea for up to 7 days.   traMADol 50 mg tablet Commonly known as: ULTRAM Take 1 tablet (50 mg total) by mouth every six (6) hours as needed for up to 5 days.       ASK your doctor about these medications    methocarbamol  500 MG tablet Commonly known as: ROBAXIN  Take 1 tablet (500 mg total) by mouth two (2) times a day for 10 days. Ask about: Should I take this medication?   traMADol 50 mg tablet Commonly known as: ULTRAM Take 1 tablet (50 mg total) by mouth every six (6) hours as needed for up to 5 days. Ask about: Should I take this medication?          Cherie Ardeen Hanger, MD 01/18/24 [phone]  "

## 2024-01-22 ENCOUNTER — Inpatient Hospital Stay (HOSPITAL_COMMUNITY)
Admission: EM | Admit: 2024-01-22 | Discharge: 2024-02-28 | DRG: 840 | Disposition: E | Payer: Medicare (Managed Care) | Attending: Family Medicine | Admitting: Family Medicine

## 2024-01-22 ENCOUNTER — Inpatient Hospital Stay (HOSPITAL_COMMUNITY): Payer: Medicare (Managed Care)

## 2024-01-22 ENCOUNTER — Other Ambulatory Visit: Payer: Self-pay

## 2024-01-22 ENCOUNTER — Emergency Department (HOSPITAL_COMMUNITY): Payer: Medicare (Managed Care)

## 2024-01-22 DIAGNOSIS — F32A Depression, unspecified: Secondary | ICD-10-CM | POA: Diagnosis present

## 2024-01-22 DIAGNOSIS — Z981 Arthrodesis status: Secondary | ICD-10-CM

## 2024-01-22 DIAGNOSIS — C83398 Diffuse large b-cell lymphoma of other extranodal and solid organ sites: Secondary | ICD-10-CM | POA: Diagnosis present

## 2024-01-22 DIAGNOSIS — S064X0A Epidural hemorrhage without loss of consciousness, initial encounter: Principal | ICD-10-CM

## 2024-01-22 DIAGNOSIS — E883 Tumor lysis syndrome: Secondary | ICD-10-CM | POA: Diagnosis present

## 2024-01-22 DIAGNOSIS — N179 Acute kidney failure, unspecified: Secondary | ICD-10-CM | POA: Diagnosis present

## 2024-01-22 DIAGNOSIS — R54 Age-related physical debility: Secondary | ICD-10-CM | POA: Diagnosis present

## 2024-01-22 DIAGNOSIS — Z79899 Other long term (current) drug therapy: Secondary | ICD-10-CM

## 2024-01-22 DIAGNOSIS — S065X0A Traumatic subdural hemorrhage without loss of consciousness, initial encounter: Principal | ICD-10-CM | POA: Diagnosis present

## 2024-01-22 DIAGNOSIS — I739 Peripheral vascular disease, unspecified: Secondary | ICD-10-CM | POA: Diagnosis present

## 2024-01-22 DIAGNOSIS — C833A Diffuse large b-cell lymphoma, in remission: Secondary | ICD-10-CM | POA: Diagnosis present

## 2024-01-22 DIAGNOSIS — F419 Anxiety disorder, unspecified: Secondary | ICD-10-CM | POA: Diagnosis present

## 2024-01-22 DIAGNOSIS — T730XXA Starvation, initial encounter: Secondary | ICD-10-CM

## 2024-01-22 DIAGNOSIS — Z9221 Personal history of antineoplastic chemotherapy: Secondary | ICD-10-CM

## 2024-01-22 DIAGNOSIS — C189 Malignant neoplasm of colon, unspecified: Secondary | ICD-10-CM | POA: Diagnosis not present

## 2024-01-22 DIAGNOSIS — N170 Acute kidney failure with tubular necrosis: Secondary | ICD-10-CM | POA: Diagnosis present

## 2024-01-22 DIAGNOSIS — E8729 Other acidosis: Secondary | ICD-10-CM | POA: Diagnosis not present

## 2024-01-22 DIAGNOSIS — S199XXA Unspecified injury of neck, initial encounter: Secondary | ICD-10-CM | POA: Diagnosis not present

## 2024-01-22 DIAGNOSIS — Z6825 Body mass index (BMI) 25.0-25.9, adult: Secondary | ICD-10-CM

## 2024-01-22 DIAGNOSIS — E039 Hypothyroidism, unspecified: Secondary | ICD-10-CM | POA: Diagnosis present

## 2024-01-22 DIAGNOSIS — N390 Urinary tract infection, site not specified: Secondary | ICD-10-CM | POA: Diagnosis present

## 2024-01-22 DIAGNOSIS — C8330 Diffuse large B-cell lymphoma, unspecified site: Secondary | ICD-10-CM | POA: Diagnosis present

## 2024-01-22 DIAGNOSIS — M47812 Spondylosis without myelopathy or radiculopathy, cervical region: Secondary | ICD-10-CM | POA: Diagnosis not present

## 2024-01-22 DIAGNOSIS — K5900 Constipation, unspecified: Secondary | ICD-10-CM | POA: Diagnosis present

## 2024-01-22 DIAGNOSIS — E871 Hypo-osmolality and hyponatremia: Secondary | ICD-10-CM | POA: Diagnosis not present

## 2024-01-22 DIAGNOSIS — Z818 Family history of other mental and behavioral disorders: Secondary | ICD-10-CM

## 2024-01-22 DIAGNOSIS — G893 Neoplasm related pain (acute) (chronic): Secondary | ICD-10-CM | POA: Diagnosis present

## 2024-01-22 DIAGNOSIS — C8370 Burkitt lymphoma, unspecified site: Secondary | ICD-10-CM | POA: Diagnosis present

## 2024-01-22 DIAGNOSIS — S064XAA Epidural hemorrhage with loss of consciousness status unknown, initial encounter: Secondary | ICD-10-CM

## 2024-01-22 DIAGNOSIS — R109 Unspecified abdominal pain: Secondary | ICD-10-CM | POA: Diagnosis not present

## 2024-01-22 DIAGNOSIS — Z888 Allergy status to other drugs, medicaments and biological substances status: Secondary | ICD-10-CM

## 2024-01-22 DIAGNOSIS — R531 Weakness: Secondary | ICD-10-CM

## 2024-01-22 DIAGNOSIS — E860 Dehydration: Secondary | ICD-10-CM | POA: Diagnosis not present

## 2024-01-22 DIAGNOSIS — R627 Adult failure to thrive: Secondary | ICD-10-CM | POA: Diagnosis present

## 2024-01-22 DIAGNOSIS — D72829 Elevated white blood cell count, unspecified: Secondary | ICD-10-CM | POA: Diagnosis present

## 2024-01-22 DIAGNOSIS — R197 Diarrhea, unspecified: Secondary | ICD-10-CM | POA: Diagnosis present

## 2024-01-22 DIAGNOSIS — F429 Obsessive-compulsive disorder, unspecified: Secondary | ICD-10-CM | POA: Diagnosis present

## 2024-01-22 DIAGNOSIS — R1909 Other intra-abdominal and pelvic swelling, mass and lump: Secondary | ICD-10-CM

## 2024-01-22 DIAGNOSIS — R1900 Intra-abdominal and pelvic swelling, mass and lump, unspecified site: Secondary | ICD-10-CM | POA: Diagnosis present

## 2024-01-22 DIAGNOSIS — Z9181 History of falling: Secondary | ICD-10-CM

## 2024-01-22 DIAGNOSIS — Z811 Family history of alcohol abuse and dependence: Secondary | ICD-10-CM

## 2024-01-22 DIAGNOSIS — E790 Hyperuricemia without signs of inflammatory arthritis and tophaceous disease: Secondary | ICD-10-CM | POA: Diagnosis present

## 2024-01-22 DIAGNOSIS — R350 Frequency of micturition: Secondary | ICD-10-CM | POA: Diagnosis present

## 2024-01-22 DIAGNOSIS — C8379 Burkitt lymphoma, extranodal and solid organ sites: Principal | ICD-10-CM | POA: Diagnosis present

## 2024-01-22 DIAGNOSIS — I10 Essential (primary) hypertension: Secondary | ICD-10-CM | POA: Diagnosis present

## 2024-01-22 DIAGNOSIS — Z79890 Hormone replacement therapy (postmenopausal): Secondary | ICD-10-CM

## 2024-01-22 DIAGNOSIS — D630 Anemia in neoplastic disease: Secondary | ICD-10-CM | POA: Diagnosis present

## 2024-01-22 DIAGNOSIS — S065XAA Traumatic subdural hemorrhage with loss of consciousness status unknown, initial encounter: Secondary | ICD-10-CM | POA: Diagnosis present

## 2024-01-22 DIAGNOSIS — H04123 Dry eye syndrome of bilateral lacrimal glands: Secondary | ICD-10-CM | POA: Diagnosis present

## 2024-01-22 DIAGNOSIS — Z66 Do not resuscitate: Secondary | ICD-10-CM | POA: Diagnosis present

## 2024-01-22 DIAGNOSIS — R188 Other ascites: Secondary | ICD-10-CM | POA: Diagnosis present

## 2024-01-22 DIAGNOSIS — C8590 Non-Hodgkin lymphoma, unspecified, unspecified site: Secondary | ICD-10-CM | POA: Diagnosis not present

## 2024-01-22 DIAGNOSIS — C786 Secondary malignant neoplasm of retroperitoneum and peritoneum: Secondary | ICD-10-CM | POA: Diagnosis present

## 2024-01-22 DIAGNOSIS — Z515 Encounter for palliative care: Secondary | ICD-10-CM

## 2024-01-22 DIAGNOSIS — R590 Localized enlarged lymph nodes: Secondary | ICD-10-CM | POA: Diagnosis not present

## 2024-01-22 DIAGNOSIS — E162 Hypoglycemia, unspecified: Secondary | ICD-10-CM | POA: Diagnosis not present

## 2024-01-22 DIAGNOSIS — S0635AA Traumatic hemorrhage of left cerebrum with loss of consciousness status unknown, initial encounter: Secondary | ICD-10-CM | POA: Diagnosis not present

## 2024-01-22 LAB — CBC
HCT: 38 % — ABNORMAL LOW (ref 39.0–52.0)
Hemoglobin: 13.2 g/dL (ref 13.0–17.0)
MCH: 32.6 pg (ref 26.0–34.0)
MCHC: 34.7 g/dL (ref 30.0–36.0)
MCV: 93.8 fL (ref 80.0–100.0)
Platelets: 436 K/uL — ABNORMAL HIGH (ref 150–400)
RBC: 4.05 MIL/uL — ABNORMAL LOW (ref 4.22–5.81)
RDW: 15.3 % (ref 11.5–15.5)
WBC: 31.3 K/uL — ABNORMAL HIGH (ref 4.0–10.5)
nRBC: 0.7 % — ABNORMAL HIGH (ref 0.0–0.2)

## 2024-01-22 LAB — COMPREHENSIVE METABOLIC PANEL WITH GFR
ALT: 18 U/L (ref 0–44)
AST: 83 U/L — ABNORMAL HIGH (ref 15–41)
Albumin: 4.2 g/dL (ref 3.5–5.0)
Alkaline Phosphatase: 47 U/L (ref 38–126)
Anion gap: 26 — ABNORMAL HIGH (ref 5–15)
BUN: 56 mg/dL — ABNORMAL HIGH (ref 8–23)
CO2: 17 mmol/L — ABNORMAL LOW (ref 22–32)
Calcium: 9.3 mg/dL (ref 8.9–10.3)
Chloride: 85 mmol/L — ABNORMAL LOW (ref 98–111)
Creatinine, Ser: 2.91 mg/dL — ABNORMAL HIGH (ref 0.61–1.24)
GFR, Estimated: 22 mL/min — ABNORMAL LOW
Glucose, Bld: 81 mg/dL (ref 70–99)
Potassium: 4.6 mmol/L (ref 3.5–5.1)
Sodium: 127 mmol/L — ABNORMAL LOW (ref 135–145)
Total Bilirubin: 0.4 mg/dL (ref 0.0–1.2)
Total Protein: 6.6 g/dL (ref 6.5–8.1)

## 2024-01-22 LAB — CK: Total CK: 224 U/L (ref 49–397)

## 2024-01-22 LAB — LIPASE, BLOOD: Lipase: 31 U/L (ref 11–51)

## 2024-01-22 MED ORDER — DEXTROSE-SODIUM CHLORIDE 5-0.9 % IV SOLN: INTRAVENOUS | Status: AC

## 2024-01-22 MED ORDER — KCL IN DEXTROSE-NACL 20-5-0.45 MEQ/L-%-% IV SOLN: INTRAVENOUS | Status: DC

## 2024-01-22 MED ORDER — LEVOTHYROXINE SODIUM 50 MCG PO TABS
50.0000 ug | ORAL_TABLET | Freq: Every day | ORAL | Status: DC
  Administered 2024-01-23 – 2024-01-27 (×5): 50 ug via ORAL
  Filled 2024-01-22 (×2): qty 1

## 2024-01-22 MED ORDER — ACETAMINOPHEN 650 MG RE SUPP: 650.0000 mg | Freq: Four times a day (QID) | RECTAL | Status: DC | PRN

## 2024-01-22 MED ORDER — ONDANSETRON HCL 4 MG/2ML IJ SOLN
4.0000 mg | Freq: Once | INTRAMUSCULAR | Status: AC
  Administered 2024-01-22: 4 mg via INTRAVENOUS
  Filled 2024-01-22: qty 2

## 2024-01-22 MED ORDER — MORPHINE SULFATE (PF) 4 MG/ML IV SOLN
4.0000 mg | Freq: Once | INTRAVENOUS | Status: AC
  Administered 2024-01-22: 4 mg via INTRAVENOUS
  Filled 2024-01-22: qty 1

## 2024-01-22 MED ORDER — ACETAMINOPHEN 325 MG PO TABS
650.0000 mg | ORAL_TABLET | Freq: Four times a day (QID) | ORAL | Status: DC | PRN
  Administered 2024-01-24 – 2024-01-25 (×3): 650 mg via ORAL
  Filled 2024-01-22: qty 2

## 2024-01-22 MED ORDER — HYDROMORPHONE HCL 1 MG/ML IJ SOLN
0.5000 mg | INTRAMUSCULAR | Status: DC | PRN
  Administered 2024-01-22 – 2024-01-23 (×2): 0.5 mg via INTRAVENOUS
  Filled 2024-01-22 (×2): qty 0.5

## 2024-01-22 MED ORDER — SODIUM CHLORIDE 0.9 % IV BOLUS
1000.0000 mL | Freq: Once | INTRAVENOUS | Status: AC
  Administered 2024-01-22: 1000 mL via INTRAVENOUS

## 2024-01-22 MED ORDER — SODIUM CHLORIDE 0.9 % IV BOLUS: 1000.0000 mL | Freq: Once | INTRAVENOUS | Status: DC

## 2024-01-22 NOTE — ED Provider Notes (Signed)
 " [place] EMERGENCY DEPARTMENT AT [facility] Provider Note   CSN: [id number] Arrival date & time: 01/22/24  1240     Patient presents with: Abdominal Pain   Douglas Bryant is a 73 y.o. male with history of diffuse large B-cell lymphoma presents with complaints of worsening abdominal pain with reduced p.o. intake, global weakness and falls.  Was seen for this at [facility] this past week and was found to have a new mass.  This was felt to be highly concerning for malignancy and compatible with peritoneal carcinomatosis/lymphomatosis with moderate volume ascites.  Found to have a AKI and UTI and was discharged on Keflex.  Patient continues to endorse nausea without vomiting or diarrhea.  States that he can hardly walk now due to generalized weakness.  Was brought in via EMS today.  Denies any lower extremity numbness or tingling.  He is status post lumbar fusion on 9/4.    Abdominal Pain  Past Medical History:  Diagnosis Date   Anemia    Aneurysm, aorta, thoracic    Anxiety    Back pain    Constipation    Depression    DLBCL (diffuse large B cell lymphoma) (HCC) 07/17/2015   Environmental allergies    History of blood transfusion    History of bronchitis    History of chemotherapy    Hypertension    Hypothyroidism    Marijuana use    Nocturia    Peripheral vascular disease    Urinary frequency    Past Surgical History:  Procedure Laterality Date   ANTERIOR AND POSTERIOR SPINAL FUSION N/A 10/01/2023   Procedure: EXTREME LATERAL INTERBODY FUSION OF LUMBAR THREE TO LUMBAR FOUR WITH POSTERIOR SUPPLEMENTAL FUSION INSTRAMENTED OF LUMBAR THREE TO LUMBAR FOUR;  Surgeon: Burnetta Aures, MD;  Location: [facility] OR;  Service: Orthopedics;  Laterality: N/A;  Extreme lateral interbody fusion L3-4,Posterior spinal fusion instrumentation L3-4   BONE MARROW BIOPSY     COLONOSCOPY N/A 08/12/2018   Procedure: COLONOSCOPY;  Surgeon: Golda Claudis PENNER, MD;  Location: [facility] ENDO SUITE;  Service:  Endoscopy;  Laterality: N/A;  1030   HERNIA REPAIR     umblical times 2; inguinal (left)    IR REMOVAL TUN ACCESS W/ PORT W/O FL MOD SED  07/08/2016   POLYPECTOMY  08/12/2018   Procedure: POLYPECTOMY;  Surgeon: Golda Claudis PENNER, MD;  Location: [facility] ENDO SUITE;  Service: Endoscopy;;  colon   port a cath placement     RHINOPLASTY         Prior to Admission medications  Medication Sig Start Date End Date Taking? Authorizing Provider  acetaminophen  (TYLENOL ) 500 MG tablet Take 1,000 mg by mouth every 6 (six) hours as needed for mild pain.    [provider]  albuterol (VENTOLIN HFA) 108 (90 Base) MCG/ACT inhaler Inhale 1-2 puffs into the lungs every 4 (four) hours as needed for wheezing or shortness of breath. 09/02/23   [provider]  bismuth subsalicylate (PEPTO BISMOL) 262 MG/15ML suspension Take 30 mLs by mouth every 6 (six) hours as needed for indigestion or diarrhea or loose stools.    [provider]  Ca Carbonate-Mag Hydroxide (ROLAIDS PO) Take 2 tablets by mouth daily as needed (heartburn).    [provider]  Carboxymethylcellul-Glycerin (LUBRICATING EYE DROPS OP) Place 1 drop into both eyes daily as needed (dry eyes).    [provider]  chlorpheniramine (CHLOR-TRIMETON) 4 MG tablet Take 4 mg by mouth every 4 (four) hours as  needed for allergies.    [provider]  diphenhydrAMINE (BENADRYL) 25 mg capsule Take 25 mg by mouth daily.    [provider]  famotidine (PEPCID) 20 MG tablet Take 20 mg by mouth 2 (two) times daily as needed for heartburn or indigestion.    [provider]  fluticasone (FLONASE) 50 MCG/ACT nasal spray Place 1 spray into both nostrils daily as needed for allergies or rhinitis.    [provider]  hydrochlorothiazide  (HYDRODIURIL ) 25 MG tablet Take 1 tablet (25 mg total) by mouth daily. 08/07/21   Barrett, Erin R, PA-C  levothyroxine  (SYNTHROID ) 50 MCG tablet Take 50 mcg by mouth daily.  01/13/23   [provider]  LORazepam  (ATIVAN ) 0.5 MG tablet Take by mouth. 11/18/23   [provider]  metoprolol  succinate (TOPROL  XL) 25 MG 24 hr tablet Take 1 tablet (25 mg total) by mouth daily. Take with or immediately following a meal. 08/07/21 11/26/23  Barrett, Erin R, PA-C  ondansetron  (ZOFRAN ) 4 MG tablet Take 1 tablet (4 mg total) by mouth every 8 (eight) hours as needed for nausea or vomiting. 10/01/23   Burnetta Aures, MD  oxymetazoline  (AFRIN) 0.05 % nasal spray Place 1-2 sprays into both nostrils 2 (two) times daily as needed for congestion.    [provider]  senna-docusate (SENOKOT-S) 8.6-50 MG tablet Take 1 tablet by mouth 2 (two) times daily. 10/03/23   Kit Rush, MD  sertraline  (ZOLOFT ) 50 MG tablet Take 1 tablet (50 mg total) by mouth daily. Patient taking differently: Take 50 mg by mouth daily as needed (Anxiety). 03/04/23   Plovsky, Elna, MD  valACYclovir (VALTREX) 500 MG tablet Take 500 mg by mouth daily as needed (Herpes Virus).    [provider]  zolpidem  (AMBIEN ) 10 MG tablet Take 1 tablet (10 mg total) by mouth at bedtime. 07/22/23   Tasia Elna, MD    Allergies: Flomax [tamsulosin hcl]    Review of Systems  Gastrointestinal:  Positive for abdominal pain.    Updated Vital Signs BP 135/80 (BP Location: Left Arm)   Pulse 88   Temp 98 F (36.7 C) (Oral)   Resp 18   Ht 6' (1.829 m)   Wt 83.5 kg   SpO2 94%   BMI 24.97 kg/m   Physical Exam Vitals and nursing note reviewed.  Constitutional:      General: He is not in acute distress.    Appearance: He is well-developed.  HENT:     Head: Normocephalic and atraumatic.  Eyes:     Conjunctiva/sclera: Conjunctivae normal.  Cardiovascular:     Rate and Rhythm: Normal rate and regular rhythm.     Heart sounds: No murmur heard. Pulmonary:     Effort: Pulmonary effort is normal. No respiratory distress.     Breath sounds: Normal breath sounds.  Abdominal:     Comments:  Abdomen is distended, no erythema, warmth, no significant focal tenderness  Musculoskeletal:        General: No swelling.     Cervical back: Neck supple.     Comments: Unable to ambulate due to gross weakness, PT and radial pulses 2+, compartments are soft, no focal tenderness, no swelling or ecchymosis tolerates full range of motion of extremities without any focal tenderness  Skin:    General: Skin is warm and dry.     Capillary Refill: Capillary refill takes less than 2 seconds.  Neurological:     Mental Status: He is alert.  Comments: Patient is alert and oriented. There is no abnormal phonation. Symmetric smile without facial droop. No pronator drift. Moves all extremities spontaneously.. . No sensation deficit. There is no nystagmus. EOMI, PERRL. Coordination intact with finger to nose    Psychiatric:        Mood and Affect: Mood normal.     (all labs ordered are listed, but only abnormal results are displayed) Labs Reviewed  COMPREHENSIVE METABOLIC PANEL WITH GFR - Abnormal; Notable for the following components:      Result Value   Sodium 127 (*)    Chloride 85 (*)    CO2 17 (*)    BUN 56 (*)    Creatinine, Ser 2.91 (*)    AST 83 (*)    GFR, Estimated 22 (*)    Anion gap 26 (*)    All other components within normal limits  CBC - Abnormal; Notable for the following components:   WBC 31.3 (*)    RBC 4.05 (*)    HCT 38.0 (*)    Platelets 436 (*)    nRBC 0.7 (*)    All other components within normal limits  LIPASE, BLOOD  CK  URINALYSIS, ROUTINE W REFLEX MICROSCOPIC  COMPREHENSIVE METABOLIC PANEL WITH GFR  CBC  MAGNESIUM   PHOSPHORUS  MAGNESIUM     EKG: None  Radiology:    .Critical Care  Performed by: Donnajean Lynwood DEL, PA-C Authorized by: Donnajean Lynwood DEL, PA-C   Critical care provider statement:    Critical care time (minutes):  30   Critical care was time spent personally by me on the following activities:  Development of treatment plan with  patient or surrogate, discussions with consultants, evaluation of patient's response to treatment, examination of patient, ordering and review of laboratory studies, ordering and review of radiographic studies, ordering and performing treatments and interventions, pulse oximetry, re-evaluation of patient's condition and review of old charts    Medications Ordered in the ED  sodium chloride  0.9 % bolus 1,000 mL (has no administration in time range)  acetaminophen  (TYLENOL ) tablet 650 mg (has no administration in time range)    Or  acetaminophen  (TYLENOL ) suppository 650 mg (has no administration in time range)  dextrose  5 %-0.9 % sodium chloride  infusion (has no administration in time range)  HYDROmorphone  (DILAUDID ) injection 0.5 mg (has no administration in time range)  levothyroxine  (SYNTHROID ) tablet 50 mcg (has no administration in time range)  sodium chloride  0.9 % bolus 1,000 mL (1,000 mLs Intravenous New Bag/Given 01/22/24 1447)  morphine  (PF) 4 MG/ML injection 4 mg (4 mg Intravenous Given 01/22/24 1439)  ondansetron  (ZOFRAN ) injection 4 mg (4 mg Intravenous Given 01/22/24 1440)    Clinical Course as of 01/22/24 2314  Fri Jan 22, 2024  1838 Patient with history of diffuse large B-cell lymphoma evaluated for worsening abdominal pain and generalized weakness and falls over the past few weeks.  Upon evaluation patient has generalized weakness.  He is hemodynamically stable and nontoxic-appearing.  Was seen at [facility] earlier this week.  Does not appear that any CT head imaging was obtained.  Will add that on today. [JT]  1838 CT Head Wo Contrast Acute left 7 mm extra-axial hemorrhage probably epidural, with mild mass effect no midline shift  Neurosurgery consulted [JT]  1906 Discussed patient with Dr. Louis, recommended repeat CT in 6 hours, if no change admit to hospitalist service.  If any change, reach back out to neurosurgery and transfer to [facility] [JT]  2006 Discussed patient with  Dr.  Adefeso, agreed for admission.  CT notified to repeat scan in 6 hours [JT]    Clinical Course User Index [JT] Donnajean Lynwood DEL, PA-C                                 Medical Decision Making Amount and/or Complexity of Data Reviewed Labs: ordered. Radiology: ordered. Decision-making details documented in ED Course.  Risk Prescription drug management. Decision regarding hospitalization.   This patient presents to the ED with chief complaint(s) of weakness .  The complaint involves an extensive differential diagnosis and also carries with it a high risk of complications and morbidity.   Pertinent past medical history as listed in HPI  The differential diagnosis includes  Intracranial hemorrhage, malignancy, CVA, TIA, AKI, SBP Additional history obtained: Additional history obtained from family Records reviewed Care Everywhere/External Records  Disposition:   Patient mated for further workup and management  Social Determinants of Health:   none  This note was dictated with voice recognition software.  Despite best efforts at proofreading, errors may have occurred which can change the documentation meaning.       Final diagnoses:  Epidural hemorrhage without loss of consciousness, initial encounter [facility])  Abdominal mass of other site  Weakness    ED Discharge Orders     None          Donnajean Lynwood DEL DEVONNA 01/22/24 2314    Bernard Drivers, MD 01/23/24 1449  "

## 2024-01-22 NOTE — ED Triage Notes (Signed)
 Pt arrived via [facility]. Pt was recently seen at [facility] and they stated that he has an abd mass. Pt has knee pain and left foot swelling as well. [facility] doesn't know if the mass is cancerous or not and pt has an apt for the 31 of dec and pt states he just can't wait that long. Generalized distention and hardness noted on abdomen.

## 2024-01-22 NOTE — H&P (Addendum)
 " History and Physical    Patient: Douglas Bryant FMW:[id number] DOB: 12-03-1950 DOA: 01/22/2024 DOS: the patient was seen and examined on 01/22/2024 PCP: Maree Isles, MD  Patient coming from: Home  Chief Complaint:  Chief Complaint  Patient presents with   Abdominal Pain   HPI: Douglas Bryant is a 73 y.o. male with medical history significant of hypertension, hypothyroidism, diffuse large B-cell lymphoma who presents to the emergency department due to several weeks of worsening abdominal pain with associated decreased oral intake and generalized weakness including 2  falls prior to presenting to [facility] ED on 01/18/2024 with a new abdominal mass which was concerning for malignancy and compatible with peritoneal carcinomatosis/lymphoma with moderate volume ascites.  He was diagnosed with acute kidney injury and UTI and discharged with Keflex.  He complained of worsening weakness with difficulty in being able to ambulate with a walker and complain of nausea without vomiting.  He has an appointment with oncologist on 12/31, but patient does not think he can wait till 31st, so he presents to the ED for further evaluation.  ED course In the emergency department, he was hemodynamically stable.  Workup in ED showed leukocytosis 12.3, hemoglobin 13.2, hematocrit 30.0, MCV 93.3, platelets 436.  BMP showed sodium 127, potassium 4.6, chloride 85, bicarb 17, blood glucose 81, BUN 56, creatinine 2.91 (baseline creatinine at 0.7-0.8). CT abdomen and pelvis showed large infiltrative right paraduodenal space mass (at least 6.4 x 14.6 cm). Extensive omental caking and peritoneal thickening consistent with peritoneal carcinomatosis, with  large volume ascites. IV morphine  4 mg x 1 was given, Zofran  was given.  IV hydration was provided.  [facility] was asked admit patient.   Review of Systems: As mentioned in the history of present illness. All other systems reviewed and are negative. Past Medical History:   Diagnosis Date   Anemia    Aneurysm, aorta, thoracic    Anxiety    Back pain    Constipation    Depression    DLBCL (diffuse large B cell lymphoma) (HCC) 07/17/2015   Environmental allergies    History of blood transfusion    History of bronchitis    History of chemotherapy    Hypertension    Hypothyroidism    Marijuana use    Nocturia    Peripheral vascular disease    Urinary frequency    Past Surgical History:  Procedure Laterality Date   ANTERIOR AND POSTERIOR SPINAL FUSION N/A 10/01/2023   Procedure: EXTREME LATERAL INTERBODY FUSION OF LUMBAR THREE TO LUMBAR FOUR WITH POSTERIOR SUPPLEMENTAL FUSION INSTRAMENTED OF LUMBAR THREE TO LUMBAR FOUR;  Surgeon: Burnetta Aures, MD;  Location: [facility] OR;  Service: Orthopedics;  Laterality: N/A;  Extreme lateral interbody fusion L3-4,Posterior spinal fusion instrumentation L3-4   BONE MARROW BIOPSY     COLONOSCOPY N/A 08/12/2018   Procedure: COLONOSCOPY;  Surgeon: Golda Claudis PENNER, MD;  Location: [facility] ENDO SUITE;  Service: Endoscopy;  Laterality: N/A;  1030   HERNIA REPAIR     umblical times 2; inguinal (left)    IR REMOVAL TUN ACCESS W/ PORT W/O FL MOD SED  07/08/2016   POLYPECTOMY  08/12/2018   Procedure: POLYPECTOMY;  Surgeon: Golda Claudis PENNER, MD;  Location: [facility] ENDO SUITE;  Service: Endoscopy;;  colon   port a cath placement     RHINOPLASTY     Social History:  reports that he has never smoked. He has never used smokeless tobacco. He reports current alcohol  use of about 6.0 standard drinks  of alcohol  per week. He reports current drug use. Frequency: 2.00 times per week. Drug: Marijuana.  Allergies[1]  Family History  Problem Relation Age of Onset   Alcohol  abuse Father    Depression Sister    Anxiety disorder Sister    Depression Sister    Anxiety disorder Sister     Prior to Admission medications  Medication Sig Start Date End Date Taking? Authorizing Provider  acetaminophen  (TYLENOL ) 500 MG tablet Take 1,000 mg by mouth every 6  (six) hours as needed for mild pain.    [provider]  albuterol (VENTOLIN HFA) 108 (90 Base) MCG/ACT inhaler Inhale 1-2 puffs into the lungs every 4 (four) hours as needed for wheezing or shortness of breath. 09/02/23   [provider]  bismuth subsalicylate (PEPTO BISMOL) 262 MG/15ML suspension Take 30 mLs by mouth every 6 (six) hours as needed for indigestion or diarrhea or loose stools.    [provider]  Ca Carbonate-Mag Hydroxide (ROLAIDS PO) Take 2 tablets by mouth daily as needed (heartburn).    [provider]  Carboxymethylcellul-Glycerin (LUBRICATING EYE DROPS OP) Place 1 drop into both eyes daily as needed (dry eyes).    [provider]  chlorpheniramine (CHLOR-TRIMETON) 4 MG tablet Take 4 mg by mouth every 4 (four) hours as needed for allergies.    [provider]  diphenhydrAMINE (BENADRYL) 25 mg capsule Take 25 mg by mouth daily.    [provider]  famotidine (PEPCID) 20 MG tablet Take 20 mg by mouth 2 (two) times daily as needed for heartburn or indigestion.    [provider]  fluticasone (FLONASE) 50 MCG/ACT nasal spray Place 1 spray into both nostrils daily as needed for allergies or rhinitis.    [provider]  hydrochlorothiazide  (HYDRODIURIL ) 25 MG tablet Take 1 tablet (25 mg total) by mouth daily. 08/07/21   Barrett, Erin R, PA-C  levothyroxine  (SYNTHROID ) 50 MCG tablet Take 50 mcg by mouth daily. 01/13/23   [provider]  LORazepam  (ATIVAN ) 0.5 MG tablet Take by mouth. 11/18/23   [provider]  metoprolol  succinate (TOPROL  XL) 25 MG 24 hr tablet Take 1 tablet (25 mg total) by mouth daily. Take with or immediately following a meal. 08/07/21 11/26/23  Barrett, Erin R, PA-C  ondansetron  (ZOFRAN ) 4 MG tablet Take 1 tablet (4 mg total) by mouth every 8 (eight) hours as needed for nausea or vomiting. 10/01/23   Burnetta Aures, MD  oxymetazoline  (AFRIN) 0.05 % nasal spray Place 1-2  sprays into both nostrils 2 (two) times daily as needed for congestion.    [provider]  senna-docusate (SENOKOT-S) 8.6-50 MG tablet Take 1 tablet by mouth 2 (two) times daily. 10/03/23   Kit Rush, MD  sertraline  (ZOLOFT ) 50 MG tablet Take 1 tablet (50 mg total) by mouth daily. Patient taking differently: Take 50 mg by mouth daily as needed (Anxiety). 03/04/23   Plovsky, Elna, MD  valACYclovir (VALTREX) 500 MG tablet Take 500 mg by mouth daily as needed (Herpes Virus).    [provider]  zolpidem  (AMBIEN ) 10 MG tablet Take 1 tablet (10 mg total) by mouth at bedtime. 07/22/23   Tasia Elna, MD    Physical Exam: Vitals:   01/22/24 1249 01/22/24 1250 01/22/24 1859  BP: (!) 144/89  124/81  Pulse: 94  100  Resp: 18  16  Temp: 98.1 F (36.7 C)  97.8 F (36.6 C)  TempSrc: Oral  Oral  SpO2: 95%  92%  Weight:  83.5 kg   Height:  6' 02-17-24 m)    General: Elderly male. Awake and alert and oriented x3. Not in any acute distress.  HEENT: NCAT.  PERRLA. EOMI. Sclerae anicteric.  Dry mucosal membranes. Neck: Neck supple without lymphadenopathy. No carotid bruits. No masses palpated.  Cardiovascular: Regular rate with normal S1-S2 sounds. No murmurs, rubs or gallops auscultated. No JVD.  Respiratory: Clear breath sounds.  No accessory muscle use. Abdomen: Soft, nontender, distended. Active bowel sounds.   Skin: No rashes, lesions, or ulcerations.  Dry, warm to touch. Musculoskeletal:  +2 lower extremity edema noted hematoma on dorsal surface of  2nd - 4th toes of right foot (from fall sustained about 2 weeks ago).  2+ dorsalis pedis and radial pulses. Good ROM.   Psychiatric: Intact judgment and insight.  Mood appropriate to current condition. Neurologic: No focal neurological deficits. Strength is 5/5 x 4.  CN II - XII grossly intact.  Assessment and Plan: Epidural hematoma CT of head without contrast showed acute left zone millimeter extra-axial MRH-probably epidural  but possibly subdural, with mild mass effect and no midline shift Neurosurgery (Dr. Louis) was consulted and recommended a repeat CT 6 hours after the first CT of head and to call back for worsening hematoma, but if CT remains same, patient may be observed at [facility] and can follow-up as an outpatient per  ED PA  Abdominal mass CT abdomen and pelvis showed large infiltrative right paraduodenal space mass (at least 6.4 x 14.6 cm).  This was suggested to be lymphoma (patient has a history of diffuse large B-cell lymphoma in remission) with less likely differential being primary colonic malignancy or primary pancreatic malignancy This was 1st detected in the CT of abdomen done during ED visit to [facility] ER on 12/22 Patient states that he had an appointment with an oncologist in [place] on 12/31, but due to worsening weakness and decreased oral intake, he was not sure he can wait till 12/31, so he decided to go to the ED for further evaluation Continue IV Dilaudid  0.5 mg every 4 hours as needed for moderate/severe pain Oncologist will be consulted and we shall await further recommendations  Acute kidney injury Dehydration Creatinine 2.91 (baseline creatinine at 0.7-0.8) IV hydration was provided Renally adjust medications, avoid nephrotoxic agents/dehydration/hypotension  Starvation ketoacidosis Bicarb 17, anion gap 26 Patient has been having decreased oral intake IV hydration with NS was provided; Continue D5- NS and monitor for anion gap closure and improved bicarb  Hyponatremia Na 125; this is possibly due to dehydration and diuretic effect Continue IV hydration and continue to monitor sodium levels HCTZ will be held at this time  Leukocytosis possibly due to a reactive process WBC 31.3, continue to monitor WBC with morning labs  Acquired hypothyroidism Continue Synthroid   Essential hypertension (controlled) HCTZ will be held at this time due to hyponatremia   Advance Care Planning: Full  code  Consults: Oncology  Family Communication: Sister at bedside  Severity of Illness: The appropriate patient status for this patient is INPATIENT. Inpatient status is judged to be reasonable and necessary in order to provide the required intensity of service to ensure the patient's safety. The patient's presenting symptoms, physical exam findings, and initial radiographic and laboratory data in the context of their chronic comorbidities is felt to place them at high risk for further clinical deterioration. Furthermore, it is not anticipated that the patient will be medically stable for discharge from the hospital within 2 midnights of admission.   *  I certify that at the point of admission it is my clinical judgment that the patient will require inpatient hospital care spanning beyond 2 midnights from the point of admission due to high intensity of service, high risk for further deterioration and high frequency of surveillance required.*  Author: Willistine Ferrall, DO 01/22/2024 8:07 PM  For on call review [email or web address].      [1]  Allergies Allergen Reactions   Flomax [Tamsulosin Hcl] Swelling    congestion    "

## 2024-01-22 NOTE — ED Notes (Signed)
 Attempted to stand pt x2 staff to use urinal Pt was unable to bear any weight on his legs, and was anxious about falling

## 2024-01-22 NOTE — Plan of Care (Signed)
  Problem: Education: Goal: Knowledge of General Education information will improve Description: Including pain rating scale, medication(s)/side effects and non-pharmacologic comfort measures Outcome: Progressing   Problem: Clinical Measurements: Goal: Ability to maintain clinical measurements within normal limits will improve Outcome: Progressing Goal: Will remain free from infection Outcome: Progressing Goal: Diagnostic test results will improve Outcome: Progressing Goal: Respiratory complications will improve Outcome: Progressing Goal: Cardiovascular complication will be avoided Outcome: Progressing   Problem: Coping: Goal: Level of anxiety will decrease Outcome: Progressing   Problem: Elimination: Goal: Will not experience complications related to bowel motility Outcome: Progressing Goal: Will not experience complications related to urinary retention Outcome: Progressing   Problem: Pain Managment: Goal: General experience of comfort will improve and/or be controlled Outcome: Progressing   Problem: Safety: Goal: Ability to remain free from injury will improve Outcome: Progressing   Problem: Skin Integrity: Goal: Risk for impaired skin integrity will decrease Outcome: Progressing

## 2024-01-22 NOTE — ED Notes (Signed)
 Admitting MD at bedside.

## 2024-01-22 NOTE — ED Notes (Signed)
 Hospitalist at bedside

## 2024-01-23 DIAGNOSIS — I619 Nontraumatic intracerebral hemorrhage, unspecified: Secondary | ICD-10-CM | POA: Diagnosis not present

## 2024-01-23 DIAGNOSIS — S065XAA Traumatic subdural hemorrhage with loss of consciousness status unknown, initial encounter: Secondary | ICD-10-CM

## 2024-01-23 LAB — PHOSPHORUS: Phosphorus: 4 mg/dL (ref 2.5–4.6)

## 2024-01-23 LAB — CBC
HCT: 35.4 % — ABNORMAL LOW (ref 39.0–52.0)
Hemoglobin: 11.9 g/dL — ABNORMAL LOW (ref 13.0–17.0)
MCH: 32.7 pg (ref 26.0–34.0)
MCHC: 33.6 g/dL (ref 30.0–36.0)
MCV: 97.3 fL (ref 80.0–100.0)
Platelets: 355 K/uL (ref 150–400)
RBC: 3.64 MIL/uL — ABNORMAL LOW (ref 4.22–5.81)
RDW: 15.7 % — ABNORMAL HIGH (ref 11.5–15.5)
WBC: 32.5 K/uL — ABNORMAL HIGH (ref 4.0–10.5)
nRBC: 0.8 % — ABNORMAL HIGH (ref 0.0–0.2)

## 2024-01-23 LAB — URIC ACID: Uric Acid, Serum: >21 mg/dL — ABNORMAL HIGH (ref 3.7–8.6)

## 2024-01-23 LAB — COMPREHENSIVE METABOLIC PANEL WITH GFR
ALT: 18 U/L (ref 0–44)
AST: 75 U/L — ABNORMAL HIGH (ref 15–41)
Albumin: 3.9 g/dL (ref 3.5–5.0)
Alkaline Phosphatase: 51 U/L (ref 38–126)
Anion gap: 19 — ABNORMAL HIGH (ref 5–15)
BUN: 59 mg/dL — ABNORMAL HIGH (ref 8–23)
CO2: 23 mmol/L (ref 22–32)
Calcium: 9.1 mg/dL (ref 8.9–10.3)
Chloride: 88 mmol/L — ABNORMAL LOW (ref 98–111)
Creatinine, Ser: 3 mg/dL — ABNORMAL HIGH (ref 0.61–1.24)
GFR, Estimated: 21 mL/min — ABNORMAL LOW
Glucose, Bld: 71 mg/dL (ref 70–99)
Potassium: 4.6 mmol/L (ref 3.5–5.1)
Sodium: 131 mmol/L — ABNORMAL LOW (ref 135–145)
Total Bilirubin: 0.3 mg/dL (ref 0.0–1.2)
Total Protein: 6 g/dL — ABNORMAL LOW (ref 6.5–8.1)

## 2024-01-23 LAB — MAGNESIUM: Magnesium: 2.6 mg/dL — ABNORMAL HIGH (ref 1.7–2.4)

## 2024-01-23 LAB — LACTATE DEHYDROGENASE: LDH: >2500 U/L — ABNORMAL HIGH (ref 105–235)

## 2024-01-23 MED ORDER — HYDROMORPHONE HCL 1 MG/ML IJ SOLN
0.5000 mg | INTRAMUSCULAR | Status: DC | PRN
  Administered 2024-01-23 – 2024-01-25 (×10): 0.5 mg via INTRAVENOUS
  Filled 2024-01-23 (×9): qty 0.5

## 2024-01-23 MED ORDER — POLYETHYLENE GLYCOL 3350 17 G PO PACK
17.0000 g | PACK | Freq: Every day | ORAL | Status: DC
  Administered 2024-01-23 – 2024-01-27 (×5): 17 g via ORAL
  Filled 2024-01-23 (×2): qty 1

## 2024-01-23 MED ORDER — DEXTROSE-SODIUM CHLORIDE 5-0.9 % IV SOLN: INTRAVENOUS | Status: AC

## 2024-01-23 NOTE — Plan of Care (Signed)
" °  Problem: Education: Goal: Knowledge of General Education information will improve Description: Including pain rating scale, medication(s)/side effects and non-pharmacologic comfort measures Outcome: Not Progressing   Problem: Health Behavior/Discharge Planning: Goal: Ability to manage health-related needs will improve Outcome: Not Progressing   Problem: Clinical Measurements: Goal: Ability to maintain clinical measurements within normal limits will improve Outcome: Not Progressing Goal: Will remain free from infection Outcome: Progressing Goal: Diagnostic test results will improve Outcome: Not Progressing Goal: Respiratory complications will improve Outcome: Progressing Goal: Cardiovascular complication will be avoided Outcome: Progressing   Problem: Activity: Goal: Risk for activity intolerance will decrease Outcome: Not Progressing   Problem: Nutrition: Goal: Adequate nutrition will be maintained Outcome: Progressing   Problem: Coping: Goal: Level of anxiety will decrease Outcome: Progressing   Problem: Elimination: Goal: Will not experience complications related to bowel motility Outcome: Progressing Goal: Will not experience complications related to urinary retention Outcome: Progressing   Problem: Pain Managment: Goal: General experience of comfort will improve and/or be controlled Outcome: Progressing   Problem: Safety: Goal: Ability to remain free from injury will improve Outcome: Progressing   Problem: Skin Integrity: Goal: Risk for impaired skin integrity will decrease Outcome: Progressing   "

## 2024-01-23 NOTE — Progress Notes (Signed)
 " PROGRESS NOTE    Douglas Bryant  FMW:[id number] DOB: 15-Feb-1950 DOA: 01/22/2024 PCP: Maree Isles, MD   Brief Narrative:   Douglas Bryant is a 73 y.o. male with medical history significant of hypertension, hypothyroidism, diffuse large B-cell lymphoma who presents to the emergency department due to several weeks of worsening abdominal pain with associated decreased oral intake and generalized weakness including 2  falls prior to presenting to [facility] ED on 01/18/2024 with a new abdominal mass which was concerning for malignancy and compatible with peritoneal carcinomatosis/lymphoma with moderate volume ascites.  He was diagnosed with acute kidney injury and UTI and discharged with Keflex.  He complained of worsening weakness with difficulty in being able to ambulate with a walker and complain of nausea without vomiting.  He has an appointment with oncologist on 12/31, but patient does not think he can wait till 31st, so he presents to the ED for further evaluation.  He continues to have dehydration with AKI and remains on IV fluid.  CT of the head appears stable as he has noted to have epidural hematoma and there is no significant confusion or neurologic deficits.  Assessment & Plan:   Principal Problem:   Subdural hematoma (HCC)  Assessment and Plan:   Epidural hematoma CT of head without contrast showed acute left zone millimeter extra-axial MRH-probably epidural but possibly subdural, with mild mass effect and no midline shift Neurosurgery (Dr. Louis) was consulted and recommended a repeat CT 6 hours after the first CT of head and to call back for worsening hematoma, but if CT remains same, patient may be observed at [facility] and can follow-up as an outpatient per  ED PA - Repeat CT head appears stable on repeat scan   Abdominal mass favoring lymphoma CT abdomen and pelvis showed large infiltrative right paraduodenal space mass (at least 6.4 x 14.6 cm).  This was suggested to be lymphoma  (patient has a history of diffuse large B-cell lymphoma in remission) with less likely differential being primary colonic malignancy or primary pancreatic malignancy This was 1st detected in the CT of abdomen done during ED visit to [facility] ER on 12/22 Patient states that he had an appointment with an oncologist in [place] on 12/31, but due to worsening weakness and decreased oral intake, he was not sure he can wait till 12/31, so he decided to go to the ED for further evaluation Continue IV Dilaudid  0.5 mg every 3 hours as needed for moderate/severe pain Oncology recommending differential with CBC, LDH, uric acid and flow cytometry along with IR to biopsy mass.  Paracentesis on Monday with cytology Palliative consultation   Acute kidney injury Dehydration Creatinine 2.91 (baseline creatinine at 0.7-0.8) IV hydration was provided Renally adjust medications, avoid nephrotoxic agents/dehydration/hypotension   Starvation ketoacidosis Bicarb 17, anion gap 26 Patient has been having decreased oral intake IV hydration with NS was provided; Continue D5- NS and monitor for anion gap closure and improved bicarb   Hyponatremia-improving Na 125; this is possibly due to dehydration and diuretic effect Continue IV hydration and continue to monitor sodium levels HCTZ will be held at this time   Leukocytosis possibly due to a reactive process Continue to monitor   Acquired hypothyroidism Continue Synthroid    Essential hypertension (controlled) HCTZ will be held at this time due to hyponatremia   DVT prophylaxis: SCDs Code Status: DNR Family Communication: Brother-in-law at bedside 12/27 Disposition Plan:  Status is: Inpatient Remains inpatient appropriate because: Need for IV medications  Consultants:  EDP discussed with neurosurgery Oncology IR  Procedures:  None  Antimicrobials:  None   Subjective: Patient seen and evaluated today with ongoing severe pain and is requesting  further Dilaudid  for pain control.  He is agreeable to DO NOT RESUSCITATE status and brother-in-law is at bedside.  Objective: Vitals:   01/22/24 2051 01/22/24 2054 01/23/24 0032 01/23/24 0434  BP:  135/80 122/75 (!) 153/86  Pulse:  88 83 94  Resp:  18 18 18   Temp:  98 F (36.7 C) 98.3 F (36.8 C) 98.3 F (36.8 C)  TempSrc:  Oral Oral Oral  SpO2: 94% 94% 93% 95%  Weight:  86.1 kg    Height:  6' 1 (1.854 m)      Intake/Output Summary (Last 24 hours) at 01/23/2024 0805 Last data filed at 01/23/2024 0400 Gross per 24 hour  Intake 370 ml  Output 350 ml  Net 20 ml   Filed Weights   01/22/24 1250 01/22/24 2054  Weight: 83.5 kg 86.1 kg    Examination:  General exam: Appears calm and comfortable  Respiratory system: Clear to auscultation. Respiratory effort normal. Cardiovascular system: S1 & S2 heard, RRR.  Gastrointestinal system: Abdomen is soft, distended Central nervous system: Alert and awake Extremities: No edema Skin: No significant lesions noted Psychiatry: Flat affect.    Data Reviewed: I have personally reviewed following labs and imaging studies  CBC: Recent Labs  Lab 01/22/24 1252 01/23/24 0602  WBC 31.3* 32.5*  HGB 13.2 11.9*  HCT 38.0* 35.4*  MCV 93.8 97.3  PLT 436* 355   Basic Metabolic Panel: Recent Labs  Lab 01/22/24 1252 01/23/24 0602  NA 127* 131*  K 4.6 4.6  CL 85* 88*  CO2 17* 23  GLUCOSE 81 71  BUN 56* 59*  CREATININE 2.91* 3.00*  CALCIUM  9.3 9.1  MG  --  2.6*  PHOS  --  4.0   GFR: Estimated Creatinine Clearance: 24 mL/min (A) (by C-G formula based on SCr of 3 mg/dL (H)). Liver Function Tests: Recent Labs  Lab 01/22/24 1252 01/23/24 0602  AST 83* 75*  ALT 18 18  ALKPHOS 47 51  BILITOT 0.4 0.3  PROT 6.6 6.0*  ALBUMIN  4.2 3.9   Recent Labs  Lab 01/22/24 1252  LIPASE 31   No results for input(s): AMMONIA in the last 168 hours. Coagulation Profile: No results for input(s): INR, PROTIME in the last 168  hours. Cardiac Enzymes: Recent Labs  Lab 01/22/24 1252  CKTOTAL 224   BNP (last 3 results) No results for input(s): PROBNP in the last 8760 hours. HbA1C: No results for input(s): HGBA1C in the last 72 hours. CBG: No results for input(s): GLUCAP in the last 168 hours. Lipid Profile: No results for input(s): CHOL, HDL, LDLCALC, TRIG, CHOLHDL, LDLDIRECT in the last 72 hours. Thyroid  Function Tests: No results for input(s): TSH, T4TOTAL, FREET4, T3FREE, THYROIDAB in the last 72 hours. Anemia Panel: No results for input(s): VITAMINB12, FOLATE, FERRITIN, TIBC, IRON, RETICCTPCT in the last 72 hours. Sepsis Labs: No results for input(s): PROCALCITON, LATICACIDVEN in the last 168 hours.  No results found for this or any previous visit (from the past 240 hours).       Scheduled Meds:  levothyroxine   50 mcg Oral Q0600   Continuous Infusions:  dextrose  5 % and 0.9 % NaCl 50 mL/hr at 01/22/24 2331   sodium chloride        LOS: 1 day    Time spent: 55 minutes  Jakira Mcfadden JONETTA Fairly, DO [facility]  If 7PM-7AM, please contact night-coverage [email or web address] 01/23/2024, 8:05 AM   "

## 2024-01-24 DIAGNOSIS — S065XAA Traumatic subdural hemorrhage with loss of consciousness status unknown, initial encounter: Secondary | ICD-10-CM | POA: Diagnosis not present

## 2024-01-24 LAB — COMPREHENSIVE METABOLIC PANEL WITH GFR
ALT: 17 U/L (ref 0–44)
AST: 75 U/L — ABNORMAL HIGH (ref 15–41)
Albumin: 3.9 g/dL (ref 3.5–5.0)
Alkaline Phosphatase: 46 U/L (ref 38–126)
Anion gap: 20 — ABNORMAL HIGH (ref 5–15)
BUN: 59 mg/dL — ABNORMAL HIGH (ref 8–23)
CO2: 21 mmol/L — ABNORMAL LOW (ref 22–32)
Calcium: 8.9 mg/dL (ref 8.9–10.3)
Chloride: 92 mmol/L — ABNORMAL LOW (ref 98–111)
Creatinine, Ser: 2.68 mg/dL — ABNORMAL HIGH (ref 0.61–1.24)
GFR, Estimated: 24 mL/min — ABNORMAL LOW
Glucose, Bld: 68 mg/dL — ABNORMAL LOW (ref 70–99)
Potassium: 3.9 mmol/L (ref 3.5–5.1)
Sodium: 133 mmol/L — ABNORMAL LOW (ref 135–145)
Total Bilirubin: 0.4 mg/dL (ref 0.0–1.2)
Total Protein: 6 g/dL — ABNORMAL LOW (ref 6.5–8.1)

## 2024-01-24 LAB — CBC WITH DIFFERENTIAL/PLATELET
Abs Immature Granulocytes: 3.5 K/uL — ABNORMAL HIGH (ref 0.00–0.07)
Abs Immature Granulocytes: 2.7 K/uL — ABNORMAL HIGH (ref 0.00–0.07)
Band Neutrophils: 7 %
Basophils Absolute: 0 K/uL (ref 0.0–0.1)
Basophils Absolute: 0 K/uL (ref 0.0–0.1)
Basophils Relative: 0 %
Basophils Relative: 0 %
Eosinophils Absolute: 0.3 K/uL (ref 0.0–0.5)
Eosinophils Absolute: 0 K/uL (ref 0.0–0.5)
Eosinophils Relative: 1 %
Eosinophils Relative: 0 %
HCT: 34.9 % — ABNORMAL LOW (ref 39.0–52.0)
HCT: 34 % — ABNORMAL LOW (ref 39.0–52.0)
Hemoglobin: 11.8 g/dL — ABNORMAL LOW (ref 13.0–17.0)
Hemoglobin: 11.7 g/dL — ABNORMAL LOW (ref 13.0–17.0)
Lymphocytes Relative: 5 %
Lymphocytes Relative: 13 %
Lymphs Abs: 1.6 K/uL (ref 0.7–4.0)
Lymphs Abs: 3.9 K/uL (ref 0.7–4.0)
MCH: 32.7 pg (ref 26.0–34.0)
MCH: 33.2 pg (ref 26.0–34.0)
MCHC: 33.8 g/dL (ref 30.0–36.0)
MCHC: 34.4 g/dL (ref 30.0–36.0)
MCV: 96.7 fL (ref 80.0–100.0)
MCV: 96.6 fL (ref 80.0–100.0)
Metamyelocytes Relative: 9 %
Metamyelocytes Relative: 6 %
Monocytes Absolute: 2.5 K/uL — ABNORMAL HIGH (ref 0.1–1.0)
Monocytes Absolute: 1.8 K/uL — ABNORMAL HIGH (ref 0.1–1.0)
Monocytes Relative: 8 %
Monocytes Relative: 6 %
Myelocytes: 2 %
Myelocytes: 3 %
Neutro Abs: 22.4 K/uL — ABNORMAL HIGH (ref 1.7–7.7)
Neutro Abs: 21 K/uL — ABNORMAL HIGH (ref 1.7–7.7)
Neutrophils Relative %: 64 %
Neutrophils Relative %: 70 %
Other: 4 %
Other: 2 %
Platelets: 325 K/uL (ref 150–400)
Platelets: 286 K/uL (ref 150–400)
RBC: 3.61 MIL/uL — ABNORMAL LOW (ref 4.22–5.81)
RBC: 3.52 MIL/uL — ABNORMAL LOW (ref 4.22–5.81)
RDW: 15.9 % — ABNORMAL HIGH (ref 11.5–15.5)
RDW: 15.7 % — ABNORMAL HIGH (ref 11.5–15.5)
Smear Review: NORMAL
Smear Review: NORMAL
WBC: 31.6 K/uL — ABNORMAL HIGH (ref 4.0–10.5)
WBC: 30 K/uL — ABNORMAL HIGH (ref 4.0–10.5)
nRBC: 0.8 % — ABNORMAL HIGH (ref 0.0–0.2)
nRBC: 0.7 % — ABNORMAL HIGH (ref 0.0–0.2)

## 2024-01-24 LAB — MAGNESIUM: Magnesium: 2.5 mg/dL — ABNORMAL HIGH (ref 1.7–2.4)

## 2024-01-24 MED ORDER — DEXTROSE-SODIUM CHLORIDE 5-0.9 % IV SOLN: INTRAVENOUS | Status: DC

## 2024-01-24 MED ORDER — ALLOPURINOL 300 MG PO TABS
300.0000 mg | ORAL_TABLET | Freq: Every day | ORAL | Status: DC
  Administered 2024-01-24 – 2024-01-27 (×4): 300 mg via ORAL
  Filled 2024-01-24: qty 1

## 2024-01-24 MED ORDER — ENSURE PLUS HIGH PROTEIN PO LIQD
237.0000 mL | Freq: Two times a day (BID) | ORAL | Status: DC
  Administered 2024-01-25 – 2024-01-27 (×3): 237 mL via ORAL

## 2024-01-24 NOTE — Progress Notes (Signed)
 " PROGRESS NOTE    Douglas Bryant  FMW:[id number] DOB: [birth year] DOA: 01/22/2024 PCP: Maree Isles, MD   Brief Narrative:   Douglas Bryant is a 73 y.o. male with medical history significant of hypertension, hypothyroidism, diffuse large B-cell lymphoma who presents to the emergency department due to several weeks of worsening abdominal pain with associated decreased oral intake and generalized weakness including 2  falls prior to presenting to [facility] ED on 01/18/2024 with a new abdominal mass which was concerning for malignancy and compatible with peritoneal carcinomatosis/lymphoma with moderate volume ascites.  He was diagnosed with acute kidney injury and UTI and discharged with Keflex.  He complained of worsening weakness with difficulty in being able to ambulate with a walker and complain of nausea without vomiting.  He has an appointment with oncologist on 12/31, but patient does not think he can wait till 31st, so he presents to the ED for further evaluation.  He continues to have dehydration with AKI and remains on IV fluid.  CT of the head appears stable as he has noted to have epidural hematoma and there is no significant confusion or neurologic deficits.  Patient is now awaiting paracentesis on 12/29 as well as further oncology and palliative consultation to determine further goals of care.  Assessment & Plan:   Principal Problem:   Subdural hematoma (HCC)  Assessment and Plan:   Epidural hematoma CT of head without contrast showed acute left zone millimeter extra-axial MRH-probably epidural but possibly subdural, with mild mass effect and no midline shift Neurosurgery (Dr. Louis) was consulted and recommended a repeat CT 6 hours after the first CT of head and to call back for worsening hematoma, but if CT remains same, patient may be observed at [facility] and can follow-up as an outpatient per  ED PA - Repeat CT head appears stable on repeat scan   Abdominal mass favoring lymphoma CT  abdomen and pelvis showed large infiltrative right paraduodenal space mass (at least 6.4 x 14.6 cm).  This was suggested to be lymphoma (patient has a history of diffuse large B-cell lymphoma in remission) with less likely differential being primary colonic malignancy or primary pancreatic malignancy This was 1st detected in the CT of abdomen done during ED visit to [facility] ER on 12/22 Patient states that he had an appointment with an oncologist in [place] on 12/31, but due to worsening weakness and decreased oral intake, he was not sure he can wait till 12/31, so he decided to go to the ED for further evaluation Continue IV Dilaudid  0.5 mg every 3 hours as needed for moderate/severe pain Oncology to follow in a.m. paracentesis on Monday with cytology Started on allopurinol  12/28 for elevated uric acid Palliative consultation   Acute kidney injury-improving Dehydration Creatinine 2.68 (baseline creatinine at 0.7-0.8) IV hydration was provided Renally adjust medications, avoid nephrotoxic agents/dehydration/hypotension   Starvation ketoacidosis Bicarb 17, anion gap 26 Patient has been having decreased oral intake IV hydration with NS was provided; Continue D5- NS and monitor for anion gap closure and improved bicarb   Hyponatremia-improving Na 125; this is possibly due to dehydration and diuretic effect Continue IV hydration and continue to monitor sodium levels HCTZ will be held at this time   Leukocytosis possibly due to a reactive process Continue to monitor   Acquired hypothyroidism Continue Synthroid    Essential hypertension (controlled) HCTZ will be held at this time due to hyponatremia   DVT prophylaxis: SCDs Code Status: DNR Family Communication: Sister at bedside  12/28 Disposition Plan:  Status is: Inpatient Remains inpatient appropriate because: Need for IV medications   Consultants:  EDP discussed with neurosurgery Oncology IR  Procedures:   None  Antimicrobials:  None   Subjective: Patient seen and evaluated today with ongoing severe pain and is curious about his prognosis and overall goals and plan.  No acute overnight events noted.  He still has very little oral intake.  Objective: Vitals:   01/23/24 0434 01/23/24 1328 01/23/24 2100 01/24/24 0300  BP: (!) 153/86 117/69 119/74 106/64  Pulse: 94 93 91 91  Resp: 18 18 18 17   Temp: 98.3 F (36.8 C) 98.9 F (37.2 C) 98.2 F (36.8 C) 97.8 F (36.6 C)  TempSrc: Oral Oral Axillary Axillary  SpO2: 95% 94% 95% 95%  Weight:      Height:        Intake/Output Summary (Last 24 hours) at 01/24/2024 1239 Last data filed at 01/24/2024 0931 Gross per 24 hour  Intake 480 ml  Output 650 ml  Net -170 ml   Filed Weights   01/22/24 1250 01/22/24 2054  Weight: 83.5 kg 86.1 kg    Examination:  General exam: Appears calm and comfortable  Respiratory system: Clear to auscultation. Respiratory effort normal. Cardiovascular system: S1 & S2 heard, RRR.  Gastrointestinal system: Abdomen is soft, distended Central nervous system: Alert and awake Extremities: No edema Skin: No significant lesions noted Psychiatry: Flat affect.    Data Reviewed: I have personally reviewed following labs and imaging studies  CBC: Recent Labs  Lab 01/22/24 1252 01/23/24 0602 01/24/24 0445  WBC 31.3* 32.5* 31.6*  NEUTROABS  --   --  22.4*  HGB 13.2 11.9* 11.8*  HCT 38.0* 35.4* 34.9*  MCV 93.8 97.3 96.7  PLT 436* 355 325   Basic Metabolic Panel: Recent Labs  Lab 01/22/24 1252 01/23/24 0602 01/24/24 0445  NA 127* 131* 133*  K 4.6 4.6 3.9  CL 85* 88* 92*  CO2 17* 23 21*  GLUCOSE 81 71 68*  BUN 56* 59* 59*  CREATININE 2.91* 3.00* 2.68*  CALCIUM  9.3 9.1 8.9  MG  --  2.6* 2.5*  PHOS  --  4.0  --    GFR: Estimated Creatinine Clearance: 26.9 mL/min (A) (by C-G formula based on SCr of 2.68 mg/dL (H)). Liver Function Tests: Recent Labs  Lab 01/22/24 1252 01/23/24 0602  01/24/24 0445  AST 83* 75* 75*  ALT 18 18 17   ALKPHOS 47 51 46  BILITOT 0.4 0.3 0.4  PROT 6.6 6.0* 6.0*  ALBUMIN  4.2 3.9 3.9   Recent Labs  Lab 01/22/24 1252  LIPASE 31   No results for input(s): AMMONIA in the last 168 hours. Coagulation Profile: No results for input(s): INR, PROTIME in the last 168 hours. Cardiac Enzymes: Recent Labs  Lab 01/22/24 1252  CKTOTAL 224   BNP (last 3 results) No results for input(s): PROBNP in the last 8760 hours. HbA1C: No results for input(s): HGBA1C in the last 72 hours. CBG: No results for input(s): GLUCAP in the last 168 hours. Lipid Profile: No results for input(s): CHOL, HDL, LDLCALC, TRIG, CHOLHDL, LDLDIRECT in the last 72 hours. Thyroid  Function Tests: No results for input(s): TSH, T4TOTAL, FREET4, T3FREE, THYROIDAB in the last 72 hours. Anemia Panel: No results for input(s): VITAMINB12, FOLATE, FERRITIN, TIBC, IRON, RETICCTPCT in the last 72 hours. Sepsis Labs: No results for input(s): PROCALCITON, LATICACIDVEN in the last 168 hours.  No results found for this or any previous visit (from the  past 240 hours).       Scheduled Meds:  allopurinol   300 mg Oral Daily   levothyroxine   50 mcg Oral Q0600   polyethylene glycol  17 g Oral Daily   Continuous Infusions:  dextrose  5 % and 0.9 % NaCl 60 mL/hr at 01/24/24 0750   sodium chloride        LOS: 2 days    Time spent: 55 minutes    Adrinne Sze JONETTA Fairly, DO [facility]  If 7PM-7AM, please contact night-coverage [email or web address] 01/24/2024, 12:39 PM   "

## 2024-01-24 NOTE — Progress Notes (Signed)
 Interventional Radiology Brief Note:  Douglas Bryant is a 73 y.o. male with medical history significant of hypertension, hypothyroidism, diffuse large B-cell lymphoma previously diagnosed in 2017 s/p treatment who presented to [facility] ED with progressive abdominal pain.  Patient now with large paraduodenal mass, abdominal/retroperitoneal lymphadenopathy, peritoneal carcinomatosis, and moderate ascites.  IR consulted for biopsy.  Reviewed case with Dr. Jennefer who recommends proceeding with diagnostic/therapeutic paracentesis as able for cytology. Patient would benefit from PET prior to consideration of biopsy.  Medical team made aware.   Gauge Winski, MS RD PA-C

## 2024-01-24 NOTE — Plan of Care (Signed)

## 2024-01-25 ENCOUNTER — Inpatient Hospital Stay (HOSPITAL_COMMUNITY): Payer: Medicare (Managed Care)

## 2024-01-25 ENCOUNTER — Encounter (HOSPITAL_COMMUNITY): Payer: Self-pay | Admitting: Internal Medicine

## 2024-01-25 ENCOUNTER — Encounter: Payer: Self-pay | Admitting: *Deleted

## 2024-01-25 DIAGNOSIS — D72829 Elevated white blood cell count, unspecified: Secondary | ICD-10-CM | POA: Diagnosis not present

## 2024-01-25 LAB — BODY FLUID CELL COUNT WITH DIFFERENTIAL
Lymphs, Fluid: 2 %
Monocyte-Macrophage-Serous Fluid: 68 % (ref 50–90)
Neutrophil Count, Fluid: 30 % — ABNORMAL HIGH (ref 0–25)
Total Nucleated Cell Count, Fluid: >10000 uL — ABNORMAL HIGH (ref 0–1000)

## 2024-01-25 LAB — CBC WITH DIFFERENTIAL/PLATELET
Abs Immature Granulocytes: 2.3 K/uL — ABNORMAL HIGH (ref 0.00–0.07)
Band Neutrophils: 4 %
Basophils Absolute: 0 K/uL (ref 0.0–0.1)
Basophils Relative: 0 %
Eosinophils Absolute: 0.3 K/uL (ref 0.0–0.5)
Eosinophils Relative: 1 %
HCT: 33 % — ABNORMAL LOW (ref 39.0–52.0)
Hemoglobin: 11 g/dL — ABNORMAL LOW (ref 13.0–17.0)
Lymphocytes Relative: 18 %
Lymphs Abs: 5.2 K/uL — ABNORMAL HIGH (ref 0.7–4.0)
MCH: 32.7 pg (ref 26.0–34.0)
MCHC: 33.3 g/dL (ref 30.0–36.0)
MCV: 98.2 fL (ref 80.0–100.0)
Metamyelocytes Relative: 6 %
Monocytes Absolute: 0.9 K/uL (ref 0.1–1.0)
Monocytes Relative: 3 %
Myelocytes: 2 %
Neutro Abs: 20.2 K/uL — ABNORMAL HIGH (ref 1.7–7.7)
Neutrophils Relative %: 66 %
Platelets: 207 K/uL (ref 150–400)
RBC: 3.36 MIL/uL — ABNORMAL LOW (ref 4.22–5.81)
RDW: 15.9 % — ABNORMAL HIGH (ref 11.5–15.5)
Smear Review: NORMAL
WBC: 28.8 K/uL — ABNORMAL HIGH (ref 4.0–10.5)
nRBC: 0.7 % — ABNORMAL HIGH (ref 0.0–0.2)

## 2024-01-25 LAB — COMPREHENSIVE METABOLIC PANEL WITH GFR
ALT: 16 U/L (ref 0–44)
ALT: 14 U/L (ref 0–44)
AST: 71 U/L — ABNORMAL HIGH (ref 15–41)
AST: 64 U/L — ABNORMAL HIGH (ref 15–41)
Albumin: 3.7 g/dL (ref 3.5–5.0)
Albumin: 4.1 g/dL (ref 3.5–5.0)
Alkaline Phosphatase: 45 U/L (ref 38–126)
Alkaline Phosphatase: 40 U/L (ref 38–126)
Anion gap: 19 — ABNORMAL HIGH (ref 5–15)
Anion gap: 26 — ABNORMAL HIGH (ref 5–15)
BUN: 60 mg/dL — ABNORMAL HIGH (ref 8–23)
BUN: 66 mg/dL — ABNORMAL HIGH (ref 8–23)
CO2: 21 mmol/L — ABNORMAL LOW (ref 22–32)
CO2: 15 mmol/L — ABNORMAL LOW (ref 22–32)
Calcium: 8.8 mg/dL — ABNORMAL LOW (ref 8.9–10.3)
Calcium: 8.9 mg/dL (ref 8.9–10.3)
Chloride: 95 mmol/L — ABNORMAL LOW (ref 98–111)
Chloride: 95 mmol/L — ABNORMAL LOW (ref 98–111)
Creatinine, Ser: 2.51 mg/dL — ABNORMAL HIGH (ref 0.61–1.24)
Creatinine, Ser: 2.88 mg/dL — ABNORMAL HIGH (ref 0.61–1.24)
GFR, Estimated: 26 mL/min — ABNORMAL LOW
GFR, Estimated: 22 mL/min — ABNORMAL LOW
Glucose, Bld: 73 mg/dL (ref 70–99)
Glucose, Bld: 56 mg/dL — ABNORMAL LOW (ref 70–99)
Potassium: 3.9 mmol/L (ref 3.5–5.1)
Potassium: 4.2 mmol/L (ref 3.5–5.1)
Sodium: 135 mmol/L (ref 135–145)
Sodium: 136 mmol/L (ref 135–145)
Total Bilirubin: 0.4 mg/dL (ref 0.0–1.2)
Total Bilirubin: 0.6 mg/dL (ref 0.0–1.2)
Total Protein: 5.6 g/dL — ABNORMAL LOW (ref 6.5–8.1)
Total Protein: 5.7 g/dL — ABNORMAL LOW (ref 6.5–8.1)

## 2024-01-25 LAB — GRAM STAIN

## 2024-01-25 LAB — MAGNESIUM: Magnesium: 2.5 mg/dL — ABNORMAL HIGH (ref 1.7–2.4)

## 2024-01-25 LAB — LACTATE DEHYDROGENASE: LDH: 2419 U/L — ABNORMAL HIGH (ref 105–235)

## 2024-01-25 LAB — URIC ACID: Uric Acid, Serum: >21 mg/dL — ABNORMAL HIGH (ref 3.7–8.6)

## 2024-01-25 LAB — PHOSPHORUS: Phosphorus: 3.4 mg/dL (ref 2.5–4.6)

## 2024-01-25 LAB — PATHOLOGIST SMEAR REVIEW

## 2024-01-25 MED ORDER — LIDOCAINE HCL (PF) 2 % IJ SOLN
10.0000 mL | Freq: Once | INTRAMUSCULAR | Status: AC
  Administered 2024-01-25: 10 mL
  Filled 2024-01-25: qty 10

## 2024-01-25 MED ORDER — SODIUM CHLORIDE 0.9 % IV SOLN: INTRAVENOUS | Status: DC

## 2024-01-25 MED ORDER — ALBUMIN HUMAN 25 % IV SOLN
37.5000 g | Freq: Once | INTRAVENOUS | Status: AC
  Administered 2024-01-25: 37.5 g via INTRAVENOUS
  Filled 2024-01-25: qty 150

## 2024-01-25 MED ORDER — OXYCODONE HCL 5 MG PO TABS
5.0000 mg | ORAL_TABLET | ORAL | Status: DC | PRN
  Administered 2024-01-25 – 2024-01-26 (×4): 5 mg via ORAL
  Filled 2024-01-25 (×4): qty 1

## 2024-01-25 MED ORDER — SALINE SPRAY 0.65 % NA SOLN
1.0000 | NASAL | Status: DC | PRN
  Administered 2024-01-25: 1 via NASAL
  Filled 2024-01-25: qty 44

## 2024-01-25 MED ORDER — LIDOCAINE HCL (PF) 2 % IJ SOLN
INTRAMUSCULAR | Status: AC
  Filled 2024-01-25: qty 10

## 2024-01-25 NOTE — Plan of Care (Signed)
" °  Problem: Acute Rehab PT Goals(only PT should resolve) Goal: Pt Will Go Supine/Side To Sit Outcome: Progressing Flowsheets (Taken 01/25/2024 1603) Pt will go Supine/Side to Sit: with minimal assist Goal: Patient Will Transfer Sit To/From Stand Outcome: Progressing Flowsheets (Taken 01/25/2024 1603) Patient will transfer sit to/from stand: with minimal assist Goal: Pt Will Transfer Bed To Chair/Chair To Bed Outcome: Progressing Flowsheets (Taken 01/25/2024 1603) Pt will Transfer Bed to Chair/Chair to Bed: with min assist Goal: Pt Will Ambulate Outcome: Progressing Flowsheets (Taken 01/25/2024 1603) Pt will Ambulate:  15 feet  with moderate assist  with rolling walker   4:04 PM, 01/25/2024 Lynwood Music, MPT Physical Therapist with [facility] [phone] office [phone] mobile phone  "

## 2024-01-25 NOTE — Consult Note (Addendum)
 "     Chief Complaint: Leukocytosis with new mass with concern for lymphoma/ Lymphoma/ malignancy. Team is requesting a bone marrow biopsy   Referring Physician(s): Dr. VEAR Dry  Supervising Physician: Jennefer Rover  Patient Status: Douglas Bryant Inpatient  History of Present Illness: Douglas Bryant is a 73 y.o. male  inpatient. History of diffuse large B cell lymphoma in remission for 10 years, lumbar fusion on 9.5.25. Recently found at [facility] to have a new mesenteric mass, AKI, UTI.  Presented to the ED at [facility] on 12.26.25 for abdominal pain., nausea,diarrhea, general weakness. Found to have leukocytosis Team  is requesting bone marrow biopsy for further evaluation of  lymphoma versus leukemia versus other malignancy.  Case approved by IR Der. Dylan Suttle.   Currently without any significant complaints. Patient alert and laying in bed,calm. Denies any fevers, headache, chest pain, SOB, cough, abdominal pain, nausea, vomiting or bleeding.    BUN 60, Cr 2.51, AST 71, total protein 5.6, GFR 26. WBC 30.0 CT Abd pelvis from 12.26.25 shows large paraduodenal mass  omental caking, mesenteric and celiac adenopathy and Stable lytic lesion in the right ilium. No pertinent allergies.   Return precautions and treatment recommendations and follow-up discussed with the patient  who is agreeable with the plan.   Patient currently has DNR order in place. Discussion with the patient and family regarding wishes.  The original DNR order is maintained during the procedure and prior treatment limitations are upheld during the procedure.    Past Surgical History:  Procedure Laterality Date   ANTERIOR AND POSTERIOR SPINAL FUSION N/A 10/01/2023   Procedure: EXTREME LATERAL INTERBODY FUSION OF LUMBAR THREE TO LUMBAR FOUR WITH POSTERIOR SUPPLEMENTAL FUSION INSTRAMENTED OF LUMBAR THREE TO LUMBAR FOUR;  Surgeon: Burnetta Aures, MD;  Location: [facility] OR;  Service: Orthopedics;  Laterality: N/A;  Extreme lateral  interbody fusion L3-4,Posterior spinal fusion instrumentation L3-4   BONE MARROW BIOPSY     COLONOSCOPY N/A 08/12/2018   Procedure: COLONOSCOPY;  Surgeon: Golda Claudis PENNER, MD;  Location: [facility] SUITE;  Service: Endoscopy;  Laterality: N/A;  1030   HERNIA REPAIR     umblical times 2; inguinal (left)    IR REMOVAL TUN ACCESS W/ PORT W/O FL MOD SED  07/08/2016   POLYPECTOMY  08/12/2018   Procedure: POLYPECTOMY;  Surgeon: Golda Claudis PENNER, MD;  Location: [facility];  Service: Endoscopy;;  colon   port a cath placement     RHINOPLASTY      Allergies: Flomax [tamsulosin hcl]  Medications: Prior to Admission medications  Medication Sig Start Date End Date Taking? Authorizing Provider  acetaminophen  (TYLENOL ) 500 MG tablet Take 1,000 mg by mouth every 6 (six) hours as needed for mild pain.    [provider]  albuterol (VENTOLIN HFA) 108 (90 Base) MCG/ACT inhaler Inhale 1-2 puffs into the lungs every 4 (four) hours as needed for wheezing or shortness of breath. 09/02/23   [provider]  bismuth subsalicylate (PEPTO BISMOL) 262 MG/15ML suspension Take 30 mLs by mouth every 6 (six) hours as needed for indigestion or diarrhea or loose stools.    [provider]  busPIRone  (BUSPAR ) 10 MG tablet Take 10 mg by mouth 2 (two) times daily. 01/12/24   [provider]  Ca Carbonate-Mag Hydroxide (ROLAIDS PO) Take 2 tablets by mouth daily as needed (heartburn).    [provider]  Carboxymethylcellul-Glycerin (LUBRICATING EYE DROPS OP) Place 1 drop into both eyes daily as needed (dry eyes).  [provider]  cephALEXin (KEFLEX) 500 MG capsule Take 500 mg by mouth 3 (three) times daily. 01/18/24 February 14, 2024  [provider]  chlorpheniramine (CHLOR-TRIMETON) 4 MG tablet Take 4 mg by mouth every 4 (four) hours as needed for allergies.    [provider]  diphenhydrAMINE (BENADRYL) 25 mg capsule Take 25 mg by mouth daily.    [provider]  famotidine (PEPCID) 20 MG tablet Take 20 mg by mouth 2 (two) times daily as needed for heartburn or indigestion.    [provider]  fluticasone (FLONASE) 50 MCG/ACT nasal spray Place 1 spray into both nostrils daily as needed for allergies or rhinitis.    [provider]  hydrochlorothiazide  (HYDRODIURIL ) 25 MG tablet Take 1 tablet (25 mg total) by mouth daily. 08/07/21   Barrett, Erin R, PA-C  levothyroxine  (SYNTHROID ) 50 MCG tablet Take 50 mcg by mouth daily. 01/13/23   [provider]  LINZESS 72 MCG capsule Take 72 mcg by mouth every morning. 01/13/24   [provider]  LORazepam  (ATIVAN ) 0.5 MG tablet Take by mouth. 11/18/23   [provider]  methocarbamol  (ROBAXIN ) 500 MG tablet Take 500 mg by mouth 2 (two) times daily. 12/31/23   [provider]  metoprolol  succinate (TOPROL  XL) 25 MG 24 hr tablet Take 1 tablet (25 mg total) by mouth daily. Take with or immediately following a meal. 08/07/21 11/26/23  Barrett, Erin R, PA-C  ondansetron  (ZOFRAN ) 4 MG tablet Take 1 tablet (4 mg total) by mouth every 8 (eight) hours as needed for nausea or vomiting. 10/01/23   Burnetta Aures, MD  ondansetron  (ZOFRAN -ODT) 4 MG disintegrating tablet Take 4 mg by mouth every 8 (eight) hours as needed. 01/18/24 01/25/24  [provider]  oxymetazoline  (AFRIN) 0.05 % nasal spray Place 1-2 sprays into both nostrils 2 (two) times daily as needed for congestion.    [provider]  senna-docusate (SENOKOT-S) 8.6-50 MG tablet Take 1 tablet by mouth 2 (two) times daily. 10/03/23   Kit Rush, MD  sertraline  (ZOLOFT ) 50 MG tablet Take 1 tablet (50 mg total) by mouth daily. Patient taking differently: Take 50 mg by mouth daily as needed (Anxiety). 03/04/23   Plovsky, Elna, MD  traMADol DANNY) 50 MG tablet Take by mouth. 01/18/24   [provider]  valACYclovir (VALTREX) 500 MG tablet Take 500 mg by mouth daily as needed (Herpes  Virus).    [provider]  zolpidem  (AMBIEN ) 10 MG tablet Take 1 tablet (10 mg total) by mouth at bedtime. 07/22/23   Tasia Elna, MD     Family History  Problem Relation Age of Onset   Alcohol  abuse Father    Depression Sister    Anxiety disorder Sister    Depression Sister    Anxiety disorder Sister     Social History   Socioeconomic History   Marital status: Divorced    Spouse name: Not on file   Number of children: Not on file   Years of education: Not on file   Highest education level: Not on file  Occupational History   Not on file  Tobacco Use   Smoking status: Never   Smokeless tobacco: Never  Vaping Use   Vaping status: Never Used  Substance and Sexual Activity   Alcohol  use: Yes    Alcohol /week: 6.0 standard drinks of alcohol     Types: 6 Cans of beer per week    Comment: occasional beer   Drug use: Yes  Frequency: 2.0 times per week    Types: Marijuana    Comment: smokes daily for thirty years. currently takes one puff   Sexual activity: Yes    Partners: Female    Birth control/protection: Condom  Other Topics Concern   Not on file  Social History Narrative   Not on file   Social Drivers of Health   Tobacco Use: Low Risk (01/25/2024)   Patient History    Smoking Tobacco Use: Never    Smokeless Tobacco Use: Never    Passive Exposure: Not on file  Financial Resource Strain: Not on file  Food Insecurity: No Food Insecurity (01/22/2024)   Epic    Worried About Programme Researcher, Broadcasting/film/video in the Last Year: Never true    Ran Out of Food in the Last Year: Never true  Transportation Needs: No Transportation Needs (01/22/2024)   Epic    Lack of Transportation (Medical): No    Lack of Transportation (Non-Medical): No  Physical Activity: Not on file  Stress: Not on file  Social Connections: Unknown (01/22/2024)   Social Connection and Isolation Panel    Frequency of Communication with Friends and Family: Three times a week    Frequency of Social  Gatherings with Friends and Family: Twice a week    Attends Religious Services: More than 4 times per year    Active Member of Clubs or Organizations: No    Attends Engineer, Structural: More than 4 times per year    Marital Status: Patient declined  Depression (PHQ2-9): Low Risk (11/26/2023)   Depression (PHQ2-9)    PHQ-2 Score: 0  Alcohol  Screen: Not on file  Housing: Low Risk (01/22/2024)   Epic    Unable to Pay for Housing in the Last Year: No    Number of Times Moved in the Last Year: 0    Homeless in the Last Year: No  Utilities: Not At Risk (01/22/2024)   Epic    Threatened with loss of utilities: No  Health Literacy: Not on file     Review of Systems: A 12 point ROS discussed and pertinent positives are indicated in the HPI above.  All other systems are negative.  Review of Systems  Constitutional:  Negative for fever.  HENT:  Negative for congestion.   Respiratory:  Negative for cough and shortness of breath.   Cardiovascular:  Negative for chest pain.  Gastrointestinal:  Negative for abdominal pain.  Neurological:  Negative for headaches.  Psychiatric/Behavioral:  Negative for behavioral problems and confusion.     Vital Signs: BP 118/65   Pulse 88   Temp 97.6 F (36.4 C) (Oral)   Resp 18   Ht 6' 1 (1.854 m)   Wt 189 lb 13.1 oz (86.1 kg)   SpO2 94%   BMI 25.04 kg/m   Advance Care Plan: The advanced care plan/surrogate decision maker was discussed at the time of visit and the patient did not wish to discuss or was not able to name a surrogate decision maker or provide an advance care plan.    Physical Exam Vitals and nursing note reviewed.  Constitutional:      Appearance: He is well-developed. He is ill-appearing.  HENT:     Head: Normocephalic.  Cardiovascular:     Rate and Rhythm: Normal rate.  Pulmonary:     Effort: Pulmonary effort is normal.  Abdominal:     General: There is distension.  Musculoskeletal:        General: Normal  range of motion.     Cervical back: Normal range of motion.  Skin:    General: Skin is warm and dry.  Neurological:     General: No focal deficit present.     Mental Status: He is alert and oriented to person, place, and time.  Psychiatric:        Mood and Affect: Mood normal.        Behavior: Behavior normal.     Imaging:   Labs:  CBC: Recent Labs    01/22/24 1252 01/23/24 0602 01/24/24 0445 01/24/24 1302  WBC 31.3* 32.5* 31.6* 30.0*  HGB 13.2 11.9* 11.8* 11.7*  HCT 38.0* 35.4* 34.9* 34.0*  PLT 436* 355 325 286    COAGS: No results for input(s): INR, APTT in the last 8760 hours.  BMP: Recent Labs    01/22/24 1252 01/23/24 0602 01/24/24 0445 01/25/24 0502  NA 127* 131* 133* 135  K 4.6 4.6 3.9 3.9  CL 85* 88* 92* 95*  CO2 17* 23 21* 21*  GLUCOSE 81 71 68* 73  BUN 56* 59* 59* 60*  CALCIUM  9.3 9.1 8.9 8.8*  CREATININE 2.91* 3.00* 2.68* 2.51*  GFRNONAA 22* 21* 24* 26*    LIVER FUNCTION TESTS: Recent Labs    01/22/24 1252 01/23/24 0602 01/24/24 0445 01/25/24 0502  BILITOT 0.4 0.3 0.4 0.4  AST 83* 75* 75* 71*  ALT 18 18 17 16   ALKPHOS 47 51 46 45  PROT 6.6 6.0* 6.0* 5.6*  ALBUMIN  4.2 3.9 3.9 3.7    TUMOR MARKERS: No results for input(s): AFPTM, CEA, CA199, CHROMGRNA in the last 8760 hours.  Assessment and Plan:  73 y.o. male inpatient. History of diffuse large B cell lymphoma in remission for 10 years, lumbar fusion on 9.5.25. Recently found at [facility] to have a new mesenteric mass, AKI, UTI.  Presented to the ED at [facility] on 12.26.25 for abdominal pain, nausea,diarrhea, general weakness. Found to have leukocytosis Team  is requesting bone marrow biopsy for further evaluation of  lymphoma versus leukemia versus other malignancy.  Case approved by IR Der. Dylan Suttle.   PLAN: IR Image Guided Bone Marrow Biopsy.  Patient tentatively scheduled for 12.30.25.  Team instructed to: Keep Patient to be NPO after midnight Hold prophylactic  anticoagulation 24 hours prior to scheduled procedure.             Coordinate carelink for transport to [facility] on 12.30.25 @ 0830. Patient will be transferred back to [facility] post procedure.   Risks and benefits of bone marrow biopsy was discussed with the patient and/or patient's family including, but not limited to bleeding, infection, damage to adjacent structures or low yield requiring additional tests.  All of the questions were answered and there is agreement to proceed.  Consent signed and in chart.   Thank you for this interesting consult.  I greatly enjoyed meeting Koltyn Kelsay Bryant and look forward to participating in their care.  A copy of this report was sent to the requesting provider on this date.  Electronically Signed: Delon JAYSON Beagle, NP 01/25/2024, 2:18 PM   I spent a total of 40 Minutes    in face to face in clinical consultation, greater than 50% of which was counseling/coordinating care for bone marrow biopsy   "

## 2024-01-25 NOTE — Progress Notes (Signed)
 " PROGRESS NOTE    Douglas Bryant  FMW:[id number] DOB: [birth year] DOA: 01/22/2024 PCP: Douglas Isles, MD   Brief Narrative:  73 y.o. 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    Went to outside ED 12/22 and found to have new abdominal mass compatible with new lymphoma mass with ascites, as well as AKI and UTI.  Had pending appointment with oncology, but was too weak and falling, so came to the ER for further evaluation, where he was found to have again dehydration with AKI.  CT showed epidural hematoma.  Repeat CT head has remained stable.  Now awaiting paracentesis 12/29 by IR, as well as oncology and palliative care consultations.          Assessment and Plan:  Abdominal mass favoring lymphoma History of diffuse large B-cell lymphoma Anemia Leukocytosis CT abdomen and pelvis shows a large mass in the periduodenal space.  Differential includes lymphoma versus primary pancreatic or colonic malignancy.  Discussed with IR, unable to biopsy given ascites.  In the setting of anemia and leukocytosis and previous history of R-CHOP, hematology are concerned about leukemia.  Doubt tumor lysis syndrome given normal electrolytes. - Obtain CT chest - Follow pathologist smear review - Obtain paracentesis today and sent for cytology and flow cytometry - Trend TLS labs, urate, LDH, phosphorus    Epidural hematoma Epidural hematoma noted on imaging.  Case was discussed with Neurosurgery (Dr. Louis) who recommended short interval repeat, and observation given stability.   - Outpatient NSGY follow up pending goals of care    AKI Dehydration Creatinine increased to 3 on admission, down to 2.5 in the last 24 hours (baseline creatinine at 0.7-0.8) Creatinine improving with fluids - Continue IV fluids - Daily BMP   Starvation  ketoacidosis Improving with fluids   Hyponatremia Resolved with IV fluids -Hold HCTZ - Monitor with fluids   Acquired hypothyroidism -Continue levothyroxine    Essential hypertension (controlled) Blood pressure stable - Hold HCTZ   DVT prophylaxis: SCDs Code Status: DNR Family Communication: Son at the bedside Disposition Plan:  Status is: Inpatient Remains inpatient appropriate because: Ongoing workup, will have bone marrow biopsy at [facility] tomorrow, then transport back for ongoing treatment   Consultants:  EDP discussed with neurosurgery Oncology IR  Procedures:  Paracentesis today Planning bone marrow biopsy tomorrow  Antimicrobials:  None   Subjective: Having abdominal discomfort.  Did not tolerate Dilaudid  well.  No fever overnight.  Objective: Vitals:   01/24/24 0300 01/24/24 1449 01/24/24 2025 01/25/24 0450  BP: 106/64 134/75 118/76 110/65  Pulse: 91 88 87 91  Resp: 17 19 18 20   Temp: 97.8 F (36.6 C) 97.6 F (36.4 C) (!) 97.3 F (36.3 C) 98.1 F (36.7 C)  TempSrc: Axillary Oral Oral   SpO2: 95% 94% 94% 94%  Weight:      Height:        Intake/Output Summary (Last 24 hours) at 01/25/2024 0746 Last data filed at 01/24/2024 1900 Gross per 24 hour  Intake 554.72 ml  Output 125 ml  Net 429.72 ml   Filed Weights   01/22/24 1250 01/22/24 2054  Weight: 83.5 kg 86.1 kg    Examination:  General exam: Appears tired and uncomfortable Respiratory system: Clear to auscultation. Respiratory effort normal. Cardiovascular system: S1 & S2 heard, RRR.  Gastrointestinal system: Abdomen is soft, distended, mild diffuse pain throughout Central nervous  system: Alert and awake, tired Extremities: No edema Skin: No significant lesions noted Psychiatry: Oriented x 3, generalized weakness      Data Reviewed: I have personally reviewed following labs and imaging studies  CBC: Recent Labs  Lab 01/22/24 1252 01/23/24 0602 01/24/24 0445  01/24/24 1302  WBC 31.3* 32.5* 31.6* 30.0*  NEUTROABS  --   --  22.4* 21.0*  HGB 13.2 11.9* 11.8* 11.7*  HCT 38.0* 35.4* 34.9* 34.0*  MCV 93.8 97.3 96.7 96.6  PLT 436* 355 325 286   Basic Metabolic Panel: Recent Labs  Lab 01/22/24 1252 01/23/24 0602 01/24/24 0445 01/25/24 0502  NA 127* 131* 133* 135  K 4.6 4.6 3.9 3.9  CL 85* 88* 92* 95*  CO2 17* 23 21* 21*  GLUCOSE 81 71 68* 73  BUN 56* 59* 59* 60*  CREATININE 2.91* 3.00* 2.68* 2.51*  CALCIUM  9.3 9.1 8.9 8.8*  MG  --  2.6* 2.5* 2.5*  PHOS  --  4.0  --   --    GFR: Estimated Creatinine Clearance: 28.7 mL/min (A) (by C-G formula based on SCr of 2.51 mg/dL (H)). Liver Function Tests: Recent Labs  Lab 01/22/24 1252 01/23/24 0602 01/24/24 0445 01/25/24 0502  AST 83* 75* 75* 71*  ALT 18 18 17 16   ALKPHOS 47 51 46 45  BILITOT 0.4 0.3 0.4 0.4  PROT 6.6 6.0* 6.0* 5.6*  ALBUMIN  4.2 3.9 3.9 3.7   Recent Labs  Lab 01/22/24 1252  LIPASE 31   No results for input(s): AMMONIA in the last 168 hours. Coagulation Profile: No results for input(s): INR, PROTIME in the last 168 hours. Cardiac Enzymes: Recent Labs  Lab 01/22/24 1252  CKTOTAL 224   BNP (last 3 results) No results for input(s): PROBNP in the last 8760 hours. HbA1C: No results for input(s): HGBA1C in the last 72 hours. CBG: No results for input(s): GLUCAP in the last 168 hours. Lipid Profile: No results for input(s): CHOL, HDL, LDLCALC, TRIG, CHOLHDL, LDLDIRECT in the last 72 hours. Thyroid  Function Tests: No results for input(s): TSH, T4TOTAL, FREET4, T3FREE, THYROIDAB in the last 72 hours. Anemia Panel: No results for input(s): VITAMINB12, FOLATE, FERRITIN, TIBC, IRON, RETICCTPCT in the last 72 hours. Sepsis Labs: No results for input(s): PROCALCITON, LATICACIDVEN in the last 168 hours.  No results found for this or any previous visit (from the past 240 hours).       Scheduled Meds:   allopurinol   300 mg Oral Daily   feeding supplement  237 mL Oral BID BM   levothyroxine   50 mcg Oral Q0600   polyethylene glycol  17 g Oral Daily   Continuous Infusions:  dextrose  5 % and 0.9 % NaCl 60 mL/hr at 01/25/24 0604   sodium chloride        LOS: 3 days    Time spent: 55 minutes    Douglas SHAUNNA Dalton, MD [facility]  If 7PM-7AM, please contact night-coverage [email or web address] 01/25/2024, 7:46 AM   "

## 2024-01-25 NOTE — Progress Notes (Signed)
 Patient tolerated right sided Paracentesis procedure well today and 5.5 Liters of hazy tan colored ascites removed with labs collected and sent to lab for processing. Patient verbalized understanding of post procedure instructions and transported via stretcher back to inpatient bed assignment at this time with no acute distress noted. MD made aware of amount of fluid drawn today and will order Albumin  to be given.

## 2024-01-25 NOTE — Evaluation (Signed)
 Occupational Therapy Evaluation Patient Details Name: Douglas Bryant MRN: [id number] DOB: [birth year] Today's Date: 01/25/2024   History of Present Illness   Douglas Bryant is a 73 y.o. male with medical history significant of hypertension, hypothyroidism, diffuse large B-cell lymphoma who presents to the emergency department due to several weeks of worsening abdominal pain with associated decreased oral intake and generalized weakness including 2  falls prior to presenting to [facility] ED on 01/18/2024 with a new abdominal mass which was concerning for malignancy and compatible with peritoneal carcinomatosis/lymphoma with moderate volume ascites.  He was diagnosed with acute kidney injury and UTI and discharged with Keflex.  He complained of worsening weakness with difficulty in being able to ambulate with a walker and complain of nausea without vomiting.  He has an appointment with oncologist on 12/31, but patient does not think he can wait till 31st, so he presents to the ED for further evaluation. (per MD)     Clinical Impressions Pt agreeable to OT and PT co-evaluation. Pt reports significant decline in function recently. Pt required max A +2 for EOB to chair transfer today. Min to mod A needed for bed mobility with pt reporting pain throughout the session. B UE generally weak but WFL. Pt needing mod to max A for lower body ADL's based on observation and clinical judgement. Pt left in the chair with call bell within reach. Pt will benefit from continued OT in the hospital to increase strength, balance, and endurance for safe ADL's.        If plan is discharge home, recommend the following:   A lot of help with walking and/or transfers;Two people to help with walking and/or transfers;A lot of help with bathing/dressing/bathroom;Assistance with cooking/housework;Assist for transportation;Help with stairs or ramp for entrance     Functional Status Assessment   Patient has had a recent  decline in their functional status and demonstrates the ability to make significant improvements in function in a reasonable and predictable amount of time.     Equipment Recommendations   None recommended by OT             Precautions/Restrictions   Precautions Precautions: Fall Recall of Precautions/Restrictions: Intact Restrictions Weight Bearing Restrictions Per Provider Order: No     Mobility Bed Mobility Overal bed mobility: Needs Assistance Bed Mobility: Supine to Sit     Supine to sit: Min assist, Mod assist     General bed mobility comments: Assist for R LE movement to EOB and to bring torso to upright position at EOB.    Transfers Overall transfer level: Needs assistance Equipment used: Rolling walker (2 wheels) Transfers: Sit to/from Stand, Bed to chair/wheelchair/BSC Sit to Stand: Max assist, +2 physical assistance     Step pivot transfers: Max assist     General transfer comment: EOB to chair with RW; Much assist to boost from EOB followed by labored movement to step to chair using RW.      Balance Overall balance assessment: Needs assistance Sitting-balance support: No upper extremity supported, Feet supported Sitting balance-Leahy Scale: Fair Sitting balance - Comments: seated at EOB   Standing balance support: Bilateral upper extremity supported, During functional activity, Reliant on assistive device for balance Standing balance-Leahy Scale: Poor Standing balance comment: using RW                           ADL either performed or assessed with clinical judgement   ADL Overall ADL's :  Needs assistance/impaired     Grooming: Set up;Sitting   Upper Body Bathing: Set up;Sitting   Lower Body Bathing: Maximal assistance;Sitting/lateral leans;Moderate assistance   Upper Body Dressing : Set up;Sitting   Lower Body Dressing: Maximal assistance;Sitting/lateral leans Lower Body Dressing Details (indicate cue type and reason):  Required assist to don socks seated at EOB. Toilet Transfer: Maximal assistance;+2 for physical assistance;Rolling walker (2 wheels);Stand-pivot Statistician Details (indicate cue type and reason): EOB to chair with RW Toileting- Clothing Manipulation and Hygiene: Maximal assistance;Sitting/lateral lean;Sit to/from stand               Vision   Vision Assessment?: No apparent visual deficits     Perception Perception: Not tested       Praxis Praxis: Not tested       Pertinent Vitals/Pain Pain Assessment Pain Assessment: Faces Faces Pain Scale: Hurts even more Pain Location: General Pain Descriptors / Indicators: Other (Comment) (Total discomfort) Pain Intervention(s): Monitored during session, Repositioned, Limited activity within patient's tolerance     Extremity/Trunk Assessment Upper Extremity Assessment Upper Extremity Assessment: Generalized weakness   Lower Extremity Assessment Lower Extremity Assessment: Defer to PT evaluation   Cervical / Trunk Assessment Cervical / Trunk Assessment: Kyphotic   Communication Communication Communication: No apparent difficulties   Cognition Arousal: Alert Behavior During Therapy: WFL for tasks assessed/performed Cognition: No apparent impairments                               Following commands: Intact       Cueing  General Comments   Cueing Techniques: Verbal cues;Tactile cues                 Home Living Family/patient expects to be discharged to:: Private residence Living Arrangements: Alone Available Help at Discharge: Family;Available PRN/intermittently Type of Home: House Home Access: Stairs to enter [employer] of Steps: 3 Entrance Stairs-Rails: Right;Left;Can reach both Home Layout: Two level;Able to live on main level with bedroom/bathroom     Bathroom Shower/Tub: Chief Strategy Officer: Standard Bathroom Accessibility: Yes How Accessible: Accessible  via walker Home Equipment: Rolling Walker (2 wheels);Cane - single point;Shower seat;Grab bars - tub/shower;Adaptive equipment Adaptive Equipment: Reacher        Prior Functioning/Environment Prior Level of Function : Needs assist;History of Falls (last six months) (2 falls in december)       Physical Assist : ADLs (physical);Mobility (physical) Mobility (physical): Transfers;Gait   Mobility Comments: Pt has been ambulating with the RW but recently struggled with that. Driving some. ADLs Comments: Independent prior to recent issues.    OT Problem List: Decreased strength;Decreased activity tolerance;Impaired balance (sitting and/or standing);Pain   OT Treatment/Interventions: Self-care/ADL training;Therapeutic exercise;Therapeutic activities;Balance training;Patient/family education;DME and/or AE instruction;Energy conservation      OT Goals(Current goals can be found in the care plan section)   Acute Rehab OT Goals Patient Stated Goal: Improve function. OT Goal Formulation: With patient Time For Goal Achievement: 02/08/24 Potential to Achieve Goals: Good   OT Frequency:  Min 3X/week    Co-evaluation PT/OT/SLP Co-Evaluation/Treatment: Yes Reason for Co-Treatment: To address functional/ADL transfers   OT goals addressed during session: ADL's and self-care                       End of Session Equipment Utilized During Treatment: Rolling walker (2 wheels);Gait belt  Activity Tolerance: Patient tolerated treatment well Patient left: in chair;with  call bell/phone within reach;with family/visitor present  OT Visit Diagnosis: Unsteadiness on feet (R26.81);Other abnormalities of gait and mobility (R26.89);Muscle weakness (generalized) (M62.81);History of falling (Z91.81)                Time: 8991-8973 OT Time Calculation (min): 18 min Charges:  OT General Charges $OT Visit: 1 Visit OT Evaluation $OT Eval Low Complexity: 1 Low  Kareen Hitsman OT,  MOT   Jayson Person 01/25/2024, 1:33 PM

## 2024-01-25 NOTE — TOC Initial Note (Signed)
 Transition of Care [facility]) - Initial/Assessment Note    Patient Details  Name: Douglas Bryant MRN: [id number] Date of Birth: 11/23/1950  [facility] of [facility]) CM/SW Contact:    Ronnald MARLA Sil, RN Phone Number: 01/25/2024, 5:40 PM  Clinical Narrative:                 Patient with PMH of MMPs including diffuse large B-cell lymphoma, admitted 12/26 for AKI / UTI and SDH s/p Falls at home, found to have new abdominal mass concerning for malignancy and compatible with peritoneal carcinomatosis/lymphoma with moderate volume ascites. Patient is s/p paracentesis earlier today yielding 5.5 liters of hazy tan colored fluid    CM visited with Patient at bedside, confirmed patient lives alone, is independent with ADLs with intermittent need for assistive devices, drives, and has a RW, Occupational Hygienist, wheelchair, and BSC obtained from his Sister - Devere that is very supportive and lives in [place]  CM reviewed therapy recommendations for SNF and patient agreeable, provided SNF listing from medicare.gov with facilities located within 25 miles of his residence in [place].  Patient [place] insured by [place] [employer] that may be a barrier to placement at various SNF's, patient aware and is agreeable to CM forwarding SNF referrals throughout the region to optimize bed offers.  Patient indicated I don't care where the place is so long as I have good rehab, but was adamant against [facility] stating his sister was there and he had a bad experience / argument with staff.  FL2 started, pending PASRR #.  CM forwarded referrals per conversation with patient and will continue to follow along and assist as appropriate to finalize safe discharge plan.   Expected Discharge Plan: Skilled Nursing Facility Barriers to Discharge: Continued Medical Work up, SNF Pending bed offer   Patient Goals and CMS Choice Patient states their goals for this hospitalization and ongoing recovery are:: SNF for STR CMS Medicare.gov Compare Post  Acute Care list provided to:: Patient Choice offered to / list presented to : Patient [place] ownership interest in [facility]provided to:: Patient   Expected Discharge Plan and Services Post Acute Care Choice: Skilled Nursing Facility Living arrangements for the past 2 months: Single Family Home        DME Arranged: N/A DME Agency: NA   Prior Living Arrangements/Services Living arrangements for the past 2 months: Single Family Home Lives with:: Self Do you feel safe going back to the place where you live?: Yes (after short-term rehab)      Current home services: DME  Activities of Daily Living   ADL Screening (condition at time of admission) Independently performs ADLs?: No Does the patient have a NEW difficulty with bathing/dressing/toileting/self-feeding that is expected to last >3 days?: Yes (Initiates electronic notice to provider for possible OT consult) Does the patient have a NEW difficulty with getting in/out of bed, walking, or climbing stairs that is expected to last >3 days?: Yes (Initiates electronic notice to provider for possible PT consult) Does the patient have a NEW difficulty with communication that is expected to last >3 days?: No Is the patient deaf or have difficulty hearing?: No Does the patient have difficulty seeing, even when wearing glasses/contacts?: No Does the patient have difficulty concentrating, remembering, or making decisions?: No  Permission Sought/Granted  Emotional Assessment Appearance:: Appears stated age Attitude/Demeanor/Rapport: Engaged Affect (typically observed): Accepting, Stable Orientation: : Oriented to Self, Oriented to Place, Oriented to  Time, Oriented to Situation  Admission diagnosis:  Subdural  hematoma (HCC) [S06.5XAA] Patient Active Problem List   Diagnosis Date Noted   Subdural hematoma (HCC) 01/22/2024   Inguinal bulge 11/26/2023   S/P lumbar fusion 10/01/2023   Constipation 05/12/2018   History of  colonic polyps 05/12/2018   Ascending aortic aneurysm 11/27/2015   DLBCL (diffuse large B cell lymphoma) (HCC) 07/17/2015   Cannabis abuse 08/10/2012   Depressive disorder, not elsewhere classified 08/10/2012   Obsessive-compulsive disorder 08/10/2012   Adjustment disorder with mixed anxiety and depressed mood 06/04/2012   PCP:  Maree Isles, MD Pharmacy:   [facility] - [place], [place] - [address] [place] [address] Phone: [phone] Fax: [phone]  Social Drivers of Health (SDOH) Social History: SDOH Screenings   Food Insecurity: No Food Insecurity (01/22/2024)  Housing: Low Risk (01/22/2024)  Transportation Needs: No Transportation Needs (01/22/2024)  Utilities: Not At Risk (01/22/2024)  Depression (PHQ2-9): Low Risk (11/26/2023)  Social Connections: Unknown (01/22/2024)  Tobacco Use: Low Risk (01/25/2024)   SDOH Interventions:   Readmission Risk Interventions    01/25/2024    5:37 PM  Readmission Risk Prevention Plan  Transportation Screening Complete  PCP or Specialist Appt within 3-5 Days Complete  Social Work Consult for Recovery Care Planning/Counseling Complete  Palliative Care Screening Complete  Medication Review Oceanographer) Complete

## 2024-01-25 NOTE — Evaluation (Signed)
 Physical Therapy Evaluation Patient Details Name: Douglas Bryant MRN: [id number] DOB: 02-12-1950 Today's Date: 01/25/2024  History of Present Illness  Douglas Bryant is a 73 y.o. male with medical history significant of hypertension, hypothyroidism, diffuse large B-cell lymphoma who presents to the emergency department due to several weeks of worsening abdominal pain with associated decreased oral intake and generalized weakness including 2  falls prior to presenting to [facility] ED on 01/18/2024 with a new abdominal mass which was concerning for malignancy and compatible with peritoneal carcinomatosis/lymphoma with moderate volume ascites.  He was diagnosed with acute kidney injury and UTI and discharged with Keflex.  He complained of worsening weakness with difficulty in being able to ambulate with a walker and complain of nausea without vomiting.  He has an appointment with oncologist on 12/31, but patient does not think he can wait till 31st, so he presents to the ED for further evaluation.   Clinical Impression  Patient demonstrates slow labored movement for sitting up at bedside, had most difficulty completing sit to stands due to BLE weakness, once standing limited to a few side steps before having to sit due to fall risk. Patient tolerated sitting up in chair after therapy with family member present. Patient will benefit from continued skilled physical therapy in hospital and recommended venue below to increase strength, balance, endurance for safe ADLs and gait.           If plan is discharge home, recommend the following: A lot of help with bathing/dressing/bathroom;A lot of help with walking and/or transfers;Help with stairs or ramp for entrance;Assistance with cooking/housework;Assist for transportation   Can travel by private vehicle   No    Equipment Recommendations None recommended by PT  Recommendations for Other Services       Functional Status Assessment Patient has had  a recent decline in their functional status and demonstrates the ability to make significant improvements in function in a reasonable and predictable amount of time.     Precautions / Restrictions Precautions Precautions: Fall Recall of Precautions/Restrictions: Intact Restrictions Weight Bearing Restrictions Per Provider Order: No      Mobility  Bed Mobility Overal bed mobility: Needs Assistance Bed Mobility: Supine to Sit     Supine to sit: Min assist, Mod assist     General bed mobility comments: increased time, labored movement    Transfers Overall transfer level: Needs assistance Equipment used: Rolling walker (2 wheels) Transfers: Sit to/from Stand, Bed to chair/wheelchair/BSC Sit to Stand: Max assist, +2 physical assistance   Step pivot transfers: Max assist       General transfer comment: most diffiuclty completing sit to stand due to BLE weakness    Ambulation/Gait Ambulation/Gait assistance: Max assist Gait Distance (Feet): 4 Feet Assistive device: Rolling walker (2 wheels) Gait Pattern/deviations: Decreased step length - right, Decreased step length - left, Decreased stride length, Knees buckling Gait velocity: slow     General Gait Details: limited to a few unsteady labored side steps before having to sit due to weakness  Stairs            Wheelchair Mobility     Tilt Bed    Modified Rankin (Stroke Patients Only)       Balance Overall balance assessment: Needs assistance Sitting-balance support: No upper extremity supported, Feet supported Sitting balance-Leahy Scale: Fair Sitting balance - Comments: seated at EOB   Standing balance support: Bilateral upper extremity supported, During functional activity, Reliant on assistive device for balance Standing balance-Leahy  Scale: Poor Standing balance comment: using RW                             Pertinent Vitals/Pain Pain Assessment Pain Assessment: Faces Faces Pain  Scale: Hurts even more Pain Location: General Pain Descriptors / Indicators: Discomfort Pain Intervention(s): Limited activity within patient's tolerance, Monitored during session, Repositioned    Home Living Family/patient expects to be discharged to:: Private residence Living Arrangements: Alone Available Help at Discharge: Family;Available PRN/intermittently Type of Home: House Home Access: Stairs to enter Entrance Stairs-Rails: Right Entrance Stairs-Number of Steps: 3   Home Layout: Two level;Able to live on main level with bedroom/bathroom Home Equipment: Rolling Walker (2 wheels);Cane - single point;Shower seat;Grab bars - tub/shower;Adaptive equipment      Prior Function Prior Level of Function : Needs assist;History of Falls (last six months)       Physical Assist : ADLs (physical);Mobility (physical) Mobility (physical): Transfers;Gait   Mobility Comments: Pt has been ambulating with the RW but recently struggled with that. Driving some. ADLs Comments: Independent prior to recent issues.     Extremity/Trunk Assessment   Upper Extremity Assessment Upper Extremity Assessment: Defer to OT evaluation    Lower Extremity Assessment Lower Extremity Assessment: Generalized weakness    Cervical / Trunk Assessment Cervical / Trunk Assessment: Kyphotic  Communication   Communication Communication: No apparent difficulties    Cognition Arousal: Alert Behavior During Therapy: WFL for tasks assessed/performed                             Following commands: Intact       Cueing Cueing Techniques: Verbal cues, Tactile cues     General Comments      Exercises     Assessment/Plan    PT Assessment Patient needs continued PT services  PT Problem List Decreased strength;Decreased activity tolerance;Decreased balance;Decreased mobility       PT Treatment Interventions DME instruction;Gait training;Stair training;Functional mobility  training;Therapeutic activities;Therapeutic exercise;Balance training;Patient/family education    PT Goals (Current goals can be found in the Care Plan section)  Acute Rehab PT Goals Patient Stated Goal: return home after rehab PT Goal Formulation: With patient/family Time For Goal Achievement: 02/08/24 Potential to Achieve Goals: Good    Frequency Min 3X/week     Co-evaluation PT/OT/SLP Co-Evaluation/Treatment: Yes Reason for Co-Treatment: To address functional/ADL transfers PT goals addressed during session: Mobility/safety with mobility;Balance;Proper use of DME OT goals addressed during session: ADL's and self-care       AM-PAC PT 6 Clicks Mobility  Outcome Measure Help needed turning from your back to your side while in a flat bed without using bedrails?: A Little Help needed moving from lying on your back to sitting on the side of a flat bed without using bedrails?: A Lot Help needed moving to and from a bed to a chair (including a wheelchair)?: A Lot Help needed standing up from a chair using your arms (e.g., wheelchair or bedside chair)?: A Lot Help needed to walk in hospital room?: A Lot Help needed climbing 3-5 steps with a railing? : Total 6 Click Score: 12    End of Session   Activity Tolerance: Patient tolerated treatment well;Patient limited by fatigue Patient left: in chair;with call bell/phone within reach;with family/visitor present Nurse Communication: Mobility status PT Visit Diagnosis: Unsteadiness on feet (R26.81);Other abnormalities of gait and mobility (R26.89);Muscle weakness (generalized) (M62.81)  Time: 8991-8974 PT Time Calculation (min) (ACUTE ONLY): 17 min   Charges:   PT Evaluation $PT Eval Low Complexity: 1 Low PT Treatments $Therapeutic Activity: 8-22 mins PT General Charges $$ ACUTE PT VISIT: 1 Visit         4:01 PM, 01/25/2024 Lynwood Music, MPT Physical Therapist with [facility] [phone]  office [phone] mobile phone

## 2024-01-25 NOTE — Consult Note (Signed)
 [facility] Consultation Hematology/Oncology  CONSULTING PHYSICIAN:  Adron Fairly, DO  REASON FOR CONSULT:  Possible lymphoma    HISTORY OF PRESENT ILLNESS:   Douglas Bryant is a 73 y.o. 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  Patient was last seen in hematology-oncology clinic on 11/26/2023 doing well at that time.  Labs at that time were within normal limits.  Concern for inguinal hernia was followed up with an ultrasound that was unrevealing except for a small cyst.  Patient underwent lumbar fusion on 10/02/2023 and recovered from it.  Patient stated that prior to the first week of December he was doing very well and was at his baseline function.  He had 2 falls in the first week of December followed by left knee injury and visit to the ER.  Patient since then had worsening abdominal pain along with some night sweats and loss of appetite with loss of weight.  He has no fevers or chills reported during this.  Patient went to the ER on 01/18/2024 again for abdominal pain and at this time had a CT scan done that showed Large ill-defined mass in the right mid abdomen which abuts the 2nd/3rd portions of the duodenum, multiple loops of distal small bowel in the right colon, and measures up to approximately 10.3-6 0.9 to 7.7 cm.  There was also diffused soft tissue nodularity in the omentum and areas of peritoneal thickening.  Multiple enlarged upper abdominal/retroperitoneal and mesenteric lymph nodes were present.  Patient was then discharged to follow-up with primary care.   Patient came to the ER on 01/22/2024 with worsening abdominal pain, loss of weight, global weakness and falls.  He was admitted this time for it.  Repeat CT scan showed the mesenteric mass again.  Heme-onc consulted for the same.  Patient was accompanied by his son at bedside today.  He reported significant abdominal distention causing pressure like sensation,  loss of appetite.  Patient has not been eating well for the past 3 weeks and has been having gradual worsening of functional status.  He denies any other complaints today.      MEDICATIONS: I have reviewed the patient's current medications.     PERFORMANCE STATUS: The patient's performance status is 2 - Symptomatic, <50% confined to bed  PHYSICAL EXAM: Most Recent Vital Signs: Blood pressure (!) 134/103, pulse 92, temperature 97.8 F (36.6 C), temperature source Oral, resp. rate 12, height 6' 2024/02/27 m), weight 270 lb (122.5 kg), SpO2 91%.  GENERAL:alert, no distress and comfortable NECK: supple, thyroid  normal size, non-tender, without nodularity LYMPH:  no palpable lymphadenopathy in the cervical, axillary or inguinal LUNGS: clear to auscultation and percussion with normal breathing effort HEART: regular rate & rhythm and no murmurs and no lower extremity edema ABDOMEN: Distended, tender, bowel sounds present. Musculoskeletal:no cyanosis of digits and no clubbing  PSYCH: alert & oriented x 3 with fluent speech NEURO: no focal motor/sensory deficits    LABORATORY DATA:   Last CBC Lab Results  Component Value Date   WBC 30.0 (H) 01/24/2024   HGB 11.7 (L) 01/24/2024   HCT 34.0 (L) 01/24/2024   MCV 96.6 01/24/2024   MCH 33.2 01/24/2024   RDW 15.7 (H) 01/24/2024   PLT 286 01/24/2024     Last metabolic panel Lab Results  Component Value Date   GLUCOSE 73 01/25/2024   NA 135 01/25/2024   K 3.9 01/25/2024   CL 95 (  L) 01/25/2024   CO2 21 (L) 01/25/2024   BUN 60 (H) 01/25/2024   CREATININE 2.51 (H) 01/25/2024   GFRNONAA 26 (L) 01/25/2024   CALCIUM  8.8 (L) 01/25/2024   PHOS 4.0 01/23/2024   PROT 5.6 (L) 01/25/2024   ALBUMIN  3.7 01/25/2024   BILITOT 0.4 01/25/2024   ALKPHOS 45 01/25/2024   AST 71 (H) 01/25/2024   ALT 16 01/25/2024   ANIONGAP 19 (H) 01/25/2024     Latest Reference Range & Units 01/23/24 06:02  LDH 105 - 235 U/L >2,500 (H)  (H): Data is abnormally  high   Latest Reference Range & Units 01/23/24 06:02  Uric Acid, Serum 3.7 - 8.6 mg/dL >78.9 (H)  (H): Data is abnormally high  RADIOGRAPHY:  EXAM: CT ABDOMEN AND PELVIS WITHOUT CONTRAST 01/22/2024 06:05:34 PM   TECHNIQUE: CT of the abdomen and pelvis was performed without the administration of intravenous contrast. Multiplanar reformatted images are provided for review. Automated exposure control, iterative reconstruction, and/or weight-based adjustment of the mA/kV was utilized to reduce the radiation dose to as low as reasonably achievable.   COMPARISON: PET/CT examination of 11/28/2013.   CLINICAL HISTORY: Mass, worsening pain. *tracking code: Bo*. Lymphoma.   FINDINGS:   LOWER CHEST: The lung bases are clear. Scattered atelectasis. Extensive coronary artery calcifications.   LIVER: The liver is unremarkable.   GALLBLADDER AND BILE DUCTS: Gallbladder is unremarkable. No biliary ductal dilatation.   SPLEEN: The spleen is of normal size and is unremarkable in this noncontrast examination.   PANCREAS: The head and uncinate process of the pancreas are involved by a large, infiltrative soft tissue mass centered within the right paraduodenal space, measuring at least 6.4 x 14.6 cm. The mass encases the distal second and proximal third portion of the duodenum, encases the proximal ascending colon, extending inferiorly to the level of the ileocecal junction, and is indistinct from the head and uncinate process of the pancreas. Despite complete encasement of the second portion of the duodenum, this structure is nonobstructed. Similarly, the ascending colon demonstrates no evidence of obstruction. Among the differential considerations, lymphoma should be strongly considered. Additional considerations could include a primary colonic malignancy, or less likely, a primary pancreatic malignancy. The body and tail of the pancreas are unremarkable.   ADRENAL GLANDS: No  acute abnormality.   KIDNEYS, URETERS AND BLADDER: No stones in the kidneys or ureters. No hydronephrosis. No perinephric or periureteral stranding. Urinary bladder is unremarkable.   GI AND BOWEL: Stomach demonstrates no acute abnormality. The distal second and proximal third portion of the duodenum and the proximal ascending colon are encased by the previously described mass, extending inferiorly to the level of the ileocecal junction. Despite complete encasement of the second portion of the duodenum, this structure is nonobstructed. Similarly, the ascending colon demonstrates no evidence of obstruction. There is no bowel obstruction.   PERITONEUM AND RETROPERITONEUM: Extensive omental caking and peritoneal thickening, best appreciated inferiorly, in keeping with peritoneal carcinomatosis. Large volume ascites. No free air.   VASCULATURE: Aorta is normal in caliber. Mild aortoiliac atherosclerotic calcification. No aortic aneurysm.   LYMPH NODES: Pathologic mesenteric and celiac adenopathy appears confluent with the primary mass. Shotty retroperitoneal adenopathy is present within the aortocaval and periaortic lymph node groups.   REPRODUCTIVE ORGANS: The prostate gland is mildly enlarged.   BONES AND SOFT TISSUES: Surgical changes of L3-4 anterior and posterior lumbar fusion with instrumentation are noted. No suspicious lytic or blastic bone lesion. Stable lytic lesion within the right ilium, likely  benign given stability over time. Small fat and fluid containing left inguinal hernia.   IMPRESSION: 1. Large infiltrative right paraduodenal space mass (at least 6.4 x 14.6 cm) encasing the distal second and proximal third duodenum and proximal ascending colon, and indistinguishable from the pancreatic head/uncinate process, without bowel obstruction; among the differential considerations, lymphoma is favored, with additional considerations less likely including primary  colonic malignancy or primary pancreatic malignancy. PET CT is recommended for further evaluation and to guide biopsy strategy. 2. Extensive omental caking and peritoneal thickening consistent with peritoneal carcinomatosis, with large volume ascites. 3. Pathologic mesenteric and celiac adenopathy confluent with the primary mass, with additional shotty retroperitoneal adenopathy. 4. Extensive coronary artery calcifications. 5. Postsurgical changes of L3-4 anterior and posterior lumbar fusion with instrumentation. 6. Stable lytic lesion in the right ilium, likely benign given stability over time. 7. Mild prostatomegaly. 8. Small fat and fluid containing left inguinal hernia. 9. RAF score includes Aortic atherosclerosis (ICD10-I70.0).   Electronically signed by: Dorethia Molt MD 01/22/2024 07:29 PM EST RP Workstation: [id number]  CT Head Wo Contrast EXAM: CT HEAD WITHOUT CONTRAST 01/23/2024 12:22:11 AM  TECHNIQUE: CT of the head was performed without the administration of intravenous contrast. Automated exposure control, iterative reconstruction, and/or weight based adjustment of the mA/kV was utilized to reduce the radiation dose to as low as reasonably achievable.  COMPARISON: CT head 01/22/2024.  CLINICAL HISTORY: Epidural. Repeat in 6 hours from initial scan  FINDINGS:  BRAIN AND VENTRICLES: Unchanged mildly hyperdense extra-axial 7 mm thick collection along the left frontal convexity, probably epidural. No evidence of acute infarct. No hydrocephalus. No midline shift.  ORBITS: No acute abnormality.  SINUSES: No acute abnormality.  SOFT TISSUES AND SKULL: No skull fracture.  IMPRESSION: 1. Unchanged 7 mm thick extra-axial (probably epidural) hemorrhage along the left frontal convexity.  Electronically signed by: Gilmore Molt MD 01/23/2024 12:41 AM EST RP Workstation: [id number]        ASSESSMENT:   Patient is a 73 year old male with past medical  history of DLBCL s/p 6 cycles of R-CHOP and in remission for 10 years, now presenting with a new mesenteric mass.  Hematology/oncology consulted for the same.  PLAN:   1.  New mesenteric mass Differential to include lymphoma versus another malignancy  - Will request IR for biopsy for diagnosis. - Please obtain a CT chest for complete staging purposes.  2.  Leukocytosis. Differential to include recurrence of lymphoma with bone marrow involvement versus leukemia [secondary to previous chemotherapy] Peripheral smear reviewed: Normal-looking RBC, occasional big platelets.  Increased white blood cell count seen with neutrophil predominance, occasional blasts with more immature cells seen.  -Flow cytometry pending at this time. - Please trend TLS labs: CBC with differential, CMP, uric acid, LDH, phosphorus every 12 hours - Will await flow cytometry results at this time.  3.  Abdominal distention Likely secondary to possible lymphoma  -Agree with paracentesis.  Please make sure to send flow cytometry and cytology on the paracentesis specimen.  4.  Hyperuricemia - Continue allopurinol  at this time.  Hematology will closely follow.  Please to let me know if questions or concerns.   Thank you for involving us  in this patient's care.  Please to reach out with any questions or concerns.  Mickiel Dry, MD Hematology/Oncology [facility]

## 2024-01-25 NOTE — Progress Notes (Signed)
 Mobility Specialist Progress Note:    01/25/24 1055  Mobility  Activity Pivoted/transferred from chair to bed  Level of Assistance Maximum assist, patient does 25-49% (+2)  Assistive Device Front wheel walker  Distance Ambulated (ft) 2 ft  Range of Motion/Exercises Active;All extremities  Activity Response Tolerated well  Mobility Referral Yes  Mobility visit 1 Mobility  Mobility Specialist Start Time (ACUTE ONLY) 1055  Mobility Specialist Stop Time (ACUTE ONLY) 1115  Mobility Specialist Time Calculation (min) (ACUTE ONLY) 20 min   Pt received in chair, requesting assistance to bed. Required MaxA+2 to stand and pivot with RW. Tolerated well, BLE weakness. Family and NT in room, all needs met.  [place]ene [place] Mobility Specialist Please contact via Special Educational Needs Teacher or  Rehab office at [phone]

## 2024-01-25 NOTE — Procedures (Signed)
 Ultrasound-guided diagnostic and therapeutic paracentesis performed yielding 5.5 liters of hazy tan colored fluid.  Fluid was sent to lab for analysis. No immediate complications. EBL is none.

## 2024-01-25 NOTE — Plan of Care (Signed)
" °  Problem: Acute Rehab OT Goals (only OT should resolve) Goal: Pt. Will Perform Grooming Flowsheets (Taken 01/25/2024 1336) Pt Will Perform Grooming:  standing  with contact guard assist  with min assist Goal: Pt. Will Perform Lower Body Bathing Flowsheets (Taken 01/25/2024 1336) Pt Will Perform Lower Body Bathing:  with modified independence  sitting/lateral leans  with adaptive equipment Goal: Pt. Will Perform Lower Body Dressing Flowsheets (Taken 01/25/2024 1336) Pt Will Perform Lower Body Dressing:  with modified independence  with adaptive equipment  sitting/lateral leans Goal: Pt. Will Transfer To Toilet Flowsheets (Taken 01/25/2024 1336) Pt Will Transfer to Toilet:  with min assist  stand pivot transfer Goal: Pt. Will Perform Toileting-Clothing Manipulation Flowsheets (Taken 01/25/2024 1336) Pt Will Perform Toileting - Clothing Manipulation and hygiene:  with contact guard assist  with min assist  sitting/lateral leans  sit to/from stand  bed level Goal: Pt/Caregiver Will Perform Home Exercise Program Flowsheets (Taken 01/25/2024 1336) Pt/caregiver will Perform Home Exercise Program:  Increased strength  Both right and left upper extremity  Independently  Elmond Poehlman OT, MOT  "

## 2024-01-26 ENCOUNTER — Ambulatory Visit (HOSPITAL_COMMUNITY): Payer: Medicare (Managed Care)

## 2024-01-26 DIAGNOSIS — Z7189 Other specified counseling: Secondary | ICD-10-CM

## 2024-01-26 DIAGNOSIS — D72829 Elevated white blood cell count, unspecified: Secondary | ICD-10-CM | POA: Insufficient documentation

## 2024-01-26 DIAGNOSIS — R6889 Other general symptoms and signs: Secondary | ICD-10-CM | POA: Diagnosis not present

## 2024-01-26 DIAGNOSIS — Z515 Encounter for palliative care: Secondary | ICD-10-CM

## 2024-01-26 DIAGNOSIS — N179 Acute kidney failure, unspecified: Secondary | ICD-10-CM | POA: Diagnosis not present

## 2024-01-26 DIAGNOSIS — C8370 Burkitt lymphoma, unspecified site: Secondary | ICD-10-CM | POA: Diagnosis not present

## 2024-01-26 HISTORY — PX: IR BONE MARROW BIOPSY & ASPIRATION: IMG5727

## 2024-01-26 LAB — COMPREHENSIVE METABOLIC PANEL WITH GFR
ALT: 14 U/L (ref 0–44)
AST: 67 U/L — ABNORMAL HIGH (ref 15–41)
Albumin: 4 g/dL (ref 3.5–5.0)
Alkaline Phosphatase: 42 U/L (ref 38–126)
Anion gap: 27 — ABNORMAL HIGH (ref 5–15)
BUN: 72 mg/dL — ABNORMAL HIGH (ref 8–23)
CO2: 14 mmol/L — ABNORMAL LOW (ref 22–32)
Calcium: 9 mg/dL (ref 8.9–10.3)
Chloride: 96 mmol/L — ABNORMAL LOW (ref 98–111)
Creatinine, Ser: 3.33 mg/dL — ABNORMAL HIGH (ref 0.61–1.24)
GFR, Estimated: 19 mL/min — ABNORMAL LOW
Glucose, Bld: 37 mg/dL — CL (ref 70–99)
Potassium: 4.6 mmol/L (ref 3.5–5.1)
Sodium: 136 mmol/L (ref 135–145)
Total Bilirubin: 0.5 mg/dL (ref 0.0–1.2)
Total Protein: 5.7 g/dL — ABNORMAL LOW (ref 6.5–8.1)

## 2024-01-26 LAB — GLUCOSE, CAPILLARY
Glucose-Capillary: 111 mg/dL — ABNORMAL HIGH (ref 70–99)
Glucose-Capillary: 115 mg/dL — ABNORMAL HIGH (ref 70–99)
Glucose-Capillary: 149 mg/dL — ABNORMAL HIGH (ref 70–99)
Glucose-Capillary: 161 mg/dL — ABNORMAL HIGH (ref 70–99)
Glucose-Capillary: 40 mg/dL — CL (ref 70–99)
Glucose-Capillary: 47 mg/dL — ABNORMAL LOW (ref 70–99)
Glucose-Capillary: 64 mg/dL — ABNORMAL LOW (ref 70–99)
Glucose-Capillary: 69 mg/dL — ABNORMAL LOW (ref 70–99)
Glucose-Capillary: 78 mg/dL (ref 70–99)
Glucose-Capillary: 80 mg/dL (ref 70–99)
Glucose-Capillary: 80 mg/dL (ref 70–99)
Glucose-Capillary: 86 mg/dL (ref 70–99)
Glucose-Capillary: 91 mg/dL (ref 70–99)

## 2024-01-26 LAB — CBC WITH DIFFERENTIAL/PLATELET
Abs Immature Granulocytes: 4.86 K/uL — ABNORMAL HIGH (ref 0.00–0.07)
Basophils Absolute: 0.2 K/uL — ABNORMAL HIGH (ref 0.0–0.1)
Basophils Relative: 0 %
Eosinophils Absolute: 0.4 K/uL (ref 0.0–0.5)
Eosinophils Relative: 1 %
HCT: 36.4 % — ABNORMAL LOW (ref 39.0–52.0)
Hemoglobin: 12.1 g/dL — ABNORMAL LOW (ref 13.0–17.0)
Immature Granulocytes: 13 %
Lymphocytes Relative: 18 %
Lymphs Abs: 7.1 K/uL — ABNORMAL HIGH (ref 0.7–4.0)
MCH: 33.2 pg (ref 26.0–34.0)
MCHC: 33.2 g/dL (ref 30.0–36.0)
MCV: 99.7 fL (ref 80.0–100.0)
Monocytes Absolute: 5.2 K/uL — ABNORMAL HIGH (ref 0.1–1.0)
Monocytes Relative: 14 %
Neutro Abs: 20.9 K/uL — ABNORMAL HIGH (ref 1.7–7.7)
Neutrophils Relative %: 54 %
Platelets: 231 K/uL (ref 150–400)
RBC: 3.65 MIL/uL — ABNORMAL LOW (ref 4.22–5.81)
RDW: 15.9 % — ABNORMAL HIGH (ref 11.5–15.5)
Smear Review: NORMAL
WBC: 38.6 K/uL — ABNORMAL HIGH (ref 4.0–10.5)
nRBC: 0.7 % — ABNORMAL HIGH (ref 0.0–0.2)

## 2024-01-26 LAB — URIC ACID: Uric Acid, Serum: >21 mg/dL — ABNORMAL HIGH (ref 3.7–8.6)

## 2024-01-26 LAB — TRIGLYCERIDES, BODY FLUIDS: Triglycerides, Fluid: 46 mg/dL

## 2024-01-26 LAB — SURGICAL PATHOLOGY

## 2024-01-26 LAB — GLUCOSE, BODY FLUID OTHER: Glucose, Body Fluid Other: <2 mg/dL

## 2024-01-26 LAB — PHOSPHORUS: Phosphorus: 4.1 mg/dL (ref 2.5–4.6)

## 2024-01-26 LAB — PROTEIN, BODY FLUID (OTHER): Total Protein, Body Fluid Other: 3.7 g/dL

## 2024-01-26 LAB — LACTATE DEHYDROGENASE: LDH: >2500 U/L — ABNORMAL HIGH (ref 105–235)

## 2024-01-26 MED ORDER — DEXTROSE 50 % IV SOLN
INTRAVENOUS | Status: AC
  Filled 2024-01-26: qty 50

## 2024-01-26 MED ORDER — DEXTROSE 50 % IV SOLN
INTRAVENOUS | Status: AC | PRN
  Administered 2024-01-26: 1 via INTRAVENOUS

## 2024-01-26 MED ORDER — SENNOSIDES-DOCUSATE SODIUM 8.6-50 MG PO TABS: 1.0000 | ORAL_TABLET | Freq: Every evening | ORAL | Status: DC | PRN

## 2024-01-26 MED ORDER — CHLORHEXIDINE GLUCONATE CLOTH 2 % EX PADS
6.0000 | MEDICATED_PAD | Freq: Every day | CUTANEOUS | Status: DC
  Administered 2024-01-26: 6 via TOPICAL

## 2024-01-26 MED ORDER — HYDRALAZINE HCL 20 MG/ML IJ SOLN: 10.0000 mg | INTRAMUSCULAR | Status: DC | PRN

## 2024-01-26 MED ORDER — MIDODRINE HCL 5 MG PO TABS
5.0000 mg | ORAL_TABLET | Freq: Three times a day (TID) | ORAL | Status: DC
  Administered 2024-01-26 – 2024-01-27 (×4): 5 mg via ORAL
  Filled 2024-01-26 (×4): qty 1

## 2024-01-26 MED ORDER — IPRATROPIUM-ALBUTEROL 0.5-2.5 (3) MG/3ML IN SOLN: 3.0000 mL | RESPIRATORY_TRACT | Status: DC | PRN

## 2024-01-26 MED ORDER — THIAMINE HCL 100 MG/ML IJ SOLN
100.0000 mg | Freq: Every day | INTRAMUSCULAR | Status: DC
  Administered 2024-01-26 – 2024-01-27 (×2): 100 mg via INTRAVENOUS
  Filled 2024-01-26 (×2): qty 2

## 2024-01-26 MED ORDER — LIDOCAINE HCL 1 % IJ SOLN
INTRAMUSCULAR | Status: AC
  Filled 2024-01-26: qty 20

## 2024-01-26 MED ORDER — DEXTROSE 50 % IV SOLN
1.0000 | INTRAVENOUS | Status: DC | PRN
  Administered 2024-01-26: 50 mL via INTRAVENOUS

## 2024-01-26 MED ORDER — GLUCAGON HCL RDNA (DIAGNOSTIC) 1 MG IJ SOLR
1.0000 mg | INTRAMUSCULAR | Status: AC | PRN
  Administered 2024-01-26: 1 mg via INTRAVENOUS
  Filled 2024-01-26: qty 1

## 2024-01-26 MED ORDER — DEXTROSE 50 % IV SOLN
1.0000 | Freq: Once | INTRAVENOUS | Status: AC
  Administered 2024-01-26: 50 mL via INTRAVENOUS

## 2024-01-26 MED ORDER — LIDOCAINE HCL 1 % IJ SOLN
20.0000 mL | Freq: Once | INTRAMUSCULAR | Status: AC
  Administered 2024-01-26: 10 mL via INTRADERMAL

## 2024-01-26 MED ORDER — METOPROLOL TARTRATE 5 MG/5ML IV SOLN: 5.0000 mg | INTRAVENOUS | Status: DC | PRN

## 2024-01-26 MED ORDER — SODIUM BICARBONATE 8.4 % IV SOLN
50.0000 meq | Freq: Three times a day (TID) | INTRAVENOUS | Status: AC
  Administered 2024-01-26 – 2024-01-27 (×3): 50 meq via INTRAVENOUS
  Filled 2024-01-26 (×2): qty 50

## 2024-01-26 MED ORDER — SENNA 8.6 MG PO TABS
2.0000 | ORAL_TABLET | Freq: Every day | ORAL | Status: DC
  Administered 2024-01-26 – 2024-01-27 (×2): 17.2 mg via ORAL
  Filled 2024-01-26 (×2): qty 2

## 2024-01-26 MED ORDER — DEXTROSE-SODIUM CHLORIDE 5-0.9 % IV SOLN: INTRAVENOUS | Status: DC

## 2024-01-26 MED ORDER — ONDANSETRON HCL 4 MG/2ML IJ SOLN: 4.0000 mg | Freq: Four times a day (QID) | INTRAMUSCULAR | Status: DC | PRN

## 2024-01-26 MED ORDER — GUAIFENESIN 100 MG/5ML PO LIQD: 5.0000 mL | ORAL | Status: DC | PRN

## 2024-01-26 MED ORDER — LORAZEPAM 0.5 MG PO TABS: 0.5000 mg | ORAL_TABLET | Freq: Every day | ORAL | Status: DC

## 2024-01-26 MED ORDER — FENTANYL CITRATE (PF) 100 MCG/2ML IJ SOLN
INTRAMUSCULAR | Status: AC
  Filled 2024-01-26: qty 2

## 2024-01-26 MED ORDER — SODIUM CHLORIDE 0.9 % IV SOLN
3.0000 mg | Freq: Once | INTRAVENOUS | Status: AC
  Administered 2024-01-26: 3 mg via INTRAVENOUS
  Filled 2024-01-26: qty 2

## 2024-01-26 MED ORDER — LORAZEPAM 0.5 MG PO TABS
0.5000 mg | ORAL_TABLET | Freq: Every day | ORAL | Status: DC
  Administered 2024-01-26 – 2024-01-27 (×2): 0.5 mg via ORAL
  Filled 2024-01-26 (×2): qty 1

## 2024-01-26 NOTE — NC FL2 (Signed)
 " [place]  MEDICAID FL2 LEVEL OF CARE FORM     IDENTIFICATION  Patient Name: Douglas Bryant Birthdate: 30-Dec-1950 Sex: male Admission Date (Current Location): 01/22/2024  [facility] and [place] Number:  [employer] and Address:  [facility],  [address], [place] [address]      Provider Number: [id number]  Attending Physician Name and Address:  Caleen Burgess BROCKS, MD  Relative Name and Phone Number:       Current Level of Care: Hospital Recommended Level of Care: Skilled Nursing Facility Prior Approval Number:    Date Approved/Denied:   PASRR Number: [id number]  Discharge Plan: SNF    Current Diagnoses: Patient Active Problem List   Diagnosis Date Noted   Subdural hematoma (HCC) 01/22/2024   Inguinal bulge 11/26/2023   S/P lumbar fusion 10/01/2023   Constipation 05/12/2018   History of colonic polyps 05/12/2018   Ascending aortic aneurysm 11/27/2015   DLBCL (diffuse large B cell lymphoma) (HCC) 07/17/2015   Cannabis abuse 08/10/2012   Depressive disorder, not elsewhere classified 08/10/2012   Obsessive-compulsive disorder 08/10/2012   Adjustment disorder with mixed anxiety and depressed mood 06/04/2012    Orientation RESPIRATION BLADDER Height & Weight     Self, Situation  Normal Continent Weight: 189 lb 13.1 oz (86.1 kg) Height:  6' 1 (185.4 cm)  BEHAVIORAL SYMPTOMS/MOOD NEUROLOGICAL BOWEL NUTRITION STATUS      Continent Diet  AMBULATORY STATUS COMMUNICATION OF NEEDS Skin   Extensive Assist Verbally Other (Comment) (Ecchymosis to bilateral buttocks and sacrum)                       Personal Care Assistance Level of Assistance  Bathing, Feeding, Dressing Bathing Assistance: Maximum assistance Feeding assistance: Limited assistance Dressing Assistance: Maximum assistance     Functional Limitations Info  Sight, Hearing, Speech Sight Info: Adequate Hearing Info: Adequate Speech Info: Adequate    SPECIAL CARE FACTORS  FREQUENCY  PT (By licensed PT), OT (By licensed OT)     PT Frequency: 5x/wk OT Frequency: 5x/wk            Contractures Contractures Info: Not present    Additional Factors Info  Code Status, Allergies, Psychotropic Code Status Info: DNR Allergies Info: Flomax (Tamsulosin Hcl) Psychotropic Info: Ativan          Current Medications (01/26/2024):  This is the current hospital active medication list Current Facility-Administered Medications  Medication Dose Route Frequency Provider Last Rate Last Admin   acetaminophen  (TYLENOL ) tablet 650 mg  650 mg Oral Q6H PRN Adefeso, Oladapo, DO   650 mg at 01/25/24 2108   Or   acetaminophen  (TYLENOL ) suppository 650 mg  650 mg Rectal Q6H PRN Adefeso, Oladapo, DO       allopurinol  (ZYLOPRIM ) tablet 300 mg  300 mg Oral Daily Maree, Pratik D, DO   300 mg at 01/25/24 0919   Chlorhexidine  Gluconate Cloth 2 % PADS 6 each  6 each Topical Daily Amin, Ankit C, MD       dextrose  5 %-0.9 % sodium chloride  infusion   Intravenous Continuous Adefeso, Oladapo, DO 50 mL/hr at 01/26/24 0730 New Bag at 01/26/24 0730   feeding supplement (ENSURE PLUS HIGH PROTEIN) liquid 237 mL  237 mL Oral BID BM Shah, Pratik D, DO   237 mL at 01/25/24 1522   glucagon  (human recombinant) (GLUCAGEN) injection 1 mg  1 mg Intravenous PRN Amin, Ankit C, MD       guaiFENesin (ROBITUSSIN) 100  MG/5ML liquid 5 mL  5 mL Oral Q4H PRN Amin, Ankit C, MD       hydrALAZINE (APRESOLINE) injection 10 mg  10 mg Intravenous Q4H PRN Amin, Ankit C, MD       ipratropium-albuterol (DUONEB) 0.5-2.5 (3) MG/3ML nebulizer solution 3 mL  3 mL Nebulization Q4H PRN Amin, Ankit C, MD       levothyroxine  (SYNTHROID ) tablet 50 mcg  50 mcg Oral Q0600 Adefeso, Oladapo, DO   50 mcg at 01/26/24 9375   LORazepam  (ATIVAN ) tablet 0.5 mg  0.5 mg Oral QHS Jonel Lonni SQUIBB, MD   0.5 mg at 01/26/24 0407   metoprolol  tartrate (LOPRESSOR ) injection 5 mg  5 mg Intravenous Q4H PRN Amin, Ankit C, MD       midodrine   (PROAMATINE ) tablet 5 mg  5 mg Oral TID WC Amin, Ankit C, MD       ondansetron  (ZOFRAN ) injection 4 mg  4 mg Intravenous Q6H PRN Amin, Ankit C, MD       oxyCODONE  (Oxy IR/ROXICODONE ) immediate release tablet 5 mg  5 mg Oral Q4H PRN Jonel Lonni SQUIBB, MD   5 mg at 01/25/24 2241   polyethylene glycol (MIRALAX  / GLYCOLAX ) packet 17 g  17 g Oral Daily Maree, Pratik D, DO   17 g at 01/25/24 0919   senna-docusate (Senokot-S) tablet 1 tablet  1 tablet Oral QHS PRN Amin, Ankit C, MD       sodium chloride  (OCEAN) 0.65 % nasal spray 1 spray  1 spray Each Nare PRN Jonel Lonni SQUIBB, MD   1 spray at 01/25/24 1705   sodium chloride  0.9 % bolus 1,000 mL  1,000 mL Intravenous Once Templeton, James H, PA-C       thiamine  (VITAMIN B1) injection 100 mg  100 mg Intravenous Daily Adefeso, Oladapo, DO         Discharge Medications: Please see discharge summary for a list of discharge medications.  Relevant Imaging Results:  Relevant Lab Results:   Additional Information SSN: [id number]  Hoy DELENA Bigness, LCSW     "

## 2024-01-26 NOTE — Plan of Care (Signed)
   Problem: Education: Goal: Knowledge of General Education information will improve Description Including pain rating scale, medication(s)/side effects and non-pharmacologic comfort measures Outcome: Progressing   Problem: Education: Goal: Knowledge of General Education information will improve Description Including pain rating scale, medication(s)/side effects and non-pharmacologic comfort measures Outcome: Progressing

## 2024-01-26 NOTE — Plan of Care (Signed)

## 2024-01-26 NOTE — Progress Notes (Signed)
" °  IR BRIEF PROGRESS NOTE:  Patient arrived to [facility] by transport from [facility]. Patient was hypoglycemic this Am at 47, and improved to 160's with D50 given. At [facility], current CBG 78. Re-evaluating with VIR, Dr. Juliene Balder, MD, and monitoring patient for any further decline in CBG. Will titrate with D5 @ 50.   Electronically Signed: Carlin DELENA Griffon, PA-C 01/26/2024, 9:24 AM      "

## 2024-01-26 NOTE — Progress Notes (Signed)
 Nurse at bedside,patient's Blood Capillary Glucose was 64 at this time,Dr Ankit Amin notified. One ampule of Dextrose  given intravenous per MD 's orders. Patient alert to person,place,time and situation at this time.Plan of care on going.

## 2024-01-26 NOTE — Sedation Documentation (Signed)
 D5 drip increased to 75 ml/hr per Kacie, Douglas Bryant

## 2024-01-26 NOTE — Progress Notes (Addendum)
 Nurse at bedside,patient alert to person,and situation,confused to time and place this morning.Blood pressure was 93/71,patient very sleepy and tired this am,hard for patient to stay awake and focus.Recheck Blood capillary glucose was 86 at this time.Dr Burgess Dare notified.Plan of care on going.

## 2024-01-26 NOTE — Progress Notes (Signed)
 Progress Note  RN called about lab reporting blood glucose this morning to be 37, CBG at bedside was done and it was 40.  Chart was reviewed, patient has abdominal mass probably lymphoma, he has epidural hematoma and starvation ketoacidosis.  He was on IV NS due to dehydration and hyponatremia.  Sodium level is corrected.  D50 was given, thiamine  (in case of underlying alcohol  history) and IV fluid was changed to D5 NS. Continue CBG monitoring and continue hypoglycemia protocol.  Physical Exam  BP (P) 126/75 (BP Location: Left Arm)   Pulse (P) 99   Temp (P) 98.8 F (37.1 C) (Oral)   Resp (P) 16   Ht 6' 1 (1.854 m)   Wt 86.1 kg   SpO2 (P) 94%   BMI 25.04 kg/m    Gen:- Awake, ill-appearing, weak and tired, but in no acute distress HEENT:- [place].AT, No sclera icterus Neck-Supple Neck,No JVD,.  Lungs-  CTAB  CV- S1, S2 normal Abd-  +ve B.Sounds, Abd Soft, No tenderness,    Extremity/Skin:- +2  edema B/L Psych-affect is appropriate Neuro-no new focal deficits, no tremors  Assessment and plan Hypoglycemia 1 ampoule of D50 was given IV NS was changed to IV D5 NS Continue to monitor CBG and follow-up of glycemia protocol  Starvation ketoacidosis Continue IV hydration as described above  Total time:  37 minutes This includes time reviewing the chart including progress notes, labs, EKGs, taking medical decisions, ordering labs and documenting findings.  Please refer to admission H&P, progress notes and consult notes for details regarding the care of this patient

## 2024-01-26 NOTE — Progress Notes (Signed)
 " PROGRESS NOTE    Douglas Bryant  FMW:[id number] DOB: Jun 17, 1950 DOA: 01/22/2024 PCP: Maree Isles, MD    Brief Narrative:   73 y.o. 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     Went to outside ED 12/22 and found to have new abdominal mass compatible with new lymphoma mass with ascites, as well as AKI and UTI.   Had pending appointment with oncology, but was too weak and falling, so came to the ER for further evaluation, where he was found to have again dehydration with AKI.  CT showed epidural hematoma.   Repeat CT head has remained stable.  Now awaiting paracentesis 12/29 by IR, as well as oncology and palliative care consultations.   Assessment & Plan:   Abdominal mass favoring lymphoma History of diffuse large B-cell lymphoma Abdominal distention with ascites/lymphoma Anemia Leukocytosis CT abdomen and pelvis shows a large mass in the periduodenal space.  Differential includes lymphoma versus primary pancreatic or colonic malignancy.  There is a concern for new mesenteric mass.  Discussed with IR, unable to biopsy given ascites.  Hematology team has been consulted. -Paracentesis, send out cytology.  Status post paracentesis 5.5 L removed 12/29.  Will give albumin     AKI Dehydration Baseline creatinine 0.8 in October 2025, continues to trend upwards.  Admission creatinine 2.9 > 3.33.  Getting IV fluids - Foley catheter insertion -Hopefully paracentesis will help decompress      Epidural hematoma Initial CT head showed 7 mm epidural hematoma case was discussed with neurosurgery Dr. Malcolm by previous provider who recommended repeating the study which was repeated on 12/27.  Repeat study is stable      Starvation ketoacidosis with intermittent hypoglycemia Getting D5 fluids.  As needed glucagon     Hyponatremia Resolved with IV fluids -Hold HCTZ - Monitor with fluids     Acquired hypothyroidism -Continue levothyroxine    Essential hypertension (controlled) Soft blood pressure -Hold home medication, midodrine  p.o.  DVT prophylaxis: SCDs Start: 01/22/24 2052      Code Status: Limited: Do not attempt resuscitation (DNR) -DNR-LIMITED -Do Not Intubate/DNI  Family Communication:   Status is: Inpatient Remains inpatient appropriate because: On going management for AKI   PT Follow up Recs: Skilled Nursing-Short Term Rehab (<3 Hours/Day)01/25/2024 1557  Subjective: Doing ok  Seen post procedure at [facility] Intermittent low BS   Examination:  General exam: Appears calm and comfortable ; appears chronically ill  Respiratory system: Clear to auscultation. Respiratory effort normal. Cardiovascular system: S1 & S2 heard, RRR. No JVD, murmurs, rubs, gallops or clicks. No pedal edema. Gastrointestinal system: Abdomen is nondistended, soft and nontender. No organomegaly or masses felt. Normal bowel sounds heard. Central nervous system: Alert and oriented. No focal neurological deficits. Extremities: Symmetric 5 x 5 power. Skin: No rashes, lesions or ulcers Psychiatry: Judgement and insight appear normal. Mood & affect appropriate.                Diet Orders (From admission, onward)     Start     Ordered   01/26/24 1240  Diet regular Room service appropriate? Yes; Fluid consistency: Thin  Diet effective now       Question Answer Comment  Room service appropriate? Yes   Fluid consistency: Thin      01/26/24 1239            Objective: Vitals:   01/26/24  9049 01/26/24 0957 01/26/24 1032 01/26/24 1152  BP: 117/79 118/72 108/64 105/71  Pulse: 98 97 95 97  Resp: 16 20 18 17   Temp:    (!) 97.5 F (36.4 C)  TempSrc:    Oral  SpO2: 95% 95% 93% 93%  Weight:      Height:        Intake/Output Summary (Last 24 hours) at 01/26/2024 1253 Last data filed at  01/25/2024 1802 Gross per 24 hour  Intake 109.57 ml  Output --  Net 109.57 ml   Filed Weights   01/22/24 1250 01/22/24 2054  Weight: 83.5 kg 86.1 kg    Scheduled Meds:  allopurinol   300 mg Oral Daily   Chlorhexidine  Gluconate Cloth  6 each Topical Daily   dextrose        feeding supplement  237 mL Oral BID BM   levothyroxine   50 mcg Oral Q0600   LORazepam   0.5 mg Oral QHS   midodrine   5 mg Oral TID WC   polyethylene glycol  17 g Oral Daily   sodium bicarbonate   50 mEq Intravenous Q8H   thiamine  (VITAMIN B1) injection  100 mg Intravenous Daily   Continuous Infusions:  dextrose  5 % and 0.9 % NaCl 50 mL/hr at 01/26/24 0730   sodium chloride       Nutritional status     Body mass index is 25.04 kg/m.  Data Reviewed:   CBC: Recent Labs  Lab 01/23/24 0602 01/24/24 0445 01/24/24 1302 01/25/24 1759 01/26/24 0416  WBC 32.5* 31.6* 30.0* 28.8* 38.6*  NEUTROABS  --  22.4* 21.0* 20.2* 20.9*  HGB 11.9* 11.8* 11.7* 11.0* 12.1*  HCT 35.4* 34.9* 34.0* 33.0* 36.4*  MCV 97.3 96.7 96.6 98.2 99.7  PLT 355 325 286 207 231   Basic Metabolic Panel: Recent Labs  Lab 01/23/24 0602 01/24/24 0445 01/25/24 0502 01/25/24 1759 01/26/24 0416  NA 131* 133* 135 136 136  K 4.6 3.9 3.9 4.2 4.6  CL 88* 92* 95* 95* 96*  CO2 23 21* 21* 15* 14*  GLUCOSE 71 68* 73 56* 37*  BUN 59* 59* 60* 66* 72*  CREATININE 3.00* 2.68* 2.51* 2.88* 3.33*  CALCIUM  9.1 8.9 8.8* 8.9 9.0  MG 2.6* 2.5* 2.5*  --   --   PHOS 4.0  --   --  3.4 4.1   GFR: Estimated Creatinine Clearance: 21.7 mL/min (A) (by C-G formula based on SCr of 3.33 mg/dL (H)). Liver Function Tests: Recent Labs  Lab 01/23/24 0602 01/24/24 0445 01/25/24 0502 01/25/24 1759 01/26/24 0416  AST 75* 75* 71* 64* 67*  ALT 18 17 16 14 14   ALKPHOS 51 46 45 40 42  BILITOT 0.3 0.4 0.4 0.6 0.5  PROT 6.0* 6.0* 5.6* 5.7* 5.7*  ALBUMIN  3.9 3.9 3.7 4.1 4.0   Recent Labs  Lab 01/22/24 1252  LIPASE 31   No results for input(s): AMMONIA  in the last 168 hours. Coagulation Profile: No results for input(s): INR, PROTIME in the last 168 hours. Cardiac Enzymes: Recent Labs  Lab 01/22/24 1252  CKTOTAL 224   BNP (last 3 results) No results for input(s): PROBNP in the last 8760 hours. HbA1C: No results for input(s): HGBA1C in the last 72 hours. CBG: Recent Labs  Lab 01/26/24 0814 01/26/24 0917 01/26/24 0934 01/26/24 1020 01/26/24 1236  GLUCAP 86 78 69* 115* 64*   Lipid Profile: No results for input(s): CHOL, HDL, LDLCALC, TRIG, CHOLHDL, LDLDIRECT in the last 72 hours. Thyroid  Function Tests: No results for  input(s): TSH, T4TOTAL, FREET4, T3FREE, THYROIDAB in the last 72 hours. Anemia Panel: No results for input(s): VITAMINB12, FOLATE, FERRITIN, TIBC, IRON, RETICCTPCT in the last 72 hours. Sepsis Labs: No results for input(s): PROCALCITON, LATICACIDVEN in the last 168 hours.  Recent Results (from the past 240 hours)  Culture, body fluid w Gram Stain-bottle     Status: None (Preliminary result)   Collection Time: 01/25/24  1:50 PM   Specimen: Ascitic  Result Value Ref Range Status   Specimen Description ASCITIC BOTTLES DRAWN AEROBIC AND ANAEROBIC  Final   Special Requests 10CC  Final   Culture   Final    NO GROWTH < 24 HOURS Performed at [facility], [address] [place], [place] [address]    Report Status PENDING  Incomplete  Gram stain     Status: None   Collection Time: 01/25/24  1:50 PM   Specimen: Ascitic  Result Value Ref Range Status   Specimen Description ASCITIC  Final   Special Requests ASCITIC  Final   Gram Stain   Final    ASCITIC CYTOSPIN SMEAR WBC PRESENT, PREDOMINANTLY MONONUCLEAR NO ORGANISMS SEEN Performed at [facility], [address], [place], [place] [address]    Report Status 01/25/2024 FINAL  Final         Radiology Studies: US  Paracentesis Result Date: 01/25/2024 INDICATION: 73 year old male. History of B-cell lymphoma.  Endorsing abdominal pain. Found to have ascites. Request is for therapeutic and diagnostic paracentesis. EXAM: ULTRASOUND GUIDED THERAPEUTIC AND DIAGNOSTIC PARACENTESIS MEDICATIONS: Lidocaine  1% 10 mL COMPLICATIONS: None immediate. PROCEDURE: Informed written consent was obtained from the patient after a discussion of the risks, benefits and alternatives to treatment. A timeout was performed prior to the initiation of the procedure. Initial ultrasound scanning demonstrates a moderate amount of ascites within the right lower abdominal quadrant. The right lower abdomen was prepped and draped in the usual sterile fashion. 1% lidocaine  was used for local anesthesia. Following this, a 19 gauge, 7-cm, Yueh catheter was introduced. An ultrasound image was saved for documentation purposes. The paracentesis was performed. The catheter was removed and a dressing was applied. The patient tolerated the procedure well without immediate post procedural complication. Patient received post-procedure intravenous albumin ; see nursing notes for details. FINDINGS: A total of approximately 5.5 L of hazy tan colored fluid was removed. Samples were sent to the laboratory as requested by the clinical team. IMPRESSION: Successful ultrasound-guided therapeutic and diagnostic paracentesis yielding 5.5 liters of hazy tan-colored peritoneal fluid. Performed by Delon Beagle NP Electronically Signed   By: Ester Sides M.D.   On: 01/25/2024 15:42           LOS: 4 days   Time spent= 35 mins    Burgess JAYSON Dare, MD [facility]  If 7PM-7AM, please contact night-coverage  01/26/2024, 12:53 PM  "

## 2024-01-26 NOTE — Sedation Documentation (Signed)
CBG: 115 

## 2024-01-26 NOTE — Hospital Course (Addendum)
 Brief Narrative:   73 y.o. 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     Went to outside ED 12/22 and found to have new abdominal mass compatible with new lymphoma mass with ascites, as well as AKI and UTI.   Had pending appointment with oncology, but was too weak and falling, so came to the ER for further evaluation, where he was found to have again dehydration with AKI.  CT showed epidural hematoma.   Repeat CT head has remained stable.  Now awaiting paracentesis 12/29 by IR, as well as oncology and palliative care consultations.   Assessment & Plan:   Abdominal mass favoring lymphoma History of diffuse large B-cell lymphoma Abdominal distention with ascites/lymphoma Anemia Leukocytosis CT abdomen and pelvis shows a large mass in the periduodenal space.  Differential includes lymphoma versus primary pancreatic or colonic malignancy.  There is a concern for new mesenteric mass.  Discussed with IR, unable to biopsy given ascites.  Hematology team has been consulted. -Paracentesis, send out cytology.  Status post paracentesis 5.5 L removed 12/29.  Will give albumin    AKI Dehydration Baseline creatinine 0.8 in October 2025, continues to trend upwards.  Admission creatinine 2.9 > 3.33.  Getting IV fluids - Foley catheter insertion -Hopefully paracentesis will help decompress      Epidural hematoma Initial CT head showed 7 mm epidural hematoma case was discussed with neurosurgery Dr. Malcolm by previous provider who recommended repeating the study which was repeated on 12/27.  Repeat study is stable      Starvation ketoacidosis with intermittent hypoglycemia Getting D5 fluids.  As needed glucagon    Hyponatremia Resolved with IV fluids -Hold HCTZ - Monitor with fluids     Acquired hypothyroidism -Continue levothyroxine     Essential hypertension (controlled) Soft blood pressure -Hold home medication, midodrine  p.o.  DVT prophylaxis: SCDs Start: 01/22/24 2052      Code Status: Limited: Do not attempt resuscitation (DNR) -DNR-LIMITED -Do Not Intubate/DNI  Family Communication:   Status is: Inpatient Remains inpatient appropriate because: On going management for AKI   PT Follow up Recs: Skilled Nursing-Short Term Rehab (<3 Hours/Day)01/25/2024 1557  Subjective: Doing ok  Seen post procedure at [facility] Intermittent low BS   Examination:  General exam: Appears calm and comfortable ; appears chronically ill  Respiratory system: Clear to auscultation. Respiratory effort normal. Cardiovascular system: S1 & S2 heard, RRR. No JVD, murmurs, rubs, gallops or clicks. No pedal edema. Gastrointestinal system: Abdomen is nondistended, soft and nontender. No organomegaly or masses felt. Normal bowel sounds heard. Central nervous system: Alert and oriented. No focal neurological deficits. Extremities: Symmetric 5 x 5 power. Skin: No rashes, lesions or ulcers Psychiatry: Judgement and insight appear normal. Mood & affect appropriate.

## 2024-01-26 NOTE — Progress Notes (Signed)
 Patient's Capillary Blood Glucose was 80 at this time,results reported to Dr Burgess Dare. Orders received to go ahead and give 1 mg of Glucagon  Intravenous  per MAR prn orders. Plan of care on going.

## 2024-01-26 NOTE — Progress Notes (Signed)
 Critical Lab - Glucose: 37 @0612   Rechecked with Glucometer: 40 @0615   Pt asymptomatic   MD Adefeso and Charge Nurse Rayfield Ned notified  Gave Grapejuice BS: 47  Administered D5 Dextrose  Injection BS: 161  Pt now states he feel nauseated. Elevated HOB 30, pt is sitting up with legs touching floor. Per MD's order hanging IV Dextrose . Communicated with day shift nurse, plan of care ongoing, will continue to monitor pt.

## 2024-01-26 NOTE — Progress Notes (Signed)
 Nurse at bedside,patient's blood pressure was 95/42,heart rate 88,recheck blood capillary glucose was 149,Dr Ankit Amin notified. Plan of care on going.

## 2024-01-26 NOTE — Consult Note (Signed)
 "                                                                                   Consultation Note Date: 01/26/2024   Patient Name: Douglas Bryant  DOB: 04-07-1950  MRN: [id number]  Age / Sex: 73 y.o., male  PCP: Maree Isles, MD Referring Physician: Caleen Burgess BROCKS, MD  Reason for Consultation: Establishing goals of care  HPI/Patient Profile: 73 y.o. 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 Recent discovery of new abdominal mass compatible with new lymphoma mass ascites, AKI, UTI on 12/22 at outside ED. Plans for follow up with oncology but required admission for dehydration and AKI as well as epidural hematoma. S/P paracentesis 12/29 5.5L. Plans for bone marrow biopsy 12/30.   Clinical Assessment and Goals of Care: Consult received and thorough chart review completed including notes, consultations, procedures, labs, and MAR. I met today at Mr. Pals's bedside along with Dr. Caleen, son Rankin, and sister Devere. Dr. Caleen is explaining to family the severity of Mr. Lara's illness and concern for worsening kidney function and overall decline. He explained uremia and declining status that can lead to multiorgan failure and end of life. Family at bedside are very understanding and see his decline.   Mr. Majka awakens and expresses his pain and discomfort following biopsy. He is uncomfortable in bed. RN notified to give pain medication. I assisted son to sit Mr. Kerman on side of the bed per his request. He was finally able to assist to pull himself up and support himself sitting up. He does become more sleepy after lying back down.   I discussed further with family goals of care. They share that he has been clear in his wishes. They understand that time may be very limited if he continues on this path. At this time they wish to continue  current interventions while awaiting pathology results. We discussed if he declines further in the meantime goals and they agree that escalation of care would not be his desire. No plans for escalation to ICU or for any invasive/aggressive intervention. We did discuss the possibility that pathology could come back as a lymphoma/leukemia that may respond quickly to treatment to return him to a level of quality of life but the further we go into this decline the less likely this is possible. Family understand. They are awaiting pathology results. Continue current care at this time. Comfort and symptom management is the priority of care.   I updated contact list. Sister Devere lives in [place] but is very supportive. They have another sister in [place] who Mr. Faraci lives with and is caregiver for (although they know this is no longer an option). He has 2 sons Valerie is here visiting from [place]). His ex-wife (sons mother who lives in [place]) is also very supportive and they are good friends. 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   All questions/concerns addressed. Emotional support provided. Updated Dr. Caleen.   Primary Decision Maker PATIENT son Rankin and sister Devere  SUMMARY OF RECOMMENDATIONS   - DNR confirmed - Continue current care - Awaiting biopsy results - No desire for escalation to ICU level care or invasive/aggressive interventions - Ongoing daily discussions  Code Status/Advance Care Planning: DNR   Symptom Management:  Per attending  Prognosis:  Poor  Discharge Planning: To Be Determined      Primary Diagnoses: Present on Admission:  Subdural hematoma (HCC)   I have reviewed the medical record, interviewed the patient and family, and examined the patient. The following aspects are pertinent.  Past Medical History:  Diagnosis Date   Anemia    Aneurysm, aorta, thoracic    Anxiety    Back pain    Constipation    Depression    DLBCL  (diffuse large B cell lymphoma) (HCC) 07/17/2015   Environmental allergies    History of blood transfusion    History of bronchitis    History of chemotherapy    Hypertension    Hypothyroidism    Marijuana use    Nocturia    Peripheral vascular disease    Urinary frequency    Social History   Socioeconomic History   Marital status: Divorced    Spouse name: Not on file   Number of children: Not on file   Years of education: Not on file   Highest education level: Not on file  Occupational History   Not on file  Tobacco Use   Smoking status: Never   Smokeless tobacco: Never  Vaping Use   Vaping status: Never Used  Substance and Sexual Activity   Alcohol  use: Yes    Alcohol /week: 6.0 standard drinks of alcohol     Types: 6 Cans of beer per week    Comment: occasional beer   Drug use: Yes    Frequency: 2.0 times per week    Types: Marijuana    Comment: smokes daily for thirty years. currently takes one puff   Sexual activity: Yes    Partners: Female    Birth control/protection: Condom  Other Topics Concern   Not on file  Social History Narrative   Not on file   Social Drivers of Health   Tobacco Use: Low Risk (01/25/2024)   Patient History    Smoking Tobacco Use: Never    Smokeless Tobacco Use: Never    Passive Exposure: Not on file  Financial Resource Strain: Not on file  Food Insecurity: No Food Insecurity (01/22/2024)   Epic    Worried About Programme Researcher, Broadcasting/film/video in the Last Year: Never true    Ran Out of Food in the Last Year: Never true  Transportation Needs: No Transportation Needs (01/22/2024)   Epic    Lack of Transportation (Medical): No    Lack of Transportation (Non-Medical): No  Physical Activity: Not on file  Stress: Not on file  Social Connections: Unknown (01/22/2024)   Social Connection and Isolation Panel    Frequency of Communication with Friends and Family: Three times a week    Frequency of Social Gatherings with Friends and Family: Twice a  week    Attends Religious Services: More than 4 times per year    Active Member of Clubs or Organizations: No    Attends Engineer, Structural: More than 4 times per year    Marital Status: Patient declined  Depression (PHQ2-9): Low Risk (11/26/2023)   Depression (PHQ2-9)    PHQ-2 Score: 0  Alcohol  Screen: Not on file  Housing: Low Risk (01/22/2024)   Epic  Unable to Pay for Housing in the Last Year: No    Number of Times Moved in the Last Year: 0    Homeless in the Last Year: No  Utilities: Not At Risk (01/22/2024)   Epic    Threatened with loss of utilities: No  Health Literacy: Not on file   Family History  Problem Relation Age of Onset   Alcohol  abuse Father    Depression Sister    Anxiety disorder Sister    Depression Sister    Anxiety disorder Sister    Scheduled Meds:  allopurinol   300 mg Oral Daily   feeding supplement  237 mL Oral BID BM   levothyroxine   50 mcg Oral Q0600   LORazepam   0.5 mg Oral QHS   polyethylene glycol  17 g Oral Daily   thiamine  (VITAMIN B1) injection  100 mg Intravenous Daily   Continuous Infusions:  dextrose  5 % and 0.9 % NaCl 50 mL/hr at 01/26/24 0730   sodium chloride      PRN Meds:.acetaminophen  **OR** acetaminophen , oxyCODONE , sodium chloride  Allergies[1] Review of Systems  Constitutional:  Positive for activity change, appetite change and fatigue.  Neurological:  Positive for weakness.    Physical Exam Vitals and nursing note reviewed.  Constitutional:      Appearance: He is ill-appearing.     Comments: Sleeping much of my visit  Cardiovascular:     Rate and Rhythm: Normal rate.  Pulmonary:     Effort: No tachypnea, accessory muscle usage or respiratory distress.  Abdominal:     General: There is distension.  Neurological:     Comments: Difficult to focus in conversation due to lethargy     Vital Signs: BP 93/71 (BP Location: Right Arm)   Pulse 96   Temp 97.7 F (36.5 C) (Axillary)   Resp 18   Ht 6' 1  (1.854 m)   Wt 86.1 kg   SpO2 94%   BMI 25.04 kg/m  Pain Scale: 0-10 POSS *See Group Information*: 1-Acceptable,Awake and alert Pain Score: 0-No pain   SpO2: SpO2: 94 % O2 Device:SpO2: 94 % O2 Flow Rate: .O2 Flow Rate (L/min): 0 L/min  IO: Intake/output summary:  Intake/Output Summary (Last 24 hours) at 01/26/2024 0848 Last data filed at 01/25/2024 1802 Gross per 24 hour  Intake 109.57 ml  Output --  Net 109.57 ml    LBM: Last BM Date : 01/23/24 Baseline Weight: Weight: 83.5 kg Most recent weight: Weight: 86.1 kg       Time Total: 90 min  Greater than 50%  of this time was spent counseling and coordinating care related to the above assessment and plan.  Signed by: [name], NP Palliative Medicine Team Pager # [phone] (M-F 8a-5p) Team Phone # [phone] (Nights/Weekends)                     [1]  Allergies Allergen Reactions   Flomax [Tamsulosin Hcl] Swelling    congestion    "

## 2024-01-26 NOTE — Progress Notes (Signed)
 Hematology/oncology progress note  I reviewed patient's chart today.  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  Patient's flow cytometry from paracentesis and bone marrow biopsy results are pending at this time.  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  I called the sister of the patient Ms. Devere Ferretti who is the emergency contact and discussed the above results with her.  Patient would require transfer to [facility] for chemotherapy and further management at this time.  I discussed the diagnosis and prognosis in detail with her.  She stated that she would discuss this with the patient and for the family and would let us  know tomorrow of the decision about transfer.  I discussed the above with Dr. Caleen.  Also discussed giving rasburicase  3 mg for possible tumor lysis with worsening renal function and elevated uric acid even though potassium, phosphorus and calcium  are normal at this time.  Continue allopurinol  and aggressive IV hydration.  Hematology/oncology will continue to follow the patient and the decision regarding the transfer.  Please to let us  know of the decision and then we will coordinate the transfer process.  Mickiel Dry, MD Hematology/Oncology [facility]

## 2024-01-26 NOTE — Procedures (Signed)
 Interventional Radiology Procedure:   Indications: Leukocytosis and neoplastic disease  Procedure: Image guided bone marrow biopsy  Findings: 2 aspirates and 1 core biopsy from right ilium     Complications: None     EBL: Minimal  Plan:  Bedrest 1 hour   Thelonious Kauffmann R. Philip, MD  Pager: [phone]

## 2024-01-26 NOTE — Progress Notes (Signed)
 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  Rest of the testing is currently pending.  Discussed with oncology who is also in discussion with [facility] regarding possible treatment plan. I spoke with family at bedside and discussed with oncologist, family is opted to wait tonight to see how he does before making further decision regarding conservative management/palliative care versus chemotherapy.  For now we will give rasburicase  and continue IV fluids for possible tumor lysis syndrome. Oncology will continue to follow along and decide to see if patient will need to be transferred eventually or not.  Burgess Dare MD [facility]

## 2024-01-26 NOTE — Sedation Documentation (Signed)
 [facility] at bedside for transport back to [facility]

## 2024-01-27 ENCOUNTER — Inpatient Hospital Stay: Payer: Medicare (Managed Care) | Admitting: Oncology

## 2024-01-27 ENCOUNTER — Inpatient Hospital Stay: Payer: Medicare (Managed Care)

## 2024-01-27 DIAGNOSIS — C8373 Burkitt lymphoma, intra-abdominal lymph nodes: Secondary | ICD-10-CM | POA: Diagnosis not present

## 2024-01-27 DIAGNOSIS — C8370 Burkitt lymphoma, unspecified site: Secondary | ICD-10-CM | POA: Diagnosis not present

## 2024-01-27 DIAGNOSIS — S065XAA Traumatic subdural hemorrhage with loss of consciousness status unknown, initial encounter: Secondary | ICD-10-CM | POA: Diagnosis not present

## 2024-01-27 DIAGNOSIS — Z7189 Other specified counseling: Secondary | ICD-10-CM | POA: Diagnosis not present

## 2024-01-27 DIAGNOSIS — N179 Acute kidney failure, unspecified: Secondary | ICD-10-CM | POA: Diagnosis not present

## 2024-01-27 DIAGNOSIS — Z515 Encounter for palliative care: Secondary | ICD-10-CM | POA: Diagnosis not present

## 2024-01-27 LAB — GLUCOSE, CAPILLARY
Glucose-Capillary: 77 mg/dL (ref 70–99)
Glucose-Capillary: 73 mg/dL (ref 70–99)
Glucose-Capillary: 70 mg/dL (ref 70–99)
Glucose-Capillary: 69 mg/dL — ABNORMAL LOW (ref 70–99)
Glucose-Capillary: 87 mg/dL (ref 70–99)
Glucose-Capillary: 84 mg/dL (ref 70–99)
Glucose-Capillary: 79 mg/dL (ref 70–99)
Glucose-Capillary: 83 mg/dL (ref 70–99)

## 2024-01-27 LAB — CBC WITH DIFFERENTIAL/PLATELET
Abs Immature Granulocytes: 2.1 K/uL — ABNORMAL HIGH (ref 0.00–0.07)
Band Neutrophils: 1 %
Basophils Absolute: 0 K/uL (ref 0.0–0.1)
Basophils Relative: 0 %
Blasts: 16 %
Eosinophils Absolute: 0.4 K/uL (ref 0.0–0.5)
Eosinophils Relative: 1 %
HCT: 39.1 % (ref 39.0–52.0)
Hemoglobin: 12.8 g/dL — ABNORMAL LOW (ref 13.0–17.0)
Lymphocytes Relative: 8 %
Lymphs Abs: 3.3 K/uL (ref 0.7–4.0)
MCH: 32.3 pg (ref 26.0–34.0)
MCHC: 32.7 g/dL (ref 30.0–36.0)
MCV: 98.7 fL (ref 80.0–100.0)
Metamyelocytes Relative: 2 %
Monocytes Absolute: 2.1 K/uL — ABNORMAL HIGH (ref 0.1–1.0)
Monocytes Relative: 5 %
Myelocytes: 1 %
Neutro Abs: 26.8 K/uL — ABNORMAL HIGH (ref 1.7–7.7)
Neutrophils Relative %: 64 %
Platelets: 194 K/uL (ref 150–400)
Promyelocytes Relative: 2 %
RBC: 3.96 MIL/uL — ABNORMAL LOW (ref 4.22–5.81)
RDW: 15.8 % — ABNORMAL HIGH (ref 11.5–15.5)
Smear Review: NORMAL
WBC: 41.3 K/uL — ABNORMAL HIGH (ref 4.0–10.5)
nRBC: 0.7 % — ABNORMAL HIGH (ref 0.0–0.2)

## 2024-01-27 LAB — COMPREHENSIVE METABOLIC PANEL WITH GFR
ALT: 16 U/L (ref 0–44)
AST: 83 U/L — ABNORMAL HIGH (ref 15–41)
Albumin: 3.6 g/dL (ref 3.5–5.0)
Alkaline Phosphatase: 45 U/L (ref 38–126)
Anion gap: 22 — ABNORMAL HIGH (ref 5–15)
BUN: 80 mg/dL — ABNORMAL HIGH (ref 8–23)
CO2: 19 mmol/L — ABNORMAL LOW (ref 22–32)
Calcium: 8.7 mg/dL — ABNORMAL LOW (ref 8.9–10.3)
Chloride: 96 mmol/L — ABNORMAL LOW (ref 98–111)
Creatinine, Ser: 4.29 mg/dL — ABNORMAL HIGH (ref 0.61–1.24)
GFR, Estimated: 14 mL/min — ABNORMAL LOW
Glucose, Bld: 59 mg/dL — ABNORMAL LOW (ref 70–99)
Potassium: 4.8 mmol/L (ref 3.5–5.1)
Sodium: 137 mmol/L (ref 135–145)
Total Bilirubin: 0.4 mg/dL (ref 0.0–1.2)
Total Protein: 5.3 g/dL — ABNORMAL LOW (ref 6.5–8.1)

## 2024-01-27 LAB — URIC ACID: Uric Acid, Serum: >21 mg/dL — ABNORMAL HIGH (ref 3.7–8.6)

## 2024-01-27 LAB — LACTATE DEHYDROGENASE: LDH: >2500 U/L — ABNORMAL HIGH (ref 105–235)

## 2024-01-27 LAB — PHOSPHORUS: Phosphorus: 4.6 mg/dL (ref 2.5–4.6)

## 2024-01-27 LAB — MAGNESIUM: Magnesium: 2.6 mg/dL — ABNORMAL HIGH (ref 1.7–2.4)

## 2024-01-27 MED ORDER — GLYCOPYRROLATE 0.2 MG/ML IJ SOLN: 0.4000 mg | INTRAMUSCULAR | Status: DC | PRN

## 2024-01-27 MED ORDER — LORAZEPAM 2 MG/ML IJ SOLN
0.5000 mg | Freq: Three times a day (TID) | INTRAMUSCULAR | Status: DC
  Administered 2024-01-27: 0.5 mg via INTRAVENOUS
  Filled 2024-01-27: qty 1

## 2024-01-27 MED ORDER — HYDROMORPHONE HCL 1 MG/ML IJ SOLN
0.5000 mg | INTRAMUSCULAR | Status: DC | PRN
  Administered 2024-01-27 (×2): 1 mg via INTRAVENOUS
  Filled 2024-01-27 (×2): qty 1

## 2024-01-27 MED ORDER — LORAZEPAM 2 MG/ML IJ SOLN
1.0000 mg | INTRAMUSCULAR | Status: DC | PRN
  Administered 2024-01-27: 1 mg via INTRAVENOUS
  Filled 2024-01-27: qty 1

## 2024-01-27 MED ORDER — BIOTENE DRY MOUTH MT LIQD: 15.0000 mL | OROMUCOSAL | Status: DC | PRN

## 2024-01-27 MED ORDER — HALOPERIDOL LACTATE 2 MG/ML PO CONC: 0.5000 mg | ORAL | Status: DC | PRN

## 2024-01-27 MED ORDER — HALOPERIDOL LACTATE 5 MG/ML IJ SOLN
0.5000 mg | INTRAMUSCULAR | Status: DC | PRN
  Administered 2024-01-27: 0.5 mg via INTRAVENOUS
  Filled 2024-01-27: qty 1

## 2024-01-27 MED ORDER — POLYVINYL ALCOHOL 1.4 % OP SOLN: 1.0000 [drp] | Freq: Four times a day (QID) | OPHTHALMIC | Status: DC | PRN

## 2024-01-27 MED ORDER — DEXTROSE 10 % IV SOLN: INTRAVENOUS | Status: DC

## 2024-01-27 MED ORDER — OXYCODONE HCL 5 MG PO TABS: 2.5000 mg | ORAL_TABLET | ORAL | Status: DC | PRN

## 2024-01-27 MED ORDER — LACTULOSE 10 GM/15ML PO SOLN
20.0000 g | Freq: Once | ORAL | Status: AC
  Administered 2024-01-27: 20 g via ORAL
  Filled 2024-01-27: qty 30

## 2024-01-27 MED ORDER — HALOPERIDOL 0.5 MG PO TABS: 0.5000 mg | ORAL_TABLET | ORAL | Status: DC | PRN

## 2024-01-27 NOTE — Progress Notes (Signed)
 Pt appeared restless, constantly attempting to get out of bed, arms flailing with episodes of moaning. Pts ex wife was at bedside visually upset stating she expected the pt to be sleeping without any occurrences of movement. This nurse attempted to calm and explain the comfort care process, but was unsuccessful, Camelia Hock, RN, [employer] assisted with explaining the comfort care process as well as medications for the pt as well as behaviors.  The ex wife was able to calm down. This nurse administered Dilaudid  1mg  to aid in calming the pt. Pt tolerated well. Respirations continued evenly, no further restlessness was displayed. Will continue to monitor pt.

## 2024-01-27 NOTE — Progress Notes (Signed)
 Pt restless, pulling at IV, gown, moving side to side in bed, kicking feet in air, occasional moaning. Pt has received ativan , dilaudid  and haldol in last two hours with no noted effect. Pt's family member very upset that pt is not sleeping. Had discussion on dying process, expectations for medication and effects, expected behaviors and encouraged her to take breaks for refreshments and just walking to help with her own stress and anxiety. Discussion on planned med admin for effectiveness with assigned nurse, charge nurse,  pt's family member and covering NP, all in agreement at this time. Support and encouragement offered to family members, further questions taken and answered. Dilaudid  1mg  IV given per assigned nurse with noted calming of patient, deep even respirations and no restlessness at this time.

## 2024-01-27 NOTE — Progress Notes (Signed)
 Palliative:  Notified by nursing that patient has woken and expressed to family he does not desire transfer and wishes to maintain here and pursue comfort care. I went to bedside and Douglas Bryant was able to express the same to me. I spoke with him without family and he verifies that he has no worries or fears about dying and he is ready and accepting. Discussed comfort care with family in detail. Will scheduled anxiety medication and liberal pain medication available PRN. Discussed with Dr. Vicci, oncology, and nursing staff. Cancel transfer to [facility]. Full comfort care and maintain hospitalized here - comfort orders placed.   45 min  [name], NP Palliative Medicine Team Pager [phone] (Please see [email or web address] for schedule) Team Phone [phone]

## 2024-01-27 NOTE — Discharge Summary (Signed)
 " DISCHARGE SUMMARY    Douglas Bryant  FMW:[id number] DOB: [birth year] DOA: 01/22/2024 PCP: Maree Isles, MD   Chief Complaint  Patient presents with   Abdominal Pain   Level of care: Med-Surg   Physician Discharge Summary  Douglas Bryant FMW:[id number] DOB: [birth year] DOA: 01/22/2024  PCP: Maree Isles, MD  Admit date: 01/22/2024 Discharge date: 01/27/2024  Disposition: TRANSFER TO [facility]   Discharge Condition: GUARDED   CODE STATUS: DNR  DIET:  REGULAR    Hospital Course  Brief Admission History:  Brief Narrative:   73 y.o. 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     Went to outside ED 12/22 and found to have new abdominal mass compatible with new lymphoma mass with ascites, as well as AKI and UTI.   Had pending appointment with oncology, but was too weak and falling, so came to the ER for further evaluation, where he was found to have again dehydration with AKI.  CT showed epidural hematoma.   Repeat CT head has remained stable.  Now awaiting paracentesis 12/29 by IR, as well as oncology and palliative care consultations.   Assessment & Plan:  Abdominal mass favoring lymphoma History of diffuse large B-cell lymphoma Abdominal distention with ascites/lymphoma Anemia Leukocytosis CT abdomen and pelvis shows a large mass in the periduodenal space.  Differential includes lymphoma versus primary pancreatic or colonic malignancy.  There is a concern for new mesenteric mass.  Discussed with IR, unable to biopsy given ascites.  Hematology team has been consulted. -Paracentesis, send out cytology.  Status post paracentesis 5.5 L removed 12/29.  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     AKI Dehydration Baseline creatinine 0.8 in October 2025, continues to trend upwards.  Admission creatinine 2.9 > 3.33.  Getting IV fluids - Foley catheter insertion - may require temporary HD treatment given need to start chemotherapy      Epidural hematoma Initial CT head showed 7 mm epidural hematoma case was discussed with neurosurgery Dr. Malcolm by previous provider who recommended repeating the study which was repeated on 12/27.  Repeat study is stable    Starvation ketoacidosis with intermittent hypoglycemia Getting D5 fluids.  As needed glucagon    Hyponatremia Resolved with IV fluids -Hold HCTZ - Monitor with fluids    Acquired hypothyroidism -Continue levothyroxine    Essential hypertension (controlled) Soft blood pressure -Hold home medication, midodrine  p.o.  DVT prophylaxis: SCDs Start: 01/22/24 2052   Current Facility-Administered Medications  Medication Dose Route Frequency Provider Last Rate Last Admin   acetaminophen  (TYLENOL ) tablet 650 mg  650 mg Oral Q6H PRN Adefeso, Oladapo, DO   650 mg at 01/25/24 2108   Or   acetaminophen  (TYLENOL ) suppository 650 mg  650 mg Rectal Q6H PRN Adefeso, Oladapo, DO       allopurinol  (ZYLOPRIM ) tablet 300 mg  300 mg Oral Daily Maree, Pratik D, DO   300 mg at 01/27/24 0831   Chlorhexidine  Gluconate Cloth 2 % PADS 6 each  6 each Topical Daily Amin, Ankit C, MD   6 each at 01/26/24 1735   dextrose  10 % infusion   Intravenous Continuous Vicci, Wells Mabe L, MD 75 mL/hr at 01/27/24 0833 New Bag at 01/27/24 0833   dextrose  50 % solution 50 mL  1 ampule Intravenous PRN Amin, Ankit  C, MD   50 mL at 01/26/24 1240   feeding supplement (ENSURE PLUS HIGH PROTEIN) liquid 237 mL  237 mL Oral BID BM Shah, Pratik D, DO   237 mL at 01/27/24 0835   guaiFENesin (ROBITUSSIN) 100 MG/5ML liquid 5 mL  5 mL Oral Q4H PRN Amin, Ankit C, MD       hydrALAZINE (APRESOLINE) injection 10 mg  10 mg Intravenous Q4H PRN Amin, Ankit C, MD        ipratropium-albuterol (DUONEB) 0.5-2.5 (3) MG/3ML nebulizer solution 3 mL  3 mL Nebulization Q4H PRN Amin, Ankit C, MD       levothyroxine  (SYNTHROID ) tablet 50 mcg  50 mcg Oral Q0600 Adefeso, Oladapo, DO   50 mcg at 01/27/24 9372   LORazepam  (ATIVAN ) tablet 0.5 mg  0.5 mg Oral QHS Jonel Lonni SQUIBB, MD   0.5 mg at 01/27/24 0139   metoprolol  tartrate (LOPRESSOR ) injection 5 mg  5 mg Intravenous Q4H PRN Amin, Ankit C, MD       midodrine  (PROAMATINE ) tablet 5 mg  5 mg Oral TID WC Amin, Ankit C, MD   5 mg at 01/27/24 1220   ondansetron  (ZOFRAN ) injection 4 mg  4 mg Intravenous Q6H PRN Amin, Ankit C, MD       oxyCODONE  (Oxy IR/ROXICODONE ) immediate release tablet 2.5 mg  2.5 mg Oral Q4H PRN Parker, Alicia C, NP       polyethylene glycol (MIRALAX  / GLYCOLAX ) packet 17 g  17 g Oral Daily Maree, Pratik D, DO   17 g at 01/27/24 0831   senna (SENOKOT) tablet 17.2 mg  2 tablet Oral Daily Parker, Alicia C, NP   17.2 mg at 01/27/24 0831   sodium chloride  (OCEAN) 0.65 % nasal spray 1 spray  1 spray Each Nare PRN Jonel Lonni SQUIBB, MD   1 spray at 01/25/24 1705   sodium chloride  0.9 % bolus 1,000 mL  1,000 mL Intravenous Once Donnajean Lynwood DEL, PA-C       thiamine  (VITAMIN B1) injection 100 mg  100 mg Intravenous Daily Adefeso, Oladapo, DO   100 mg at 01/27/24 9168     Procedures/Studies: IR BONE MARROW BIOPSY & ASPIRATION Result Date: 01/26/2024 INDICATION: Leukocytosis and concern for Burkitt's lymphoma. Request for a bone marrow biopsy. EXAM: FLUOROSCOPIC GUIDED BONE MARROW ASPIRATES AND BIOPSY Physician: Juliene SAUNDERS. Philip, MD MEDICATIONS: 1% lidocaine  for local anesthetic ANESTHESIA/SEDATION: Patient's level of consciousness and vital signs were monitored continuously by radiology nurse throughout the procedure under the supervision of the provider performing the procedure. FLUOROSCOPY: Radiation Exposure Index (as provided by the fluoroscopic device): 13.7 mGy Kerma COMPLICATIONS: None immediate.  PROCEDURE: The procedure was explained to the patient. The risks and benefits of the procedure were discussed and the patient's questions were addressed. Informed consent was obtained from the patient. The patient was placed prone on interventional table. The back was prepped and draped in sterile fashion. Maximal barrier sterile technique was utilized including caps, mask, sterile gowns, sterile gloves, sterile drape, hand hygiene and skin antiseptic. The skin and right posterior ilium were anesthetized with 1% lidocaine . 11 gauge bone needle was directed into the right ilium with fluoroscopic guidance. Two aspirates and 1 core biopsy were obtained. Bandage placed over the puncture site. Fluoroscopic image saved for documentation. IMPRESSION: Fluoroscopic guided bone marrow aspiration and core biopsy. Electronically Signed   By: Juliene Philip M.D.   On: 01/26/2024 21:01   US  Paracentesis Result Date: 01/25/2024 INDICATION: 73 year old male. History of B-cell  lymphoma. Endorsing abdominal pain. Found to have ascites. Request is for therapeutic and diagnostic paracentesis. EXAM: ULTRASOUND GUIDED THERAPEUTIC AND DIAGNOSTIC PARACENTESIS MEDICATIONS: Lidocaine  1% 10 mL COMPLICATIONS: None immediate. PROCEDURE: Informed written consent was obtained from the patient after a discussion of the risks, benefits and alternatives to treatment. A timeout was performed prior to the initiation of the procedure. Initial ultrasound scanning demonstrates a moderate amount of ascites within the right lower abdominal quadrant. The right lower abdomen was prepped and draped in the usual sterile fashion. 1% lidocaine  was used for local anesthesia. Following this, a 19 gauge, 7-cm, Yueh catheter was introduced. An ultrasound image was saved for documentation purposes. The paracentesis was performed. The catheter was removed and a dressing was applied. The patient tolerated the procedure well without immediate post procedural  complication. Patient received post-procedure intravenous albumin ; see nursing notes for details. FINDINGS: A total of approximately 5.5 L of hazy tan colored fluid was removed. Samples were sent to the laboratory as requested by the clinical team. IMPRESSION: Successful ultrasound-guided therapeutic and diagnostic paracentesis yielding 5.5 liters of hazy tan-colored peritoneal fluid. Performed by Delon Beagle NP Electronically Signed   By: Ester Sides M.D.   On: 01/25/2024 15:42   CT Head Wo Contrast Result Date: 01/23/2024 EXAM: CT HEAD WITHOUT CONTRAST 01/23/2024 12:22:11 AM TECHNIQUE: CT of the head was performed without the administration of intravenous contrast. Automated exposure control, iterative reconstruction, and/or weight based adjustment of the mA/kV was utilized to reduce the radiation dose to as low as reasonably achievable. COMPARISON: CT head 01/22/2024. CLINICAL HISTORY: Epidural. Repeat in 6 hours from initial scan FINDINGS: BRAIN AND VENTRICLES: Unchanged mildly hyperdense extra-axial 7 mm thick collection along the left frontal convexity, probably epidural. No evidence of acute infarct. No hydrocephalus. No midline shift. ORBITS: No acute abnormality. SINUSES: No acute abnormality. SOFT TISSUES AND SKULL: No skull fracture. IMPRESSION: 1. Unchanged 7 mm thick extra-axial (probably epidural) hemorrhage along the left frontal convexity. Electronically signed by: Gilmore Molt MD 01/23/2024 12:41 AM EST RP Workstation: [id number]   CT ABDOMEN PELVIS WO CONTRAST Result Date: 01/22/2024 EXAM: CT ABDOMEN AND PELVIS WITHOUT CONTRAST 01/22/2024 06:05:34 PM TECHNIQUE: CT of the abdomen and pelvis was performed without the administration of intravenous contrast. Multiplanar reformatted images are provided for review. Automated exposure control, iterative reconstruction, and/or weight-based adjustment of the mA/kV was utilized to reduce the radiation dose to as low as reasonably  achievable. COMPARISON: PET/CT examination of 11/28/2013. CLINICAL HISTORY: Mass, worsening pain. *tracking code: Bo*. Lymphoma. FINDINGS: LOWER CHEST: The lung bases are clear. Scattered atelectasis. Extensive coronary artery calcifications. LIVER: The liver is unremarkable. GALLBLADDER AND BILE DUCTS: Gallbladder is unremarkable. No biliary ductal dilatation. SPLEEN: The spleen is of normal size and is unremarkable in this noncontrast examination. PANCREAS: The head and uncinate process of the pancreas are involved by a large, infiltrative soft tissue mass centered within the right paraduodenal space, measuring at least 6.4 x 14.6 cm. The mass encases the distal second and proximal third portion of the duodenum, encases the proximal ascending colon, extending inferiorly to the level of the ileocecal junction, and is indistinct from the head and uncinate process of the pancreas. Despite complete encasement of the second portion of the duodenum, this structure is nonobstructed. Similarly, the ascending colon demonstrates no evidence of obstruction. Among the differential considerations, lymphoma should be strongly considered. Additional considerations could include a primary colonic malignancy, or less likely, a primary pancreatic malignancy. The body and tail of the pancreas are  unremarkable. ADRENAL GLANDS: No acute abnormality. KIDNEYS, URETERS AND BLADDER: No stones in the kidneys or ureters. No hydronephrosis. No perinephric or periureteral stranding. Urinary bladder is unremarkable. GI AND BOWEL: Stomach demonstrates no acute abnormality. The distal second and proximal third portion of the duodenum and the proximal ascending colon are encased by the previously described mass, extending inferiorly to the level of the ileocecal junction. Despite complete encasement of the second portion of the duodenum, this structure is nonobstructed. Similarly, the ascending colon demonstrates no evidence of obstruction. There  is no bowel obstruction. PERITONEUM AND RETROPERITONEUM: Extensive omental caking and peritoneal thickening, best appreciated inferiorly, in keeping with peritoneal carcinomatosis. Large volume ascites. No free air. VASCULATURE: Aorta is normal in caliber. Mild aortoiliac atherosclerotic calcification. No aortic aneurysm. LYMPH NODES: Pathologic mesenteric and celiac adenopathy appears confluent with the primary mass. Shotty retroperitoneal adenopathy is present within the aortocaval and periaortic lymph node groups. REPRODUCTIVE ORGANS: The prostate gland is mildly enlarged. BONES AND SOFT TISSUES: Surgical changes of L3-4 anterior and posterior lumbar fusion with instrumentation are noted. No suspicious lytic or blastic bone lesion. Stable lytic lesion within the right ilium, likely benign given stability over time. Small fat and fluid containing left inguinal hernia. IMPRESSION: 1. Large infiltrative right paraduodenal space mass (at least 6.4 x 14.6 cm) encasing the distal second and proximal third duodenum and proximal ascending colon, and indistinguishable from the pancreatic head/uncinate process, without bowel obstruction; among the differential considerations, lymphoma is favored, with additional considerations less likely including primary colonic malignancy or primary pancreatic malignancy. PET CT is recommended for further evaluation and to guide biopsy strategy. 2. Extensive omental caking and peritoneal thickening consistent with peritoneal carcinomatosis, with large volume ascites. 3. Pathologic mesenteric and celiac adenopathy confluent with the primary mass, with additional shotty retroperitoneal adenopathy. 4. Extensive coronary artery calcifications. 5. Postsurgical changes of L3-4 anterior and posterior lumbar fusion with instrumentation. 6. Stable lytic lesion in the right ilium, likely benign given stability over time. 7. Mild prostatomegaly. 8. Small fat and fluid containing left inguinal  hernia. 9. RAF score includes Aortic atherosclerosis (ICD10-I70.0). Electronically signed by: Dorethia Molt MD 01/22/2024 07:29 PM EST RP Workstation: [id number]   CT Cervical Spine Wo Contrast Result Date: 01/22/2024 EXAM: CT CERVICAL SPINE WITHOUT CONTRAST 01/22/2024 06:05:34 PM TECHNIQUE: CT of the cervical spine was performed without the administration of intravenous contrast. Multiplanar reformatted images are provided for review. Automated exposure control, iterative reconstruction, and/or weight based adjustment of the mA/kV was utilized to reduce the radiation dose to as low as reasonably achievable. COMPARISON: None available. CLINICAL HISTORY: Neck trauma (Age >= 65y) FINDINGS: BONES AND ALIGNMENT: No acute fracture or traumatic malalignment. DEGENERATIVE CHANGES: Right C4-C5 facet arthropathy. SOFT TISSUES: No prevertebral soft tissue swelling. IMPRESSION: 1. No significant abnormality Electronically signed by: Gilmore Molt 01/22/2024 07:17 PM EST RP Workstation: [id number]   CT Head Wo Contrast Addendum Date: 01/22/2024  ADDENDUM #1 ADDENDUM: These findings were discussed with LYNWOOD LUKES TEMPLETON PA by Dr. Margarite over the phone on 01/22/2024 at 6:33 pm . ---------------------------------------------------- Electronically signed by: Kate Margarite MD 01/22/2024 06:34 PM EST RP Workstation: [id number]   Result Date: 01/22/2024 * ORIGINAL REPORT EXAM: CT HEAD WITHOUT CONTRAST 01/22/2024 06:05:34 PM TECHNIQUE: CT of the head was performed without the administration of intravenous contrast. Automated exposure control, iterative reconstruction, and/or weight based adjustment of the mA/kV was utilized to reduce the radiation dose to as low as reasonably achievable. COMPARISON: None available. CLINICAL HISTORY: Head trauma, minor (Age >=  65y) FINDINGS: BRAIN AND VENTRICLES: Acute 7 mm extra-axial hemorrhage along left frontal convexity, probably epidural but possibly subdural. Mild mass effect.  No midline shift. No evidence of acute infarct. No hydrocephalus. Atherosclerotic calcifications are present within the cavernous internal carotid and vertebral arteries. ORBITS: Bilateral lens replacement. SINUSES: No acute abnormality. SOFT TISSUES AND SKULL: No acute soft tissue abnormality. No skull fracture. IMPRESSION: 1. Acute left 7 mm extra-axial hemorrhage - probably epidural but possibly subdural, with mild mass effect and no midline shift. Electronically signed by: Morgane Naveau MD 01/22/2024 06:23 PM EST RP Workstation: [id number]     The results of significant diagnostics from this hospitalization (including imaging, microbiology, ancillary and laboratory) are listed below for reference.     Microbiology: Recent Results (from the past 240 hours)  Culture, body fluid w Gram Stain-bottle     Status: None (Preliminary result)   Collection Time: 01/25/24  1:50 PM   Specimen: Ascitic  Result Value Ref Range Status   Specimen Description ASCITIC BOTTLES DRAWN AEROBIC AND ANAEROBIC  Final   Special Requests 10CC  Final   Culture   Final    NO GROWTH 2 DAYS Performed at [facility], [address] [place], [place] [address]    Report Status PENDING  Incomplete  Gram stain     Status: None   Collection Time: 01/25/24  1:50 PM   Specimen: Ascitic  Result Value Ref Range Status   Specimen Description ASCITIC  Final   Special Requests ASCITIC  Final   Gram Stain   Final    ASCITIC CYTOSPIN SMEAR WBC PRESENT, PREDOMINANTLY MONONUCLEAR NO ORGANISMS SEEN Performed at [facility], [address], [place], [place] [address]    Report Status 01/25/2024 FINAL  Final     Labs: BNP (last 3 results) No results for input(s): BNP in the last 8760 hours.      Code Status: Limited: Do not attempt resuscitation (DNR) -DNR-LIMITED -Do Not Intubate/DNI  Family Communication:   Status is: Inpatient Bedside updated 01/27/24    PT Follow up Recs: Skilled Nursing-Short Term Rehab (<3  Hours/Day)01/25/2024 1557   Consultants:  Oncology  Nephrology  Procedures:   Antimicrobials:    Subjective: Pt somnolent but arousable.   Objective: Vitals:   01/26/24 1317 01/26/24 2015 01/27/24 0432 01/27/24 1405  BP: 104/63 (!) 110/58 119/71 (!) 104/58  Pulse: 94 86 68 (!) 58  Resp:  18 18 16   Temp:  97.6 F (36.4 C) 97.6 F (36.4 C) (!) 97.4 F (36.3 C)  TempSrc:  Oral Oral Oral  SpO2:  92% 94% 90%  Weight:      Height:        Intake/Output Summary (Last 24 hours) at 01/27/2024 1426 Last data filed at 01/27/2024 1100 Gross per 24 hour  Intake 699.92 ml  Output 170 ml  Net 529.92 ml   Filed Weights   01/22/24 1250 01/22/24 2054  Weight: 83.5 kg 86.1 kg   Examination:  General exam: Appears calm and comfortable but somnolent.  Respiratory system: Clear to auscultation. Respiratory effort normal. Cardiovascular system: normal S1 & S2 heard. No JVD, murmurs, rubs, gallops or clicks. No pedal edema. Gastrointestinal system: Abdomen is nondistended, soft and nontender. No organomegaly or masses felt. Normal bowel sounds heard. Central nervous system: somnolent but arousable. No focal neurological deficits. Extremities: Symmetric 5 x 5 power. Skin: No rashes, lesions or ulcers. Psychiatry: Judgement and insight unable to determine.    Data Reviewed: I have personally reviewed following  labs and imaging studies  CBC: Recent Labs  Lab 01/24/24 0445 01/24/24 1302 01/25/24 1759 01/26/24 0416 01/27/24 0450  WBC 31.6* 30.0* 28.8* 38.6* 41.3*  NEUTROABS 22.4* 21.0* 20.2* 20.9* 26.8*  HGB 11.8* 11.7* 11.0* 12.1* 12.8*  HCT 34.9* 34.0* 33.0* 36.4* 39.1  MCV 96.7 96.6 98.2 99.7 98.7  PLT 325 286 207 231 194    Basic Metabolic Panel: Recent Labs  Lab 01/23/24 0602 01/24/24 0445 01/25/24 0502 01/25/24 1759 01/26/24 0416 01/27/24 0450  NA 131* 133* 135 136 136 137  K 4.6 3.9 3.9 4.2 4.6 4.8  CL 88* 92* 95* 95* 96* 96*  CO2 23 21* 21* 15* 14* 19*   GLUCOSE 71 68* 73 56* 37* 59*  BUN 59* 59* 60* 66* 72* 80*  CREATININE 3.00* 2.68* 2.51* 2.88* 3.33* 4.29*  CALCIUM  9.1 8.9 8.8* 8.9 9.0 8.7*  MG 2.6* 2.5* 2.5*  --   --  2.6*  PHOS 4.0  --   --  3.4 4.1 4.6    CBG: Recent Labs  Lab 01/27/24 0430 01/27/24 0622 01/27/24 0732 01/27/24 0822 01/27/24 1150  GLUCAP 73 70 69* 87 84    Recent Results (from the past 240 hours)  Culture, body fluid w Gram Stain-bottle     Status: None (Preliminary result)   Collection Time: 01/25/24  1:50 PM   Specimen: Ascitic  Result Value Ref Range Status   Specimen Description ASCITIC BOTTLES DRAWN AEROBIC AND ANAEROBIC  Final   Special Requests 10CC  Final   Culture   Final    NO GROWTH 2 DAYS Performed at [facility], [address] [place], [place] [address]    Report Status PENDING  Incomplete  Gram stain     Status: None   Collection Time: 01/25/24  1:50 PM   Specimen: Ascitic  Result Value Ref Range Status   Specimen Description ASCITIC  Final   Special Requests ASCITIC  Final   Gram Stain   Final    ASCITIC CYTOSPIN SMEAR WBC PRESENT, PREDOMINANTLY MONONUCLEAR NO ORGANISMS SEEN Performed at [facility], [address], [place], [place] [address]    Report Status 01/25/2024 FINAL  Final     Radiology Studies: IR BONE MARROW BIOPSY & ASPIRATION Result Date: 01/26/2024 INDICATION: Leukocytosis and concern for Burkitt's lymphoma. Request for a bone marrow biopsy. EXAM: FLUOROSCOPIC GUIDED BONE MARROW ASPIRATES AND BIOPSY Physician: Juliene SAUNDERS. Philip, MD MEDICATIONS: 1% lidocaine  for local anesthetic ANESTHESIA/SEDATION: Patient's level of consciousness and vital signs were monitored continuously by radiology nurse throughout the procedure under the supervision of the provider performing the procedure. FLUOROSCOPY: Radiation Exposure Index (as provided by the fluoroscopic device): 13.7 mGy Kerma COMPLICATIONS: None immediate. PROCEDURE: The procedure was explained to the patient. The risks  and benefits of the procedure were discussed and the patient's questions were addressed. Informed consent was obtained from the patient. The patient was placed prone on interventional table. The back was prepped and draped in sterile fashion. Maximal barrier sterile technique was utilized including caps, mask, sterile gowns, sterile gloves, sterile drape, hand hygiene and skin antiseptic. The skin and right posterior ilium were anesthetized with 1% lidocaine . 11 gauge bone needle was directed into the right ilium with fluoroscopic guidance. Two aspirates and 1 core biopsy were obtained. Bandage placed over the puncture site. Fluoroscopic image saved for documentation. IMPRESSION: Fluoroscopic guided bone marrow aspiration and core biopsy. Electronically Signed   By: Juliene Philip M.D.   On: 01/26/2024 21:01   US  Paracentesis  Result Date: 01/25/2024 INDICATION: 73 year old male. History of B-cell lymphoma. Endorsing abdominal pain. Found to have ascites. Request is for therapeutic and diagnostic paracentesis. EXAM: ULTRASOUND GUIDED THERAPEUTIC AND DIAGNOSTIC PARACENTESIS MEDICATIONS: Lidocaine  1% 10 mL COMPLICATIONS: None immediate. PROCEDURE: Informed written consent was obtained from the patient after a discussion of the risks, benefits and alternatives to treatment. A timeout was performed prior to the initiation of the procedure. Initial ultrasound scanning demonstrates a moderate amount of ascites within the right lower abdominal quadrant. The right lower abdomen was prepped and draped in the usual sterile fashion. 1% lidocaine  was used for local anesthesia. Following this, a 19 gauge, 7-cm, Yueh catheter was introduced. An ultrasound image was saved for documentation purposes. The paracentesis was performed. The catheter was removed and a dressing was applied. The patient tolerated the procedure well without immediate post procedural complication. Patient received post-procedure intravenous albumin ; see  nursing notes for details. FINDINGS: A total of approximately 5.5 L of hazy tan colored fluid was removed. Samples were sent to the laboratory as requested by the clinical team. IMPRESSION: Successful ultrasound-guided therapeutic and diagnostic paracentesis yielding 5.5 liters of hazy tan-colored peritoneal fluid. Performed by Delon Beagle NP Electronically Signed   By: Ester Sides M.D.   On: 01/25/2024 15:42    Scheduled Meds:  allopurinol   300 mg Oral Daily   Chlorhexidine  Gluconate Cloth  6 each Topical Daily   feeding supplement  237 mL Oral BID BM   levothyroxine   50 mcg Oral Q0600   LORazepam   0.5 mg Oral QHS   midodrine   5 mg Oral TID WC   polyethylene glycol  17 g Oral Daily   senna  2 tablet Oral Daily   thiamine  (VITAMIN B1) injection  100 mg Intravenous Daily   Continuous Infusions:  dextrose  75 mL/hr at 01/27/24 0833   sodium chloride        LOS: 5 days   Time spent: 57 mins  Dontrail Blackwell Vicci, MD How to contact the [facility] Attending or Consulting provider 7A - 7P or covering provider during after hours 7P -7A, for this patient?  Check the care team in [facility] and look for a) attending/consulting [facility] provider listed and b) the [facility] team listed Log into [email or web address] to find provider on call.  Locate the [facility] provider you are looking for under [facility] and page to a number that you can be directly reached. If you still have difficulty reaching the provider, please page the [facility] (Director on Call) for the Hospitalists listed on amion for assistance.  01/27/2024, 2:26 PM    "

## 2024-01-27 NOTE — Progress Notes (Addendum)
 Palliative:  HPI: 73 y.o. 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 Recent discovery of new abdominal mass compatible with new lymphoma mass ascites, AKI, UTI on 12/22 at outside ED. Plans for follow up with oncology but required admission for dehydration and AKI as well as epidural hematoma. S/P paracentesis 12/29 5.5L. Plans for bone marrow biopsy 12/30.   I met at Douglas Bryant's bedside along with sister Douglas Bryant and two sons Douglas Bryant and Douglas Bryant. Joined by ex-wife Douglas Bryant. Long discussions regarding findings of Burkitt's lymphoma and path forward. They have agreed on transfer to [facility] to pursue possible treatment for lymphoma. We discussed that this would likely require chemotherapy as well as dialysis with hopes that dialysis would be temporary. We also discussed the complicating factor of tumor lysis and how this could impact prognosis and expectations of improvement. We made attempts at discussion with Douglas Bryant today but he is unable to participate in conversation. We discussed that this is likely secondary to his worsening kidney function.   We did discuss the reality of moving forward with treatment is not going to be easy but there could be a possibility of improvement. We discussed the option of treatment and hopes that Douglas Bryant can at least have improved mental status to make his own decisions (likely following dialysis trial). They all agree he would not desire long term dialysis. Douglas Bryant struggles to think that they may put him through a lot of suffering with fears he will not return to a good quality of life. We discussed the difficulty in this decision as he is very ill but there is also the possibility that he could improve with treatment. I shared that I believe that transfer to [facility] is reasonable and will be good for them to have more peace of mind even  if he or they ultimately decide to transition to comfort care. We did discuss that even in pursuing treatment the decision can be made to stop chemotherapy and dialysis at any point in time. We discussed comfort care as a focus on symptom management ensuring he is resting comfortably instead of checking and treating numbers.   I encouraged them to ask the hard questions of the oncology team at [facility]. I encouraged them to ask about expectations and best and worst scenarios. I encouraged them to request follow up with palliative care team there as well for ongoing support.   All questions/concerns addressed. Emotional support provided.   Exam: Lethargic. More confused today likely due to worsening renal failure. Unable to stay awake to focus on conversation. Worse than yesterday but not significantly worse. Breathing regular, unlabored. Abd distended - likely ascites recurring. BLE edema. Warm to touch.   Plan: - DNR/DNI - Has been approved but awaiting bed assignment for transfer to [facility] - Recommend follow up with [facility] palliative team - No BM yet - senokot 2 tabs yesterday - will give lactulose today  80 min  Douglas Kitty, NP Palliative Medicine Team Pager [phone] (Please see [email or web address] for schedule) Team Phone [phone]

## 2024-01-27 NOTE — Progress Notes (Addendum)
 BS: 77  Apple Juice given, rechecked after 15 min  BS: 73 - Pt slightly disoriented, Pt stated he cannot tolerate anymore to drink at the moment, he is full. his nurse is concerned his BS will decline considering he needed D5 on 12/30. Charge nurse Sandi notified. Will continue to monitor bs.

## 2024-01-27 NOTE — Progress Notes (Signed)
 Pt continues to display periods of restlessness. Ativan  not scheduled until 10pm 12/31. Sent a secure chat to MD Adefeso inquiring giving pt an addtl dose to calm pt. Dose was administered, pt tolerated well.

## 2024-01-27 NOTE — Consult Note (Signed)
 [facility]  INPATIENT CONSULTATION  Reason for Consultation: AKI Requesting Provider: Dr. Vicci  HPI: Douglas Bryant is an 73 y.o. male HTN, hypothyroidism, h/o DLBCL 2017, back pain who nephrology is consulted regarding evaluation and management of AKI.   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  He had onc f/u 12/31 but presented 01/22/24 [facility] ED in setting of generalized weakness and falls, nausea and was admitted.  CT head with small epidural hematoma, no intervention needed.  CT A/P non con showed large infiltrative R paraduodenal mass (6x14cm +), extensive omental caking and peritoneal thickening, large volume ascites.  Onc is following and flow cytometry from peritoneal fluid c/f Burkitt's lymphoma - BMBx done 12/30 and pending.  He rec'd allopurinol , rasburicase  yesterday as well with LDH < 2500 and uric acid > 21.  Uric acid remains > 21 this AM.  Transfer to [facility] proposed and family discussing pall care vs transfer for possible treatment.  Cr 10/2023 was 0.7.  12/26 Cr 2.9 and he rec'd isotonic fluids.  It's trended up to 3.3 yesterday and 4.3 today.  12/29 had 5.5L LVP.  No recent IV contrast. Lowest BPs 100s.  Serum sodium was initially 127 and has improved to 137 today.   No urine studies available.  UOP not recorded for a few days but he is urinating per family.  PMH: Past Medical History:  Diagnosis Date   Anemia    Aneurysm, aorta, thoracic    Anxiety    Back pain    Constipation    Depression    DLBCL (diffuse large B cell lymphoma) (HCC) 07/17/2015   Environmental allergies    History of blood transfusion    History of bronchitis    History of chemotherapy    Hypertension    Hypothyroidism    Marijuana use    Nocturia    Peripheral vascular disease    Urinary frequency    PSH: Past Surgical History:  Procedure Laterality Date   ANTERIOR AND POSTERIOR SPINAL FUSION  N/A 10/01/2023   Procedure: EXTREME LATERAL INTERBODY FUSION OF LUMBAR THREE TO LUMBAR FOUR WITH POSTERIOR SUPPLEMENTAL FUSION INSTRAMENTED OF LUMBAR THREE TO LUMBAR FOUR;  Surgeon: Burnetta Aures, MD;  Location: [facility] OR;  Service: Orthopedics;  Laterality: N/A;  Extreme lateral interbody fusion L3-4,Posterior spinal fusion instrumentation L3-4   BONE MARROW BIOPSY     COLONOSCOPY N/A 08/12/2018   Procedure: COLONOSCOPY;  Surgeon: Golda Claudis PENNER, MD;  Location: [facility] ENDO SUITE;  Service: Endoscopy;  Laterality: N/A;  1030   HERNIA REPAIR     umblical times 2; inguinal (left)    IR BONE MARROW BIOPSY & ASPIRATION  01/26/2024   IR REMOVAL TUN ACCESS W/ PORT W/O FL MOD SED  07/08/2016   POLYPECTOMY  08/12/2018   Procedure: POLYPECTOMY;  Surgeon: Golda Claudis PENNER, MD;  Location: [facility] END[facility] SUITE;  Service: Endoscopy;;  colon   port a cath placement     RHINOPLASTY      Past Medical History:  Diagnosis Date   Anemia    Aneurysm, aorta, thoracic    Anxiety    Back pain    Constipation    Depression    DLBCL (diffuse large B cell lymphoma) (HCC) 07/17/2015   Environmental allergies    History of blood transfusion    History of bronchitis    History of chemotherapy    Hypertension  Hypothyroidism    Marijuana use    Nocturia    Peripheral vascular disease    Urinary frequency     Medications:  I have reviewed the patient's current medications.  Medications Prior to Admission  Medication Sig Dispense Refill   acetaminophen  (TYLENOL ) 500 MG tablet Take 1,000 mg by mouth every 6 (six) hours as needed for mild pain.     albuterol (VENTOLIN HFA) 108 (90 Base) MCG/ACT inhaler Inhale 1-2 puffs into the lungs every 4 (four) hours as needed for wheezing or shortness of breath.     bismuth subsalicylate (PEPTO BISMOL) 262 MG/15ML suspension Take 30 mLs by mouth every 6 (six) hours as needed for indigestion or diarrhea or loose stools.     busPIRone  (BUSPAR ) 10 MG tablet Take 10 mg by mouth 2 (two)  times daily.     Ca Carbonate-Mag Hydroxide (ROLAIDS PO) Take 2 tablets by mouth daily as needed (heartburn).     Carboxymethylcellul-Glycerin (LUBRICATING EYE DROPS OP) Place 1 drop into both eyes daily as needed (dry eyes).     cephALEXin (KEFLEX) 500 MG capsule Take 500 mg by mouth 3 (three) times daily.     chlorpheniramine (CHLOR-TRIMETON) 4 MG tablet Take 4 mg by mouth every 4 (four) hours as needed for allergies.     ciprofloxacin (CIPRO) 500 MG tablet Take 500 mg by mouth 2 (two) times daily.     famotidine (PEPCID) 20 MG tablet Take 20 mg by mouth 2 (two) times daily as needed for heartburn or indigestion.     fluticasone (FLONASE) 50 MCG/ACT nasal spray Place 1 spray into both nostrils daily as needed for allergies or rhinitis.     levothyroxine  (SYNTHROID ) 50 MCG tablet Take 50 mcg by mouth daily.     LINZESS 72 MCG capsule Take 72 mcg by mouth every morning.     LORazepam  (ATIVAN ) 0.5 MG tablet Take by mouth.     methocarbamol  (ROBAXIN ) 500 MG tablet Take 500 mg by mouth 2 (two) times daily.     metoprolol  succinate (TOPROL  XL) 25 MG 24 hr tablet Take 1 tablet (25 mg total) by mouth daily. Take with or immediately following a meal. 30 tablet 11   [EXPIRED] ondansetron  (ZOFRAN -ODT) 4 MG disintegrating tablet Take 4 mg by mouth every 8 (eight) hours as needed.     oxymetazoline  (AFRIN) 0.05 % nasal spray Place 1-2 sprays into both nostrils 2 (two) times daily as needed for congestion.     senna-docusate (SENOKOT-S) 8.6-50 MG tablet Take 1 tablet by mouth 2 (two) times daily. 10 tablet 0   traMADol (ULTRAM) 50 MG tablet Take by mouth.     valACYclovir (VALTREX) 500 MG tablet Take 500 mg by mouth daily as needed (Herpes Virus).     zolpidem  (AMBIEN ) 10 MG tablet Take 1 tablet (10 mg total) by mouth at bedtime. 30 tablet 5    ALLERGIES:  Allergies[1]  FAM HX: Family History  Problem Relation Age of Onset   Alcohol  abuse Father    Depression Sister    Anxiety disorder Sister     Depression Sister    Anxiety disorder Sister     Social History:   reports that he has never smoked. He has never used smokeless tobacco. He reports current alcohol  use of about 6.0 standard drinks of alcohol  per week. He reports current drug use. Frequency: 2.00 times per week. Drug: Marijuana.  ROS: 12 system ROS per HPI above  Blood pressure 119/71, pulse 68, temperature  97.6 F (36.4 C), temperature source Oral, resp. rate 18, height 6' 1 (1.854 m), weight 86.1 kg, SpO2 94%. PHYSICAL EXAM: Gen: frail, ill appearing man moaning in bed when not sleeping  ENT: MM tacky when mouth breathing CV: RRR no rub Abd: soft but distended, + fluid wave Lungs: clear Extr:  1+ pitting LE edema Neuro:  decreased LOC but arousable Skin: no rashes   Results for orders placed or performed during the hospital encounter of 01/22/24 (from the past 48 hours)  Body fluid cell count with differential     Status: Abnormal   Collection Time: 01/25/24  1:50 PM  Result Value Ref Range   Fluid Type-FCT ASCITIC     Comment: CORRECTED ON 12/29 AT 1448: PREVIOUSLY REPORTED AS Peritoneal   Color, Fluid AMBER (A) YELLOW   Appearance, Fluid CLOUDY (A) CLEAR   Total Nucleated Cell Count, Fluid >10,000 (H) 0 - 1,000 cu mm   Neutrophil Count, Fluid 30 (H) 0 - 25 %   Lymphs, Fluid 2 %   Monocyte-Macrophage-Serous Fluid 68 50 - 90 %    Comment: Performed at [facility], [address], [place], [place] [address]  Triglycerides, Body Fluid     Status: None   Collection Time: 01/25/24  1:50 PM  Result Value Ref Range   Triglycerides, Fluid 46 Not Estab. mg/dL    Comment: (NOTE) The reference interval(s) and other method performance specifications have not been established for this body fluid. The test result must be integrated into the clinical context for interpretation. Performed At: [facility] [address] [place], [place] [id number] Jennette Shorter MD Ey:[id number]    Fluid Type-FTRIG ASCITIES      Comment: Performed at [facility], [address], [place], [place] [address]  Culture, body fluid w Gram Stain-bottle     Status: None (Preliminary result)   Collection Time: 01/25/24  1:50 PM   Specimen: Ascitic  Result Value Ref Range   Specimen Description ASCITIC BOTTLES DRAWN AEROBIC AND ANAEROBIC    Special Requests 10CC    Culture      NO GROWTH 2 DAYS Performed at [facility], [address] [place], [place] [address]    Report Status PENDING   Protein, body fluid (other)     Status: None   Collection Time: 01/25/24  1:50 PM  Result Value Ref Range   Total Protein, Body Fluid Other 3.7 g/dL    Comment: (NOTE)             _________________________________________            : BODY FLUID TYPE :     TOTAL PROTEIN     :            :_________________:_______________________:            : Amniotic Fluid  :               <0.4    :            :_________________:_______________________:            :                 : Nonmalignant: <3.0    :            : Ascitic Fluid   : Malignant:    >3.0    :            :_________________:_______________________:            :  Bile, Clear     :               <0.9    :            :_________________:_______________________:            : Bile, Yellow    :          0.2 - 0.6    :            :_________________:_______________________:            : Lymph           :          2.2 - 6.0    :            :_________________:_______________________:            : Human Milk      :          1.9 - 2.0    :            :_________________: ______________________:            : Nasal Secretion :          0.1 - 3.5    :            :_________________:___________ ____________:            : Pancreatic      :          0.0 - 0.1    :            : Juice           :    (post stimulation) :            :_________________:_______________________:            :                 : Transudate:   <3.0    :            : Pleural Fluid   : Exudate:      >3.0    :             :_________________:_______________________:            : Saliva          :          0.1 - 0.2    :            : (Mixed Glands)  :                       :            :_________________:_______________________:            : Synovial Fluid  :               <2.5    :            :_________________:_______________________:            : Tears           :          0.8 - 0.9    :            :_________________:_______________________:             Bronwen ORN, Gwynn [place] Reference Intervals  for  Adults and Children 2008. Ninth             Edition (V9.1) [employer],             [place]; [place]: July 2009. Performed At: [facility] [address] [place], [place] [id number] Jennette Shorter MD Ey:[id number]    Source of Sample ASCITIC     Comment: Performed at [facility], [address], [place], [place] [address]  Glucose, Body Fluid Other     Status: None   Collection Time: 01/25/24  1:50 PM  Result Value Ref Range   Glucose, Body Fluid Other <2 mg/dL    Comment: (NOTE)             _________________________________________            : BODY FLUID TYPE :        GLUCOSE        :            :_________________:_______________________:            : Amniotic Fluid  :        45 - 76        :            :_________________:_______________________:            : Bile, Clear     :            < 5        :            :_________________:_______________________:            : Bile, Yellow    :            < 8        :            :_________________:_______________________:            : Lymph           :        48 - 200       :            :_________________:_______________________:            : Nasal Secretion :            < 10       :            :_________________:_______________________:            : Pleural Fluid   :        65 -  99       :            :_________________:_______________________:            : Saliva          :            <  2       :            : (Mixed  Glands)  :                       :            :_________________:___________ ____________:            : Sweat           :            <  7       :            :_________________:_______________________:            : Synovial Fluid  :        65 -  99       :            :_________________:_______________________:            : Tears           :        76 - 288       :            :_________________:_______________________:             Bronwen ORN, Ehrhardt V. Reference Intervals             for Adults and Children 2008. Ninth             edition (V9.1) [employer],             [place]; [place]: July 2009. **Verified by repeat analysis** Performed At: [facility] [address] [place], [place] [id number] Jennette Shorter MD Ey:[id number]    Source of Sample ASCITIC     Comment: Performed at [facility], [address] [place], [place] [address]  Gram stain     Status: None   Collection Time: 01/25/24  1:50 PM   Specimen: Ascitic  Result Value Ref Range   Specimen Description ASCITIC    Special Requests ASCITIC    Gram Stain      ASCITIC CYTOSPIN SMEAR WBC PRESENT, PREDOMINANTLY MONONUCLEAR NO ORGANISMS SEEN Performed at [facility], [address], [place], [place] [address]    Report Status 01/25/2024 FINAL   CBC with Differential/Platelet     Status: Abnormal   Collection Time: 01/25/24  5:59 PM  Result Value Ref Range   WBC 28.8 (H) 4.0 - 10.5 K/uL   RBC 3.36 (L) 4.22 - 5.81 MIL/uL   Hemoglobin 11.0 (L) 13.0 - 17.0 g/dL   HCT 66.9 (L) 60.9 - 47.9 %   MCV 98.2 80.0 - 100.0 fL   MCH 32.7 26.0 - 34.0 pg   MCHC 33.3 30.0 - 36.0 g/dL   RDW 84.0 (H) 88.4 - 84.4 %   Platelets 207 150 - 400 K/uL   nRBC 0.7 (H) 0.0 - 0.2 %   Neutrophils Relative % 66 %   Neutro Abs 20.2 (H) 1.7 - 7.7 K/uL   Band Neutrophils 4 %   Lymphocytes Relative 18 %   Lymphs Abs 5.2 (H) 0.7 - 4.0 K/uL   Monocytes Relative 3 %   Monocytes Absolute 0.9 0.1 - 1.0 K/uL   Eosinophils  Relative 1 %   Eosinophils Absolute 0.3 0.0 - 0.5 K/uL   Basophils Relative 0 %   Basophils Absolute 0.0 0.0 - 0.1 K/uL   WBC Morphology See Note     Comment: Abnormal lymphocytes present Moderate Left Shift (>5% metas and myelos) SMUDGE CELLS    RBC Morphology MORPHOLOGY UNREMARKABLE    Smear Review Normal platelet morphology    Metamyelocytes Relative 6 %   Myelocytes 2 %   Abs Immature Granulocytes 2.30 (H) 0.00 - 0.07 K/uL   Reactive, Benign Lymphocytes PRESENT     Comment: Performed at [facility], [address] [place], [place] [address]  Comprehensive metabolic panel     Status: Abnormal   Collection Time: 01/25/24  5:59  PM  Result Value Ref Range   Sodium 136 135 - 145 mmol/L    Comment: Electrolytes repeated to verify    Potassium 4.2 3.5 - 5.1 mmol/L   Chloride 95 (L) 98 - 111 mmol/L   CO2 15 (L) 22 - 32 mmol/L   Glucose, Bld 56 (L) 70 - 99 mg/dL    Comment: Glucose reference range applies only to samples taken after fasting for at least 8 hours.   BUN 66 (H) 8 - 23 mg/dL   Creatinine, Ser 7.11 (H) 0.61 - 1.24 mg/dL   Calcium  8.9 8.9 - 10.3 mg/dL   Total Protein 5.7 (L) 6.5 - 8.1 g/dL   Albumin  4.1 3.5 - 5.0 g/dL   AST 64 (H) 15 - 41 U/L   ALT 14 0 - 44 U/L   Alkaline Phosphatase 40 38 - 126 U/L   Total Bilirubin 0.6 0.0 - 1.2 mg/dL   GFR, Estimated 22 (L) >60 mL/min    Comment: (NOTE) Calculated using the CKD-EPI Creatinine Equation (2021)    Anion gap 26 (H) 5 - 15    Comment: Performed at [facility], [address] [place], [place] [address]  Uric acid     Status: Abnormal   Collection Time: 01/25/24  5:59 PM  Result Value Ref Range   Uric Acid, Serum >21.0 (H) 3.7 - 8.6 mg/dL    Comment: Performed at [facility], [address] [place], [place] [address]  Lactate dehydrogenase     Status: Abnormal   Collection Time: 01/25/24  5:59 PM  Result Value Ref Range   LDH 2,419 (H) 105 - 235 U/L    Comment: Performed at [facility], [address], [place], [place] [address]  Phosphorus     Status: None   Collection Time: 01/25/24  5:59 PM  Result Value Ref Range   Phosphorus 3.4 2.5 - 4.6 mg/dL    Comment: Performed at [facility], [address], [place], [place] [address]  Comprehensive metabolic panel     Status: Abnormal   Collection Time: 01/26/24  4:16 AM  Result Value Ref Range   Sodium 136 135 - 145 mmol/L    Comment: Electrolytes repeated to verify    Potassium 4.6 3.5 - 5.1 mmol/L   Chloride 96 (L) 98 - 111 mmol/L   CO2 14 (L) 22 - 32 mmol/L   Glucose, Bld 37 (LL) 70 - 99 mg/dL    Comment: Critical Value, Read Back and verified with  Critical Value, Read Back and verified with  stkins,m  at 6:14am  on  01/26/24 by festerman,c Glucose reference range applies only to samples taken after fasting for at least 8 hours.    BUN 72 (H) 8 - 23 mg/dL   Creatinine, Ser 6.66 (H) 0.61 - 1.24 mg/dL   Calcium  9.0 8.9 - 10.3 mg/dL   Total Protein 5.7 (L) 6.5 - 8.1 g/dL   Albumin  4.0 3.5 - 5.0 g/dL   AST 67 (H) 15 - 41 U/L   ALT 14 0 - 44 U/L   Alkaline Phosphatase 42 38 - 126 U/L   Total Bilirubin 0.5 0.0 - 1.2 mg/dL   GFR, Estimated 19 (L) >60 mL/min    Comment: (NOTE) Calculated using the CKD-EPI Creatinine Equation (2021)    Anion gap 27 (H) 5 - 15    Comment: Performed at [facility], [address], [place], [place] [address]  Uric acid     Status: Abnormal  Collection Time: 01/26/24  4:16 AM  Result Value Ref Range   Uric Acid, Serum >21.0 (H) 3.7 - 8.6 mg/dL    Comment: Performed at [facility], [address] [place], [place] [address]  Lactate dehydrogenase     Status: Abnormal   Collection Time: 01/26/24  4:16 AM  Result Value Ref Range   LDH >2,500 (H) 105 - 235 U/L    Comment: Performed at [facility], [address], [place], [place] [address]  Phosphorus     Status: None   Collection Time: 01/26/24  4:16 AM  Result Value Ref Range   Phosphorus 4.1 2.5 - 4.6 mg/dL    Comment: Performed at [facility], [address], [place], [place] [address]  CBC with Differential/Platelet     Status: Abnormal   Collection Time: 01/26/24  4:16 AM  Result Value Ref Range   WBC 38.6 (H) 4.0 - 10.5 K/uL   RBC 3.65 (L) 4.22 - 5.81 MIL/uL   Hemoglobin 12.1 (L) 13.0 - 17.0 g/dL   HCT 63.5 (L) 60.9 - 47.9 %   MCV 99.7 80.0 - 100.0 fL   MCH 33.2 26.0 - 34.0 pg   MCHC 33.2 30.0 - 36.0 g/dL   RDW 84.0 (H) 88.4 - 84.4 %   Platelets 231 150 - 400 K/uL   nRBC 0.7 (H) 0.0 - 0.2 %   Neutrophils Relative % 54 %   Neutro Abs 20.9 (H) 1.7 - 7.7 K/uL   Lymphocytes Relative 18 %   Lymphs Abs 7.1 (H) 0.7 - 4.0 K/uL   Monocytes Relative 14 %   Monocytes Absolute 5.2 (H) 0.1 - 1.0 K/uL   Eosinophils Relative 1 %   Eosinophils Absolute 0.4 0.0 - 0.5 K/uL   Basophils Relative 0 %   Basophils Absolute 0.2 (H) 0.0 - 0.1 K/uL   WBC Morphology See Note     Comment: Moderate Left Shift (>5% metas and myelos) SMUDGE CELLS    Smear Review Normal platelet morphology    Immature Granulocytes 13 %   Abs Immature Granulocytes 4.86 (H) 0.00 - 0.07 K/uL   Reactive, Benign Lymphocytes PRESENT    Tear Drop Cells PRESENT    Polychromasia PRESENT    Ovalocytes PRESENT     Comment: Performed at [facility], [address] [place], [place] [address]  Glucose, capillary     Status: Abnormal   Collection Time: 01/26/24  6:41 AM  Result Value Ref Range   Glucose-Capillary 40 (LL) 70 - 99 mg/dL    Comment: Glucose reference range applies only to samples taken after fasting for at least 8 hours.  Glucose, capillary     Status: Abnormal   Collection Time: 01/26/24  7:01 AM  Result Value Ref Range   Glucose-Capillary 47 (L) 70 - 99 mg/dL    Comment: Glucose reference range applies only to samples taken after fasting for at least 8 hours.  Glucose, capillary     Status: Abnormal   Collection Time: 01/26/24  7:18 AM  Result Value Ref Range   Glucose-Capillary 161 (H) 70 - 99 mg/dL    Comment: Glucose reference range  applies only to samples taken after fasting for at least 8 hours.  Glucose, capillary     Status: None   Collection Time: 01/26/24  8:14 AM  Result Value Ref Range   Glucose-Capillary 86 70 - 99 mg/dL    Comment: Glucose reference range applies only to samples taken after fasting for  at least 8 hours.  Glucose, capillary     Status: None   Collection Time: 01/26/24  9:17 AM  Result Value Ref Range   Glucose-Capillary 78 70 - 99 mg/dL    Comment: Glucose reference range applies only to samples taken after fasting for at least 8 hours.  Glucose, capillary     Status: Abnormal   Collection Time: 01/26/24  9:34 AM  Result Value Ref Range   Glucose-Capillary 69 (L) 70 - 99 mg/dL    Comment: Glucose reference range applies only to samples taken after fasting for at least 8 hours.  Glucose, capillary     Status: Abnormal   Collection Time: 01/26/24 10:20 AM  Result Value Ref Range   Glucose-Capillary 115 (H) 70 - 99 mg/dL    Comment: Glucose reference range applies only to samples taken after fasting for at least 8 hours.  Glucose, capillary     Status: Abnormal   Collection Time: 01/26/24 12:36 PM  Result Value Ref Range   Glucose-Capillary 64 (L) 70 - 99 mg/dL    Comment: Glucose reference range applies only to samples taken after fasting for at least 8 hours.  Glucose, capillary     Status: Abnormal   Collection Time: 01/26/24 12:59 PM  Result Value Ref Range   Glucose-Capillary 149 (H) 70 - 99 mg/dL    Comment: Glucose reference range applies only to samples taken after fasting for at least 8 hours.  Glucose, capillary     Status: None   Collection Time: 01/26/24  4:13 PM  Result Value Ref Range   Glucose-Capillary 80 70 - 99 mg/dL    Comment: Glucose reference range applies only to samples taken after fasting for at least 8 hours.  Glucose, capillary     Status: Abnormal   Collection Time: 01/26/24  5:31 PM  Result Value Ref Range   Glucose-Capillary 111 (H) 70 - 99 mg/dL     Comment: Glucose reference range applies only to samples taken after fasting for at least 8 hours.  Glucose, capillary     Status: None   Collection Time: 01/26/24  8:13 PM  Result Value Ref Range   Glucose-Capillary 80 70 - 99 mg/dL    Comment: Glucose reference range applies only to samples taken after fasting for at least 8 hours.  Glucose, capillary     Status: None   Collection Time: 01/26/24 11:57 PM  Result Value Ref Range   Glucose-Capillary 91 70 - 99 mg/dL    Comment: Glucose reference range applies only to samples taken after fasting for at least 8 hours.  Glucose, capillary     Status: None   Collection Time: 01/27/24  4:07 AM  Result Value Ref Range   Glucose-Capillary 77 70 - 99 mg/dL    Comment: Glucose reference range applies only to samples taken after fasting for at least 8 hours.  Glucose, capillary     Status: None   Collection Time: 01/27/24  4:30 AM  Result Value Ref Range   Glucose-Capillary 73 70 - 99 mg/dL    Comment: Glucose reference range applies only to samples taken after fasting for at least 8 hours.  Uric acid     Status: Abnormal   Collection Time: 01/27/24  4:49 AM  Result Value Ref Range   Uric Acid, Serum >21.0 (H) 3.7 - 8.6 mg/dL    Comment: Performed at [facility], [address] [place], [place] [address]  CBC with Differential/Platelet  Status: Abnormal (Preliminary result)   Collection Time: 01/27/24  4:50 AM  Result Value Ref Range   WBC 41.3 (H) 4.0 - 10.5 K/uL   RBC 3.96 (L) 4.22 - 5.81 MIL/uL   Hemoglobin 12.8 (L) 13.0 - 17.0 g/dL   HCT 60.8 60.9 - 47.9 %   MCV 98.7 80.0 - 100.0 fL   MCH 32.3 26.0 - 34.0 pg   MCHC 32.7 30.0 - 36.0 g/dL   RDW 84.1 (H) 88.4 - 84.4 %   Platelets 194 150 - 400 K/uL   nRBC 0.7 (H) 0.0 - 0.2 %    Comment: Performed at [facility], [address] [place], [place] [address]   Neutrophils Relative % PENDING %   Neutro Abs PENDING 1.7 - 7.7 K/uL   Band Neutrophils PENDING %   Lymphocytes  Relative PENDING %   Lymphs Abs PENDING 0.7 - 4.0 K/uL   Monocytes Relative PENDING %   Monocytes Absolute PENDING 0.1 - 1.0 K/uL   Eosinophils Relative PENDING %   Eosinophils Absolute PENDING 0.0 - 0.5 K/uL   Basophils Relative PENDING %   Basophils Absolute PENDING 0.0 - 0.1 K/uL   WBC Morphology PENDING    RBC Morphology PENDING    Smear Review PENDING    Other PENDING %   nRBC PENDING 0 /100 WBC   Metamyelocytes Relative PENDING %   Myelocytes PENDING %   Promyelocytes Relative PENDING %   Blasts PENDING %   Immature Granulocytes PENDING %   Abs Immature Granulocytes PENDING 0.00 - 0.07 K/uL  Comprehensive metabolic panel     Status: Abnormal   Collection Time: 01/27/24  4:50 AM  Result Value Ref Range   Sodium 137 135 - 145 mmol/L    Comment: Electrolytes repeated to verify    Potassium 4.8 3.5 - 5.1 mmol/L   Chloride 96 (L) 98 - 111 mmol/L   CO2 19 (L) 22 - 32 mmol/L   Glucose, Bld 59 (L) 70 - 99 mg/dL    Comment: Glucose reference range applies only to samples taken after fasting for at least 8 hours.   BUN 80 (H) 8 - 23 mg/dL   Creatinine, Ser 5.70 (H) 0.61 - 1.24 mg/dL   Calcium  8.7 (L) 8.9 - 10.3 mg/dL   Total Protein 5.3 (L) 6.5 - 8.1 g/dL   Albumin  3.6 3.5 - 5.0 g/dL   AST 83 (H) 15 - 41 U/L    Comment: HEMOLYSIS AT THIS LEVEL MAY AFFECT RESULT   ALT 16 0 - 44 U/L   Alkaline Phosphatase 45 38 - 126 U/L   Total Bilirubin 0.4 0.0 - 1.2 mg/dL   GFR, Estimated 14 (L) >60 mL/min    Comment: (NOTE) Calculated using the CKD-EPI Creatinine Equation (2021)    Anion gap 22 (H) 5 - 15    Comment: Performed at [facility], [address] [place], [place] [address]  Lactate dehydrogenase     Status: Abnormal   Collection Time: 01/27/24  4:50 AM  Result Value Ref Range   LDH >2,500 (H) 105 - 235 U/L    Comment: HEMOLYSIS AT THIS LEVEL MAY AFFECT RESULT Performed at [facility], [address], [place], [place] [address]   Phosphorus     Status: None   Collection  Time: 01/27/24  4:50 AM  Result Value Ref Range   Phosphorus 4.6 2.5 - 4.6 mg/dL    Comment: Performed at [facility], [address], [place], [place] [address]  Magnesium      Status: Abnormal   Collection Time: 01/27/24  4:50 AM  Result Value Ref Range   Magnesium  2.6 (H) 1.7 - 2.4 mg/dL    Comment: Performed at [facility], [address] [place], [place] [address]  Glucose, capillary     Status: None   Collection Time: 01/27/24  6:22 AM  Result Value Ref Range   Glucose-Capillary 70 70 - 99 mg/dL    Comment: Glucose reference range applies only to samples taken after fasting for at least 8 hours.  Glucose, capillary     Status: Abnormal   Collection Time: 01/27/24  7:32 AM  Result Value Ref Range   Glucose-Capillary 69 (L) 70 - 99 mg/dL    Comment: Glucose reference range applies only to samples taken after fasting for at least 8 hours.  Glucose, capillary     Status: None   Collection Time: 01/27/24  8:22 AM  Result Value Ref Range   Glucose-Capillary 87 70 - 99 mg/dL    Comment: Glucose reference range applies only to samples taken after fasting for at least 8 hours.    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 Request for a bone marrow biopsy. EXAM: FLUOROSCOPIC GUIDED BONE MARROW ASPIRATES AND BIOPSY Physician: Juliene SAUNDERS. Philip, MD MEDICATIONS: 1% lidocaine  for local anesthetic ANESTHESIA/SEDATION: Patient's level of consciousness and vital signs were monitored continuously by radiology nurse throughout the procedure under the supervision of the provider performing the procedure. FLUOROSCOPY: Radiation Exposure Index (as provided by the fluoroscopic device): 13.7 mGy Kerma COMPLICATIONS: None immediate. PROCEDURE: The procedure was explained to the patient. The risks and benefits of the procedure were discussed and the patient's questions were addressed. Informed consent was obtained from the patient. The  patient was placed prone on interventional table. The back was prepped and draped in sterile fashion. Maximal barrier sterile technique was utilized including caps, mask, sterile gowns, sterile gloves, sterile drape, hand hygiene and skin antiseptic. The skin and right posterior ilium were anesthetized with 1% lidocaine . 11 gauge bone needle was directed into the right ilium with fluoroscopic guidance. Two aspirates and 1 core biopsy were obtained. Bandage placed over the puncture site. Fluoroscopic image saved for documentation. IMPRESSION: Fluoroscopic guided bone marrow aspiration and core biopsy. Electronically Signed   By: Juliene Philip M.D.   On: 01/26/2024 21:01   US  Paracentesis Result Date: 01/25/2024 INDICATION: 73 year old male. History of B-cell lymphoma. Endorsing abdominal pain. Found to have ascites. Request is for therapeutic and diagnostic paracentesis. EXAM: ULTRASOUND GUIDED THERAPEUTIC AND DIAGNOSTIC PARACENTESIS MEDICATIONS: Lidocaine  1% 10 mL COMPLICATIONS: None immediate. PROCEDURE: Informed written consent was obtained from the patient after a discussion of the risks, benefits and alternatives to treatment. A timeout was performed prior to the initiation of the procedure. Initial ultrasound scanning demonstrates a moderate amount of ascites within the right lower abdominal quadrant. The right lower abdomen was prepped and draped in the usual sterile fashion. 1% lidocaine  was used for local anesthesia. Following this, a 19 gauge, 7-cm, Yueh catheter was introduced. An ultrasound image was saved for documentation purposes. The paracentesis was performed. The catheter was removed and a dressing was applied. The patient tolerated the procedure well without immediate post procedural complication. Patient received post-procedure intravenous albumin ; see nursing notes for details. FINDINGS: A total of approximately 5.5 L of hazy tan colored fluid was removed. Samples were sent to the laboratory as  requested by the clinical team.  IMPRESSION: Successful ultrasound-guided therapeutic and diagnostic paracentesis yielding 5.5 liters of hazy tan-colored peritoneal fluid. Performed by Delon Beagle NP Electronically Signed   By: Ester Sides M.D.   On: 01/25/2024 15:42    Assessment/PlanFrank Botts Bryant is an 73 y.o. male HTN, hypothyroidism, h/o DLBCL 2017, back pain who nephrology is consulted regarding evaluation and management of AKI.   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  Onc here d/w family - options hospice vs transfer [facility] for possible treatment and family discussing.   **AKI, severe:  normal function 10/2023 now with severe AKI presumably oliguric. Suspect multifactorial with contributions of prerenal likely progressed to ATN, TLS with uric acid > 21.  Failed fluid challenge and has marked ascites and peripheral edema - hold on IVF today.   On allopurinol  and rasburicase  per onc for TLS - agree.  Discussed with family the acuity and severity of his AKI which likely will continue to worsen in coming days.  If he proceeds with chemo I suspect he'll need HD support which we discussed today.    **TLS:  LDH and uric acid very elevated, thankfully K/Ca ok at this time. Being treated by onc as noted above.  BID labs being done.   **HTN: currently not hypertensive and meds on hold, getting midodrine .   **Epidural hematoma s/p fall: conservative management.   Will follow.  D/w sister and sons bedside today.   Manuelita DELENA Barters 01/27/2024, 9:16 AM       [1]  Allergies Allergen Reactions   Flomax [Tamsulosin Hcl] Swelling    congestion

## 2024-01-27 NOTE — Consult Note (Signed)
 Hematology/oncology progress note  Reviewed patient's chart today.  Again, labs are highly suspicious for Burkitt's lymphoma.  Dr. Kandala has arranged for transfer of patient from [facility] to [facility] for treatment.  Is for transfer as soon as a bed is available.  Patient met with palliative care today and requested oncology to swing by.  Patient should transfer out in about 1 hour.  Reviewed labs from 01/27/2024.  Spoke with patient's sister today.  She is very concerned about the rapid decline of her brother.  Reports somebody has been around-the-clock with him due to irritability and trying to get out of bed.  Review of Systems  Constitutional:  Positive for malaise/fatigue.  Gastrointestinal:  Positive for abdominal pain and diarrhea.  Neurological:  Positive for dizziness and weakness.   Physical Exam Constitutional:      Appearance: He is ill-appearing.  Abdominal:     General: There is distension.     Patient and family know they are able to call clinic with any questions.  [facility], AGNP-C Department of Hematology/Oncology [facility]  Phone: [phone]  01/27/2024 3:54 PM

## 2024-01-27 NOTE — Plan of Care (Signed)
  Problem: Coping: Goal: Level of anxiety will decrease Outcome: Not Progressing   Problem: Pain Managment: Goal: General experience of comfort will improve and/or be controlled Outcome: Not Progressing   Problem: Skin Integrity: Goal: Risk for impaired skin integrity will decrease Outcome: Progressing

## 2024-01-28 DIAGNOSIS — R627 Adult failure to thrive: Secondary | ICD-10-CM | POA: Diagnosis present

## 2024-01-28 DIAGNOSIS — G893 Neoplasm related pain (acute) (chronic): Secondary | ICD-10-CM | POA: Diagnosis present

## 2024-01-28 DIAGNOSIS — R109 Unspecified abdominal pain: Secondary | ICD-10-CM | POA: Diagnosis present

## 2024-01-28 DIAGNOSIS — C786 Secondary malignant neoplasm of retroperitoneum and peritoneum: Secondary | ICD-10-CM | POA: Diagnosis present

## 2024-01-28 DIAGNOSIS — R1900 Intra-abdominal and pelvic swelling, mass and lump, unspecified site: Secondary | ICD-10-CM | POA: Diagnosis present

## 2024-01-28 DIAGNOSIS — C8370 Burkitt lymphoma, unspecified site: Secondary | ICD-10-CM | POA: Diagnosis present

## 2024-01-28 DEATH — deceased

## 2024-01-29 LAB — SURGICAL PATHOLOGY

## 2024-01-29 LAB — CYTOLOGY - NON PAP

## 2024-01-29 LAB — FLOW CYTOMETRY

## 2024-01-30 LAB — CULTURE, BODY FLUID W GRAM STAIN -BOTTLE: Culture: NO GROWTH

## 2024-02-01 ENCOUNTER — Encounter (HOSPITAL_COMMUNITY): Payer: Self-pay

## 2024-02-01 ENCOUNTER — Encounter (HOSPITAL_COMMUNITY): Payer: Self-pay | Admitting: Internal Medicine

## 2024-02-02 ENCOUNTER — Inpatient Hospital Stay: Payer: Medicare (Managed Care) | Admitting: Oncology

## 2024-02-02 ENCOUNTER — Inpatient Hospital Stay: Payer: Medicare (Managed Care)

## 2024-02-02 LAB — FLOW CYTOMETRY REQUEST - FLUID (INPATIENT)

## 2024-02-03 ENCOUNTER — Encounter (HOSPITAL_COMMUNITY): Payer: Self-pay

## 2024-02-28 NOTE — Progress Notes (Signed)
 RN Brittany Super pronounced pts demise.  Time of Death: 0420 on February 21, 2024 This nurse was at bedside with Laymon Solum, RN.  Charge Nurse Harlene Griffin was made aware.  Pts ex-wife was at bedside.   Post mortem protocol was implemented.

## 2024-02-28 NOTE — Death Summary Note (Signed)
 " DEATH SUMMARY   Patient Details  Name: Douglas Bryant MRN: [id number] DOB: May 22, 1950  Admission/Discharge Information   Admit Date:  02/09/2024  Date of Death: Date of Death: Feb 15, 2024  Time of Death: Time of Death: 0420  Length of Stay: 6  Referring Physician: Maree Isles, MD   Reason(s) for Hospitalization  74 y.o. male with medical history significant of hypertension, hypothyroidism, diffuse large B-cell lymphoma who presents to the emergency department due to several weeks of worsening abdominal pain with associated decreased oral intake and generalized weakness including 2  falls prior to presenting to [facility] ED on 01/18/2024 with a new abdominal mass which was concerning for malignancy and compatible with peritoneal carcinomatosis/lymphoma with moderate volume ascites.  He was diagnosed with acute kidney injury and UTI and discharged with Keflex.  He complained of worsening weakness with difficulty in being able to ambulate with a walker and complain of nausea without vomiting.  He has an appointment with oncologist on 12/31, but patient does not think he can wait till 31st, so he presents to the ED for further evaluation.   ED course In the emergency department, he was hemodynamically stable.  Workup in ED showed leukocytosis 12.3, hemoglobin 13.2, hematocrit 30.0, MCV 93.3, platelets 436.  BMP showed sodium 127, potassium 4.6, chloride 85, bicarb 17, blood glucose 81, BUN 56, creatinine 2.91 (baseline creatinine at 0.7-0.8). CT abdomen and pelvis showed large infiltrative right paraduodenal space mass (at least 6.4 x 14.6 cm). Extensive omental caking and peritoneal thickening consistent with peritoneal carcinomatosis, with  large volume ascites. IV morphine  4 mg x 1 was given, Zofran  was given.  IV hydration was provided.  TRH was asked admit patient.    Diagnoses  Preliminary cause of death: Burkitt's Lymphoma  Secondary Diagnoses (including complications and co-morbidities):   Principal Problem:   Subdural hematoma (HCC) Active Problems:   Burkitt lymphoma   Abdominal mass   Peritoneal carcinomatosis   Obsessive-compulsive disorder   DLBCL (diffuse large B cell lymphoma) (HCC)   S/P lumbar fusion   Failure to thrive in adult   Abdominal pain   Cancer associated pain   Cancer-related breakthrough pain   Brief Hospital Course (including significant findings, care, treatment, and services provided and events leading to death)   Abdominal mass favoring lymphoma History of diffuse large B-cell lymphoma Abdominal distention with ascites/lymphoma Anemia Leukocytosis CT abdomen and pelvis shows a large mass in the periduodenal space.  Differential includes lymphoma versus primary pancreatic or colonic malignancy.  There is a concern for new mesenteric mass.  Discussed with IR, unable to biopsy given ascites.  Hematology team has been consulted. -Paracentesis, send out cytology.  Status post paracentesis 5.5 L removed 12/29.  S/p albumin  --Pt has been accepted to transfer to [facility] to begin chemotherapy for presumed Burkitt's lymphoma.  --patient and family ultimately decided late day on 01/27/24 to cancel transfer to [facility] and to pursue full comfort care measures, confirmed by palliative care provider.     AKI Dehydration Baseline creatinine 0.8 in October 2025, continues to trend upwards.  Admission creatinine 2.9 > 3.33.  Getting IV fluids - Foley catheter insertion - may require temporary HD treatment given need to start chemotherapy      Epidural hematoma Initial CT head showed 7 mm epidural hematoma case was discussed with neurosurgery Dr. Malcolm by previous provider who recommended repeating the study which was repeated on 12/27.  Repeat study is stable    Starvation ketoacidosis with intermittent  hypoglycemia Getting D5 fluids.  As needed glucagon    Hyponatremia Resolved with IV fluids -Held HCTZ    Acquired  hypothyroidism -treated with levothyroxine    Essential hypertension (controlled) Soft blood pressure -Held home medication, midodrine  p.o.   Pertinent Labs and Studies  Significant Diagnostic Studies IR BONE MARROW BIOPSY & ASPIRATION Result Date: 01/26/2024 INDICATION: Leukocytosis and concern for Burkitt's lymphoma. Request for a bone marrow biopsy. EXAM: FLUOROSCOPIC GUIDED BONE MARROW ASPIRATES AND BIOPSY Physician: Juliene SAUNDERS. Philip, MD MEDICATIONS: 1% lidocaine  for local anesthetic ANESTHESIA/SEDATION: Patient's level of consciousness and vital signs were monitored continuously by radiology nurse throughout the procedure under the supervision of the provider performing the procedure. FLUOROSCOPY: Radiation Exposure Index (as provided by the fluoroscopic device): 13.7 mGy Kerma COMPLICATIONS: None immediate. PROCEDURE: The procedure was explained to the patient. The risks and benefits of the procedure were discussed and the patient's questions were addressed. Informed consent was obtained from the patient. The patient was placed prone on interventional table. The back was prepped and draped in sterile fashion. Maximal barrier sterile technique was utilized including caps, mask, sterile gowns, sterile gloves, sterile drape, hand hygiene and skin antiseptic. The skin and right posterior ilium were anesthetized with 1% lidocaine . 11 gauge bone needle was directed into the right ilium with fluoroscopic guidance. Two aspirates and 1 core biopsy were obtained. Bandage placed over the puncture site. Fluoroscopic image saved for documentation. IMPRESSION: Fluoroscopic guided bone marrow aspiration and core biopsy. Electronically Signed   By: Juliene Philip M.D.   On: 01/26/2024 21:01   US  Paracentesis Result Date: 01/25/2024 INDICATION: 73 year old male. History of B-cell lymphoma. Endorsing abdominal pain. Found to have ascites. Request is for therapeutic and diagnostic paracentesis. EXAM: ULTRASOUND GUIDED  THERAPEUTIC AND DIAGNOSTIC PARACENTESIS MEDICATIONS: Lidocaine  1% 10 mL COMPLICATIONS: None immediate. PROCEDURE: Informed written consent was obtained from the patient after a discussion of the risks, benefits and alternatives to treatment. A timeout was performed prior to the initiation of the procedure. Initial ultrasound scanning demonstrates a moderate amount of ascites within the right lower abdominal quadrant. The right lower abdomen was prepped and draped in the usual sterile fashion. 1% lidocaine  was used for local anesthesia. Following this, a 19 gauge, 7-cm, Yueh catheter was introduced. An ultrasound image was saved for documentation purposes. The paracentesis was performed. The catheter was removed and a dressing was applied. The patient tolerated the procedure well without immediate post procedural complication. Patient received post-procedure intravenous albumin ; see nursing notes for details. FINDINGS: A total of approximately 5.5 L of hazy tan colored fluid was removed. Samples were sent to the laboratory as requested by the clinical team. IMPRESSION: Successful ultrasound-guided therapeutic and diagnostic paracentesis yielding 5.5 liters of hazy tan-colored peritoneal fluid. Performed by Delon Beagle NP Electronically Signed   By: Ester Sides M.D.   On: 01/25/2024 15:42   CT Head Wo Contrast Result Date: 01/23/2024 EXAM: CT HEAD WITHOUT CONTRAST 01/23/2024 12:22:11 AM TECHNIQUE: CT of the head was performed without the administration of intravenous contrast. Automated exposure control, iterative reconstruction, and/or weight based adjustment of the mA/kV was utilized to reduce the radiation dose to as low as reasonably achievable. COMPARISON: CT head 01/22/2024. CLINICAL HISTORY: Epidural. Repeat in 6 hours from initial scan FINDINGS: BRAIN AND VENTRICLES: Unchanged mildly hyperdense extra-axial 7 mm thick collection along the left frontal convexity, probably epidural. No evidence of  acute infarct. No hydrocephalus. No midline shift. ORBITS: No acute abnormality. SINUSES: No acute abnormality. SOFT TISSUES AND SKULL: No skull  fracture. IMPRESSION: 1. Unchanged 7 mm thick extra-axial (probably epidural) hemorrhage along the left frontal convexity. Electronically signed by: Gilmore Molt MD 01/23/2024 12:41 AM EST RP Workstation: [id number]   CT ABDOMEN PELVIS WO CONTRAST Result Date: 01/22/2024 EXAM: CT ABDOMEN AND PELVIS WITHOUT CONTRAST 01/22/2024 06:05:34 PM TECHNIQUE: CT of the abdomen and pelvis was performed without the administration of intravenous contrast. Multiplanar reformatted images are provided for review. Automated exposure control, iterative reconstruction, and/or weight-based adjustment of the mA/kV was utilized to reduce the radiation dose to as low as reasonably achievable. COMPARISON: PET/CT examination of 11/28/2013. CLINICAL HISTORY: Mass, worsening pain. *tracking code: Bo*. Lymphoma. FINDINGS: LOWER CHEST: The lung bases are clear. Scattered atelectasis. Extensive coronary artery calcifications. LIVER: The liver is unremarkable. GALLBLADDER AND BILE DUCTS: Gallbladder is unremarkable. No biliary ductal dilatation. SPLEEN: The spleen is of normal size and is unremarkable in this noncontrast examination. PANCREAS: The head and uncinate process of the pancreas are involved by a large, infiltrative soft tissue mass centered within the right paraduodenal space, measuring at least 6.4 x 14.6 cm. The mass encases the distal second and proximal third portion of the duodenum, encases the proximal ascending colon, extending inferiorly to the level of the ileocecal junction, and is indistinct from the head and uncinate process of the pancreas. Despite complete encasement of the second portion of the duodenum, this structure is nonobstructed. Similarly, the ascending colon demonstrates no evidence of obstruction. Among the differential considerations, lymphoma should be strongly  considered. Additional considerations could include a primary colonic malignancy, or less likely, a primary pancreatic malignancy. The body and tail of the pancreas are unremarkable. ADRENAL GLANDS: No acute abnormality. KIDNEYS, URETERS AND BLADDER: No stones in the kidneys or ureters. No hydronephrosis. No perinephric or periureteral stranding. Urinary bladder is unremarkable. GI AND BOWEL: Stomach demonstrates no acute abnormality. The distal second and proximal third portion of the duodenum and the proximal ascending colon are encased by the previously described mass, extending inferiorly to the level of the ileocecal junction. Despite complete encasement of the second portion of the duodenum, this structure is nonobstructed. Similarly, the ascending colon demonstrates no evidence of obstruction. There is no bowel obstruction. PERITONEUM AND RETROPERITONEUM: Extensive omental caking and peritoneal thickening, best appreciated inferiorly, in keeping with peritoneal carcinomatosis. Large volume ascites. No free air. VASCULATURE: Aorta is normal in caliber. Mild aortoiliac atherosclerotic calcification. No aortic aneurysm. LYMPH NODES: Pathologic mesenteric and celiac adenopathy appears confluent with the primary mass. Shotty retroperitoneal adenopathy is present within the aortocaval and periaortic lymph node groups. REPRODUCTIVE ORGANS: The prostate gland is mildly enlarged. BONES AND SOFT TISSUES: Surgical changes of L3-4 anterior and posterior lumbar fusion with instrumentation are noted. No suspicious lytic or blastic bone lesion. Stable lytic lesion within the right ilium, likely benign given stability over time. Small fat and fluid containing left inguinal hernia. IMPRESSION: 1. Large infiltrative right paraduodenal space mass (at least 6.4 x 14.6 cm) encasing the distal second and proximal third duodenum and proximal ascending colon, and indistinguishable from the pancreatic head/uncinate process, without  bowel obstruction; among the differential considerations, lymphoma is favored, with additional considerations less likely including primary colonic malignancy or primary pancreatic malignancy. PET CT is recommended for further evaluation and to guide biopsy strategy. 2. Extensive omental caking and peritoneal thickening consistent with peritoneal carcinomatosis, with large volume ascites. 3. Pathologic mesenteric and celiac adenopathy confluent with the primary mass, with additional shotty retroperitoneal adenopathy. 4. Extensive coronary artery calcifications. 5. Postsurgical changes of L3-4 anterior  and posterior lumbar fusion with instrumentation. 6. Stable lytic lesion in the right ilium, likely benign given stability over time. 7. Mild prostatomegaly. 8. Small fat and fluid containing left inguinal hernia. 9. RAF score includes Aortic atherosclerosis (ICD10-I70.0). Electronically signed by: Dorethia Molt MD 01/22/2024 07:29 PM EST RP Workstation: [id number]   CT Cervical Spine Wo Contrast Result Date: 01/22/2024 EXAM: CT CERVICAL SPINE WITHOUT CONTRAST 01/22/2024 06:05:34 PM TECHNIQUE: CT of the cervical spine was performed without the administration of intravenous contrast. Multiplanar reformatted images are provided for review. Automated exposure control, iterative reconstruction, and/or weight based adjustment of the mA/kV was utilized to reduce the radiation dose to as low as reasonably achievable. COMPARISON: None available. CLINICAL HISTORY: Neck trauma (Age >= 65y) FINDINGS: BONES AND ALIGNMENT: No acute fracture or traumatic malalignment. DEGENERATIVE CHANGES: Right C4-C5 facet arthropathy. SOFT TISSUES: No prevertebral soft tissue swelling. IMPRESSION: 1. No significant abnormality Electronically signed by: Gilmore Molt 01/22/2024 07:17 PM EST RP Workstation: [id number]   CT Head Wo Contrast Addendum Date: 01/22/2024  ADDENDUM #1  ADDENDUM: These findings were discussed with LYNWOOD LUKES  TEMPLETON PA by Dr. Margarite over the phone on 01/22/2024 at 6:33 pm . ---------------------------------------------------- Electronically signed by: Morgane Naveau MD 01/22/2024 06:34 PM EST RP Workstation: [id number]   Result Date: 01/22/2024  EXAM: CT HEAD WITHOUT CONTRAST 01/22/2024 06:05:34 PM TECHNIQUE: CT of the head was performed without the administration of intravenous contrast. Automated exposure control, iterative reconstruction, and/or weight based adjustment of the mA/kV was utilized to reduce the radiation dose to as low as reasonably achievable. COMPARISON: None available. CLINICAL HISTORY: Head trauma, minor (Age >= 65y) FINDINGS: BRAIN AND VENTRICLES: Acute 7 mm extra-axial hemorrhage along left frontal convexity, probably epidural but possibly subdural. Mild mass effect. No midline shift. No evidence of acute infarct. No hydrocephalus. Atherosclerotic calcifications are present within the cavernous internal carotid and vertebral arteries. ORBITS: Bilateral lens replacement. SINUSES: No acute abnormality. SOFT TISSUES AND SKULL: No acute soft tissue abnormality. No skull fracture. IMPRESSION: 1. Acute left 7 mm extra-axial hemorrhage - probably epidural but possibly subdural, with mild mass effect and no midline shift. Electronically signed by: Kate Margarite MD 01/22/2024 06:23 PM EST RP Workstation: [id number]    Microbiology Recent Results (from the past 240 hours)  Culture, body fluid w Gram Stain-bottle     Status: None (Preliminary result)   Collection Time: 01/25/24  1:50 PM   Specimen: Ascitic  Result Value Ref Range Status   Specimen Description ASCITIC BOTTLES DRAWN AEROBIC AND ANAEROBIC  Final   Special Requests 10CC  Final   Culture   Final    NO GROWTH 3 DAYS Performed at [facility], [address], [place], [place] [address]    Report Status PENDING  Incomplete  Gram stain     Status: None   Collection Time: 01/25/24  1:50 PM   Specimen: Ascitic  Result Value Ref  Range Status   Specimen Description ASCITIC  Final   Special Requests ASCITIC  Final   Gram Stain   Final    ASCITIC CYTOSPIN SMEAR WBC PRESENT, PREDOMINANTLY MONONUCLEAR NO ORGANISMS SEEN Performed at [facility], [address] [place], [place] [address]    Report Status 01/25/2024 FINAL  Final    Lab Basic Metabolic Panel: Recent Labs  Lab 01/23/24 0602 01/24/24 0445 01/25/24 0502 01/25/24 1759 01/26/24 0416 01/27/24 0450  NA 131* 133* 135 136 136 137  K 4.6 3.9 3.9 4.2 4.6 4.8  CL 88* 92* 95* 95*  96* 96*  CO2 23 21* 21* 15* 14* 19*  GLUCOSE 71 68* 73 56* 37* 59*  BUN 59* 59* 60* 66* 72* 80*  CREATININE 3.00* 2.68* 2.51* 2.88* 3.33* 4.29*  CALCIUM  9.1 8.9 8.8* 8.9 9.0 8.7*  MG 2.6* 2.5* 2.5*  --   --  2.6*  PHOS 4.0  --   --  3.4 4.1 4.6   Liver Function Tests: Recent Labs  Lab 01/24/24 0445 01/25/24 0502 01/25/24 1759 01/26/24 0416 01/27/24 0450  AST 75* 71* 64* 67* 83*  ALT 17 16 14 14 16   ALKPHOS 46 45 40 42 45  BILITOT 0.4 0.4 0.6 0.5 0.4  PROT 6.0* 5.6* 5.7* 5.7* 5.3*  ALBUMIN  3.9 3.7 4.1 4.0 3.6   Recent Labs  Lab 01/22/24 1252  LIPASE 31   No results for input(s): AMMONIA in the last 168 hours. CBC: Recent Labs  Lab 01/24/24 0445 01/24/24 1302 01/25/24 1759 01/26/24 0416 01/27/24 0450  WBC 31.6* 30.0* 28.8* 38.6* 41.3*  NEUTROABS 22.4* 21.0* 20.2* 20.9* 26.8*  HGB 11.8* 11.7* 11.0* 12.1* 12.8*  HCT 34.9* 34.0* 33.0* 36.4* 39.1  MCV 96.7 96.6 98.2 99.7 98.7  PLT 325 286 207 231 194   Cardiac Enzymes: Recent Labs  Lab 01/22/24 1252  CKTOTAL 224   Sepsis Labs: Recent Labs  Lab 01/24/24 1302 01/25/24 1759 01/26/24 0416 01/27/24 0450  WBC 30.0* 28.8* 38.6* 41.3*    Procedures/Operations   Octavis Sheeler 02/16/2024, 7:35 AM   "

## 2024-02-28 NOTE — Progress Notes (Signed)
 Time of Death: 20 on February 20, 2024.   Family at bedside. Primary nurse Melanie at bedside and charge RN Harlene aware.  [place] RN

## 2024-02-28 DEATH — deceased

## 2024-11-24 ENCOUNTER — Inpatient Hospital Stay: Payer: Medicare (Managed Care)

## 2024-11-24 ENCOUNTER — Inpatient Hospital Stay: Payer: Medicare (Managed Care) | Admitting: Oncology
